# Patient Record
Sex: Female | Born: 1965
Health system: Southern US, Community
[De-identification: ages and names within clinical notes are randomized; demographics above are authoritative.]

## PROBLEM LIST (undated history)

## (undated) DIAGNOSIS — M25569 Pain in unspecified knee: Secondary | ICD-10-CM

## (undated) DIAGNOSIS — R0602 Shortness of breath: Secondary | ICD-10-CM

## (undated) DIAGNOSIS — K219 Gastro-esophageal reflux disease without esophagitis: Secondary | ICD-10-CM

## (undated) DIAGNOSIS — C4491 Basal cell carcinoma of skin, unspecified: Secondary | ICD-10-CM

## (undated) DIAGNOSIS — G4730 Sleep apnea, unspecified: Secondary | ICD-10-CM

## (undated) DIAGNOSIS — R600 Localized edema: Secondary | ICD-10-CM

## (undated) DIAGNOSIS — F419 Anxiety disorder, unspecified: Secondary | ICD-10-CM

## (undated) DIAGNOSIS — R011 Cardiac murmur, unspecified: Secondary | ICD-10-CM

## (undated) DIAGNOSIS — Z5189 Encounter for other specified aftercare: Secondary | ICD-10-CM

## (undated) DIAGNOSIS — D509 Iron deficiency anemia, unspecified: Secondary | ICD-10-CM

## (undated) DIAGNOSIS — I10 Essential (primary) hypertension: Secondary | ICD-10-CM

## (undated) DIAGNOSIS — E785 Hyperlipidemia, unspecified: Secondary | ICD-10-CM

## (undated) DIAGNOSIS — E559 Vitamin D deficiency, unspecified: Secondary | ICD-10-CM

## (undated) DIAGNOSIS — R569 Unspecified convulsions: Secondary | ICD-10-CM

## (undated) DIAGNOSIS — M549 Dorsalgia, unspecified: Secondary | ICD-10-CM

## (undated) DIAGNOSIS — C439 Malignant melanoma of skin, unspecified: Secondary | ICD-10-CM

## (undated) DIAGNOSIS — M2550 Pain in unspecified joint: Secondary | ICD-10-CM

## (undated) DIAGNOSIS — G2581 Restless legs syndrome: Secondary | ICD-10-CM

## (undated) DIAGNOSIS — E669 Obesity, unspecified: Secondary | ICD-10-CM

## (undated) DIAGNOSIS — M1990 Unspecified osteoarthritis, unspecified site: Secondary | ICD-10-CM

## (undated) DIAGNOSIS — T7840XA Allergy, unspecified, initial encounter: Secondary | ICD-10-CM

## (undated) DIAGNOSIS — K760 Fatty (change of) liver, not elsewhere classified: Secondary | ICD-10-CM

## (undated) DIAGNOSIS — F32A Depression, unspecified: Secondary | ICD-10-CM

## (undated) DIAGNOSIS — D649 Anemia, unspecified: Secondary | ICD-10-CM

## (undated) DIAGNOSIS — K829 Disease of gallbladder, unspecified: Secondary | ICD-10-CM

## (undated) DIAGNOSIS — R079 Chest pain, unspecified: Secondary | ICD-10-CM

## (undated) DIAGNOSIS — J45909 Unspecified asthma, uncomplicated: Secondary | ICD-10-CM

## (undated) DIAGNOSIS — F329 Major depressive disorder, single episode, unspecified: Secondary | ICD-10-CM

## (undated) HISTORY — PX: OTHER SURGICAL HISTORY: SHX169

## (undated) HISTORY — DX: Allergy, unspecified, initial encounter: T78.40XA

## (undated) HISTORY — DX: Pain in unspecified joint: M25.50

## (undated) HISTORY — PX: TONSILLECTOMY AND ADENOIDECTOMY: SUR1326

## (undated) HISTORY — DX: Depression, unspecified: F32.A

## (undated) HISTORY — DX: Cardiac murmur, unspecified: R01.1

## (undated) HISTORY — DX: Disease of gallbladder, unspecified: K82.9

## (undated) HISTORY — DX: Major depressive disorder, single episode, unspecified: F32.9

## (undated) HISTORY — DX: Chest pain, unspecified: R07.9

## (undated) HISTORY — DX: Shortness of breath: R06.02

## (undated) HISTORY — DX: Sleep apnea, unspecified: G47.30

## (undated) HISTORY — DX: Dorsalgia, unspecified: M54.9

## (undated) HISTORY — DX: Fatty (change of) liver, not elsewhere classified: K76.0

## (undated) HISTORY — DX: Iron deficiency anemia, unspecified: D50.9

## (undated) HISTORY — DX: Localized edema: R60.0

## (undated) HISTORY — DX: Encounter for other specified aftercare: Z51.89

## (undated) HISTORY — PX: UPPER GASTROINTESTINAL ENDOSCOPY: SHX188

## (undated) HISTORY — DX: Restless legs syndrome: G25.81

## (undated) HISTORY — DX: Basal cell carcinoma of skin, unspecified: C44.91

## (undated) HISTORY — DX: Malignant melanoma of skin, unspecified: C43.9

## (undated) HISTORY — DX: Essential (primary) hypertension: I10

## (undated) HISTORY — DX: Anxiety disorder, unspecified: F41.9

## (undated) HISTORY — DX: Anemia, unspecified: D64.9

## (undated) HISTORY — DX: Obesity, unspecified: E66.9

## (undated) HISTORY — DX: Unspecified asthma, uncomplicated: J45.909

## (undated) HISTORY — DX: Pain in unspecified knee: M25.569

## (undated) HISTORY — DX: Unspecified osteoarthritis, unspecified site: M19.90

## (undated) HISTORY — DX: Hyperlipidemia, unspecified: E78.5

## (undated) HISTORY — DX: Gastro-esophageal reflux disease without esophagitis: K21.9

## (undated) HISTORY — DX: Unspecified convulsions: R56.9

## (undated) HISTORY — PX: APPENDECTOMY: SHX54

## (undated) HISTORY — DX: Vitamin D deficiency, unspecified: E55.9

## (undated) HISTORY — PX: TUBAL LIGATION: SHX77

## (undated) HISTORY — PX: WISDOM TOOTH EXTRACTION: SHX21

---

## 2002-11-29 HISTORY — PX: BREAST ENHANCEMENT SURGERY: SHX7

## 2005-11-29 LAB — CONVERTED CEMR LAB

## 2007-02-03 ENCOUNTER — Emergency Department (HOSPITAL_COMMUNITY): Admission: EM | Admit: 2007-02-03 | Discharge: 2007-02-04 | Payer: Self-pay | Admitting: Emergency Medicine

## 2007-02-09 ENCOUNTER — Ambulatory Visit: Payer: Self-pay | Admitting: Family Medicine

## 2007-02-09 DIAGNOSIS — F5104 Psychophysiologic insomnia: Secondary | ICD-10-CM | POA: Insufficient documentation

## 2007-02-09 DIAGNOSIS — K5190 Ulcerative colitis, unspecified, without complications: Secondary | ICD-10-CM | POA: Insufficient documentation

## 2007-02-09 DIAGNOSIS — G4700 Insomnia, unspecified: Secondary | ICD-10-CM | POA: Insufficient documentation

## 2007-03-28 ENCOUNTER — Ambulatory Visit: Payer: Self-pay | Admitting: Family Medicine

## 2007-03-29 ENCOUNTER — Encounter: Payer: Self-pay | Admitting: Family Medicine

## 2007-03-29 LAB — CONVERTED CEMR LAB
ALT: 9 units/L (ref 0–35)
AST: 13 units/L (ref 0–37)
Albumin: 4.5 g/dL (ref 3.5–5.2)
Alkaline Phosphatase: 55 units/L (ref 39–117)
BUN: 13 mg/dL (ref 6–23)
Bilirubin Urine: NEGATIVE
CO2: 24 meq/L (ref 19–32)
Calcium: 9.6 mg/dL (ref 8.4–10.5)
Chloride: 104 meq/L (ref 96–112)
Creatinine, Ser: 0.79 mg/dL (ref 0.40–1.20)
Glucose, Bld: 80 mg/dL (ref 70–99)
Hemoglobin, Urine: NEGATIVE
Ketones, ur: NEGATIVE mg/dL
Leukocytes, UA: NEGATIVE
Nitrite: NEGATIVE
Potassium: 4.1 meq/L (ref 3.5–5.3)
Protein, ur: NEGATIVE mg/dL
Sodium: 139 meq/L (ref 135–145)
Specific Gravity, Urine: 1.007 (ref 1.005–1.03)
Total Bilirubin: 0.4 mg/dL (ref 0.3–1.2)
Total Protein: 7.4 g/dL (ref 6.0–8.3)
Urine Glucose: NEGATIVE mg/dL
Urobilinogen, UA: 0.2 (ref 0.0–1.0)
pH: 6 (ref 5.0–8.0)

## 2007-03-31 ENCOUNTER — Telehealth (INDEPENDENT_AMBULATORY_CARE_PROVIDER_SITE_OTHER): Payer: Self-pay | Admitting: *Deleted

## 2007-04-04 ENCOUNTER — Telehealth (INDEPENDENT_AMBULATORY_CARE_PROVIDER_SITE_OTHER): Payer: Self-pay | Admitting: *Deleted

## 2007-04-10 ENCOUNTER — Ambulatory Visit: Payer: Self-pay | Admitting: Family Medicine

## 2007-04-13 ENCOUNTER — Telehealth: Payer: Self-pay | Admitting: Family Medicine

## 2007-04-14 ENCOUNTER — Telehealth: Payer: Self-pay | Admitting: Family Medicine

## 2007-10-31 ENCOUNTER — Ambulatory Visit: Payer: Self-pay | Admitting: Family Medicine

## 2008-02-23 ENCOUNTER — Encounter: Admission: RE | Admit: 2008-02-23 | Discharge: 2008-02-23 | Payer: Self-pay | Admitting: Family Medicine

## 2008-03-04 ENCOUNTER — Encounter: Admission: RE | Admit: 2008-03-04 | Discharge: 2008-03-04 | Payer: Self-pay | Admitting: Family Medicine

## 2008-03-04 ENCOUNTER — Ambulatory Visit: Payer: Self-pay | Admitting: Family Medicine

## 2008-03-04 DIAGNOSIS — R5381 Other malaise: Secondary | ICD-10-CM | POA: Insufficient documentation

## 2008-03-04 DIAGNOSIS — R5383 Other fatigue: Secondary | ICD-10-CM

## 2008-03-05 ENCOUNTER — Encounter: Payer: Self-pay | Admitting: Family Medicine

## 2008-03-06 ENCOUNTER — Encounter: Payer: Self-pay | Admitting: Family Medicine

## 2008-03-06 DIAGNOSIS — E559 Vitamin D deficiency, unspecified: Secondary | ICD-10-CM | POA: Insufficient documentation

## 2008-03-06 DIAGNOSIS — E781 Pure hyperglyceridemia: Secondary | ICD-10-CM | POA: Insufficient documentation

## 2008-03-06 LAB — CONVERTED CEMR LAB
ALT: 13 units/L (ref 0–35)
AST: 13 units/L (ref 0–37)
Albumin: 4.2 g/dL (ref 3.5–5.2)
Alkaline Phosphatase: 52 units/L (ref 39–117)
BUN: 9 mg/dL (ref 6–23)
CO2: 23 meq/L (ref 19–32)
Calcium: 8.9 mg/dL (ref 8.4–10.5)
Chloride: 104 meq/L (ref 96–112)
Cholesterol: 185 mg/dL (ref 0–200)
Creatinine, Ser: 0.61 mg/dL (ref 0.40–1.20)
Glucose, Bld: 79 mg/dL (ref 70–99)
HCT: 44.7 % (ref 36.0–46.0)
HDL: 54 mg/dL (ref >39)
Hemoglobin: 14.8 g/dL (ref 12.0–15.0)
LDL Cholesterol: 87 mg/dL (ref 0–99)
MCHC: 33.1 g/dL (ref 30.0–36.0)
MCV: 93.7 fL (ref 78.0–100.0)
Platelets: 232 10*3/uL (ref 150–400)
Potassium: 4.2 meq/L (ref 3.5–5.3)
RBC: 4.77 M/uL (ref 3.87–5.11)
RDW: 13.5 % (ref 11.5–15.5)
Sed Rate: 4 mm/hr (ref 0–22)
Sodium: 140 meq/L (ref 135–145)
TSH: 2.269 microintl units/mL (ref 0.350–5.50)
Total Bilirubin: 0.4 mg/dL (ref 0.3–1.2)
Total CHOL/HDL Ratio: 3.4
Total Protein: 6.6 g/dL (ref 6.0–8.3)
Triglycerides: 218 mg/dL — ABNORMAL HIGH (ref <150)
VLDL: 44 mg/dL — ABNORMAL HIGH (ref 0–40)
Vit D, 1,25-Dihydroxy: 17 — ABNORMAL LOW (ref 30–89)
WBC: 5.4 10*3/uL (ref 4.0–10.5)

## 2008-03-14 ENCOUNTER — Ambulatory Visit: Payer: Self-pay | Admitting: Family Medicine

## 2008-03-14 DIAGNOSIS — C4490 Unspecified malignant neoplasm of skin, unspecified: Secondary | ICD-10-CM | POA: Insufficient documentation

## 2008-03-19 ENCOUNTER — Telehealth: Payer: Self-pay | Admitting: Family Medicine

## 2008-06-06 DIAGNOSIS — C4491 Basal cell carcinoma of skin, unspecified: Secondary | ICD-10-CM

## 2008-06-06 HISTORY — DX: Basal cell carcinoma of skin, unspecified: C44.91

## 2008-07-10 ENCOUNTER — Encounter: Payer: Self-pay | Admitting: Family Medicine

## 2008-09-04 ENCOUNTER — Ambulatory Visit: Payer: Self-pay | Admitting: Family Medicine

## 2008-09-04 ENCOUNTER — Encounter: Admission: RE | Admit: 2008-09-04 | Discharge: 2008-09-04 | Payer: Self-pay | Admitting: Family Medicine

## 2008-09-05 ENCOUNTER — Encounter: Payer: Self-pay | Admitting: Family Medicine

## 2008-09-05 LAB — CONVERTED CEMR LAB
ALT: 11 units/L (ref 0–35)
AST: 12 units/L (ref 0–37)
Albumin: 4.2 g/dL (ref 3.5–5.2)
Alkaline Phosphatase: 58 units/L (ref 39–117)
BUN: 11 mg/dL (ref 6–23)
Basophils Absolute: 0 10*3/uL (ref 0.0–0.1)
Basophils Relative: 1 % (ref 0–1)
CO2: 24 meq/L (ref 19–32)
Calcium: 9.4 mg/dL (ref 8.4–10.5)
Chloride: 103 meq/L (ref 96–112)
Cortisol, Plasma: 4.6 ug/dL
Creatinine, Ser: 0.65 mg/dL (ref 0.40–1.20)
Eosinophils Absolute: 0.1 10*3/uL (ref 0.0–0.7)
Eosinophils Relative: 2 % (ref 0–5)
Folate: 10.9 ng/mL
Glucose, Bld: 96 mg/dL (ref 70–99)
HCT: 42.7 % (ref 36.0–46.0)
Hemoglobin: 14.2 g/dL (ref 12.0–15.0)
Lymphocytes Relative: 30 % (ref 12–46)
Lymphs Abs: 2.2 10*3/uL (ref 0.7–4.0)
MCHC: 33.3 g/dL (ref 30.0–36.0)
MCV: 93.2 fL (ref 78.0–100.0)
Monocytes Absolute: 0.5 10*3/uL (ref 0.1–1.0)
Monocytes Relative: 7 % (ref 3–12)
Neutro Abs: 4.3 10*3/uL (ref 1.7–7.7)
Neutrophils Relative %: 61 % (ref 43–77)
Platelets: 251 10*3/uL (ref 150–400)
Potassium: 4.3 meq/L (ref 3.5–5.3)
RBC: 4.58 M/uL (ref 3.87–5.11)
RDW: 13.5 % (ref 11.5–15.5)
Sed Rate: 8 mm/hr (ref 0–22)
Sodium: 140 meq/L (ref 135–145)
TSH: 1.3 microintl units/mL (ref 0.350–4.50)
Total Bilirubin: 0.4 mg/dL (ref 0.3–1.2)
Total Protein: 6.8 g/dL (ref 6.0–8.3)
Vit D, 1,25-Dihydroxy: 37 (ref 30–89)
Vitamin B-12: 1108 pg/mL — ABNORMAL HIGH (ref 211–911)
WBC: 7.1 10*3/uL (ref 4.0–10.5)

## 2009-09-04 ENCOUNTER — Encounter: Payer: Self-pay | Admitting: Family Medicine

## 2009-09-04 ENCOUNTER — Other Ambulatory Visit: Admission: RE | Admit: 2009-09-04 | Discharge: 2009-09-04 | Payer: Self-pay | Admitting: Family Medicine

## 2009-09-04 ENCOUNTER — Ambulatory Visit: Payer: Self-pay | Admitting: Family Medicine

## 2009-09-05 LAB — CONVERTED CEMR LAB
ALT: 9 units/L (ref 0–35)
AST: 11 units/L (ref 0–37)
Albumin: 4.4 g/dL (ref 3.5–5.2)
Alkaline Phosphatase: 52 units/L (ref 39–117)
BUN: 10 mg/dL (ref 6–23)
CO2: 21 meq/L (ref 19–32)
Calcium: 9 mg/dL (ref 8.4–10.5)
Chloride: 105 meq/L (ref 96–112)
Cholesterol: 167 mg/dL (ref 0–200)
Creatinine, Ser: 0.7 mg/dL (ref 0.40–1.20)
Glucose, Bld: 95 mg/dL (ref 70–99)
HDL: 53 mg/dL (ref >39)
LDL Cholesterol: 100 mg/dL — ABNORMAL HIGH (ref 0–99)
Potassium: 3.8 meq/L (ref 3.5–5.3)
Sodium: 138 meq/L (ref 135–145)
Total Bilirubin: 0.3 mg/dL (ref 0.3–1.2)
Total CHOL/HDL Ratio: 3.2
Total Protein: 6.9 g/dL (ref 6.0–8.3)
Triglycerides: 71 mg/dL (ref <150)
VLDL: 14 mg/dL (ref 0–40)

## 2009-09-08 LAB — CONVERTED CEMR LAB: Pap Smear: NORMAL

## 2009-11-10 ENCOUNTER — Ambulatory Visit: Payer: Self-pay | Admitting: Family Medicine

## 2009-11-10 ENCOUNTER — Inpatient Hospital Stay (HOSPITAL_COMMUNITY): Admission: EM | Admit: 2009-11-10 | Discharge: 2009-11-11 | Payer: Self-pay | Admitting: Emergency Medicine

## 2009-11-10 ENCOUNTER — Encounter: Payer: Self-pay | Admitting: Family Medicine

## 2009-11-10 DIAGNOSIS — R42 Dizziness and giddiness: Secondary | ICD-10-CM | POA: Insufficient documentation

## 2009-11-10 DIAGNOSIS — R079 Chest pain, unspecified: Secondary | ICD-10-CM | POA: Insufficient documentation

## 2009-11-12 ENCOUNTER — Telehealth: Payer: Self-pay | Admitting: Family Medicine

## 2009-11-18 ENCOUNTER — Ambulatory Visit: Payer: Self-pay | Admitting: Family Medicine

## 2009-11-18 DIAGNOSIS — R071 Chest pain on breathing: Secondary | ICD-10-CM | POA: Insufficient documentation

## 2009-11-19 ENCOUNTER — Ambulatory Visit: Payer: Self-pay | Admitting: Family Medicine

## 2009-11-19 ENCOUNTER — Encounter (INDEPENDENT_AMBULATORY_CARE_PROVIDER_SITE_OTHER): Payer: Self-pay | Admitting: *Deleted

## 2009-11-19 ENCOUNTER — Telehealth: Payer: Self-pay | Admitting: Gastroenterology

## 2009-11-19 ENCOUNTER — Telehealth: Payer: Self-pay | Admitting: Family Medicine

## 2009-11-19 ENCOUNTER — Ambulatory Visit: Payer: Self-pay | Admitting: Gastroenterology

## 2009-11-29 HISTORY — PX: CHOLECYSTECTOMY: SHX55

## 2009-12-05 ENCOUNTER — Ambulatory Visit: Payer: Self-pay | Admitting: Gastroenterology

## 2009-12-08 ENCOUNTER — Encounter (INDEPENDENT_AMBULATORY_CARE_PROVIDER_SITE_OTHER): Payer: Self-pay

## 2009-12-09 ENCOUNTER — Encounter: Payer: Self-pay | Admitting: Gastroenterology

## 2009-12-31 ENCOUNTER — Ambulatory Visit: Payer: Self-pay | Admitting: Gastroenterology

## 2009-12-31 DIAGNOSIS — K219 Gastro-esophageal reflux disease without esophagitis: Secondary | ICD-10-CM | POA: Insufficient documentation

## 2010-01-06 ENCOUNTER — Telehealth: Payer: Self-pay | Admitting: Gastroenterology

## 2010-02-11 ENCOUNTER — Ambulatory Visit: Payer: Self-pay | Admitting: Family Medicine

## 2010-02-13 ENCOUNTER — Ambulatory Visit: Payer: Self-pay | Admitting: Family Medicine

## 2010-07-14 ENCOUNTER — Ambulatory Visit: Payer: Self-pay | Admitting: Family Medicine

## 2010-07-14 DIAGNOSIS — N760 Acute vaginitis: Secondary | ICD-10-CM | POA: Insufficient documentation

## 2010-07-14 DIAGNOSIS — M5412 Radiculopathy, cervical region: Secondary | ICD-10-CM | POA: Insufficient documentation

## 2010-07-14 DIAGNOSIS — D239 Other benign neoplasm of skin, unspecified: Secondary | ICD-10-CM

## 2010-07-14 HISTORY — DX: Other benign neoplasm of skin, unspecified: D23.9

## 2010-07-14 LAB — CONVERTED CEMR LAB
Bilirubin Urine: NEGATIVE
Glucose, Urine, Semiquant: NEGATIVE
Ketones, urine, test strip: NEGATIVE
Nitrite: NEGATIVE
Protein, U semiquant: NEGATIVE
Specific Gravity, Urine: <1.005
Urobilinogen, UA: 0.2
WBC Urine, dipstick: NEGATIVE
pH: 6

## 2010-07-15 ENCOUNTER — Encounter: Payer: Self-pay | Admitting: Family Medicine

## 2010-07-15 LAB — CONVERTED CEMR LAB
Trich, Wet Prep: NONE SEEN
Yeast Wet Prep HPF POC: NONE SEEN

## 2010-09-08 ENCOUNTER — Ambulatory Visit (HOSPITAL_COMMUNITY): Admission: RE | Admit: 2010-09-08 | Discharge: 2010-09-08 | Payer: Self-pay | Admitting: Emergency Medicine

## 2010-09-28 ENCOUNTER — Encounter: Payer: Self-pay | Admitting: Family Medicine

## 2010-10-16 ENCOUNTER — Ambulatory Visit (HOSPITAL_COMMUNITY): Admission: RE | Admit: 2010-10-16 | Discharge: 2010-10-16 | Payer: Self-pay | Admitting: General Surgery

## 2010-12-20 ENCOUNTER — Encounter: Payer: Self-pay | Admitting: Family Medicine

## 2010-12-29 NOTE — Assessment & Plan Note (Signed)
Summary: Vaginitis, etc   Vital Signs:  Patient profile:   45 year old female Height:      65 inches Weight:      183 pounds Pulse rate:   93 / minute BP sitting:   123 / 82  (left arm) Cuff size:   regular  Vitals Entered By: Isaias Cowman CMA, Deborra Medina) (July 14, 2010 10:01 AM) CC: vaginal odor x 1 month,small amount of discharge, [place]ching [place] and concerned about a yeast infection,lesion on the inside of nose that comes and goes   Primary Care Jakia Kennebrew:  Beatrice Lecher, MD  CC:  vaginal odor x 1 month, small amount of discharge, no itching [place] days and concerned about a yeast infection, and lesion on the inside of nose that comes and goes.  History of Present Illness: vaginal odor x 1 month,small amount of discharge, no itching [place] days and concerned about a yeast infection,lesion on the inside of nose that comes and goes. No irritation or itching, no rash.  No fever.    Has a lesion on her right upper hairline. Hasnt really changed but wanted me to look at it. Does have a hx of basal cell carcinomoa on her back.   Noticed a lump on the left side of her neck neck her ear a few days ago. Says it is tender.  No trauma or recent infection. Wants me to look at it.   Current Medications (verified): 1)  Sudafed 30 Mg Tabs (Pseudoephedrine Hcl) .... Take One By Mouth As Needed 2)  Ibuprofen 200 Mg Tabs (Ibuprofen) .... Take Four Tabs By Mouth As Needed 3)  Multivitamins  Tabs (Multiple Vitamin) .Marland Kitchen.. 1 Tablet By Mouth Once Daily 4)  Dexilant 60 Mg Cpdr (Dexlansoprazole) .... One Tablet By Mouth Once Daily 5)  Proair Hfa 108 (90 Base) Mcg/act  Aers (Albuterol Sulfate) .... 2 Inh Q4h As Needed Shortness of Breath  Allergies (verified): 1)  ! Tagamet  Comments:  Nurse/Medical  Assistant: The patient's medications and allergies were reviewed with the patient and were updated in the Medication and Allergy Lists. Raymond, Deborra Medina) (July 14, 2010 10:10 AM)  Past History:  Past Medical History: Ulcerative Colitis diagnosed at age 2 Basal cell skin Cancer Spirometry 10/2009: FVC 99%, FEV1 98%, ratio 83. No response to albuterol.  HX gastric ulcer GERD  Physical Exam  General:  Well-developed,well-nourished,in no acute distress; alert,appropriate and cooperative throughout examination Head:  Normocephalic and atraumatic without obvious abnormalities. No apparent alopecia or balding. Msk:  eck with NROM and she is mildy tender over the upper portion of teh sternocletomastod.  Pulses:  Radial 2+  Skin:  Leion on the hairline is a blu papular lesion. No enlarge poor. It is dry and some crusting.    Impression & Recommendations:  Problem # 1:  VAGINITIS (ICD-616.10)  Wet prep preformed. Likely BV based on exam. UA only shows trace blood. No indication to treat or send for culture.    Orders: UA Dipstick w/o Micro (automated)  (81003) T-Wet Prep for Melissa Cohen, [facility] [phone])  Problem # 2:  NEVUS, ATYPICAL (ICD-216.9) discuss recommend schedule for a bx.   Problem # 3:  NECK PAIN, LEFT (ICD-723.1) Likely starin of the sternocletomastoid.  recommend stretches and NSAID prn .  Her updated medication list for this problem includes:    Ibuprofen 200 Mg Tabs (Ibuprofen) .Marland Kitchen... Take four tabs by mouth as needed  Complete Medication List: 1)  Sudafed 30 Mg Tabs (Pseudoephedrine hcl) .... Take one by mouth as needed 2)  Ibuprofen 200 Mg Tabs (Ibuprofen) .... Take four tabs by mouth as needed 3)  Multivitamins Tabs (Multiple vitamin) .Marland Kitchen.. 1 tablet by mouth once daily 4)  Dexilant 60 Mg Cpdr (Dexlansoprazole) .... One tablet by mouth once daily 5)  Proair Hfa 108 (90 Base) Mcg/act Aers (Albuterol sulfate) .... 2 inh q4h as needed shortness  of breath  Laboratory Results   Urine Tests  Date/Time Received: 07/14/2010 Date/Time Reported: 07/14/2010  Routine Urinalysis   Color: yellow Appearance: Clear Glucose: negative   (Normal Range: Negative) Bilirubin: negative   (Normal Range: Negative) Ketone: negative   (Normal Range: Negative) Spec. Gravity: <1.005   (Normal Range: 1.003-1.035) Blood: trace-intact   (Normal Range: Negative) pH: 6.0   (Normal Range: 5.0-8.0) Protein: negative   (Normal Range: Negative) Urobilinogen: 0.2   (Normal Range: 0-1) Nitrite: negative   (Normal Range: Negative) Leukocyte Esterace: negative   (Normal Range: Negative)

## 2010-12-29 NOTE — Procedures (Signed)
Summary: Colonoscopy  Patient: Melissa Cohen Note: All result statuses are Final unless otherwise noted.  Tests: (1) Colonoscopy (COL)   COL Colonoscopy           [facility].     [place], [place]  [id number]           COLONOSCOPY PROCEDURE REPORT           PATIENT:  Melissa Cohen, Melissa Cohen  MR#:  [id number]     BIRTHDATE:  [birth year], 43 yrs. old  GENDER:  female           ENDOSCOPIST:  Norberto Sorenson T. Fuller Plan, MD, [facility]           PROCEDURE DATE:  12/05/2009     PROCEDURE:  Colonoscopy with biopsy and snare polypectomy     ASA CLASS:  Class II     INDICATIONS:  1) evaluation of longstanding ulcerative colitis           MEDICATIONS:   Fentanyl 75 mcg IV, Versed 10 mg IV           DESCRIPTION OF PROCEDURE:   After the risks benefits and     alternatives of the procedure were thoroughly explained, informed     consent was obtained.  Digital rectal exam was performed and     revealed no abnormalities.   The LB [id number]-[id number] endoscope     was introduced through the anus and advanced to the terminal ileum     which was intubated for a short distance, without limitations.     The quality of the prep was good, using MoviPrep.  The instrument     was then slowly withdrawn as the colon was fully examined.     <<PROCEDUREIMAGES>>           FINDINGS:  There were mucosal changes consistent with mild     universal ulcerative colitis throughout the colon. They were     patchy, granular and erythematous. Several areas with scarring and     loss of vacular pattern. Random biopsies were obtained every 10 cm     throughout the colon and sent to pathology.  There were     inflammatory changes in the ileum. They were erosive. R/O backwash     ileitis. Multiple biopsies were obtained and sent to pathology.     There were multiple polyps (>20) identified and the largest 6 were     removed in the rectum and sigmoid colon. They were 3 - 7 mm in     size. All of the  polyps had typical feartures of pseudopolyps     except for one which had adenomatous feaures and measured 68m and     this polyp was removed. Polyps were snared without cautery.     Retrieval was successful. Retroflexed views in the rectum revealed     no abnormalities. The time to cecum =  2.75  minutes. The scope     was then withdrawn (time =  13.5  min) from the patient and the     procedure completed.           COMPLICATIONS:  None           ENDOSCOPIC IMPRESSION:     1) Mild universal UC     2) Ileitis     3) 3 - 7 mm polyps, multiple in the rectum and sigmoid colon,  all except one appeared to be pseudopolyps           RECOMMENDATIONS:     1) Await pathology results     2) follow-up: GI Clinic 2 - 4 weeks: review biospies and discuss     5-ASA therapy     3) Repeat Colonoscopy in 2 years for UC surveillance unless     biopies indicate otherwise           Mikailah Morel T. Fuller Plan, MD, [facility]           CC: Beatrice Lecher, MD           n.     Lorrin MaisPricilla Riffle. [place] Graefe at 12/05/2009 03:56 PM           Delane Ginger, [phone]  Note: An exclamation mark (!) indicates a result that was not dispersed into the flowsheet. Document Creation Date: 12/05/2009 3:57 PM _______________________________________________________________________  (1) Order result status: Final Collection or observation date-time: 12/05/2009 15:44 Requested date-time:  Receipt date-time:  Reported date-time:  Referring Physician:   Ordering Physician: Lucio Edward [phone]) Specimen Source:  Source: Tawanna Cooler Order Number: [id number] Lab site:   Appended Document: Colonoscopy     Procedures Next Due Date:    Colonoscopy: 11/2011

## 2010-12-29 NOTE — Assessment & Plan Note (Signed)
Summary: Bronchitis, acute    Vital Signs:  Patient profile:   45 year old female Height:      65 inches Weight:      181 pounds O2 Sat:      98 % on Room air Temp:     99.0 degrees F oral Pulse rate:   82 / minute BP sitting:   104 / 68  (left arm) Cuff size:   regular  Vitals Entered By: Geanie Kenning (February 13, 2010 10:42 AM)  O2 Flow:  Room air  Serial Vital Signs/Assessments:                                PEF    PreRx  PostRx Time      O2 Sat  O2 Type     L/min  L/min  L/min   By 10:56 AM                      340    300    350     Kim Johnson  Comments: 10:56 AM pt in green zone By: Geanie Kenning   CC: cough, drainage, H/A- seen in UC Wed. night dx with bronchitis- given Zpak, cough med and inhaler- on 3rd day of antibiotics- states does feel some better   Primary Care Provider:  Beatrice Lecher, MD  CC:  cough, drainage, H/A- seen in UC Wed. night dx with bronchitis- given Zpak, and cough med and inhaler- on 3rd day of antibiotics- states does feel some better.  History of Present Illness: cough, drainage, H/A- seen in UC Wed. night dx with bronchitis- given Zpak, cough med and inhaler- on 3rd day of antibiotics- states does feel some better. Sx startedd 6 days ago. Nasal drainage and productive cough.  Low grade fever earlier on.  Throat feels better.  Yesterday did go to work.  Woke up coughing this AM and felt like she ws choking. Was also given a steroid and NEB treatment at [facility].   Current Medications (verified): 1)  Sudafed 30 Mg Tabs (Pseudoephedrine Hcl) .... Take One By Mouth As Needed 2)  Ibuprofen 200 Mg Tabs (Ibuprofen) .... Take Four Tabs By Mouth As Needed 3)  Multivitamins  Tabs (Multiple Vitamin) .Marland Kitchen.. 1 Tablet By Mouth Once Daily 4)  Dexilant 60 Mg Cpdr (Dexlansoprazole) .... One Tablet By Mouth Once Daily 5)  Lialda 1.2 Gm Tbec (Mesalamine) .... 2 Tablets By Mouth Every Morning 6)  Zithromax Z-Pak 250 Mg  Tabs (Azithromycin) .... Use As Directed 7)   Tussionex Pennkinetic Er 8-10 Mg/59m Lqcr (Chlorpheniramine-Hydrocodone) ..Marland Kitchen. 1 Tsp By Mouth Twice A Day As Needed For Cough Only Filled If Covered or Affordable 8)  Proair Hfa 108 (90 Base) Mcg/act  Aers (Albuterol Sulfate) .... 2 Inh Q4h As Needed Shortness of Breath  Allergies (verified): 1)  ! Tagamet  Comments:  Nurse/Medical Assistant: The patient's medications and allergies were reviewed with the patient and were updated in the Medication and Allergy Lists. KGeanie Kenning(February 13, 2010 10:44 AM)  Physical Exam  General:  Well-developed,well-nourished,in no acute distress; alert,appropriate and cooperative throughout examination Head:  Normocephalic and atraumatic without obvious abnormalities. No apparent alopecia or balding. Eyes:  No corneal or conjunctival inflammation noted. EOMI. Perrla. Ears:  External ear exam shows no significant lesions or deformities.  Otoscopic examination reveals clear canals, tympanic membranes are intact bilaterally without bulging, retraction, inflammation or discharge.  Hearing is grossly normal bilaterally. Nose:  External nasal examination shows no deformity or inflammation. Nasal mucosa are pink and moist without lesions or exudates. Mouth:  Oral mucosa and oropharynx without lesions or exudates.  Teeth in good repair. Neck:  No deformities, masses, or tenderness noted. Lungs:  Normal respiratory effort, chest expands symmetrically. Lungs are clear to auscultation, no crackles or wheezes. Heart:  Normal rate and regular rhythm. S1 and S2 normal without gallop, murmur, click, rub or other extra sounds. Pulses:  Radial 2+  Skin:  no rashes.  no rashes.   Cervical Nodes:  No lymphadenopathy noted Psych:  Cognition and judgment appear intact. Alert and cooperative with normal attention span and concentration. No apparent delusions, illusions, hallucinations   Impression & Recommendations:  Problem # 1:  BRONCHITIS, ACUTE WITH BRONCHOSPASM  (ICD-466.0) Gave her reassurance that I really think she is getting better and will need to give this a few more days.  CAll if not better into next week.  Complete ABX.  Her updated medication list for this problem includes:    Zithromax Z-pak 250 Mg Tabs (Azithromycin) ..... Use as directed    Tussionex Pennkinetic Er 8-10 Mg/53m Lqcr (Chlorpheniramine-hydrocodone) ..Marland Kitchen.. 1 tsp by mouth twice a day as needed for cough only filled if covered or affordable    Proair Hfa 108 (90 Base) Mcg/act Aers (Albuterol sulfate) ..Marland Kitchen.. 2 inh q4h as needed shortness of breath  Complete Medication List: 1)  Sudafed 30 Mg Tabs (Pseudoephedrine hcl) .... Take one by mouth as needed 2)  Ibuprofen 200 Mg Tabs (Ibuprofen) .... Take four tabs by mouth as needed 3)  Multivitamins Tabs (Multiple vitamin) ..Marland Kitchen. 1 tablet by mouth once daily 4)  Dexilant 60 Mg Cpdr (Dexlansoprazole) .... One tablet by mouth once daily 5)  Lialda 1.2 Gm Tbec (Mesalamine) .... 2 tablets by mouth every morning 6)  Zithromax Z-pak 250 Mg Tabs (Azithromycin) .... Use as directed 7)  Tussionex Pennkinetic Er 8-10 Mg/556mLqcr (Chlorpheniramine-hydrocodone) ...Marland Kitchen 1 tsp by mouth twice a day as needed for cough only filled if covered or affordable 8)  Proair Hfa 108 (90 Base) Mcg/act Aers (Albuterol sulfate) .... 2 inh q4h as needed shortness of breath

## 2010-12-29 NOTE — Letter (Signed)
Summary: [facility]   Imported By: Laural Benes 11/05/2010 15:05:27  _____________________________________________________________________  External Attachment:    Type:   Image     Comment:   External Document

## 2010-12-29 NOTE — Letter (Signed)
Summary: Out of Work  [facility]  [address]   [place], [place] [address]   Phone: [phone]  Fax: [phone]    February 11, 2010   Employee:  Deziya Song    To Whom It May Concern:   For Medical reasons, please excuse the above named employee from work for the following dates:  Start:   02/11/2010  Return:   02/13/2010  If you need additional information, please feel free to contact our office.         Sincerely,    Frederich Cha MD

## 2010-12-29 NOTE — Assessment & Plan Note (Signed)
Summary: cough-dry, hoarseness x 2 dys rm 2   Vital Signs:  Patient Profile:   45 Years Old Female CC:      Cold & URI symptoms Height:     65 inches Weight:      179 pounds O2 Sat:      99 % O2 treatment:    Room Air Temp:     98.9 degrees F oral Pulse rate:   89 / minute Pulse rhythm:   regular Resp:     18 per minute BP sitting:   113 / 78  (right arm) Cuff size:   regular  Vitals Entered By: Tommas Olp CMA (February 11, 2010 7:00 PM)                  Prior Medication List:  SUDAFED 30 MG TABS (PSEUDOEPHEDRINE HCL) take one by mouth as needed IBUPROFEN 200 MG TABS (IBUPROFEN) take four tabs by mouth as needed MULTIVITAMINS  TABS (MULTIPLE VITAMIN) 1 tablet by mouth once daily DEXILANT 60 MG CPDR (DEXLANSOPRAZOLE) one tablet by mouth once daily LIALDA 1.2 GM TBEC (MESALAMINE) 2 tablets by mouth every morning   Current Allergies: ! TAGAMET    History of Present Illness Chief Complaint: Cold & URI symptoms History of Present Illness: Hoarsness now for 3 days. Progressively worse. when she coughs she does have chest discomfort but denies any wheezing. he denies any fever at home.  Current Problems: BRONCHITIS, ACUTE WITH BRONCHOSPASM (ICD-466.0) GERD (ICD-530.81) CHEST PAIN, PLEURITIC (ICD-786.52) CHEST PAIN (ICD-786.50) DIZZINESS (ICD-780.4) CARCINOMA, BASAL CELL (ICD-173.9) HYPERTRIGLYCERIDEMIA (ICD-272.1) VITAMIN D DEFICIENCY (ICD-268.9) FATIGUE (ICD-780.79) INSOMNIA (ICD-780.52) ULCERATIVE COLITIS (ICD-556.9)   Current Meds SUDAFED 30 MG TABS (PSEUDOEPHEDRINE HCL) take one by mouth as needed IBUPROFEN 200 MG TABS (IBUPROFEN) take four tabs by mouth as needed MULTIVITAMINS  TABS (MULTIPLE VITAMIN) 1 tablet by mouth once daily DEXILANT 60 MG CPDR (DEXLANSOPRAZOLE) one tablet by mouth once daily LIALDA 1.2 GM TBEC (MESALAMINE) 2 tablets by mouth every morning ZITHROMAX Z-PAK 250 MG  TABS (AZITHROMYCIN) Use as directed TUSSIONEX PENNKINETIC ER 8-10  MG/5ML LQCR (CHLORPHENIRAMINE-HYDROCODONE) 1 tsp by mouth twice a day as needed for cough only filled if covered or affordable PROAIR HFA 108 (90 BASE) MCG/ACT  AERS (ALBUTEROL SULFATE) 2 inh q4h as needed shortness of breath  REVIEW OF SYSTEMS Constitutional Symptoms      Denies fever, chills, night sweats, weight loss, weight gain, and fatigue.  Eyes       Denies change in vision, eye pain, eye discharge, glasses, contact lenses, and eye surgery. Ear/Nose/Throat/Mouth       Complains of hoarseness.      Denies hearing loss/aids, change in hearing, ear pain, ear discharge, dizziness, frequent runny nose, frequent nose bleeds, sinus problems, sore throat, and tooth pain or bleeding.  Respiratory       Complains of dry cough and wheezing.      Denies productive cough, shortness of breath, asthma, bronchitis, and emphysema/COPD.  Cardiovascular       Denies murmurs, chest pain, and tires easily with exhertion.    Gastrointestinal       Denies stomach pain, nausea/vomiting, diarrhea, constipation, blood in bowel movements, and indigestion. Genitourniary       Denies painful urination, kidney stones, and loss of urinary control. Neurological       Denies paralysis, seizures, and fainting/blackouts. Musculoskeletal       Denies muscle pain, joint pain, joint stiffness, decreased range of motion, redness, swelling, muscle weakness, and gout.  Skin       Denies bruising, unusual mles/lumps or sores, and hair/skin or nail changes.  Psych       Denies mood changes, temper/anger issues, anxiety/stress, speech problems, depression, and sleep problems. Other Comments: x 2 dys. Pt has not seen PCP for this.   Past History:  Past Medical History: Last updated: 12/31/2009 CHEST PAIN, PLEURITIC (ICD-786.52) HYPERTRIGLYCERIDEMIA (ICD-272.1) VITAMIN D DEFICIENCY (ICD-268.9) INSOMNIA (ICD-780.52) Ulcerative Colitis diagnosed at age 30 Basal cell skin Cancer Spirometry 10/2009: FVC 99%, FEV1 98%,  ratio 83. No response to albuterol.  HX gastric ulcer GERD  Past Surgical History: Last updated: 02/09/2007 Appendectomy age 63 Uterine Ablation age 57 Tubal ligaton age 58 Breast lift and aug 2004 T & A age 61  Family History: Last updated: 11/19/2009 Father w/ prostate Ca Sister w/ DM Dad w/ cholesterol Granparents with DM MGF with MI at 53s, smokeer.  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  Some college. Married to [place] w/ 3 kids.  1 kid stills live at home.   Former Smoker (25 years about 1/2 ppd on average).  quit in 2007 Alcohol use-yes every other weekend socially  Drug use-no Regular exercise-no  Risk Factors: Alcohol Use: 1 (02/09/2007) Caffeine Use: 2 (02/09/2007) Exercise: yes (02/09/2007)  Risk Factors: Smoking Status: quit (02/09/2007) Physical Exam General appearance: well developed, well nourished,mild distress Head: normocephalic, atraumatic Oral/Pharynx: tongue normal, posterior pharynx without erythema or exudate Neck: neck supple,  trachea midline, no masses Chest/Lungs: scattered rhonchi Heart: regular rate and  rhythm, no murmur Skin: no obvious rashes or lesions MSE: oriented to time, place, and person Assessment New Problems: BRONCHITIS, ACUTE WITH BRONCHOSPASM (ICD-466.0)  bronchitis  Patient Education: Patient and/or caregiver instructed in the following: rest fluids and Tylenol.  Plan New Medications/Changes: Cathie Hoops ER 8-10 MG/5ML LQCR (CHLORPHENIRAMINE-HYDROCODONE) 1 tsp by mouth twice a day as needed for cough only filled if covered or affordable  #63f oz x 0, 02/11/2010, EFrederich ChaMD PROAIR HFA 108 (90 BASE) MCG/ACT  AERS (ALBUTEROL SULFATE) 2 inh q4h as needed shortness of breath  #1  x 0, 02/11/2010, EFrederich ChaMD ZITHROMAX Z-PAK 250 MG  TABS (AZITHROMYCIN) Use as directed  #1 x 0, 02/11/2010, EFrederich ChaMD  New Orders: New Patient Level III [phone] Tx [phone] up to 1256m[J2930] Albuterol Sulfate Sol 78m70mnit dose [J7613] Ipratropium inhalation sol. unit dose [J7[Y1856]anning Comments:   as below  Follow Up: Follow up in 2-3 days if no improvement, Follow up on an as needed basis, Follow up with Primary Physician Work/School Excuse: Return to work/school in 2 days  The patient and/or caregiver has been counseled thoroughly with regard to medications prescribed including dosage, schedule, interactions, rationale for use, and possible side effects and they verbalize understanding.  Diagnoses and expected course of recovery discussed and will return if not improved as expected or if the condition worsens. Patient and/or caregiver verbalized understanding.  Prescriptions: TUSSIONEX PENNKINETIC ER 8-10 MG/5ML LQCR (CHLORPHENIRAMINE-HYDROCODONE) 1 tsp by mouth twice a day as needed for cough only filled if covered or affordable  #4fl87f x 0   Entered and Authorized by:   EugeFrederich Cha  Signed by:   EugeFrederich Chaon 02/11/2010   Method used:   Printed then faxed  to ...       [facility] (retail)       [place], [place]  [address]       Ph: [phone]       Fax: [phone]   RxID:   [id number] PROAIR HFA 108 (90 BASE) MCG/ACT  AERS (ALBUTEROL SULFATE) 2 inh q4h as needed shortness of breath  #1 x 0   Entered and Authorized by:   Frederich Cha MD   Signed by:   Frederich Cha MD on 02/11/2010   Method used:   Printed then faxed to ...       [facility] (retail)       [place], [place]  [address]       Ph: [phone]       Fax: [phone]   RxID:   [id number] ZITHROMAX Z-PAK 250 MG  TABS (AZITHROMYCIN) Use as directed  #1 x 0   Entered and Authorized by:   Frederich Cha MD   Signed by:    Frederich Cha MD on 02/11/2010   Method used:   Printed then faxed to ...       [facility] (retail)       [place], [place]  [address]       Ph: [phone]       Fax: [phone]   RxID:   [id number]   Patient Instructions: 1)  Please schedule a follow-up appointment as needed. 2)  Please schedule an appointment with your primary doctor in :2-7 days if needed 3)  Take your antibiotic as prescribed until ALL of it is gone, but stop if you develop a rash or swelling and contact our office as soon as possible. 4)  Acute bronchitis symptoms for less than 10 days are not helped by antibiotics. take over the counter cough medications. call if no improvment in  5-7 days, sooner if increasing cough, fever, or new symptoms( shortness of breath, chest pain). 5)  Recommended remaining out of work for tomorrow and return to work on 03//18/2011   Medication Administration  Injection # 1:    Medication: Solumedrol up to 160m    Diagnosis: BRONCHITIS, ACUTE WITH BRONCHOSPASM (ICD-466.0)    Route: IM    Site: RUOQ gluteus    Exp Date: 09/31/2013    Lot #: [id number]   Mfr: Pharmacia    Patient tolerated injection without complications    Given by: MTommas OlpCMA (February 11, 2010 7:52 PM)  Medication # 1:    Medication: Albuterol Sulfate Sol 126munit dose    Diagnosis: BRONCHITIS, ACUTE WITH BRONCHOSPASM (ICD-466.0)    Route: inhaled    Exp Date: 07/30/2011    Lot #: [id number]  Mfr: Nephron    Patient tolerated medication without complications    Given by: MiTommas OlpMA (February 11, 2010 7:54 PM)  Medication # 2:    Medication: Ipratropium inhalation sol. unit dose    Diagnosis: BRONCHITIS, ACUTE WITH BRONCHOSPASM (ICD-466.0)    Route: inhaled    Exp Date: 03/28/2010    Lot #: [id number]  Mfr: Nephron    Patient tolerated medication without complications    Given by: MiTommas OlpMA (February 11, 2010 7:55 PM)  Orders Added: 1)  New Patient  Level III [9[81157])  Nebulizer Tx [[id number]  3)  Solumedrol up to 123m [J2930] 4)  Albuterol Sulfate Sol 181munit dose [J7613] 5)  Ipratropium inhalation sol. unit dose [J[Y2026]

## 2010-12-29 NOTE — Letter (Signed)
Summary: Patient Notice- Colon Biospy Results  [facility]  [place], [place] [address]   Phone: [phone]  Fax: [phone]        December 09, 2009 MRN: [id number]    [facility] [address] [place], [place]  [address]    Dear Ms. Salley,  I am pleased to inform you that the biopsies taken during your recent colonoscopy did not show any evidence of cancer upon pathologic examination. The biopsies showed chronic colitis.   Please call [phone] to schedule a return visit to review      your condition.  Continue with the treatment plan as outlined on the day of your      exam.  You should have a repeat colonoscopy examination for this problem           in 2 years.  Please call us if you are having persistent problems or have questions about your condition that have not been fully answered at this time.  Sincerely,  Ladene Artist MD [facility]  This letter has been electronically signed by your physician.  Appended Document: Patient Notice- Colon Biospy Results Letter mailed 1.14.11

## 2010-12-29 NOTE — Progress Notes (Signed)
Summary: update  Phone Note Call from Patient Call back at [phone]   Caller: Patient Call For: Dr. Fuller Plan Reason for Call: Talk to Nurse Summary of Call: pt wanted to report an update after taking medication for GERD, pt doesnt recall med name... pt says she is feeling better and would like refills... [facility] in [place] Initial call taken by: Lucien Mons,  January 06, 2010 1:11 PM  Follow-up for Phone Call        Pt states Dexilant has worked really well in keeping her reflux symptoms under control and she would like a rx sent to her pharmacy. I also told her I would mail her a discount savings card to help on her prescriptions. Card mailed to home address.  Follow-up by: Marlon Pel CMA Deborra Medina),  January 06, 2010 2:14 PM    New/Updated Medications: DEXILANT 60 MG CPDR (DEXLANSOPRAZOLE) one tablet by mouth once daily  Prescriptions: DEXILANT 60 MG CPDR (DEXLANSOPRAZOLE) one tablet by mouth once daily  #30 x 11   Entered by:   Marlon Pel CMA ([place])   Authorized by:   Ladene Artist MD [facility]   Signed by:   Marlon Pel CMA Deborra Medina) on 01/06/2010   Method used:   Electronically to        [facility] (retail)       [address] [place], [place]  [address]       Ph: [phone]       Fax: [phone]   RxID:   [id number]

## 2010-12-29 NOTE — Letter (Signed)
Summary: Appt Reminder [facility]  [address] [place], [place] [address]   Phone: [phone]  Fax: [phone]        December 08, 2009 MRN: [id number]    [facility] [address] [place], [place]  [address]    Dear Ms. Bok,   You have a return appointment with Dr. Fuller Plan on 12-31-09 at 8:30 am.  Please remember to bring a complete list of the medicines you are taking, your insurance card and your co-pay.  If you have to cancel or reschedule this appointment, please call before 5:00 pm the evening before to avoid a cancellation fee.  If you have any questions or concerns, please call [phone].    Sincerely,    Barb Merino RN, CGRN  Appended Document: Appt Reminder 2 letter mailed to patient's home

## 2010-12-29 NOTE — Assessment & Plan Note (Signed)
Summary: follow up colon/sheri   History of Present Illness Visit Type: Follow-up Visit Primary GI MD: Joylene Igo MD [facility] Primary Provider: Beatrice Lecher, MD Requesting Provider: Beatrice Lecher MD Chief Complaint: follow-up colonoscopy  pt. states the Protonix is not completely helping her heartburn History of Present Illness:   [name] notes breakthrough reflux symptoms on a daily basis despite using Protonix twice daily. We reviewed the findings on her colonoscopy discussed chronic ulcerative colitis chronic inflammation and pseudopolyps. She states she was recommended to remain on a 5-ASA agent previously, but has not done so because she has had no symptoms.   GI Review of Systems    Reports heartburn.      Denies abdominal pain, acid reflux, belching, bloating, chest pain, dysphagia with liquids, dysphagia with solids, loss of appetite, nausea, vomiting, vomiting blood, weight loss, and  weight gain.        Denies anal fissure, black tarry stools, change in bowel habit, constipation, diarrhea, diverticulosis, fecal incontinence, heme positive stool, hemorrhoids, irritable bowel syndrome, jaundice, light color stool, liver problems, rectal bleeding, and  rectal pain.   Current Medications (verified): 1)  Protonix 40 Mg  Tbec (Pantoprazole Sodium) .... Take One By Mouth Two Times A Day 2)  Sudafed 30 Mg Tabs (Pseudoephedrine Hcl) .... Take One By Mouth As Needed 3)  Ibuprofen 200 Mg Tabs (Ibuprofen) .... Take Four Tabs By Mouth As Needed 4)  Multivitamins  Tabs (Multiple Vitamin) .Marland Kitchen.. 1 Tablet By Mouth Once Daily  Allergies (verified): 1)  ! Tagamet  Past History:  Past Medical History: CHEST PAIN, PLEURITIC (ICD-786.52) HYPERTRIGLYCERIDEMIA (ICD-272.1) VITAMIN D DEFICIENCY (ICD-268.9) INSOMNIA (ICD-780.52) Ulcerative Colitis diagnosed at age 83 Basal cell skin Cancer Spirometry 10/2009: FVC 99%, FEV1 98%, ratio 83. No response to albuterol.  HX  gastric ulcer GERD  Past Surgical History: Reviewed history from 02/09/2007 and no changes required. Appendectomy age 72 Uterine Ablation age 64 Tubal ligaton age 51 Breast lift and aug 2004 T & A age 23  Family History: Reviewed history from 11/19/2009 and no changes required. Father w/ prostate Ca Sister w/ DM Dad w/ cholesterol Granparents with DM MGF with MI at 74s, smokeer.  Family History of UC: mother, maternal grandmother Family History of Heart Disease: maternal grandfather Family History of Barretts: Father Family History of Crohn's: Brother No FH of Colon Cancer:  Social History: Reviewed history from 11/19/2009 and no changes required. 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  Some college. Married to [place] w/ 3 kids.  1 kid stills live at home.   Former Smoker (25 years about 1/2 ppd on average).  quit in 2007 Alcohol use-yes every other weekend socially  Drug use-no Regular exercise-no  Review of Systems       The pertinent positives and negatives are noted as above and in the HPI. All other ROS were reviewed and were negative.   Vital Signs:  Patient profile:   45 year old female Height:      65 inches Weight:      179.13 pounds BMI:     29.92 Pulse rate:   72 / minute Pulse rhythm:   regular BP sitting:   108 / 70  (right arm)  Vitals Entered By: Randye Lobo NCMA (December 31, 2009 8:21 AM)  Physical Exam  General:  Well developed, well nourished, no acute distress. Head:  Normocephalic and atraumatic. Eyes:  PERRLA, no icterus. Mouth:  No deformity or lesions, dentition normal. Lungs:  Clear throughout  to auscultation. Heart:  Regular rate and rhythm; no murmurs, rubs,  or bruits. Abdomen:  Soft, nontender and nondistended. No masses, hepatosplenomegaly or hernias noted. Normal bowel sounds. Psych:  Alert and cooperative. Normal mood and affect.  Impression & Recommendations:  Problem # 1:  ULCERATIVE COLITIS (ICD-556.9) Long-standing ulcerative  colitis. Mild inflammation and pseudopolyps noted on recent colonoscopy. Surveillance colonoscopy in 2 years. Discussed the benefits of using a 5 ASA agent, long-term to suppress inflammation. In she would like to begin this therapy. Begin Lialda 2 po q.a.m.  Problem # 2:  GERD (ICD-530.81) Reflux symptoms, not well controlled on current regimen. Intensify antireflux measures. Discontinue protonix and begin Dexliant 60 mg q.a.m. If this is not effective, consider Nexium 40 mg twice daily and upper endoscopy.  Patient Instructions: 1)  Discontinue Protonix and start Dexilant one tablet by mouth once daily. 2)  Start Lialda 2 tablets by mouth every morning and pick up your prescription at your pharmacy.  3)  Please schedule a follow-up appointment in 6 to 8 weeks.  4)  Copy sent to : Beatrice Lecher, MD 5)  The medication list was reviewed and reconciled.  All changed / newly prescribed medications were explained.  A complete medication list was provided to the patient / caregiver.  Prescriptions: LIALDA 1.2 GM TBEC (MESALAMINE) 2 tablets by mouth every morning  #60 x 11   Entered by:   Marlon Pel CMA ([place])   Authorized by:   Ladene Artist MD [facility]   Signed by:   Ladene Artist MD [facility] on 12/31/2009   Method used:   Electronically to        [facility] (retail)       [address] [place], [place]  [address]       Ph: [phone]       Fax: [phone]   [place]:   [phone]

## 2011-02-09 LAB — CBC
HCT: 42.7 % (ref 36.0–46.0)
Hemoglobin: 14.8 g/dL (ref 12.0–15.0)
MCH: 32 pg (ref 26.0–34.0)
MCHC: 34.7 g/dL (ref 30.0–36.0)
MCV: 92.2 fL (ref 78.0–100.0)
Platelets: 254 10*3/uL (ref 150–400)
RBC: 4.63 MIL/uL (ref 3.87–5.11)
RDW: 14 % (ref 11.5–15.5)
WBC: 7.6 10*3/uL (ref 4.0–10.5)

## 2011-02-09 LAB — COMPREHENSIVE METABOLIC PANEL
ALT: 30 U/L (ref 0–35)
AST: 31 U/L (ref 0–37)
Albumin: 4.2 g/dL (ref 3.5–5.2)
Alkaline Phosphatase: 60 U/L (ref 39–117)
BUN: 6 mg/dL (ref 6–23)
CO2: 27 mEq/L (ref 19–32)
Calcium: 9.6 mg/dL (ref 8.4–10.5)
Chloride: 103 mEq/L (ref 96–112)
Creatinine, Ser: 0.75 mg/dL (ref 0.4–1.2)
GFR calc Af Amer: >60 mL/min (ref >60)
GFR calc non Af Amer: >60 mL/min (ref >60)
Glucose, Bld: 90 mg/dL (ref 70–99)
Potassium: 3.7 mEq/L (ref 3.5–5.1)
Sodium: 137 mEq/L (ref 135–145)
Total Bilirubin: 0.5 mg/dL (ref 0.3–1.2)
Total Protein: 7.3 g/dL (ref 6.0–8.3)

## 2011-02-09 LAB — DIFFERENTIAL
Basophils Absolute: 0 10*3/uL (ref 0.0–0.1)
Basophils Relative: 0 % (ref 0–1)
Eosinophils Absolute: 0.1 10*3/uL (ref 0.0–0.7)
Eosinophils Relative: 1 % (ref 0–5)
Lymphocytes Relative: 31 % (ref 12–46)
Lymphs Abs: 2.4 10*3/uL (ref 0.7–4.0)
Monocytes Absolute: 0.6 10*3/uL (ref 0.1–1.0)
Monocytes Relative: 8 % (ref 3–12)
Neutro Abs: 4.5 10*3/uL (ref 1.7–7.7)
Neutrophils Relative %: 59 % (ref 43–77)

## 2011-02-09 LAB — SURGICAL PCR SCREEN
MRSA, PCR: NEGATIVE
Staphylococcus aureus: POSITIVE — AB

## 2011-02-27 ENCOUNTER — Inpatient Hospital Stay (INDEPENDENT_AMBULATORY_CARE_PROVIDER_SITE_OTHER)
Admission: RE | Admit: 2011-02-27 | Discharge: 2011-02-27 | Disposition: A | Discharge status: Home / Self Care | Payer: Self-pay | Source: Ambulatory Visit | Attending: Emergency Medicine | Admitting: Emergency Medicine

## 2011-02-27 ENCOUNTER — Encounter: Payer: Self-pay | Admitting: Emergency Medicine

## 2011-02-27 DIAGNOSIS — J309 Allergic rhinitis, unspecified: Secondary | ICD-10-CM

## 2011-03-02 LAB — CBC
HCT: 41.3 % (ref 36.0–46.0)
Hemoglobin: 14.5 g/dL (ref 12.0–15.0)
MCHC: 35 g/dL (ref 30.0–36.0)
MCV: 91.5 fL (ref 78.0–100.0)
MCV: 92.2 fL (ref 78.0–100.0)
Platelets: 257 10*3/uL (ref 150–400)
RBC: 3.87 MIL/uL (ref 3.87–5.11)
RBC: 4.51 MIL/uL (ref 3.87–5.11)
RDW: 13.3 % (ref 11.5–15.5)
WBC: 4 10*3/uL (ref 4.0–10.5)
WBC: 8 10*3/uL (ref 4.0–10.5)

## 2011-03-02 LAB — BASIC METABOLIC PANEL
BUN: 8 mg/dL (ref 6–23)
CO2: 30 mEq/L (ref 19–32)
Calcium: 8.1 mg/dL — ABNORMAL LOW (ref 8.4–10.5)
Calcium: 8.9 mg/dL (ref 8.4–10.5)
Chloride: 104 mEq/L (ref 96–112)
Chloride: 109 mEq/L (ref 96–112)
Creatinine, Ser: 0.51 mg/dL (ref 0.4–1.2)
Creatinine, Ser: 0.6 mg/dL (ref 0.4–1.2)
GFR calc Af Amer: >60 mL/min (ref >60)
GFR calc Af Amer: >60 mL/min (ref >60)
GFR calc non Af Amer: >60 mL/min (ref >60)
GFR calc non Af Amer: >60 mL/min (ref >60)
Glucose, Bld: 78 mg/dL (ref 70–99)
Potassium: 3.8 mEq/L (ref 3.5–5.1)
Sodium: 138 mEq/L (ref 135–145)

## 2011-03-02 LAB — CARDIAC PANEL(CRET KIN+CKTOT+MB+TROPI)
CK, MB: 0.9 ng/mL (ref 0.3–4.0)
Relative Index: INVALID (ref 0.0–2.5)
Total CK: 60 U/L (ref 7–177)
Troponin I: <0.01 ng/mL (ref 0.00–0.06)

## 2011-03-02 LAB — DIFFERENTIAL
Basophils Relative: 0 % (ref 0–1)
Lymphs Abs: 1.5 10*3/uL (ref 0.7–4.0)
Monocytes Relative: 5 % (ref 3–12)
Neutro Abs: 6 10*3/uL (ref 1.7–7.7)
Neutrophils Relative %: 74 % (ref 43–77)

## 2011-03-02 LAB — POCT CARDIAC MARKERS
CKMB, poc: <1 ng/mL — ABNORMAL LOW (ref 1.0–8.0)
Myoglobin, poc: 50.1 ng/mL (ref 12–200)

## 2011-03-02 LAB — D-DIMER, QUANTITATIVE: D-Dimer, Quant: 0.3 ug/mL-FEU (ref 0.00–0.48)

## 2011-03-02 LAB — CK TOTAL AND CKMB (NOT AT ARMC)
Total CK: 83 U/L (ref 7–177)
Total CK: 97 U/L (ref 7–177)

## 2011-03-02 LAB — TSH: TSH: 3.396 u[IU]/mL (ref 0.350–4.500)

## 2011-03-02 LAB — TROPONIN I: Troponin I: 0.01 ng/mL (ref 0.00–0.06)

## 2011-03-02 NOTE — Assessment & Plan Note (Signed)
Summary: cough/sore throat/TM(rm3)   Vital Signs:  Patient Profile:   45 Years Old Female CC:      Cold & URI symptoms Height:     65 inches Weight:      167 pounds O2 Sat:      98 % O2 treatment:    98 Temp:     98.8 degrees F oral Pulse rate:   84 / minute Resp:     20 per minute BP sitting:   119 / 82  (left arm) Cuff size:   regular  Vitals Entered By: Earley Favor RN (February 27, 2011 3:41 PM)                  Updated Prior Medication List: SUDAFED 30 MG TABS (PSEUDOEPHEDRINE HCL) take one by mouth as needed IBUPROFEN 200 MG TABS (IBUPROFEN) take four tabs by mouth as needed MULTIVITAMINS  TABS (MULTIPLE VITAMIN) 1 tablet by mouth once daily DEXILANT 60 MG CPDR (DEXLANSOPRAZOLE) one tablet by mouth once daily PROAIR HFA 108 (90 BASE) MCG/ACT  AERS (ALBUTEROL SULFATE) 2 inh q4h as needed shortness of breath METRONIDAZOLE 500 MG TABS (METRONIDAZOLE) Take 1 tablet by mouth two times a day for 7 days  Current Allergies (reviewed today): ! TAGAMETHistory of Present Illness Chief Complaint: Cold & URI symptoms History of Present Illness: 45 Years Old Female complains of onset of cold symptoms for 4-5 days.  Deari has been using no OTC meds. She is a Network engineer at [employer].  She gets similar symptoms every spring and sometimes in the fall. No sore throat + dry cough +hoarse No pleuritic pain + wheezing +nasal congestion + post-nasal drainage No sinus pain/pressure No chest congestion No itchy/red eyes No earache No hemoptysis +SOB No chills/sweats No fever No nausea No vomiting No abdominal pain No diarrhea No skin rashes No fatigue No myalgias No headache   REVIEW OF SYSTEMS Constitutional Symptoms      Denies fever, chills, night sweats, weight loss, weight gain, and fatigue.  Eyes       Complains of glasses.      Denies change in vision, eye pain, eye discharge, contact lenses, and eye surgery. Ear/Nose/Throat/Mouth       Complains of frequent runny  nose, sinus problems, and hoarseness.      Denies hearing loss/aids, change in hearing, ear pain, ear discharge, dizziness, frequent nose bleeds, sore throat, and tooth pain or bleeding.  Respiratory       Complains of wheezing and shortness of breath.      Denies dry cough, productive cough, asthma, bronchitis, and emphysema/COPD.  Cardiovascular       Denies murmurs, chest pain, and tires easily with exhertion.    Gastrointestinal       Denies stomach pain, nausea/vomiting, diarrhea, constipation, blood in bowel movements, and indigestion. Genitourniary       Denies painful urination, kidney stones, and loss of urinary control. Neurological       Denies paralysis, seizures, and fainting/blackouts. Musculoskeletal       Denies muscle pain, joint pain, joint stiffness, decreased range of motion, redness, swelling, muscle weakness, and gout.  Skin       Denies bruising, unusual mles/lumps or sores, and hair/skin or nail changes.  Psych       Denies mood changes, temper/anger issues, anxiety/stress, speech problems, depression, and sleep problems. Other Comments: symptoms started 4-5 days ago   Past History:  Past Medical History: Reviewed history from 07/14/2010 and no changes required.  Ulcerative Colitis diagnosed at age 89 Basal cell skin Cancer Spirometry 10/2009: FVC 99%, FEV1 98%, ratio 83. No response to albuterol.  HX gastric ulcer GERD  Past Surgical History: Reviewed history from 02/09/2007 and no changes required. Appendectomy age 14 Uterine Ablation age 61 Tubal ligaton age 57 Breast lift and aug 2004 T & A age 45  Family History: Reviewed history from 11/19/2009 and no changes required. Father w/ prostate Ca Sister w/ DM Dad w/ cholesterol Granparents with DM MGF with MI at 68s, smokeer.  Family History of UC: mother, maternal grandmother Family History of Heart Disease: maternal grandfather Family History of Barretts: Father Family History of Crohn's:  Brother No FH of Colon Cancer:  Social History: Reviewed history from 11/19/2009 and no changes required. 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  1 kid stills live at home.   Former Smoker (25 years about 1/2 ppd on average).  quit in 2007 Alcohol use-yes every other weekend socially  Drug use-no Regular exercise-no Physical Exam General appearance: well developed, well nourished, no acute distress Ears: normal, no lesions or deformities Nasal: clear discharge Oral/Pharynx: clear PND, no erythema, no exudate, OP patent Neck: neck supple,  trachea midline, no masses Chest/Lungs: no rales, wheezes, or rhonchi bilateral, breath sounds equal without effort Heart: regular rate and  rhythm, no murmur MSE: oriented to time, place, and person Assessment New Problems: ALLERGIC RHINITIS (ICD-477.9)   Plan New Medications/Changes: VENTOLIN HFA 108 (90 BASE) MCG/ACT AERS (ALBUTEROL SULFATE) 1-2 puffs q6-8 hrs as needed for wheezing  #1 x 2, 02/27/2011, Fidela Salisbury MD FLONASE 50 MCG/ACT SUSP (FLUTICASONE PROPIONATE) 2 sprays each nostril daily  #1 x 3, 02/27/2011, Fidela Salisbury MD CHERATUSSIN AC 100-10 MG/5ML SYRP (GUAIFENESIN-CODEINE) 5cc q6 hrs as needed for cough  #6oz x 0, 02/27/2011, Fidela Salisbury MD  New Orders: Est. Patient Level IV [75170] Solumedrol up to 152m [J2930] Pulse Oximetry (single measurment) [94760] Services provided After hours-Weekends-Holidays [99051] Admin of Therapeutic Inj  intramuscular or subcutaneous [96372] Planning Comments:   Will treat with Flonase + OTC antihistamine of her choice.  Will treat acute flare with solumedrol 125 + cheratussin + inhaler.  Should speak with her PCP about being on Flonase + Zyrtec every spring and perhaps every fall to try to prevent these occurrances.  Hydration, rest, nasal saline.  Follow-up with your primary care physician if not improving or if getting worse   The patient  and/or caregiver has been counseled thoroughly with regard to medications prescribed including dosage, schedule, interactions, rationale for use, and possible side effects and they verbalize understanding.  Diagnoses and expected course of recovery discussed and will return if not improved as expected or if the condition worsens. Patient and/or caregiver verbalized understanding.  Prescriptions: VENTOLIN HFA 108 (90 BASE) MCG/ACT AERS (ALBUTEROL SULFATE) 1-2 puffs q6-8 hrs as needed for wheezing  #1 x 2   Entered and Authorized by:   JFidela SalisburyMD   Signed by:   JFidela SalisburyMD on 02/27/2011   Method used:   Print then Give to Patient   RxID:   [id number] 50 MCG/ACT SUSP (FLUTICASONE PROPIONATE) 2 sprays each nostril daily  #1 x 3   Entered and Authorized by:   JFidela SalisburyMD   Signed by:   JFidela SalisburyMD on 02/27/2011   Method used:   Print then Give to Patient   RxID:   [id number] AC 100-10 MG/5ML SYRP (GUAIFENESIN-CODEINE) 5cc  q6 hrs as needed for cough  #6oz x 0   Entered and Authorized by:   Fidela Salisbury MD   Signed by:   Fidela Salisbury MD on 02/27/2011   Method used:   Print then Give to Patient   RxID:   [id number]   Medication Administration  Injection # 1:    Medication: Solumedrol up to 178m    Diagnosis: ALLERGIC RHINITIS (ICD-477.9)    Route: IM    Site: RUOQ gluteus    Exp Date: 08/29/2013    Lot #: [id number]    Mfr: Pharmacia    Patient tolerated injection without complications    Given by: SEarley FavorRN (February 27, 2011 4:18 PM)  Orders Added: 1)  Est. Patient Level IV [[10272]2)  Solumedrol up to 1280m[J2930] 3)  Pulse Oximetry (single measurment) [94760] 4)  Services provided After hours-Weekends-Holidays [99051] 5)  Admin of Therapeutic Inj  intramuscular or subcutaneous [9[53664]

## 2011-03-04 ENCOUNTER — Telehealth (INDEPENDENT_AMBULATORY_CARE_PROVIDER_SITE_OTHER): Payer: Self-pay | Admitting: *Deleted

## 2011-03-26 ENCOUNTER — Other Ambulatory Visit: Payer: Self-pay | Admitting: Gastroenterology

## 2011-03-26 NOTE — Telephone Encounter (Signed)
NEEDS OFFICE VISIT.

## 2011-04-12 ENCOUNTER — Inpatient Hospital Stay (INDEPENDENT_AMBULATORY_CARE_PROVIDER_SITE_OTHER)
Admission: RE | Admit: 2011-04-12 | Discharge: 2011-04-12 | Disposition: A | Discharge status: Home / Self Care | Payer: 59 | Source: Ambulatory Visit | Attending: Family Medicine | Admitting: Family Medicine

## 2011-04-12 ENCOUNTER — Encounter: Payer: Self-pay | Admitting: Family Medicine

## 2011-04-12 DIAGNOSIS — M76899 Other specified enthesopathies of unspecified lower limb, excluding foot: Secondary | ICD-10-CM | POA: Insufficient documentation

## 2011-04-12 DIAGNOSIS — M533 Sacrococcygeal disorders, not elsewhere classified: Secondary | ICD-10-CM

## 2011-04-14 ENCOUNTER — Telehealth (INDEPENDENT_AMBULATORY_CARE_PROVIDER_SITE_OTHER): Payer: Self-pay | Admitting: Emergency Medicine

## 2011-05-27 ENCOUNTER — Inpatient Hospital Stay (INDEPENDENT_AMBULATORY_CARE_PROVIDER_SITE_OTHER)
Admission: RE | Admit: 2011-05-27 | Discharge: 2011-05-27 | Disposition: A | Discharge status: Home / Self Care | Payer: 59 | Source: Ambulatory Visit | Attending: Family Medicine | Admitting: Family Medicine

## 2011-05-27 DIAGNOSIS — M549 Dorsalgia, unspecified: Secondary | ICD-10-CM

## 2011-06-06 ENCOUNTER — Other Ambulatory Visit: Payer: Self-pay | Admitting: Gastroenterology

## 2011-06-07 NOTE — Telephone Encounter (Signed)
NEEDS OFFICE VISIT FOR ANY FURTHER REFILLS! 

## 2011-06-15 ENCOUNTER — Other Ambulatory Visit (HOSPITAL_COMMUNITY): Payer: Self-pay | Admitting: Orthopedic Surgery

## 2011-06-15 DIAGNOSIS — M5412 Radiculopathy, cervical region: Secondary | ICD-10-CM

## 2011-06-15 DIAGNOSIS — M542 Cervicalgia: Secondary | ICD-10-CM

## 2011-06-18 ENCOUNTER — Ambulatory Visit (HOSPITAL_COMMUNITY)
Admission: RE | Admit: 2011-06-18 | Discharge: 2011-06-18 | Disposition: A | Discharge status: Home / Self Care | Payer: 59 | Source: Ambulatory Visit | Attending: Orthopedic Surgery | Admitting: Orthopedic Surgery

## 2011-06-18 DIAGNOSIS — M542 Cervicalgia: Secondary | ICD-10-CM

## 2011-06-18 DIAGNOSIS — M5030 Other cervical disc degeneration, unspecified cervical region: Secondary | ICD-10-CM | POA: Insufficient documentation

## 2011-06-18 DIAGNOSIS — M79609 Pain in unspecified limb: Secondary | ICD-10-CM | POA: Insufficient documentation

## 2011-06-18 DIAGNOSIS — M25519 Pain in unspecified shoulder: Secondary | ICD-10-CM | POA: Insufficient documentation

## 2011-06-18 DIAGNOSIS — M5412 Radiculopathy, cervical region: Secondary | ICD-10-CM

## 2011-06-18 DIAGNOSIS — M47812 Spondylosis without myelopathy or radiculopathy, cervical region: Secondary | ICD-10-CM | POA: Insufficient documentation

## 2011-08-22 ENCOUNTER — Other Ambulatory Visit: Payer: Self-pay | Admitting: Gastroenterology

## 2011-08-23 ENCOUNTER — Telehealth: Payer: Self-pay | Admitting: Gastroenterology

## 2011-08-23 MED ORDER — DEXLANSOPRAZOLE 60 MG PO CPDR: DELAYED_RELEASE_CAPSULE | ORAL | Status: DC

## 2011-08-23 NOTE — Telephone Encounter (Signed)
Patient was advised at office visit on 12/31/2009 to come back in 6-8 weeks, she states that she is not having any problems and does not want to come back until her colonoscopy is due. I advised her that she must be seen once a year to have meds refilled by Dr Fuller Plan. She says she will have her primary care MD to fill Dexilant since she does not need to be seen by Dr June Leap. She also advised me that she never took the [place] since it was just given in case she needs it.

## 2011-08-23 NOTE — Telephone Encounter (Signed)
Advised pt rx sent for one month but she must keep office visit or she will not get anymore refills.

## 2011-08-25 ENCOUNTER — Encounter: Payer: Self-pay | Admitting: Emergency Medicine

## 2011-08-25 ENCOUNTER — Inpatient Hospital Stay (INDEPENDENT_AMBULATORY_CARE_PROVIDER_SITE_OTHER)
Admission: RE | Admit: 2011-08-25 | Discharge: 2011-08-25 | Disposition: A | Discharge status: Home / Self Care | Payer: 59 | Source: Ambulatory Visit | Attending: Emergency Medicine | Admitting: Emergency Medicine

## 2011-08-25 DIAGNOSIS — L03019 Cellulitis of unspecified finger: Secondary | ICD-10-CM

## 2011-08-25 DIAGNOSIS — L02519 Cutaneous abscess of unspecified hand: Secondary | ICD-10-CM

## 2011-09-14 ENCOUNTER — Ambulatory Visit (INDEPENDENT_AMBULATORY_CARE_PROVIDER_SITE_OTHER): Payer: 59 | Admitting: Gastroenterology

## 2011-09-14 ENCOUNTER — Encounter: Payer: Self-pay | Admitting: Gastroenterology

## 2011-09-14 DIAGNOSIS — K5190 Ulcerative colitis, unspecified, without complications: Secondary | ICD-10-CM

## 2011-09-14 DIAGNOSIS — K219 Gastro-esophageal reflux disease without esophagitis: Secondary | ICD-10-CM

## 2011-09-14 MED ORDER — DEXLANSOPRAZOLE 60 MG PO CPDR: DELAYED_RELEASE_CAPSULE | ORAL | Status: DC

## 2011-09-14 MED ORDER — MESALAMINE 1.2 G PO TBEC: 2400.0000 mg | DELAYED_RELEASE_TABLET | Freq: Every day | ORAL | Status: DC

## 2011-09-14 NOTE — Assessment & Plan Note (Signed)
Well-controlled. Continue standard antireflux measures and Dexilant 60 mg daily

## 2011-09-14 NOTE — Progress Notes (Signed)
History of Present Illness: This is a 45 year old female returning for followup of GERD and ulcerative colitis. Her reflux symptoms are under very good control on her current medication. She states she took Lialda for a few months and then discontinued it. She did not note any improvement in symptoms and states her stools may have been slightly looser. I reviewed with her in detail her findings her last colonoscopy including the biopsy findings show areas of active colitis. Denies weight loss, abdominal pain, constipation, diarrhea, change in stool caliber, melena, hematochezia, nausea, vomiting, dysphagia, chest pain.  Current Medications, Allergies, Past Medical History, Past Surgical History, Family History and Social History were reviewed in [employer] record.  Physical Exam: General: Well developed , well nourished, no acute distress Head: Normocephalic and atraumatic Eyes:  sclerae anicteric, EOMI Ears: Normal auditory acuity Mouth: No deformity or lesions Lungs: Clear throughout to auscultation Heart: Regular rate and rhythm; no murmurs, rubs or bruits Abdomen: Soft, non tender and non distended. No masses, hepatosplenomegaly or hernias noted. Normal Bowel sounds Musculoskeletal: Symmetrical with no gross deformities  Pulses:  Normal pulses noted Extremities: No clubbing, cyanosis, edema or deformities noted Neurological: Alert oriented x 4, grossly nonfocal Psychological:  Alert and cooperative. Normal mood and affect  Assessment and Recommendations:

## 2011-09-14 NOTE — Patient Instructions (Addendum)
Your prescriptions for Lialda and Dexilant have been sent to your pharmacy.  cc: Lynelle Smoke, MD

## 2011-09-14 NOTE — Assessment & Plan Note (Addendum)
She discontinued Lialda few months after her last office visit. We had a long discussion about the benefits of 5 ASA medications for suppression of inflammation and the potential long-term improvement with disease control and cancer risk in UC. Surveillance colonoscopy due in January. She agrees to resume Lialda 2.4 g daily

## 2011-10-07 ENCOUNTER — Encounter: Payer: Self-pay | Admitting: Family Medicine

## 2011-10-08 ENCOUNTER — Encounter: Payer: Self-pay | Admitting: Family Medicine

## 2011-10-08 ENCOUNTER — Ambulatory Visit (INDEPENDENT_AMBULATORY_CARE_PROVIDER_SITE_OTHER): Payer: 59 | Admitting: Family Medicine

## 2011-10-08 DIAGNOSIS — K219 Gastro-esophageal reflux disease without esophagitis: Secondary | ICD-10-CM

## 2011-10-08 DIAGNOSIS — F419 Anxiety disorder, unspecified: Secondary | ICD-10-CM

## 2011-10-08 DIAGNOSIS — M5412 Radiculopathy, cervical region: Secondary | ICD-10-CM

## 2011-10-08 DIAGNOSIS — F329 Major depressive disorder, single episode, unspecified: Secondary | ICD-10-CM

## 2011-10-08 DIAGNOSIS — F341 Dysthymic disorder: Secondary | ICD-10-CM

## 2011-10-08 DIAGNOSIS — F32A Depression, unspecified: Secondary | ICD-10-CM

## 2011-10-08 DIAGNOSIS — K5190 Ulcerative colitis, unspecified, without complications: Secondary | ICD-10-CM

## 2011-10-08 MED ORDER — CLONAZEPAM 0.5 MG PO TABS: 0.5000 mg | ORAL_TABLET | Freq: Every evening | ORAL | Status: DC | PRN

## 2011-10-08 MED ORDER — CELECOXIB 200 MG PO CAPS: 200.0000 mg | ORAL_CAPSULE | Freq: Every day | ORAL | Status: DC

## 2011-10-08 MED ORDER — ESCITALOPRAM OXALATE 10 MG PO TABS: 10.0000 mg | ORAL_TABLET | Freq: Every day | ORAL | Status: DC

## 2011-10-08 NOTE — Assessment & Plan Note (Signed)
New.  Pt dx'd on MRI.  Had steroid injxn w/ resolution of R arm numbness.  Following w/ [facility].  Refill provided on celebrex.  Will follow and assist as able.

## 2011-10-08 NOTE — Patient Instructions (Signed)
Schedule your complete physical in 4-6 weeks- do not eat before this Start the Lexapro daily Use the Klonopin as needed for sleep Call with any questions or concerns Hang in there!  You're stronger than you know! Welcome!  We're glad to have you!!!!

## 2011-10-08 NOTE — Assessment & Plan Note (Signed)
New.  This is pt's biggest issue currently.  Having a hard time dealing w/ stress of relationship, work, family.  Sleeping poorly.  Start klonopin prn for sleep.  Start lexapro daily as controller med.  Names and #s given for counseling.  Will follow closely.

## 2011-10-08 NOTE — Progress Notes (Signed)
  Subjective:    Patient ID: Melissa Cohen, female    DOB: 1966/06/17, 45 y.o.   MRN: [id number]  HPI New to establish.  Previous MD- [place]y.  GI- Fuller Plan.  GYN- [facility].  Ortho- [facility]   Cervical radiculopathy- due to degenerative disc dz.  is seeing ortho for this, has had epidural injxn.  No longer having R arm numbness.  On Celebrex- needs refill.  UC- dx'd at age 53, sees Dr Fuller Plan.  On Mesalamine.  Reports she is having infrequent flares.  GERD- chronic problem, on Dexilant w/ good sxs relief.  If misses meds will have sxs recur.  Obesity- started phentermine 2 months ago w/ Dr Ronita Hipps.  Stress- pt reports emotional eating, 'i'm really stressed out', divorced last yr after 61 yrs- daughter left to be w/ dad.  Now has a girlfriend.  Reports relationship has a lot of baggage.  Dad in [place] is dying of cancer.  Has new grandbaby.  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  Has seen a counselor 4-5 times earlier this year.  Started some left over Wellbutrin (45 yrs old, used to this to quit smoking).   Review of Systems For ROS see HPI     Objective:   Physical Exam  Constitutional: She is oriented to person, place, and time. She appears well-developed and well-nourished. No distress.  HENT:  Head: Normocephalic and atraumatic.  Eyes: Conjunctivae and EOM are normal. Pupils are equal, round, and reactive to light.  Neck: Normal range of motion. Neck supple. No thyromegaly present.  Cardiovascular: Normal rate, regular rhythm, normal heart sounds and intact distal pulses.   No murmur heard. Pulmonary/Chest: Effort normal and breath sounds normal. No respiratory distress.  Abdominal: Soft. She exhibits no distension. There is no tenderness.  Musculoskeletal: She exhibits no edema.  Lymphadenopathy:    She has no cervical adenopathy.  Neurological: She is alert and oriented to person, place, and time.  Skin: Skin is warm and dry.  Psychiatric:       Tearful, anxious            Assessment & Plan:

## 2011-10-08 NOTE — Assessment & Plan Note (Signed)
Chronic problem.  Well controlled on [place].  No changes.

## 2011-10-08 NOTE — Assessment & Plan Note (Signed)
Chronic problem.  Following regularly w/ GI.  On mesalamine regularly to prevent flares.  Will follow along and assist as able.

## 2011-10-28 ENCOUNTER — Other Ambulatory Visit (HOSPITAL_BASED_OUTPATIENT_CLINIC_OR_DEPARTMENT_OTHER): Payer: Self-pay | Admitting: Obstetrics and Gynecology

## 2011-10-28 DIAGNOSIS — Z1231 Encounter for screening mammogram for malignant neoplasm of breast: Secondary | ICD-10-CM

## 2011-11-01 NOTE — Progress Notes (Signed)
Summary: LOWER BACK PAIN rm 4   Vital Signs:  Patient Profile:   45 Years Old Female CC:      LBP radiating to both hips x  4 days Height:     65 inches Weight:      170 pounds O2 Sat:      98 % O2 treatment:    Room Air Temp:     98.6 degrees F oral Pulse rate:   84 / minute Resp:     16 per minute BP standing:   124 / 85  (right arm) Cuff size:   regular  Pt. in pain?   yes    Location:   neck    Intensity:   7    Type:       sharp  Vitals Entered By: Charna Archer LPN (Apr 11, 5572 22:02 PM)                   Updated Prior Medication List: MULTIVITAMINS  TABS (MULTIPLE VITAMIN) 1 tablet by mouth once daily DEXILANT 60 MG CPDR (DEXLANSOPRAZOLE) one tablet by mouth once daily PROAIR HFA 108 (90 BASE) MCG/ACT  AERS (ALBUTEROL SULFATE) 2 inh q4h as needed shortness of breath FLONASE 50 MCG/ACT SUSP (FLUTICASONE PROPIONATE) 2 sprays each nostril daily VENTOLIN HFA 108 (90 BASE) MCG/ACT AERS (ALBUTEROL SULFATE) 1-2 puffs q6-8 hrs as needed for wheezing PHENTERMINE HCL 37.5 MG CAPS (PHENTERMINE HCL)   Current Allergies (reviewed today): ! TAGAMETHistory of Present Illness Chief Complaint: LBP radiating to both hips x  4 days History of Present Illness:  Subjective:  Patient complains of 3 day history of constant mid low back ache that radiates to her bilateral hips.  The discomfort is worse when sitting and arising from a chair, climbing stairs.  She has less pain when walking and standing.  No bowel or bladder incontinence, although she notes that she has had mild urinary stress incontinence for about a month.  She recalls no recent trauma to back or significant change in physical activities.  No fevers, chills, and sweats.  She notes that she had mild scoliosis as a child.  REVIEW OF SYSTEMS Constitutional Symptoms      Denies fever, chills, night sweats, weight loss, weight gain, and fatigue.  Eyes       Denies change in vision, eye pain, eye discharge, glasses,  contact lenses, and eye surgery. Ear/Nose/Throat/Mouth       Denies hearing loss/aids, change in hearing, ear pain, ear discharge, dizziness, frequent runny nose, frequent nose bleeds, sinus problems, sore throat, hoarseness, and tooth pain or bleeding.  Respiratory       Denies dry cough, productive cough, wheezing, shortness of breath, asthma, bronchitis, and emphysema/COPD.  Cardiovascular       Denies murmurs, chest pain, and tires easily with exhertion.    Gastrointestinal       Denies stomach pain, nausea/vomiting, diarrhea, constipation, blood in bowel movements, and indigestion. Genitourniary       Denies painful urination, kidney stones, and loss of urinary control. Neurological       Complains of numbness and tingling.      Denies paralysis, seizures, and fainting/blackouts. Musculoskeletal       Complains of muscle pain, joint pain, decreased range of motion, and muscle weakness.      Denies joint stiffness, redness, swelling, and gout.  Skin       Denies bruising, unusual mles/lumps or sores, and hair/skin or nail changes.  Psych  Complains of anxiety/stress.      Denies mood changes, temper/anger issues, speech problems, depression, and sleep problems. Other Comments: pt c/o low back pain radiating to both hips x 4 days. she has taken IBF and Naproxen with no relief.   Past History:  Past Medical History: Reviewed history from 07/14/2010 and no changes required. Ulcerative Colitis diagnosed at age 56 Basal cell skin Cancer Spirometry 10/2009: FVC 99%, FEV1 98%, ratio 83. No response to albuterol.  HX gastric ulcer GERD  Past Surgical History: Reviewed history from 02/09/2007 and no changes required. Appendectomy age 54 Uterine Ablation age 82 Tubal ligaton age 30 Breast lift and aug 2004 T & A age 14  Family History: Reviewed history from 11/19/2009 and no changes required. Father w/ prostate Ca Sister w/ DM Dad w/ cholesterol Granparents with DM MGF  with MI at 73s, smokeer.  Family History of UC: mother, maternal grandmother Family History of Heart Disease: maternal grandfather Family History of Barretts: Father Family History of Crohn's: Brother No FH of Colon Cancer:  Social History: Reviewed history from 11/19/2009 and no changes required. 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  Some college. Married to [place] w/ 3 kids.  1 kid stills live at home.   Former Smoker (25 years about 1/2 ppd on average).  quit in 2007 Alcohol use-yes every other weekend socially  Drug use-no Regular exercise-no   Objective:  No acute distress  Eyes:  Pupils are equal, round, and reactive to light and accomodation.  Extraocular movement is intact.  Conjunctivae are not inflamed.  Neck:  Supple.  No adenopathy is present.   Lungs:  Clear to auscultation.  Breath sounds are equal.  Heart:  Regular rate and rhythm without murmurs, rubs, or gallops.  Abdomen:  Nontender without masses or hepatosplenomegaly.  Bowel sounds are present.  No CVA or flank tenderness.   Back:   Tenderness over bilateral SI joints, more pronounced on the left.  Straight leg raising test is negative.  Sitting knee extension test is negative.  Strength and sensation in the lower extremities is normal.  Patellar and achilles reflexes are normal.  Extremities:  No edema lower legs.  Hips:  Distinct tenderness over greater trochanters bilaterally, worse with resisted lateral abduction of the hips. Skin:  No rash Assessment New Problems: TROCHANTERIC BURSITIS, BILATERAL (ICD-726.5) SACROILIAC JOINT DYSFUNCTION (ICD-724.6)   Plan New Medications/Changes: CYCLOBENZAPRINE HCL 10 MG TABS (CYCLOBENZAPRINE HCL) One tab by mouth two to three times daily as needed  #20 x 1, 04/12/2011, Theone Murdoch MD PREDNISONE 10 MG TABS (PREDNISONE) 2 PO BID for 3 days, then 1 BID for 2 days, then 1 daily for 2 days.  Take PC  #18 x 0, 04/12/2011, Theone Murdoch MD  New Orders: Est. Patient Level IV  [phone] Planning Comments:   She reports that she had no response from NSAID.  Begin tapering course of prednisone.  Begin Flexeril Begin applying ice pack several times daily.  Begin stretching and range of motion exercises ([facility] information and instruction patient handout given)  Followup with [facility] if not improved in two weeks.    The patient and/or caregiver has been counseled thoroughly with regard to medications prescribed including dosage, schedule, interactions, rationale for use, and possible side effects and they verbalize understanding.  Diagnoses and expected course of recovery discussed and will return if not improved as expected or if the condition worsens. Patient and/or caregiver verbalized understanding.  Prescriptions: CYCLOBENZAPRINE HCL 10 MG TABS (CYCLOBENZAPRINE HCL)  One tab by mouth two to three times daily as needed  #20 x 1   Entered and Authorized by:   Theone Murdoch MD   Signed by:   Theone Murdoch MD on 04/12/2011   Method used:   Print then Give to Patient   RxID:   [id number] PREDNISONE 10 MG TABS (PREDNISONE) 2 PO BID for 3 days, then 1 BID for 2 days, then 1 daily for 2 days.  Take PC  #18 x 0   Entered and Authorized by:   Theone Murdoch MD   Signed by:   Theone Murdoch MD on 04/12/2011   Method used:   Print then Give to Patient   RxID:   [id number]   Orders Added: 1)  Est. Patient Level IV [32346]

## 2011-11-01 NOTE — Telephone Encounter (Signed)
  Phone Note Call from Patient   Caller: Patient Summary of Call: Patient reports she is not feeling any imporvement and her cough is worse. Denies fever. Rx for Z-pak and Tussinex (3oz), per Dr. Koleen Nimrod, called in to [facility] [address] in [place] Initial call taken by: Betti Cruz RN,  March 04, 2011 11:25 AM  Follow-up for Phone Call        agreed with nurse note, Rx called in Follow-up by: Fidela Salisbury MD,  March 04, 2011 11:37 AM

## 2011-11-01 NOTE — Progress Notes (Signed)
Summary: POSS FB IN RT INDEX FINGER...WSE   Vital Signs:  Patient Profile:   45 Years Old Female CC:      possible foreign body in right #1 finger x 5 days Height:     65 inches Weight:      177 pounds O2 Sat:      99 % O2 treatment:    Room Air Temp:     98.6 degrees F oral Pulse rate:   79 / minute Resp:     16 per minute BP sitting:   129 / 85  (left arm) Cuff size:   regular  Vitals Entered By: Mallie Snooks RN (August 25, 2011 3:32 PM)                  Updated Prior Medication List: MULTIVITAMINS  TABS (MULTIPLE VITAMIN) 1 tablet by mouth once daily DEXILANT 60 MG CPDR (DEXLANSOPRAZOLE) one tablet by mouth once daily  Current Allergies (reviewed today): ! TAGAMETHistory of Present Illness History from: patient Chief Complaint: possible foreign body in right #1 finger x 5 days History of Present Illness: R index finger swelling, foreign body? for 5 days.  Was working outside in her yard, maybe got scratched?  Feels swollen and painful.  No F/C/N/V.  UTD on Td.  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  REVIEW OF SYSTEMS Constitutional Symptoms      Denies fever, chills, night sweats, weight loss, weight gain, and fatigue.  Eyes       Denies change in vision, eye pain, eye discharge, glasses, contact lenses, and eye surgery. Ear/Nose/Throat/Mouth       Denies hearing loss/aids, change in hearing, ear pain, ear discharge, dizziness, frequent runny nose, frequent nose bleeds, sinus problems, sore throat, hoarseness, and tooth pain or bleeding.  Respiratory       Denies dry cough, productive cough, wheezing, shortness of breath, asthma, bronchitis, and emphysema/COPD.  Cardiovascular       Denies murmurs, chest pain, and tires easily with exhertion.    Gastrointestinal       Denies stomach pain, nausea/vomiting, diarrhea, constipation, blood in bowel movements, and indigestion. Genitourniary       Denies painful urination, kidney stones, and loss of urinary  control. Neurological       Complains of headaches.      Denies paralysis, seizures, and fainting/blackouts. Musculoskeletal       Denies muscle pain, joint pain, joint stiffness, decreased range of motion, redness, swelling, muscle weakness, and gout.  Skin       Complains of unusual moles/lumps or sores.      Denies bruising and hair/skin or nail changes.  Psych       Denies mood changes, temper/anger issues, anxiety/stress, speech problems, depression, and sleep problems. Other Comments: possible foreign body in right index finger x 5 days   Past History:  Past Medical History: Reviewed history from 07/14/2010 and no changes required. Ulcerative Colitis diagnosed at age 17 Basal cell skin Cancer Spirometry 10/2009: FVC 99%, FEV1 98%, ratio 83. No response to albuterol.  HX gastric ulcer GERD  Past Surgical History: Reviewed history from 02/09/2007 and no changes required. Appendectomy age 65 Uterine Ablation age 51 Tubal ligaton age 79 Breast lift and aug 2004 T & A age 42  Family History: Reviewed history from 11/19/2009 and no changes required. Father w/ prostate Ca Sister w/ DM Dad w/ cholesterol Granparents with DM MGF with MI at 47s, smokeer.  Family History of UC: mother,  maternal grandmother Family History of Heart Disease: maternal grandfather Family History of Barretts: Father Family History of Crohn's: Brother No FH of Colon Cancer:  Social History: Reviewed history from 11/19/2009 and no changes required. 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 Married to [place] w/ 3 kids.  1 kid stills live at home.   Former Smoker (25 years about 1/2 ppd on average).  quit in 2007 Alcohol use-yes every other weekend socially  Drug use-no Regular exercise-no Physical Exam General appearance: well developed, well nourished, no acute distress MSE: oriented to time, place, and person R index finger with radial aspect of DIP with swelling, mild redness.  FROM, but  painful.  NO drainage.  Distal sensation and cap refill normal.   Assessment New Problems: ABSCESS, FINGER (ICD-681.00)   Plan New Medications/Changes: BACTRIM DS 800-160 MG TABS (SULFAMETHOXAZOLE-TRIMETHOPRIM) 1 by mouth two times a day for 10 days  #20 x 0, 08/25/2011, Fidela Salisbury MD  New Orders: Est. Patient Level IV [07622] Planning Comments:   Rx for Bactrim DS.  See following procedure note.  No pus retrieved and no FB felt.  Warm compresses, rest.  If not improving or worsening, will need to see hand.  Ibu as needed for pain.   The patient and/or caregiver has been counseled thoroughly with regard to medications prescribed including dosage, schedule, interactions, rationale for use, and possible side effects and they verbalize understanding.  Diagnoses and expected course of recovery discussed and will return if not improved as expected or if the condition worsens. Patient and/or caregiver verbalized understanding.   PROCEDURE:  Lesion Removal Site: R index finger radial aspect DIP Size: 1cm Number of Lesions: 1 Procedure: Risks, benefits and alternatives discussed with patient.  They voice understanding. Cleaned with hibaclens soak, then iodine / alcohol.  Using 1% lidocaine without epi, numbed a wheal of skin.  Using a blade, made tiny nick in skin.  No pus retrieved, no bleeding.  Covered with bandaid. Prescriptions: BACTRIM DS 800-160 MG TABS (SULFAMETHOXAZOLE-TRIMETHOPRIM) 1 by mouth two times a day for 10 days  #20 x 0   Entered and Authorized by:   Fidela Salisbury MD   Signed by:   Fidela Salisbury MD on 08/25/2011   Method used:   Print then Give to Patient   RxID:   [id number]   Orders Added: 1)  Est. Patient Level IV [34287]

## 2011-11-01 NOTE — Telephone Encounter (Signed)
  Phone Note Outgoing Call Call back at Home Phone [phone] [facility]     Call placed by: Georgiann Mccoy,  Apr 14, 2011 7:08 PM Call placed to: Patient Summary of Call: Left msg Call back with any questions or concerns

## 2011-12-07 ENCOUNTER — Encounter: Payer: Self-pay | Admitting: Family Medicine

## 2011-12-07 ENCOUNTER — Ambulatory Visit (INDEPENDENT_AMBULATORY_CARE_PROVIDER_SITE_OTHER): Payer: 59 | Admitting: Family Medicine

## 2011-12-07 ENCOUNTER — Other Ambulatory Visit: Payer: Self-pay

## 2011-12-07 DIAGNOSIS — F32A Depression, unspecified: Secondary | ICD-10-CM

## 2011-12-07 DIAGNOSIS — F419 Anxiety disorder, unspecified: Secondary | ICD-10-CM

## 2011-12-07 DIAGNOSIS — F329 Major depressive disorder, single episode, unspecified: Secondary | ICD-10-CM

## 2011-12-07 DIAGNOSIS — F341 Dysthymic disorder: Secondary | ICD-10-CM

## 2011-12-07 DIAGNOSIS — Z0000 Encounter for general adult medical examination without abnormal findings: Secondary | ICD-10-CM

## 2011-12-07 DIAGNOSIS — E559 Vitamin D deficiency, unspecified: Secondary | ICD-10-CM

## 2011-12-07 LAB — HEPATIC FUNCTION PANEL
ALT: 19 U/L (ref 0–35)
Alkaline Phosphatase: 55 U/L (ref 39–117)
Bilirubin, Direct: 0 mg/dL (ref 0.0–0.3)
Total Bilirubin: 0.6 mg/dL (ref 0.3–1.2)
Total Protein: 7 g/dL (ref 6.0–8.3)

## 2011-12-07 LAB — BASIC METABOLIC PANEL
CO2: 27 mEq/L (ref 19–32)
Chloride: 105 mEq/L (ref 96–112)
Sodium: 139 mEq/L (ref 135–145)

## 2011-12-07 LAB — CBC WITH DIFFERENTIAL/PLATELET
Basophils Absolute: 0 10*3/uL (ref 0.0–0.1)
Eosinophils Absolute: 0.1 10*3/uL (ref 0.0–0.7)
Lymphocytes Relative: 27.9 % (ref 12.0–46.0)
MCHC: 34.4 g/dL (ref 30.0–36.0)
Monocytes Relative: 5.1 % (ref 3.0–12.0)
Neutrophils Relative %: 64.8 % (ref 43.0–77.0)
Platelets: 288 10*3/uL (ref 150.0–400.0)
RDW: 12.9 % (ref 11.5–14.6)

## 2011-12-07 LAB — LIPID PANEL
Cholesterol: 205 mg/dL — ABNORMAL HIGH (ref 0–200)
Total CHOL/HDL Ratio: 3
Triglycerides: 216 mg/dL — ABNORMAL HIGH (ref 0.0–149.0)
VLDL: 43.2 mg/dL — ABNORMAL HIGH (ref 0.0–40.0)

## 2011-12-07 LAB — LDL CHOLESTEROL, DIRECT: Direct LDL: 104.1 mg/dL

## 2011-12-07 MED ORDER — ESCITALOPRAM OXALATE 10 MG PO TABS: 10.0000 mg | ORAL_TABLET | Freq: Every day | ORAL | Status: DC

## 2011-12-07 MED ORDER — CLONAZEPAM 0.5 MG PO TABS: 0.5000 mg | ORAL_TABLET | Freq: Every evening | ORAL | Status: DC | PRN

## 2011-12-07 MED ORDER — ESOMEPRAZOLE MAGNESIUM 40 MG PO CPDR: 40.0000 mg | DELAYED_RELEASE_CAPSULE | Freq: Every day | ORAL | Status: DC

## 2011-12-07 NOTE — Progress Notes (Signed)
  Subjective:    Patient ID: Melissa Cohen, female    DOB: 11/05/1966, 46 y.o.   MRN: [id number]  HPI CPE- UTD on GYN.  No concerns today.  Depression- feels sxs have improved since starting Lexapro.  Taking Klonopin at night for sleep.  Less tearful, feels that life is more manageable.   Review of Systems Patient reports no vision/ hearing changes, adenopathy,fever, weight change,  persistant/recurrent hoarseness , swallowing issues, chest pain, palpitations, edema, persistant/recurrent cough, hemoptysis, dyspnea (rest/exertional/paroxysmal nocturnal), gastrointestinal bleeding (melena, rectal bleeding), abdominal pain, significant heartburn, bowel changes, GU symptoms (dysuria, hematuria, incontinence), Gyn symptoms (abnormal  bleeding, pain),  syncope, focal weakness, memory loss, numbness & tingling, skin/hair/nail changes, abnormal bruising or bleeding, anxiety, or depression.     Objective:   Physical Exam General Appearance:    Alert, cooperative, no distress, appears stated age  Head:    Normocephalic, without obvious abnormality, atraumatic  Eyes:    PERRL, conjunctiva/corneas clear, EOM's intact, fundi    benign, both eyes  Ears:    Normal TM's and external ear canals, both ears  Nose:   Nares normal, septum midline, mucosa normal, no drainage    or sinus tenderness  Throat:   Lips, mucosa, and tongue normal; teeth and gums normal  Neck:   Supple, symmetrical, trachea midline, no adenopathy;    Thyroid: no enlargement/tenderness/nodules  Back:     Symmetric, no curvature, ROM normal, no CVA tenderness  Lungs:     Clear to auscultation bilaterally, respirations unlabored  Chest Wall:    No tenderness or deformity   Heart:    Regular rate and rhythm, S1 and S2 normal, no murmur, rub   or gallop  Breast Exam:    Deferred to GYN  Abdomen:     Soft, non-tender, bowel sounds active all four quadrants,    no masses, no organomegaly  Genitalia:    Deferred to GYN  Rectal:      Extremities:   Extremities normal, atraumatic, no cyanosis or edema  Pulses:   2+ and symmetric all extremities  Skin:   Skin color, texture, turgor normal, no rashes or lesions  Lymph nodes:   Cervical, supraclavicular, and axillary nodes normal  Neurologic:   CNII-XII intact, normal strength, sensation and reflexes    throughout          Assessment & Plan:

## 2011-12-07 NOTE — Patient Instructions (Signed)
Follow up in 6 months We'll notify you of your lab results and make any changes if needed You look great!  Keep up the good work! Call with any questions or concerns Happy New Year!  It's going to be a good year!!!

## 2011-12-09 LAB — VITAMIN D 1,25 DIHYDROXY: Vitamin D 1, 25 (OH)2 Total: 66 pg/mL (ref 18–72)

## 2011-12-10 ENCOUNTER — Other Ambulatory Visit: Payer: Self-pay | Admitting: *Deleted

## 2011-12-10 ENCOUNTER — Encounter: Payer: Self-pay | Admitting: *Deleted

## 2011-12-10 MED ORDER — FENOFIBRATE 160 MG PO TABS: 160.0000 mg | ORAL_TABLET | Freq: Every day | ORAL | Status: DC

## 2011-12-10 NOTE — Telephone Encounter (Signed)
Fax sent to [facility] to verify order for #90 with 3 refills for lexapro, mobic 20m daily #90 with 3 refills, order signed by MD TBirdie Riddleand faxed

## 2011-12-14 NOTE — Assessment & Plan Note (Signed)
Pt's PE WNL.  UTD on GYN.  Check labs.  Anticipatory guidance provided.  

## 2011-12-14 NOTE — Assessment & Plan Note (Signed)
Improved since starting SSRI.  Has not started counseling.  Still considering this.  Will continue to follow.

## 2011-12-15 ENCOUNTER — Telehealth: Payer: Self-pay | Admitting: *Deleted

## 2011-12-15 MED ORDER — FENOFIBRATE 160 MG PO TABS: 160.0000 mg | ORAL_TABLET | Freq: Every day | ORAL | Status: DC

## 2011-12-15 NOTE — Telephone Encounter (Signed)
Pt left vm stating she updated her new number [phone] with our receptionist today, she wants a rx for the new medication we sent in the mail for her, for 90 day supply to med center per it will be cheaper for her, sent fenofibrite 145m to med center for #90 with 1 refill, left vm for pt to advise that the rx has been sent per verbal permission to leave message on phone given in vm

## 2012-01-05 ENCOUNTER — Encounter: Payer: Self-pay | Admitting: Gastroenterology

## 2012-02-09 ENCOUNTER — Telehealth: Payer: Self-pay

## 2012-02-09 ENCOUNTER — Ambulatory Visit (AMBULATORY_SURGERY_CENTER): Payer: 59

## 2012-02-09 VITALS — Ht 65.0 in | Wt 178.0 lb

## 2012-02-09 DIAGNOSIS — Z1211 Encounter for screening for malignant neoplasm of colon: Secondary | ICD-10-CM

## 2012-02-09 MED ORDER — PEG-KCL-NACL-NASULF-NA ASC-C 100 G PO SOLR: 1.0000 | Freq: Once | ORAL | Status: DC

## 2012-02-09 NOTE — Telephone Encounter (Signed)
Pt wants egd added to 02/15/12

## 2012-02-10 NOTE — Telephone Encounter (Signed)
Ok to schedule EGD as well

## 2012-02-10 NOTE — Telephone Encounter (Signed)
Spoke with pt and informed of Dr. Kelby Fam ok to do an EGD.  Pt is made aware that the 22nd is not a Propofol day for Dr. Fuller Plan.  Writer offered to change appointment to a different day in April and pt states that she cannot change for insurance reasons.  Pt is aware that Propofol will not be used but Fentanyl and Versed will be used

## 2012-02-10 NOTE — Telephone Encounter (Signed)
Dr. Deatra Ina, I am sending this to you as doc of the day since Dr. Fuller Plan is on vacation.  This pt had a PV yesterday.  She is having a colonoscopy for hx of polyps and UC.  She also told the [facility] nurse that she is having GERD problems.  Looking at her chart, she had an office visit with Dr. Fuller Plan last year in October for these symptoms.  At that time, her symptoms were improved.  Now she requests an EGD because they have worsened.  Please advise if this is ok or if she should have an OV with him first.  Thanks,  Cyril Mourning

## 2012-02-15 ENCOUNTER — Encounter: Payer: 59 | Admitting: Gastroenterology

## 2012-02-16 ENCOUNTER — Telehealth: Payer: Self-pay | Admitting: Family Medicine

## 2012-02-16 NOTE — Telephone Encounter (Signed)
Spoke with Pt who states that she is schedule for colonoscopy on Friday and will start the prep tomorrow so she will not be able to come in until next week. Pt also states that she would prefer to see Dr Birdie Riddle since she is aware of the situation. Pt is scheduled for 02-23-12 earliest 30 min appt available on Dr Birdie Riddle schedule. .Please advise if Pt can be seen earlier.

## 2012-02-16 NOTE — Telephone Encounter (Signed)
Caller: [place]/Patient is calling with a question about Lexapro.The medication was written by Midge Minium Reports weaned off Lexapro over 2 wks d/t 15 lb wt gain and pedal edema. Since stopped Lexapro, has lost #11 lbs.  Asking for different antidepressant d/t continues to have stressors. States had forced resignation/ fired 10 days ago; insurance runs out at end of March.  Continues to have issues at home with partner.  Cries easily.  Used Welbutrin on past. Has called counselor.  Hx BTL. Advised to see MD within 24 for > feelings of despondency and hopelessness per Depression Guideline.  Does not want to see Dr Larose Kells 02/16/12; wants to see Dr Birdie Riddle who knows her situation.  Is unable to be seen  02/17/12 d/t eye laser surgery and has colonoscopy 02/18/12.  Request MD be asked about RX without appt.   [place] [phone]. OFFICE PLEASE CALL BACK.

## 2012-02-16 NOTE — Telephone Encounter (Signed)
We will not start meds without seeing her.  She can see whoever doc of day is this week or wait but I advise she be seen

## 2012-02-17 NOTE — Telephone Encounter (Signed)
If that is what pt wanted , its ok.l

## 2012-02-18 ENCOUNTER — Ambulatory Visit (AMBULATORY_SURGERY_CENTER): Payer: 59 | Admitting: Gastroenterology

## 2012-02-18 ENCOUNTER — Encounter: Payer: Self-pay | Admitting: Gastroenterology

## 2012-02-18 VITALS — BP 101/51 | HR 71 | Temp 97.5°F | Resp 19 | Ht 65.0 in | Wt 178.0 lb

## 2012-02-18 DIAGNOSIS — K5190 Ulcerative colitis, unspecified, without complications: Secondary | ICD-10-CM

## 2012-02-18 DIAGNOSIS — K219 Gastro-esophageal reflux disease without esophagitis: Secondary | ICD-10-CM

## 2012-02-18 DIAGNOSIS — Z1211 Encounter for screening for malignant neoplasm of colon: Secondary | ICD-10-CM

## 2012-02-18 MED ORDER — SODIUM CHLORIDE 0.9 % IV SOLN: 500.0000 mL | INTRAVENOUS | Status: DC

## 2012-02-18 NOTE — Progress Notes (Signed)
Pt status post right eye lasix surgery from yesterday.  She had follow this AM with Dr. Gordan Payment and the pt is not having any problems.  maw

## 2012-02-18 NOTE — Op Note (Signed)
[place] [address] [place], [place]  [id number]  COLONOSCOPY PROCEDURE REPORT  PATIENT:  Melissa Cohen, Melissa Cohen  MR#:  [id number] BIRTHDATE:  02-Jan-1966, 45 yrs. old  GENDER:  female ENDOSCOPIST:  Norberto Sorenson T. Fuller Plan, MD, [facility]  PROCEDURE DATE:  02/18/2012 PROCEDURE:  Colonoscopy with biopsy ASA CLASS:  Class II INDICATIONS:  1) surveillance and high-risk screening  2) evaluation of chronic ulcerative colitis MEDICATIONS:   These medications were titrated to patient response per physician's verbal order, Fentanyl 125 mcg IV, Versed 12 mg IV DESCRIPTION OF PROCEDURE:   After the risks benefits and alternatives of the procedure were thoroughly explained, informed consent was obtained.  Digital rectal exam was performed and revealed no abnormalities.   The LB [id number] endoscope was introduced through the anus and advanced to the cecum, which was identified by both the appendix and ileocecal valve, without limitations.  The quality of the prep was good, using MoviPrep. The instrument was then slowly withdrawn as the colon was fully examined. <<PROCEDUREIMAGES>> FINDINGS:  There were mucosal changes consistent with universal ulcerative colitis throughout the colon. They were patchy, granular and erythematous. Many areas with scarring and loss of vascular pattern. Random biopsies were obtained and sent to pathology.  There were multiple pseudoolyps in the rectum and sigmoid colon. Several biopsied. Otherwise normal colonoscopy without other polyps, masses, vascular ectasias, or inflammatory changes. Retroflexed views in the rectum revealed no abnormalities. The time to cecum =  2.5  minutes. The scope was then withdrawn (time =  7.67  min) from the patient and the procedure completed.  COMPLICATIONS:  None  ENDOSCOPIC IMPRESSION: 1) Colitis- universal UC throughout the colon 2) Pseudopolyps, multiple in the rectum and sigmoid colon  RECOMMENDATIONS: 1) Await  pathology results 2) Consdier sedation with MAC for future procedures 3) Repeat Colonoscopy in 2 years 4) 5-ASA long term  Orian Amberg T. Fuller Plan, MD, [facility]  n. eSIGNED:   Pricilla Riffle. Tayte Childers at 02/18/2012 02:29 PM  Delane Ginger, [phone]

## 2012-02-18 NOTE — Op Note (Signed)
[facility] [address] [place], [place]  [id number]  ENDOSCOPY PROCEDURE REPORT  PATIENT:  Melissa Cohen, Melissa Cohen  MR#:  [id number] BIRTHDATE:  1965/12/16, 45 yrs. old  GENDER:  female ENDOSCOPIST:  Norberto Sorenson T. Fuller Plan, MD, [facility]  PROCEDURE DATE:  02/18/2012 PROCEDURE:  EGD, diagnostic 43235 ASA CLASS:  Class II INDICATIONS:  GERD MEDICATIONS:  There was residual sedation effect present from prior procedure., These medications were titrated to patient response per physician's verbal order, Fentanyl 25 mcg, Versed 3 mg IV TOPICAL ANESTHETIC:  Cetacaine Spray DESCRIPTION OF PROCEDURE:   After the risks benefits and alternatives of the procedure were thoroughly explained, informed consent was obtained.  The LB GIF-H180 [id number] endoscope was introduced through the mouth and advanced to the second portion of the duodenum, without limitations.  The instrument was slowly withdrawn as the mucosa was fully examined. <<PROCEDUREIMAGES>> The esophagus and gastroesophageal junction were completely normal in appearance.  The stomach was entered and closely examined. The pylorus, antrum, angularis, and lesser curvature were well visualized, including a retroflexed view of the cardia and fundus. The stomach wall was normally distensable. The scope passed easily through the pylorus into the duodenum.  The duodenal bulb was normal in appearance, as was the postbulbar duodenum. Retroflexed views revealed a hiatal hernia., small.  The scope was then withdrawn from the patient and the procedure completed.  COMPLICATIONS:  None  ENDOSCOPIC IMPRESSION: 1) Hiatal hernia  RECOMMENDATIONS: 1) Anti-reflux regimen 2) PPI qam  Amylynn Fano T. Fuller Plan, MD, [facility]  n. eSIGNED:   Pricilla Riffle. Reymond Maynez at 02/18/2012 02:34 PM  Delane Ginger, [phone]

## 2012-02-18 NOTE — Patient Instructions (Addendum)
Patient did not experience any of the following events: a burn prior to discharge; a fall within the facility; wrong site/side/patient/procedure/implant event; or a hospital transfer or hospital admission upon discharge from the facility. [phone])    Please take your 5-ASA as directed for long term.  We need to see you again in 2 years for another colonoscopy..   You might want to consider propofol for further procedures.   Resume your routine medications.YOU HAD AN ENDOSCOPIC PROCEDURE TODAY AT [facility]: Refer to the procedure report that was given to you for any specific questions about what was found during the examination.  If the procedure report does not answer your questions, please call your gastroenterologist to clarify.  If you requested that your care partner not be given the details of your procedure findings, then the procedure report has been included in a sealed envelope for you to review at your convenience later.  YOU SHOULD EXPECT: Some feelings of bloating in the abdomen. Passage of more gas than usual.  Walking can help get rid of the air that was put into your GI tract during the procedure and reduce the bloating. If you had a lower endoscopy (such as a colonoscopy or flexible sigmoidoscopy) you may notice spotting of blood in your stool or on the toilet paper. If you underwent a bowel prep for your procedure, then you may not have a normal bowel movement for a few days.  DIET: Your first meal following the procedure should be a light meal and then it is ok to progress to your normal diet.  A half-sandwich or bowl of soup is an example of a good first meal.  Heavy or fried foods are harder to digest and may make you feel nauseous or bloated.  Likewise meals heavy in dairy and vegetables can cause extra gas to form and this can also increase the bloating.  Drink plenty of fluids but you should avoid alcoholic beverages for 24 hours.  ACTIVITY: Your care partner should  take you home directly after the procedure.  You should plan to take it easy, moving slowly for the rest of the day.  You can resume normal activity the day after the procedure however you should NOT DRIVE or use heavy machinery for 24 hours (because of the sedation medicines used during the test).    SYMPTOMS TO REPORT IMMEDIATELY: A gastroenterologist can be reached at any hour.  During normal business hours, 8:30 AM to 5:00 PM Monday through Friday, call [phone].  After hours and on weekends, please call the GI answering service at [phone] who will take a message and have the physician on call contact you.   Following lower endoscopy (colonoscopy or flexible sigmoidoscopy):  Excessive amounts of blood in the stool  Significant tenderness or worsening of abdominal pains  Swelling of the abdomen that is new, acute  Fever of 100F or higher  Following upper endoscopy (EGD)  Vomiting of blood or coffee ground material  New chest pain or pain under the shoulder blades  Painful or persistently difficult swallowing  New shortness of breath  Fever of 100F or higher  Black, tarry-looking stools  FOLLOW UP: If any biopsies were taken you will be contacted by phone or by letter within the next 1-3 weeks.  Call your gastroenterologist if you have not heard about the biopsies in 3 weeks.  Our staff will call the home number listed on your records the next business day following your  procedure to check on you and address any questions or concerns that you may have at that time regarding the information given to you following your procedure. This is a courtesy call and so if there is no answer at the home number and we have not heard from you through the emergency physician on call, we will assume that you have returned to your regular daily activities without incident.  SIGNATURES/CONFIDENTIALITY: You and/or your care partner have signed paperwork which will be entered into your  electronic medical record.  These signatures attest to the fact that that the information above on your After Visit Summary has been reviewed and is understood.  Full responsibility of the confidentiality of this discharge information lies with you and/or your care-partner.

## 2012-02-18 NOTE — Progress Notes (Signed)
Patient did not have preoperative order for IV antibiotic SSI prophylaxis. (G8918)  Patient did not experience any of the following events: a burn prior to discharge; a fall within the facility; wrong site/side/patient/procedure/implant event; or a hospital transfer or hospital admission upon discharge from the facility. (G8907)  

## 2012-02-20 NOTE — Telephone Encounter (Signed)
Ok to pull 2 slots together if any available.  Otherwise, will need to wait until 3/27

## 2012-02-21 ENCOUNTER — Telehealth: Payer: Self-pay

## 2012-02-21 NOTE — Telephone Encounter (Signed)
Called pt to see if she wanted to wait til 02-23-12 per noted MD Tabori half day tomorrow pt stated that she can wait til 02-23-12. Pt understood and will come in office for scheduled apt

## 2012-02-21 NOTE — Telephone Encounter (Signed)
  Follow up Call-  Call back number 02/18/2012  Post procedure Call Back phone  # [phone] cell  Permission to leave phone message Yes     Patient questions:  Do you have a fever, pain , or abdominal swelling? no Pain Score  0 *  Have you tolerated food without any problems? yes  Have you been able to return to your normal activities? yes  Do you have any questions about your discharge instructions: Diet   no Medications  no Follow up visit  no  Do you have questions or concerns about your Care? no  Actions: * If pain score is 4 or above: No action needed, pain <4.  Per the pt " I had no problems". Maw

## 2012-02-22 ENCOUNTER — Encounter: Payer: Self-pay | Admitting: Gastroenterology

## 2012-02-23 ENCOUNTER — Ambulatory Visit (INDEPENDENT_AMBULATORY_CARE_PROVIDER_SITE_OTHER): Payer: 59 | Admitting: Family Medicine

## 2012-02-23 ENCOUNTER — Encounter: Payer: Self-pay | Admitting: Family Medicine

## 2012-02-23 VITALS — BP 124/80 | HR 87 | Temp 98.1°F | Ht 64.0 in | Wt 174.6 lb

## 2012-02-23 DIAGNOSIS — F419 Anxiety disorder, unspecified: Secondary | ICD-10-CM

## 2012-02-23 DIAGNOSIS — F341 Dysthymic disorder: Secondary | ICD-10-CM

## 2012-02-23 DIAGNOSIS — F32A Depression, unspecified: Secondary | ICD-10-CM

## 2012-02-23 MED ORDER — VENLAFAXINE HCL ER 37.5 MG PO CP24: 37.5000 mg | ORAL_CAPSULE | Freq: Every day | ORAL | Status: DC

## 2012-02-23 NOTE — Progress Notes (Signed)
  Subjective:    Patient ID: Melissa Cohen, female    DOB: 09-Mar-1966, 46 y.o.   MRN: [id number]  HPI Depression- weaned off Lexapro due to edema, weight was up to 188.  Mood worsened after stopping med.  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  Still having difficulty w/ partner at home- today gave her the ultimatum, counseling or break up.  Currently in therapy.  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  Denies SI/HI.   Review of Systems For ROS see HPI     Objective:   Physical Exam  Vitals reviewed. Constitutional: She appears well-developed and well-nourished.       Appropriately tearful when talking about loss of job and difficulties w/ relationship  Psychiatric: She has a normal mood and affect. Her behavior is normal. Judgment and thought content normal.          Assessment & Plan:

## 2012-02-23 NOTE — Patient Instructions (Signed)
Follow up in 1 month if still here, otherwise call! I'm so proud of you!!! Try and be strong- change is hard but you can do this! Call with any questions or concerns Hang in there!!!
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
## 2012-03-09 ENCOUNTER — Telehealth: Payer: Self-pay

## 2012-03-09 MED ORDER — OMEPRAZOLE 40 MG PO CPDR: 40.0000 mg | DELAYED_RELEASE_CAPSULE | Freq: Every day | ORAL | Status: DC

## 2012-03-09 NOTE — Telephone Encounter (Signed)
Prescription sent per pharmacy's request for something temporarily cheaper for patient.

## 2012-04-19 ENCOUNTER — Telehealth: Payer: Self-pay

## 2012-04-19 MED ORDER — CLONAZEPAM 0.5 MG PO TABS: 0.5000 mg | ORAL_TABLET | Freq: Every evening | ORAL | Status: DC | PRN

## 2012-04-19 MED ORDER — VENLAFAXINE HCL ER 75 MG PO CP24: 75.0000 mg | ORAL_CAPSULE | Freq: Every day | ORAL | Status: DC

## 2012-04-19 NOTE — Telephone Encounter (Signed)
.  rx faxed to pharmacy, manually for Klonopin and escribe for the Effexor XR 3m #30 with 6 refills [facility]

## 2012-04-19 NOTE — Telephone Encounter (Signed)
Message left on Triage voicemail: Patient with question about Effexor, please call to discuss

## 2012-04-19 NOTE — Telephone Encounter (Signed)
Please note addition to note, refill request for Klonopin last OV noted 02-23-12, last refill 12-07-11 #30 with 3 refills

## 2012-04-19 NOTE — Telephone Encounter (Signed)
Ok to change script to 62m daily, #30, 6 refills

## 2012-04-19 NOTE — Telephone Encounter (Signed)
Ok for [employer] #30, 3 refills

## 2012-04-19 NOTE — Telephone Encounter (Signed)
Called pt to discuss Effexor, noted that MD Tabori advised that she may have to increase it, pt noted that she increased to 77m for about a month and it seems to be working for her and wants to have a new rx sent per running out per doubled the original dosage, pt uses [facility]

## 2012-05-16 ENCOUNTER — Telehealth: Payer: Self-pay | Admitting: Family Medicine

## 2012-05-16 MED ORDER — VENLAFAXINE HCL ER 75 MG PO CP24: 75.0000 mg | ORAL_CAPSULE | Freq: Every day | ORAL | Status: DC

## 2012-05-16 NOTE — Telephone Encounter (Signed)
Noted last refill sent to [facility] for #30 with 5 refills with correct RX, however pt notes wants a #90 day supply, sent #90 to [facility] outpatient via escribe

## 2012-05-16 NOTE — Telephone Encounter (Signed)
refill Effexor XR 75MG 24hr, needs 90 day supply Please update script and re-send

## 2012-06-12 ENCOUNTER — Other Ambulatory Visit: Payer: Self-pay | Admitting: Gastroenterology

## 2012-12-07 ENCOUNTER — Other Ambulatory Visit: Payer: Self-pay | Admitting: *Deleted

## 2012-12-07 DIAGNOSIS — M1990 Unspecified osteoarthritis, unspecified site: Secondary | ICD-10-CM

## 2012-12-07 MED ORDER — CELECOXIB 200 MG PO CAPS: 200.0000 mg | ORAL_CAPSULE | Freq: Every day | ORAL | Status: DC

## 2012-12-07 NOTE — Telephone Encounter (Signed)
Refill for Celebrex sent to [facility], pt has appt on 01/25/13 and is on wait list should something earlier come available.

## 2013-01-09 ENCOUNTER — Encounter: Payer: Self-pay | Admitting: Lab

## 2013-01-10 ENCOUNTER — Ambulatory Visit (INDEPENDENT_AMBULATORY_CARE_PROVIDER_SITE_OTHER): Payer: BC Managed Care – PPO | Admitting: Family Medicine

## 2013-01-10 ENCOUNTER — Encounter: Payer: Self-pay | Admitting: Family Medicine

## 2013-01-10 VITALS — BP 130/80 | HR 77 | Temp 98.1°F | Ht 64.75 in | Wt 181.4 lb

## 2013-01-10 DIAGNOSIS — Z0000 Encounter for general adult medical examination without abnormal findings: Secondary | ICD-10-CM

## 2013-01-10 LAB — BASIC METABOLIC PANEL
BUN: 9 mg/dL (ref 6–23)
Calcium: 9.4 mg/dL (ref 8.4–10.5)
Creatinine, Ser: 0.7 mg/dL (ref 0.4–1.2)
GFR: 99.62 mL/min (ref >60.00)
Glucose, Bld: 77 mg/dL (ref 70–99)
Potassium: 3.8 mEq/L (ref 3.5–5.1)

## 2013-01-10 LAB — HEPATIC FUNCTION PANEL
AST: 17 U/L (ref 0–37)
Albumin: 4.2 g/dL (ref 3.5–5.2)
Total Bilirubin: 0.4 mg/dL (ref 0.3–1.2)

## 2013-01-10 LAB — CBC WITH DIFFERENTIAL/PLATELET
Basophils Relative: 0.7 % (ref 0.0–3.0)
Eosinophils Absolute: 0.1 10*3/uL (ref 0.0–0.7)
Eosinophils Relative: 1.4 % (ref 0.0–5.0)
HCT: 41.5 % (ref 36.0–46.0)
Lymphs Abs: 1.9 10*3/uL (ref 0.7–4.0)
MCHC: 34.1 g/dL (ref 30.0–36.0)
MCV: 89.7 fl (ref 78.0–100.0)
Monocytes Absolute: 0.4 10*3/uL (ref 0.1–1.0)
Neutrophils Relative %: 55.6 % (ref 43.0–77.0)
RBC: 4.63 Mil/uL (ref 3.87–5.11)

## 2013-01-10 LAB — LIPID PANEL
Cholesterol: 160 mg/dL (ref 0–200)
HDL: 45 mg/dL (ref >39.00)
Triglycerides: 148 mg/dL (ref 0.0–149.0)
VLDL: 29.6 mg/dL (ref 0.0–40.0)

## 2013-01-10 LAB — TSH: TSH: 1.3 u[IU]/mL (ref 0.35–5.50)

## 2013-01-10 NOTE — Progress Notes (Signed)
  Subjective:    Patient ID: Melissa Cohen, female    DOB: May 14, 1966, 47 y.o.   MRN: [id number]  HPI CPE- UTD on GYN ([facility]), Colonoscopy, overdue for mammo.   Review of Systems Patient reports no vision/ hearing changes, adenopathy,fever, weight change,  persistant/recurrent hoarseness , swallowing issues, chest pain, palpitations, edema, persistant/recurrent cough, hemoptysis, dyspnea (rest/exertional/paroxysmal nocturnal), gastrointestinal bleeding (melena, rectal bleeding), abdominal pain, significant heartburn, bowel changes, GU symptoms (dysuria, hematuria, incontinence), Gyn symptoms (abnormal  bleeding, pain),  syncope, focal weakness, memory loss, numbness & tingling, skin/hair/nail changes, abnormal bruising or bleeding, anxiety, or depression.     Objective:   Physical Exam General Appearance:    Alert, cooperative, no distress, appears stated age  Head:    Normocephalic, without obvious abnormality, atraumatic  Eyes:    PERRL, conjunctiva/corneas clear, EOM's intact, fundi    benign, both eyes  Ears:    Normal TM's and external ear canals, both ears  Nose:   Nares normal, septum midline, mucosa normal, no drainage    or sinus tenderness  Throat:   Lips, mucosa, and tongue normal; teeth and gums normal  Neck:   Supple, symmetrical, trachea midline, no adenopathy;    Thyroid: no enlargement/tenderness/nodules  Back:     Symmetric, no curvature, ROM normal, no CVA tenderness  Lungs:     Clear to auscultation bilaterally, respirations unlabored  Chest Wall:    No tenderness or deformity   Heart:    Regular rate and rhythm, S1 and S2 normal, no murmur, rub   or gallop  Breast Exam:    Deferred to GYN  Abdomen:     Soft, non-tender, bowel sounds active all four quadrants,    no masses, no organomegaly  Genitalia:    Deferred to GYN  Rectal:    Extremities:   Extremities normal, atraumatic, no cyanosis or edema  Pulses:   2+ and symmetric all extremities  Skin:   Skin  color, texture, turgor normal, no rashes or lesions  Lymph nodes:   Cervical, supraclavicular, and axillary nodes normal  Neurologic:   CNII-XII intact, normal strength, sensation and reflexes    throughout          Assessment & Plan:

## 2013-01-10 NOTE — Patient Instructions (Addendum)
We'll notify you of your lab results and make any changes if needed Keep up the good work! Call with any questions or concerns Enjoy the snow!!!

## 2013-01-15 LAB — VITAMIN D 1,25 DIHYDROXY: Vitamin D 1, 25 (OH)2 Total: 48 pg/mL (ref 18–72)

## 2013-01-15 NOTE — Assessment & Plan Note (Signed)
Pt's PE WNL.  UTD on GYN, overdue on mammo- strongly encouraged her to schedule.  EKG done- see document for interpretation.  Check labs.  Anticipatory guidance provided.

## 2013-01-17 ENCOUNTER — Encounter: Payer: Self-pay | Admitting: *Deleted

## 2013-02-22 ENCOUNTER — Encounter: Payer: 59 | Admitting: Family Medicine

## 2013-05-22 ENCOUNTER — Encounter: Payer: Self-pay | Admitting: Family Medicine

## 2013-05-22 ENCOUNTER — Ambulatory Visit (INDEPENDENT_AMBULATORY_CARE_PROVIDER_SITE_OTHER): Payer: BC Managed Care – PPO | Admitting: Family Medicine

## 2013-05-22 VITALS — BP 102/80 | HR 104 | Temp 99.0°F | Ht 64.75 in | Wt 182.6 lb

## 2013-05-22 DIAGNOSIS — J069 Acute upper respiratory infection, unspecified: Secondary | ICD-10-CM | POA: Insufficient documentation

## 2013-05-22 MED ORDER — PROMETHAZINE-DM 6.25-15 MG/5ML PO SYRP: 5.0000 mL | ORAL_SOLUTION | Freq: Four times a day (QID) | ORAL | Status: DC | PRN

## 2013-05-22 NOTE — Progress Notes (Signed)
  Subjective:    Patient ID: Melissa Cohen, female    DOB: 1966-05-26, 47 y.o.   MRN: [id number]  HPI URI- sxs started Sunday w/ PND.  Developed fever by Sunday night.  + cough, body aches.  Now w/ sore throat, wet but not productive cough.  No facial pain/pressure.  + nasal congestion.  Mild ear fullness.  No N/V/D.  + sick contacts.   Review of Systems For ROS see HPI     Objective:   Physical Exam  Vitals reviewed. Constitutional: She appears well-developed and well-nourished. No distress.  HENT:  Head: Normocephalic and atraumatic.  TMs normal bilaterally Mild nasal congestion Throat w/out erythema, edema, or exudate  Eyes: Conjunctivae and EOM are normal. Pupils are equal, round, and reactive to light.  Neck: Normal range of motion. Neck supple.  Cardiovascular: Normal rate, regular rhythm, normal heart sounds and intact distal pulses.   No murmur heard. Pulmonary/Chest: Effort normal and breath sounds normal. No respiratory distress. She has no wheezes.  + hacking cough  Lymphadenopathy:    She has no cervical adenopathy.          Assessment & Plan:

## 2013-05-22 NOTE — Addendum Note (Signed)
Addended by: Harl Bowie on: 05/22/2013 01:33 PM   Modules accepted: Orders

## 2013-05-22 NOTE — Patient Instructions (Signed)
This is a viral illness Drink plenty of fluids Alternate tylenol/ibuprofen as needed for pain/fever REST! Mucinex DM for daytime cough Cough syrup as needed- will cause drowsiness Call with any questions or concerns Hang in there!

## 2013-05-22 NOTE — Assessment & Plan Note (Signed)
New.  No evidence of bacterial infxn.  Cough syrup prn.  Reviewed supportive care and red flags that should prompt return.  Pt expressed understanding and is in agreement w/ plan.

## 2013-05-24 ENCOUNTER — Telehealth: Payer: Self-pay | Admitting: Family Medicine

## 2013-05-24 ENCOUNTER — Encounter: Payer: Self-pay | Admitting: Family Medicine

## 2013-05-24 ENCOUNTER — Ambulatory Visit (INDEPENDENT_AMBULATORY_CARE_PROVIDER_SITE_OTHER): Payer: BC Managed Care – PPO | Admitting: Family Medicine

## 2013-05-24 VITALS — BP 108/80 | HR 114 | Temp 99.9°F | Ht 64.75 in | Wt 182.6 lb

## 2013-05-24 DIAGNOSIS — J209 Acute bronchitis, unspecified: Secondary | ICD-10-CM | POA: Insufficient documentation

## 2013-05-24 MED ORDER — PREDNISONE 10 MG PO TABS: ORAL_TABLET | ORAL | Status: DC

## 2013-05-24 MED ORDER — METHYLPREDNISOLONE ACETATE 80 MG/ML IJ SUSP
80.0000 mg | Freq: Once | INTRAMUSCULAR | Status: AC
  Administered 2013-05-24: 80 mg via INTRAMUSCULAR

## 2013-05-24 MED ORDER — IPRATROPIUM BROMIDE 0.02 % IN SOLN
0.5000 mg | Freq: Once | RESPIRATORY_TRACT | Status: AC
  Administered 2013-05-24: 0.5 mg via RESPIRATORY_TRACT

## 2013-05-24 MED ORDER — GUAIFENESIN-CODEINE 100-10 MG/5ML PO SYRP: 10.0000 mL | ORAL_SOLUTION | Freq: Three times a day (TID) | ORAL | Status: DC | PRN

## 2013-05-24 MED ORDER — ALBUTEROL SULFATE (5 MG/ML) 0.5% IN NEBU
2.5000 mg | INHALATION_SOLUTION | Freq: Once | RESPIRATORY_TRACT | Status: AC
  Administered 2013-05-24: 2.5 mg via RESPIRATORY_TRACT

## 2013-05-24 MED ORDER — ALBUTEROL SULFATE HFA 108 (90 BASE) MCG/ACT IN AERS: 2.0000 | INHALATION_SPRAY | RESPIRATORY_TRACT | Status: DC | PRN

## 2013-05-24 MED ORDER — AZITHROMYCIN 250 MG PO TABS: ORAL_TABLET | ORAL | Status: DC

## 2013-05-24 NOTE — Telephone Encounter (Signed)
Patient Information:  Caller Name: Inaaya  Phone: [phone]  Patient: Melissa Cohen, Melissa Cohen  Gender: Female  DOB: [birth year]  Age: 47 Years  PCP: Midge Minium  Pregnant: No  Office Follow Up:  Does the office need to follow up with this patient?: No  Instructions For The Office: N/A  RN Note:  Ongoing "raw" sore throat with moist cough. Pain rated 7/10. "Can hardly drink anything" due to throat swelling and burning sensation when swallows. Visible white spots on pharynx.  Drank approximately 36 oz fluid in last 24 hours. Voided small amount between 0200 - 0500. Temp unknown; constantly using Tylenol or Motrin.  Exposed to nephew in past 10 days diagnosed with strep throat.  Symptoms  Reason For Call & Symptoms: Ongoing and worsening sore throut with ongoing cough.  Reviewed Health History In EMR: Yes  Reviewed Medications In EMR: Yes  Reviewed Allergies In EMR: Yes  Reviewed Surgeries / Procedures: Yes  Date of Onset of Symptoms: 05/20/2013  Treatments Tried: Tylenol, Ibuprofen, Mucinex DM, Delsym, Sudafed  Treatments Tried Worked: No OB / GYN:  LMP: 04/29/2013  Guideline(s) Used:  Sore Throat  Disposition Per Guideline:   See Today in Office  Reason For Disposition Reached:   Severe sore throat pain  Advice Given:  For Relief of Sore Throat Pain:  Sip warm chicken broth or apple juice.  Suck on hard candy or a throat lozenge (over-the-counter).  Gargle warm salt water 3 times daily (1 teaspoon of salt in 8 oz or 240 ml of warm water).  Soft Diet:   Cold drinks and milk shakes are especially good (Reason: swollen tonsils can make some foods hard to swallow).  Liquids:  Adequate liquid intake is important to prevent dehydration. Drink 6-8 glasses of water per day.  Expected Course:  Sore throats with viral illnesses usually last 3 or 4 days.  Contagiousness:   You can return to work or school after the fever is gone and you feel well enough to participate in  normal activities. If your doctor determines that you have Strep throat, then you will need to take an antibiotic for 24 hours before you can return.  Patient Will Follow Care Advice:  YES  Appointment Scheduled:  05/24/2013 11:30:00 Appointment Scheduled Provider:  Midge Minium.

## 2013-05-24 NOTE — Telephone Encounter (Signed)
Appointment Scheduled:  05/24/2013 11:30:00  Appointment Scheduled Provider:  Midge Minium.

## 2013-05-24 NOTE — Progress Notes (Signed)
  Subjective:    Patient ID: Melissa Cohen, female    DOB: 12/15/1965, 47 y.o.   MRN: [id number]  HPI Cough- seen on 6/24 and dx'd w/ viral illness.  Cough is worsening since then.  Using cough syrup w/out relief (promethazine, delsym, mucinex).  Having SOB w/ coughing spells and bronchospasm.  'it's like a can't catch my breath for 20-30 seconds'.  Pt very anxious, tearful in office.  No fevers.  + sore throat.  'it's so painful to swallow'.  No N/V/D.   Review of Systems For ROS see HPI     Objective:   Physical Exam  Vitals reviewed. Constitutional: She appears well-developed and well-nourished. No distress.  HENT:  Head: Normocephalic and atraumatic.  TMs normal bilaterally Mild nasal congestion Throat w/out erythema, edema, or exudate  Eyes: Conjunctivae and EOM are normal. Pupils are equal, round, and reactive to light.  Neck: Normal range of motion. Neck supple.  Cardiovascular: Normal rate, regular rhythm, normal heart sounds and intact distal pulses.   No murmur heard. Pulmonary/Chest: Effort normal and breath sounds normal. No respiratory distress. She has no wheezes. She has no rales.  Nearly continuous, dry, hacking cough  Lymphadenopathy:    She has no cervical adenopathy.  Psychiatric:  Anxious, tearful          Assessment & Plan:

## 2013-05-24 NOTE — Patient Instructions (Addendum)
Start the Prednisone in the AM Use the inhaler- 2 puffs as needed for cough/shortness of breath Start the Zpack today Use the codeine cough syrup as needed REST! Hang in there!

## 2013-05-25 NOTE — Assessment & Plan Note (Signed)
New.  Pt is having nearly continuous cough w/ normal lung exam.  Suspect upper airway inflammation and bronchospasm.  Pt got lightheaded w/ neb tx and had subsequent panic attack in office.  Start steroids, albuterol inhaler PRN, cough syrup.  Encouraged her to take slow, deep breaths and try to calm down as her anxiety is worsening her breathing.  Reviewed supportive care and red flags that should prompt return.  Pt expressed understanding and is in agreement w/ plan.

## 2013-05-30 ENCOUNTER — Telehealth: Payer: Self-pay | Admitting: Family Medicine

## 2013-05-30 MED ORDER — NYSTATIN 100000 UNIT/ML MT SUSP: 500000.0000 [IU] | Freq: Three times a day (TID) | OROMUCOSAL | Status: DC

## 2013-05-30 NOTE — Telephone Encounter (Signed)
Patient states that she has started taking the antibiotic that she was given at her last appointment and feels that she now has flesh in her mouth. Please advise.

## 2013-05-30 NOTE — Telephone Encounter (Signed)
Pt should start Nystatin suspension 100,000 units/ml 105m swish and spit- TID until sxs improve.  OMytonfor 4843m

## 2013-05-30 NOTE — Telephone Encounter (Signed)
Pt c/o difficulty swallowing, and pain on tongue and mouth. Pt has painful white/red patches on her tongue.Pt states that she just finished z-pak on yesterday.Please advise on possible thrush in her mouth Pt uses [facility].

## 2013-05-30 NOTE — Telephone Encounter (Signed)
Rx sent Discuss with patient

## 2013-06-06 ENCOUNTER — Other Ambulatory Visit: Payer: Self-pay | Admitting: Family Medicine

## 2013-06-06 NOTE — Telephone Encounter (Signed)
Ok to refill? Last OV 6.26.14 Last filled 6.26.14

## 2013-07-10 ENCOUNTER — Telehealth: Payer: Self-pay | Admitting: Family Medicine

## 2013-07-10 NOTE — Telephone Encounter (Signed)
Last OV 05/24/13, please advise

## 2013-07-10 NOTE — Telephone Encounter (Signed)
Patient called requesting rx for motion sickness patches because she is going on a 5 night cruise. Pt uses [place]

## 2013-07-11 MED ORDER — SCOPOLAMINE 1 MG/3DAYS TD PT72: 1.0000 | MEDICATED_PATCH | TRANSDERMAL | Status: DC

## 2013-07-11 NOTE — Telephone Encounter (Signed)
Advised patient and sent Rx to pharmacy.

## 2013-07-11 NOTE — Telephone Encounter (Signed)
Ok for Scopolamine patch, apply 1 patch every 3 days.  #1 box

## 2013-08-13 ENCOUNTER — Ambulatory Visit (INDEPENDENT_AMBULATORY_CARE_PROVIDER_SITE_OTHER): Payer: BC Managed Care – PPO | Admitting: *Deleted

## 2013-08-13 ENCOUNTER — Telehealth: Payer: Self-pay | Admitting: Family Medicine

## 2013-08-13 DIAGNOSIS — Z23 Encounter for immunization: Secondary | ICD-10-CM

## 2013-08-13 NOTE — Telephone Encounter (Signed)
Patient is requesting to come in for  TB skin test today. Please advise if she can come in for this.

## 2013-08-13 NOTE — Telephone Encounter (Signed)
Ok- can schedule for nurse visit

## 2013-08-13 NOTE — Telephone Encounter (Signed)
Pt notified and appt made for today.

## 2013-08-13 NOTE — Telephone Encounter (Signed)
Ok for pt to be scheduled for TB skin test?

## 2013-08-15 ENCOUNTER — Encounter: Payer: Self-pay | Admitting: *Deleted

## 2013-08-15 LAB — TB SKIN TEST

## 2013-08-20 ENCOUNTER — Ambulatory Visit: Payer: BC Managed Care – PPO

## 2013-08-22 ENCOUNTER — Ambulatory Visit (INDEPENDENT_AMBULATORY_CARE_PROVIDER_SITE_OTHER): Payer: BC Managed Care – PPO

## 2013-08-22 DIAGNOSIS — Z23 Encounter for immunization: Secondary | ICD-10-CM

## 2013-12-11 ENCOUNTER — Encounter: Payer: Self-pay | Admitting: Gastroenterology

## 2014-01-31 ENCOUNTER — Encounter: Payer: Self-pay | Admitting: Gastroenterology

## 2014-01-31 ENCOUNTER — Ambulatory Visit (AMBULATORY_SURGERY_CENTER): Payer: Self-pay

## 2014-01-31 VITALS — Ht 65.0 in | Wt 189.0 lb

## 2014-01-31 DIAGNOSIS — Z860100 Personal history of colon polyps, unspecified: Secondary | ICD-10-CM

## 2014-01-31 MED ORDER — MOVIPREP 100 G PO SOLR: 1.0000 | Freq: Once | ORAL | Status: DC

## 2014-02-15 ENCOUNTER — Encounter: Payer: BC Managed Care – PPO | Admitting: Gastroenterology

## 2014-02-22 ENCOUNTER — Telehealth: Payer: Self-pay

## 2014-02-22 NOTE — Telephone Encounter (Signed)
Left message for call back Non-identifiable   Pap-09/08/09- negative CCS- 02/18/12- pseudopolyps and colitis; repeat in 2 years (01/2014)-DUE MMG- 02/27/08-negative- DUE Flu-08/22/13 Td- 11/29/06

## 2014-02-25 ENCOUNTER — Encounter: Payer: Self-pay | Admitting: Family Medicine

## 2014-02-25 ENCOUNTER — Encounter: Payer: BC Managed Care – PPO | Admitting: Family Medicine

## 2014-02-25 ENCOUNTER — Other Ambulatory Visit (HOSPITAL_COMMUNITY)
Admission: RE | Admit: 2014-02-25 | Discharge: 2014-02-25 | Disposition: A | Discharge status: Home / Self Care | Payer: BC Managed Care – PPO | Source: Ambulatory Visit | Attending: Family Medicine | Admitting: Family Medicine

## 2014-02-25 ENCOUNTER — Ambulatory Visit (INDEPENDENT_AMBULATORY_CARE_PROVIDER_SITE_OTHER): Payer: BC Managed Care – PPO | Admitting: Family Medicine

## 2014-02-25 VITALS — BP 112/80 | HR 92 | Temp 98.1°F | Resp 16 | Ht 65.5 in | Wt 183.5 lb

## 2014-02-25 DIAGNOSIS — Z1151 Encounter for screening for human papillomavirus (HPV): Secondary | ICD-10-CM | POA: Insufficient documentation

## 2014-02-25 DIAGNOSIS — Z0000 Encounter for general adult medical examination without abnormal findings: Secondary | ICD-10-CM

## 2014-02-25 DIAGNOSIS — G4700 Insomnia, unspecified: Secondary | ICD-10-CM

## 2014-02-25 DIAGNOSIS — Z1231 Encounter for screening mammogram for malignant neoplasm of breast: Secondary | ICD-10-CM

## 2014-02-25 DIAGNOSIS — Z124 Encounter for screening for malignant neoplasm of cervix: Secondary | ICD-10-CM | POA: Insufficient documentation

## 2014-02-25 DIAGNOSIS — Z01419 Encounter for gynecological examination (general) (routine) without abnormal findings: Secondary | ICD-10-CM | POA: Insufficient documentation

## 2014-02-25 LAB — BASIC METABOLIC PANEL
BUN: 10 mg/dL (ref 6–23)
CHLORIDE: 102 meq/L (ref 96–112)
CO2: 26 meq/L (ref 19–32)
Calcium: 9.4 mg/dL (ref 8.4–10.5)
Creatinine, Ser: 0.8 mg/dL (ref 0.4–1.2)
GFR: 87.05 mL/min (ref >60.00)
GLUCOSE: 86 mg/dL (ref 70–99)
POTASSIUM: 3.7 meq/L (ref 3.5–5.1)
SODIUM: 137 meq/L (ref 135–145)

## 2014-02-25 LAB — HEPATIC FUNCTION PANEL
ALT: 14 U/L (ref 0–35)
AST: 15 U/L (ref 0–37)
Albumin: 4.4 g/dL (ref 3.5–5.2)
Alkaline Phosphatase: 60 U/L (ref 39–117)
BILIRUBIN DIRECT: 0 mg/dL (ref 0.0–0.3)
TOTAL PROTEIN: 7.5 g/dL (ref 6.0–8.3)
Total Bilirubin: 0.5 mg/dL (ref 0.3–1.2)

## 2014-02-25 LAB — LIPID PANEL
Cholesterol: 218 mg/dL — ABNORMAL HIGH (ref 0–200)
HDL: 63.7 mg/dL (ref >39.00)
LDL Cholesterol: 125 mg/dL — ABNORMAL HIGH (ref 0–99)
Total CHOL/HDL Ratio: 3
Triglycerides: 147 mg/dL (ref 0.0–149.0)
VLDL: 29.4 mg/dL (ref 0.0–40.0)

## 2014-02-25 LAB — TSH: TSH: 0.46 u[IU]/mL (ref 0.35–5.50)

## 2014-02-25 MED ORDER — TRAZODONE HCL 50 MG PO TABS: 25.0000 mg | ORAL_TABLET | Freq: Every evening | ORAL | Status: DC | PRN

## 2014-02-25 NOTE — Assessment & Plan Note (Signed)
Pt's PE WNL.  Due for mammo- ordered today.  Pap collected.  Check labs.  Anticipatory guidance provided.

## 2014-02-25 NOTE — Patient Instructions (Signed)
Follow up in 1 year or as needed We'll notify you of your lab results and make any changes if needed Keep up the good work!  You look great! We'll call you with your mammo appt Start the Trazodone as needed for sleep Call with any questions or concerns Happy Spring!

## 2014-02-25 NOTE — Assessment & Plan Note (Signed)
Pap collected. 

## 2014-02-25 NOTE — Progress Notes (Signed)
   Subjective:    Patient ID: Melissa Cohen, female    DOB: October 17, 1966, 48 y.o.   MRN: [id number]  HPI CPE- UTD on colonoscopy, overdue for mammo and pap.   Review of Systems Patient reports no vision/ hearing changes, adenopathy,fever, weight change,  persistant/recurrent hoarseness , swallowing issues, chest pain, palpitations, edema, persistant/recurrent cough, hemoptysis, dyspnea (rest/exertional/paroxysmal nocturnal), gastrointestinal bleeding (melena, rectal bleeding), abdominal pain, significant heartburn, bowel changes, GU symptoms (dysuria, hematuria, incontinence), Gyn symptoms (abnormal  bleeding, pain),  syncope, focal weakness, memory loss, numbness & tingling, skin/hair/nail changes, abnormal bruising or bleeding, anxiety, or depression.  + insomnia- father is still sick w/ cancer, pt not sleeping well.      Objective:   Physical Exam  General Appearance:    Alert, cooperative, no distress, appears stated age  Head:    Normocephalic, without obvious abnormality, atraumatic  Eyes:    PERRL, conjunctiva/corneas clear, EOM's intact, fundi    benign, both eyes  Ears:    Normal TM's and external ear canals, both ears  Nose:   Nares normal, septum midline, mucosa normal, no drainage    or sinus tenderness  Throat:   Lips, mucosa, and tongue normal; teeth and gums normal  Neck:   Supple, symmetrical, trachea midline, no adenopathy;    Thyroid: no enlargement/tenderness/nodules  Back:     Symmetric, no curvature, ROM normal, no CVA tenderness  Lungs:     Clear to auscultation bilaterally, respirations unlabored  Chest Wall:    No tenderness or deformity   Heart:    Regular rate and rhythm, S1 and S2 normal, no murmur, rub   or gallop  Breast Exam:    No tenderness, masses, or nipple abnormality  Abdomen:     Soft, non-tender, bowel sounds active all four quadrants,    no masses, no organomegaly  Genitalia:    External genitalia normal, cervix normal in appearance, no CMT,  uterus in normal size and position, adnexa w/out mass or tenderness, mucosa pink and moist, no lesions or discharge present  Rectal:    Normal external appearance  Extremities:   Extremities normal, atraumatic, no cyanosis or edema  Pulses:   2+ and symmetric all extremities  Skin:   Skin color, texture, turgor normal, no rashes or lesions  Lymph nodes:   Cervical, supraclavicular, and axillary nodes normal  Neurologic:   CNII-XII intact, normal strength, sensation and reflexes    throughout          Assessment & Plan:

## 2014-02-25 NOTE — Progress Notes (Signed)
Pre visit review using our clinic review tool, if applicable. No additional management support is needed unless otherwise documented below in the visit note. 

## 2014-02-26 LAB — CBC WITH DIFFERENTIAL/PLATELET
Basophils Absolute: 0 10*3/uL (ref 0.0–0.1)
Basophils Relative: 0.3 % (ref 0.0–3.0)
EOS ABS: 0.1 10*3/uL (ref 0.0–0.7)
Eosinophils Relative: 1.1 % (ref 0.0–5.0)
HCT: 44.7 % (ref 36.0–46.0)
Hemoglobin: 15 g/dL (ref 12.0–15.0)
LYMPHS PCT: 27.2 % (ref 12.0–46.0)
Lymphs Abs: 1.7 10*3/uL (ref 0.7–4.0)
MCHC: 33.7 g/dL (ref 30.0–36.0)
MCV: 90.5 fl (ref 78.0–100.0)
MONO ABS: 0.4 10*3/uL (ref 0.1–1.0)
Monocytes Relative: 5.9 % (ref 3.0–12.0)
NEUTROS ABS: 4 10*3/uL (ref 1.4–7.7)
Neutrophils Relative %: 65.5 % (ref 43.0–77.0)
Platelets: 266 10*3/uL (ref 150.0–400.0)
RBC: 4.94 Mil/uL (ref 3.87–5.11)
RDW: 13.3 % (ref 11.5–14.6)
WBC: 6.2 10*3/uL (ref 4.5–10.5)

## 2014-02-27 NOTE — Telephone Encounter (Signed)
Unable to reach pre visit.  

## 2014-03-01 LAB — VITAMIN D 1,25 DIHYDROXY
VITAMIN D3 1, 25 (OH): 29 pg/mL
Vitamin D 1, 25 (OH)2 Total: 29 pg/mL (ref 18–72)

## 2014-08-07 ENCOUNTER — Telehealth: Payer: Self-pay | Admitting: Family Medicine

## 2014-08-07 NOTE — Telephone Encounter (Signed)
Caller name: Veneda  Relation to pt: self  Call back number: [phone]   Reason for call:   Pt scheduled appointment for 9/18 for a hormone check pt states she's been exp. mood swings. Pt wanted to have her flu shot and TB done is it possible if she can have this done the same day or schedule her with the nurse? Please advise

## 2014-08-07 NOTE — Telephone Encounter (Signed)
Can be done in office.

## 2014-08-16 ENCOUNTER — Ambulatory Visit: Payer: BC Managed Care – PPO | Admitting: Family Medicine

## 2014-08-23 ENCOUNTER — Encounter: Payer: Self-pay | Admitting: Family Medicine

## 2014-08-23 ENCOUNTER — Ambulatory Visit (INDEPENDENT_AMBULATORY_CARE_PROVIDER_SITE_OTHER): Payer: BC Managed Care – PPO | Admitting: Family Medicine

## 2014-08-23 VITALS — BP 110/76 | HR 79 | Temp 98.1°F | Resp 16 | Wt 190.4 lb

## 2014-08-23 DIAGNOSIS — R5381 Other malaise: Secondary | ICD-10-CM

## 2014-08-23 DIAGNOSIS — Z111 Encounter for screening for respiratory tuberculosis: Secondary | ICD-10-CM

## 2014-08-23 DIAGNOSIS — N951 Menopausal and female climacteric states: Secondary | ICD-10-CM | POA: Insufficient documentation

## 2014-08-23 DIAGNOSIS — Z23 Encounter for immunization: Secondary | ICD-10-CM

## 2014-08-23 DIAGNOSIS — R5383 Other fatigue: Principal | ICD-10-CM

## 2014-08-23 LAB — CBC WITH DIFFERENTIAL/PLATELET
BASOS ABS: 0 10*3/uL (ref 0.0–0.1)
Basophils Relative: 0.4 % (ref 0.0–3.0)
EOS ABS: 0.1 10*3/uL (ref 0.0–0.7)
Eosinophils Relative: 1.2 % (ref 0.0–5.0)
HEMATOCRIT: 40.2 % (ref 36.0–46.0)
Hemoglobin: 13.7 g/dL (ref 12.0–15.0)
LYMPHS ABS: 2.7 10*3/uL (ref 0.7–4.0)
Lymphocytes Relative: 39.3 % (ref 12.0–46.0)
MCHC: 33.9 g/dL (ref 30.0–36.0)
MCV: 90.3 fl (ref 78.0–100.0)
Monocytes Absolute: 0.4 10*3/uL (ref 0.1–1.0)
Monocytes Relative: 6.4 % (ref 3.0–12.0)
Neutro Abs: 3.6 10*3/uL (ref 1.4–7.7)
Neutrophils Relative %: 52.7 % (ref 43.0–77.0)
Platelets: 302 10*3/uL (ref 150.0–400.0)
RBC: 4.45 Mil/uL (ref 3.87–5.11)
RDW: 13.4 % (ref 11.5–15.5)
WBC: 6.9 10*3/uL (ref 4.0–10.5)

## 2014-08-23 LAB — BASIC METABOLIC PANEL
BUN: 10 mg/dL (ref 6–23)
CALCIUM: 9.4 mg/dL (ref 8.4–10.5)
CO2: 26 meq/L (ref 19–32)
Chloride: 102 mEq/L (ref 96–112)
Creatinine, Ser: 0.7 mg/dL (ref 0.4–1.2)
GFR: 89.63 mL/min (ref >60.00)
Glucose, Bld: 92 mg/dL (ref 70–99)
Potassium: 3.9 mEq/L (ref 3.5–5.1)
Sodium: 136 mEq/L (ref 135–145)

## 2014-08-23 LAB — TSH: TSH: 1.28 u[IU]/mL (ref 0.35–4.50)

## 2014-08-23 LAB — LUTEINIZING HORMONE: LH: 35.83 m[IU]/mL

## 2014-08-23 LAB — FOLLICLE STIMULATING HORMONE: FSH: 62.8 m[IU]/mL

## 2014-08-23 NOTE — Progress Notes (Signed)
   Subjective:    Patient ID: Melissa Cohen, female    DOB: 02/06/1966, 48 y.o.   MRN: [id number]  HPI 'over the last few months I have not felt as good as I was feeling'.  Gaining weight, decreased energy, decreased sex drive, irritable, hot flashes at night.  LMP- 'close to a year ago'.  Not taking any OTC menopausal treatments.  Pt not interested in SNRI due to previous bad experience w/ Lexapro.   Review of Systems For ROS see HPI     Objective:   Physical Exam  Vitals reviewed. Constitutional: She is oriented to person, place, and time. She appears well-developed and well-nourished. No distress.  HENT:  Head: Normocephalic and atraumatic.  Eyes: Conjunctivae and EOM are normal. Pupils are equal, round, and reactive to light.  Neck: Normal range of motion. Neck supple. No thyromegaly present.  Cardiovascular: Normal rate, regular rhythm, normal heart sounds and intact distal pulses.   No murmur heard. Pulmonary/Chest: Effort normal and breath sounds normal. No respiratory distress.  Musculoskeletal: She exhibits no edema.  Lymphadenopathy:    She has no cervical adenopathy.  Neurological: She is alert and oriented to person, place, and time.  Skin: Skin is warm and dry.  Psychiatric: She has a normal mood and affect. Her behavior is normal.          Assessment & Plan:

## 2014-08-23 NOTE — Patient Instructions (Signed)
Call in the next 2-4 weeks if no improvement in sxs Start the [place] and/or [employer] for the menopausal symptoms (available OTC) We'll notify you of your lab results and make any changes if needed Look up [facility] and see what you think about it Call with any questions or concerns Hang in there!!

## 2014-08-23 NOTE — Progress Notes (Signed)
Pre visit review using our clinic review tool, if applicable. No additional management support is needed unless otherwise documented below in the visit note. 

## 2014-08-24 NOTE — Assessment & Plan Note (Signed)
Recurrent problem for pt.  Check labs to r/o thyroid or electrolyte abnormality, r/o anemia.  Suspect this is due to menopause, possibly depression.  Will treat lab abnormalities if present.  Will follow.

## 2014-08-24 NOTE — Assessment & Plan Note (Signed)
New.  Pt is having hot flashes, mood swings, weight gain and has not had a recent period.  Check labs to assess menopausal status.  Discussed tx w/ SNRI- pt reluctant.  Talked about OTC treatments- pt wants to attempt this first.  Also discussed [place] and encouraged her to look up information and decide if this is something she is willing to take.  Will follow.

## 2014-09-06 ENCOUNTER — Telehealth: Payer: Self-pay | Admitting: Family Medicine

## 2014-09-06 NOTE — Telephone Encounter (Signed)
Patient called in stating that she was bitten by a dog and wanted to know when last tetanus shot was. I gave patient the date of last shot. I offered patient appointment this afternoon at the [facility] office but patient declined stating that she was going to an urgent care.

## 2014-11-27 ENCOUNTER — Telehealth: Payer: Self-pay | Admitting: Family Medicine

## 2014-11-27 MED ORDER — TRAZODONE HCL 50 MG PO TABS: 25.0000 mg | ORAL_TABLET | Freq: Every evening | ORAL | Status: DC | PRN

## 2014-11-27 NOTE — Telephone Encounter (Signed)
Caller name: Torah Relation to pt: self Call back number: [phone] Pharmacy: [facility]  Reason for call:   Patient requesting a trazodone refill.

## 2014-11-27 NOTE — Telephone Encounter (Signed)
Med filled.  

## 2015-03-25 ENCOUNTER — Telehealth: Payer: Self-pay | Admitting: Family Medicine

## 2015-03-25 NOTE — Telephone Encounter (Signed)
Caller name:Vanleet, Anola Relation to SO:XUJN Call back [place] [facility]  Reason for call: pt has appt for cpe on 07/31/15, pt states she is needing a new rx for nexium, states she was taken it and got better and did not need it but her heart burn has come back and the nexium is the only thing that helps her.

## 2015-03-26 MED ORDER — ESOMEPRAZOLE MAGNESIUM 40 MG PO CPDR: 40.0000 mg | DELAYED_RELEASE_CAPSULE | Freq: Every day | ORAL | Status: DC

## 2015-03-26 NOTE — Telephone Encounter (Signed)
Medication filled.  

## 2015-07-16 ENCOUNTER — Telehealth: Payer: Self-pay | Admitting: Family Medicine

## 2015-07-16 NOTE — Telephone Encounter (Signed)
pre visit letter mailed 07/10/15

## 2015-07-30 ENCOUNTER — Encounter: Payer: Self-pay | Admitting: Family Medicine

## 2015-07-31 ENCOUNTER — Ambulatory Visit (INDEPENDENT_AMBULATORY_CARE_PROVIDER_SITE_OTHER): Payer: BLUE CROSS/BLUE SHIELD | Admitting: Family Medicine

## 2015-07-31 ENCOUNTER — Encounter: Payer: Self-pay | Admitting: Family Medicine

## 2015-07-31 VITALS — BP 110/72 | HR 78 | Temp 98.2°F | Resp 18 | Ht 64.0 in | Wt 186.4 lb

## 2015-07-31 DIAGNOSIS — Z23 Encounter for immunization: Secondary | ICD-10-CM

## 2015-07-31 DIAGNOSIS — Z111 Encounter for screening for respiratory tuberculosis: Secondary | ICD-10-CM | POA: Diagnosis not present

## 2015-07-31 DIAGNOSIS — Z0000 Encounter for general adult medical examination without abnormal findings: Secondary | ICD-10-CM

## 2015-07-31 MED ORDER — TRAZODONE HCL 50 MG PO TABS: 25.0000 mg | ORAL_TABLET | Freq: Every evening | ORAL | Status: DC | PRN

## 2015-07-31 NOTE — Progress Notes (Signed)
Pre visit review using our clinic review tool, if applicable. No additional management support is needed unless otherwise documented below in the visit note. 

## 2015-07-31 NOTE — Assessment & Plan Note (Signed)
Pt's PE WNL w/ exception of obesity.  She has lost 4-5 lbs since last visit and I applauded her efforts.  She is wearing a FitBit and attempting to get more regular exercise.  Stressed need for healthy food choices.  UTD on mammo w/ Dr Ronita Hipps.  Due for colonoscopy w/ Dr Fuller Plan- encouraged her to schedule.  Check labs.  Flu shot given.  Anticipatory guidance provided.

## 2015-07-31 NOTE — Progress Notes (Signed)
   Subjective:    Patient ID: Melissa Cohen, female    DOB: 1966-11-04, 49 y.o.   MRN: [id number]  HPI CPE- UTD on pap (2015), UTD on mammo at Dr Kennith Maes office. Due for colonoscopy (Dr Fuller Plan).  Pt has lost 5lbs since last year.  Pt needs TB test for work.   Review of Systems Patient reports no vision/ hearing changes, adenopathy,fever, weight change,  persistant/recurrent hoarseness , swallowing issues, chest pain, palpitations, edema, persistant/recurrent cough, hemoptysis, dyspnea (rest/exertional/paroxysmal nocturnal), gastrointestinal bleeding (melena, rectal bleeding), abdominal pain, significant heartburn, bowel changes, GU symptoms (dysuria, hematuria, incontinence), Gyn symptoms (abnormal  bleeding, pain),  syncope, focal weakness, memory loss, numbness & tingling, skin/hair/nail changes, abnormal bruising or bleeding, anxiety, or depression.     Objective:   Physical Exam General Appearance:    Alert, cooperative, no distress, appears stated age  Head:    Normocephalic, without obvious abnormality, atraumatic  Eyes:    PERRL, conjunctiva/corneas clear, EOM's intact, fundi    benign, both eyes  Ears:    Normal TM's and external ear canals, both ears  Nose:   Nares normal, septum midline, mucosa normal, no drainage    or sinus tenderness  Throat:   Lips, mucosa, and tongue normal; teeth and gums normal  Neck:   Supple, symmetrical, trachea midline, no adenopathy;    Thyroid: no enlargement/tenderness/nodules  Back:     Symmetric, no curvature, ROM normal, no CVA tenderness  Lungs:     Clear to auscultation bilaterally, respirations unlabored  Chest Wall:    No tenderness or deformity   Heart:    Regular rate and rhythm, S1 and S2 normal, no murmur, rub   or gallop  Breast Exam:    Deferred to GYN  Abdomen:     Soft, non-tender, bowel sounds active all four quadrants,    no masses, no organomegaly  Genitalia:    Deferred to GYN  Rectal:    Extremities:   Extremities  normal, atraumatic, no cyanosis or edema  Pulses:   2+ and symmetric all extremities  Skin:   Skin color, texture, turgor normal, no rashes or lesions  Lymph nodes:   Cervical, supraclavicular, and axillary nodes normal  Neurologic:   CNII-XII intact, normal strength, sensation and reflexes    throughout          Assessment & Plan:

## 2015-07-31 NOTE — Patient Instructions (Signed)
Follow up in 6 months to recheck weight loss progress We'll notify you of your lab results and make any changes if needed Keep up the good work on healthy diet and regular exercise- you can do this! Call and schedule your colonoscopy Call with any questions or concerns Happy Labor Day!!!

## 2015-08-01 LAB — HEPATIC FUNCTION PANEL
ALK PHOS: 56 U/L (ref 39–117)
ALT: 15 U/L (ref 0–35)
AST: 16 U/L (ref 0–37)
Albumin: 4.3 g/dL (ref 3.5–5.2)
BILIRUBIN DIRECT: 0.1 mg/dL (ref 0.0–0.3)
TOTAL PROTEIN: 7.3 g/dL (ref 6.0–8.3)
Total Bilirubin: 0.5 mg/dL (ref 0.2–1.2)

## 2015-08-01 LAB — BASIC METABOLIC PANEL
BUN: 19 mg/dL (ref 6–23)
CALCIUM: 9.6 mg/dL (ref 8.4–10.5)
CO2: 27 meq/L (ref 19–32)
Chloride: 105 mEq/L (ref 96–112)
Creatinine, Ser: 0.7 mg/dL (ref 0.40–1.20)
GFR: 93.73 mL/min (ref >60.00)
Glucose, Bld: 79 mg/dL (ref 70–99)
POTASSIUM: 3.9 meq/L (ref 3.5–5.1)
SODIUM: 141 meq/L (ref 135–145)

## 2015-08-01 LAB — LIPID PANEL
CHOLESTEROL: 185 mg/dL (ref 0–200)
HDL: 57.6 mg/dL (ref >39.00)
LDL Cholesterol: 109 mg/dL — ABNORMAL HIGH (ref 0–99)
NONHDL: 127.35
Total CHOL/HDL Ratio: 3
Triglycerides: 93 mg/dL (ref 0.0–149.0)
VLDL: 18.6 mg/dL (ref 0.0–40.0)

## 2015-08-01 LAB — CBC WITH DIFFERENTIAL/PLATELET
BASOS ABS: 0 10*3/uL (ref 0.0–0.1)
Basophils Relative: 0.4 % (ref 0.0–3.0)
EOS ABS: 0.1 10*3/uL (ref 0.0–0.7)
Eosinophils Relative: 1.6 % (ref 0.0–5.0)
HEMATOCRIT: 41.9 % (ref 36.0–46.0)
HEMOGLOBIN: 14.2 g/dL (ref 12.0–15.0)
LYMPHS PCT: 34.2 % (ref 12.0–46.0)
Lymphs Abs: 2.2 10*3/uL (ref 0.7–4.0)
MCHC: 33.8 g/dL (ref 30.0–36.0)
MCV: 89.4 fl (ref 78.0–100.0)
MONOS PCT: 7.3 % (ref 3.0–12.0)
Monocytes Absolute: 0.5 10*3/uL (ref 0.1–1.0)
Neutro Abs: 3.6 10*3/uL (ref 1.4–7.7)
Neutrophils Relative %: 56.5 % (ref 43.0–77.0)
Platelets: 254 10*3/uL (ref 150.0–400.0)
RBC: 4.68 Mil/uL (ref 3.87–5.11)
RDW: 13.8 % (ref 11.5–15.5)
WBC: 6.4 10*3/uL (ref 4.0–10.5)

## 2015-08-01 LAB — TSH: TSH: 1.56 u[IU]/mL (ref 0.35–4.50)

## 2015-08-01 LAB — VITAMIN D 25 HYDROXY (VIT D DEFICIENCY, FRACTURES): VITD: 41.54 ng/mL (ref 30.00–100.00)

## 2015-08-02 LAB — QUANTIFERON TB GOLD ASSAY (BLOOD)
Interferon Gamma Release Assay: NEGATIVE
QUANTIFERON NIL VALUE: 0.18 [IU]/mL
QUANTIFERON TB AG MINUS NIL: 0.14 [IU]/mL
TB AG VALUE: 0.32 [IU]/mL

## 2015-12-08 MED FILL — traZODone HCL 50 MG TABS: 50 | 30 days supply | Qty: 30 | Fill #1

## 2015-12-18 ENCOUNTER — Encounter: Payer: Self-pay | Admitting: Family Medicine

## 2015-12-18 ENCOUNTER — Ambulatory Visit (INDEPENDENT_AMBULATORY_CARE_PROVIDER_SITE_OTHER): Payer: BLUE CROSS/BLUE SHIELD | Admitting: Family Medicine

## 2015-12-18 VITALS — BP 122/83 | HR 96 | Temp 98.3°F | Ht 64.0 in | Wt 189.2 lb

## 2015-12-18 DIAGNOSIS — R319 Hematuria, unspecified: Secondary | ICD-10-CM | POA: Diagnosis not present

## 2015-12-18 DIAGNOSIS — R350 Frequency of micturition: Secondary | ICD-10-CM | POA: Insufficient documentation

## 2015-12-18 LAB — POCT URINALYSIS DIPSTICK
Bilirubin, UA: NEGATIVE
GLUCOSE UA: NEGATIVE
KETONES UA: NEGATIVE
Nitrite, UA: NEGATIVE
PROTEIN UA: NEGATIVE
Spec Grav, UA: 1.01
Urobilinogen, UA: 4
pH, UA: 6

## 2015-12-18 MED ORDER — PHENAZOPYRIDINE HCL 100 MG PO TABS: 100.0000 mg | ORAL_TABLET | Freq: Three times a day (TID) | ORAL | Status: DC | PRN

## 2015-12-18 MED ORDER — SCOPOLAMINE 1 MG/3DAYS TD PT72: 1.0000 | MEDICATED_PATCH | TRANSDERMAL | Status: DC

## 2015-12-18 MED ORDER — CEPHALEXIN 500 MG PO CAPS: 500.0000 mg | ORAL_CAPSULE | Freq: Two times a day (BID) | ORAL | Status: AC

## 2015-12-18 MED FILL — CEPHALEXIN 500 MG CAPSULE: 500 | 5 days supply | Qty: 10 | Fill #0

## 2015-12-18 NOTE — Progress Notes (Signed)
Pre visit review using our clinic review tool, if applicable. No additional management support is needed unless otherwise documented below in the visit note. 

## 2015-12-18 NOTE — Progress Notes (Signed)
   Subjective:    Patient ID: Melissa Cohen, female    DOB: 08-17-1966, 50 y.o.   MRN: [id number]  HPI UTI- sxs started today w/ dysuria.  Yesterday had a urinary odor.  + frequency, urgency, hesitancy.  + suprapubic pressure.  No back pain.  No fever.  + pink tinge on toilet paper.   Review of Systems For ROS see HPI     Objective:   Physical Exam  Constitutional: She is oriented to person, place, and time. She appears well-developed and well-nourished. No distress.  Abdominal: Soft. She exhibits no distension. There is no tenderness (no suprapubic or CVA tenderness).  Neurological: She is alert and oriented to person, place, and time.  Skin: Skin is warm and dry.  Psychiatric: She has a normal mood and affect. Her behavior is normal. Thought content normal.  Vitals reviewed.         Assessment & Plan:

## 2015-12-18 NOTE — Patient Instructions (Addendum)
Follow up as needed Drink plenty of fluids Start the Keflex twice daily Pyridium as needed for pain relief Call with any questions or concerns Hang in there!!

## 2015-12-18 NOTE — Assessment & Plan Note (Signed)
New.  Pt's sxs and UA consistent w/ infxn.  Start abx.  Reviewed supportive care and red flags that should prompt return.  Pt expressed understanding and is in agreement w/ plan.  

## 2015-12-19 ENCOUNTER — Telehealth: Payer: Self-pay | Admitting: *Deleted

## 2015-12-19 NOTE — Telephone Encounter (Signed)
PA initiated on [email or web address], awaiting determination. JG//CMA

## 2015-12-20 LAB — URINE CULTURE: Colony Count: >=100000

## 2015-12-21 ENCOUNTER — Encounter: Payer: Self-pay | Admitting: Family Medicine

## 2015-12-29 MED FILL — ESOMEPRAZOLE MAG DR 40 MG C: 40 | 30 days supply | Qty: 30 | Fill #3

## 2016-01-01 MED FILL — PHENTERMINE 37.5 MG TABLET: 37.5 | 30 days supply | Qty: 30 | Fill #0

## 2016-01-14 ENCOUNTER — Ambulatory Visit (INDEPENDENT_AMBULATORY_CARE_PROVIDER_SITE_OTHER): Payer: BLUE CROSS/BLUE SHIELD | Admitting: Medical

## 2016-01-14 ENCOUNTER — Encounter: Payer: Self-pay | Admitting: Medical

## 2016-01-14 VITALS — BP 120/80 | HR 78 | Temp 98.1°F | Ht 64.0 in | Wt 191.6 lb

## 2016-01-14 DIAGNOSIS — J0100 Acute maxillary sinusitis, unspecified: Secondary | ICD-10-CM

## 2016-01-14 DIAGNOSIS — J209 Acute bronchitis, unspecified: Secondary | ICD-10-CM

## 2016-01-14 MED ORDER — AZITHROMYCIN 250 MG PO TABS: ORAL_TABLET | ORAL | Status: DC

## 2016-01-14 MED ORDER — FLUTICASONE PROPIONATE 50 MCG/ACT NA SUSP: 2.0000 | Freq: Every day | NASAL | Status: DC

## 2016-01-14 MED ORDER — HYDROCODONE-HOMATROPINE 5-1.5 MG/5ML PO SYRP: 5.0000 mL | ORAL_SOLUTION | Freq: Three times a day (TID) | ORAL | Status: DC | PRN

## 2016-01-14 MED FILL — AZITHROMYCIN 250 MG TABLET: 250 | 5 days supply | Qty: 6 | Fill #0

## 2016-01-14 MED FILL — FLUTICASONE PROP 50 MCG SPR: 50 | 16 days supply | Qty: 16 | Fill #0

## 2016-01-14 MED FILL — HYDROCODONE-HOMATROPINE SYR: 5-1.5 | 8 days supply | Qty: 120 | Fill #0

## 2016-01-14 NOTE — Progress Notes (Signed)
Subjective:    Patient ID: Melissa Cohen, female    DOB: 01-Oct-1966, 50 y.o.   MRN: [id number]  HPI  Pt in with some nasal congestion/mild sinus pressure, runny nose and st since Saturday. [place] allow [place] to smoke on board. Pt took some cephelexin on the cruise. It was old rx. And she started it on Saturday.   No fever, no chills or sweats. No body aches. Pt just got home yesterday.  Some sneeze rarely.   Pt feels like not improving with cephalexin.  Pt used to be a smoker. 10 years ago.  No wheezing. Pt has proair if she needs.    Review of Systems  Constitutional: Negative for fever, chills, diaphoresis and fatigue.  HENT: Positive for congestion, sinus pressure and sore throat.   Respiratory: Positive for cough. Negative for wheezing.        Coughing keeping her up at night.  Musculoskeletal: Negative for myalgias and back pain.  Neurological: Negative for dizziness, numbness and headaches.  Hematological: Negative for adenopathy.  Psychiatric/Behavioral: Negative for behavioral problems and confusion.    Past Medical History  Diagnosis Date  . Ulcerative colitis     Dx at age 23  . Skin cancer, basal cell   . Gastric ulcer   . GERD (gastroesophageal reflux disease)     Social History   Social History  . Marital Status: Divorced    Spouse Name: N/A  . Number of Children: 3  . Years of Education: N/A   Occupational History  . Nursing secretary  [place] History Main Topics  . Smoking status: Former Smoker -- 0.50 packs/day for 25 years    Types: Cigarettes    Quit date: 11/29/2005  . Smokeless tobacco: Never Used  . Alcohol Use: 1.8 oz/week    3 Glasses of wine per week     Comment: every other weekend socially  . Drug Use: No  . Sexual Activity: Not on file   Other Topics Concern  . Not on file   Social History Narrative    Past Surgical History  Procedure Laterality Date  . Appendectomy    .  Uterine ablation    . Tubal ligation    . Breast enhancement surgery    . Tonsillectomy and adenoidectomy    . Cholecystectomy  2011  . Right eye lasix      02/17/12    Family History  Problem Relation Age of Onset  . Prostate cancer Father   . Hyperlipidemia Father   . Barrett's esophagus Father   . Cancer Father     prostate  . Colon cancer Father 20    appendical adenocarcinoma 2011  . Diabetes Sister   . Diabetes      Grandparents  . Heart attack Maternal Grandfather   . Heart disease Maternal Grandfather   . Ulcerative colitis Mother   . Ulcerative colitis Maternal Grandmother   . Crohn's disease Brother   . Esophageal cancer Neg Hx   . Rectal cancer Neg Hx   . Stomach cancer Neg Hx     Allergies  Allergen Reactions  . Cimetidine     REACTION: anaphalactic shock  . Escitalopram Oxalate     Caused ankle swelling   . Mobic [Meloxicam] Swelling    Current Outpatient Prescriptions on File Prior to Visit  Medication Sig Dispense Refill  . esomeprazole (NEXIUM) 40 MG capsule Take 1 capsule (40 mg  total) by mouth daily. 30 capsule 6  . Nutritional Supplements (JUICE PLUS FIBRE PO) Take 2 tablets by mouth daily.    Marland Kitchen scopolamine (TRANSDERM-SCOP) 1 MG/3DAYS Place 1 patch (1.5 mg total) onto the skin every 3 (three) days. 4 patch 1  . traZODone (DESYREL) 50 MG tablet Take 0.5-1 tablets (25-50 mg total) by mouth at bedtime as needed for sleep. 30 tablet 3  . [DISCONTINUED] escitalopram (LEXAPRO) 10 MG tablet Take 1 tablet (10 mg total) by mouth daily. 30 tablet 6   No current facility-administered medications on file prior to visit.    BP 120/80 mmHg  Pulse 78  Temp(Src) 98.1 F (36.7 C) (Oral)  Ht 5' 4"  (1.626 m)  Wt 191 lb 9.6 oz (86.909 kg)  BMI 32.87 kg/m2  SpO2 98%  LMP 09/02/2013       Objective:   Physical Exam  General  Mental Status - Alert. General Appearance - Well groomed. Not in acute distress. But sounds moderate nasal  congested.  Skin Rashes- No Rashes.  HEENT Head- Normal. Ear Auditory Canal - Left- Normal. Right - Normal.Tympanic Membrane- Left- Normal. Right- Normal. Eye Sclera/Conjunctiva- Left- Normal. Right- Normal. Nose & Sinuses Nasal Mucosa- Left-  Boggy and Congested. Right-  Boggy and  Congested.Bilateral  No maxillary and  No frontal sinus pressure. Mouth & Throat Lips: Upper Lip- Normal: no dryness, cracking, pallor, cyanosis, or vesicular eruption. Lower Lip-Normal: no dryness, cracking, pallor, cyanosis or vesicular eruption. Buccal Mucosa- Bilateral- No Aphthous ulcers. Oropharynx- No Discharge or Erythema. Tonsils: Characteristics- Bilateral- No Erythema or Congestion. Size/Enlargement- Bilateral- No enlargement. Discharge- bilateral-None.  Neck Neck- Supple. No Masses.   Chest and Lung Exam Auscultation: Breath Sounds:-Clear even and unlabored. Faint upper lobe rhonchi  Cardiovascular Auscultation:Rythm- Regular, rate and rhythm. Murmurs & Other Heart Sounds:Ausculatation of the heart reveal- No Murmurs.  Lymphatic Head & Neck General Head & Neck Lymphatics: Bilateral: Description- No Localized lymphadenopathy.       Assessment & Plan:   You appear to have early  bronchitis and sinusitis. Rest hydrate and tylenol for fever. I am prescribing cough medicine hycodan, and azithromycin  antibiotic. For your nasal congestion flonase.  If you have any wheezing use your proair.  Would advise stopping your cephalexin.  You should gradually get better. If not then notify us and would recommend a chest xray.  Follow up in 7-10 days or as needed  Reviewed hycodan syrup allergy epic caution with our pharmacist. She ran epic caution in her program and no reaction. Pt states has used hydrocodone before and no reaction. So rx'd.

## 2016-01-14 NOTE — Patient Instructions (Signed)
You appear to have early  bronchitis and sinusitis. Rest hydrate and tylenol for fever. I am prescribing cough medicine hycodan, and azithromycin  antibiotic. For your nasal congestion flonase.  If you have any wheezing use your proair.  Would advise stopping your cephalexin.  You should gradually get better. If not then notify us and would recommend a chest xray.  Follow up in 7-10 days or as needed

## 2016-01-14 NOTE — Progress Notes (Signed)
Pre visit review using our clinic review tool, if applicable. No additional management support is needed unless otherwise documented below in the visit note. 

## 2016-01-27 ENCOUNTER — Telehealth: Payer: Self-pay | Admitting: *Deleted

## 2016-01-27 NOTE — Telephone Encounter (Signed)
Will forward to office manager for review as there is nothing that Shanon Brow did wrong

## 2016-01-27 NOTE — Telephone Encounter (Signed)
Pt called very upset that she was charged a specimen handling fee of 16$ that she never had charged before for urine culture. I tried explaining to patient over and over that it is standard protocol for our office to charge all our patients in office a specimen handling fee anytime we have to send out to other agencies. Pt was still upset and rude..kept blaming me for charging her ..I gave her a option to contact my supervisor for further detail on why we charged .

## 2016-02-05 MED FILL — PHENTERMINE 37.5 MG TABLET: 37.5 | 30 days supply | Qty: 30 | Fill #0

## 2016-02-06 ENCOUNTER — Encounter: Payer: Self-pay | Admitting: Family Medicine

## 2016-02-06 ENCOUNTER — Ambulatory Visit (INDEPENDENT_AMBULATORY_CARE_PROVIDER_SITE_OTHER): Payer: BLUE CROSS/BLUE SHIELD | Admitting: Family Medicine

## 2016-02-06 VITALS — BP 136/80 | HR 82 | Resp 18 | Ht 64.0 in | Wt 184.4 lb

## 2016-02-06 DIAGNOSIS — J45998 Other asthma: Secondary | ICD-10-CM | POA: Insufficient documentation

## 2016-02-06 MED ORDER — ALBUTEROL SULFATE (2.5 MG/3ML) 0.083% IN NEBU
2.5000 mg | INHALATION_SOLUTION | Freq: Once | RESPIRATORY_TRACT | Status: AC
  Administered 2016-02-06: 2.5 mg via RESPIRATORY_TRACT

## 2016-02-06 MED ORDER — ALBUTEROL SULFATE HFA 108 (90 BASE) MCG/ACT IN AERS: 2.0000 | INHALATION_SPRAY | Freq: Four times a day (QID) | RESPIRATORY_TRACT | Status: DC | PRN

## 2016-02-06 MED ORDER — PREDNISONE 10 MG PO TABS: ORAL_TABLET | ORAL | Status: DC

## 2016-02-06 MED FILL — predniSONE 10 MG TABS: 10 | 9 days supply | Qty: 18 | Fill #0

## 2016-02-06 MED FILL — VENTOLIN HFA 90 MCG INHALER: 108 (90 BAS | 30 days supply | Qty: 18 | Fill #0

## 2016-02-06 NOTE — Assessment & Plan Note (Signed)
Pt w/ hx of similar in the past.  Pt is otherwise feeling well but unable to stop coughing.  Pt's cough improved s/p neb tx.  Will start pred taper and home albuterol HFA prn.  Reviewed supportive care and red flags that should prompt return.  Pt expressed understanding and is in agreement w/ plan.

## 2016-02-06 NOTE — Progress Notes (Signed)
Pre visit review using our clinic review tool, if applicable. No additional management support is needed unless otherwise documented below in the visit note. 

## 2016-02-06 NOTE — Patient Instructions (Signed)
Follow up as needed Use the albuterol inhaler- 2 puffs every 4 hrs as needed for cough, shortness of breath, wheezing Start the Prednisone as directed- take w/ food Drink plenty of fluids Call with any questions or concerns Hang in there!!!

## 2016-02-06 NOTE — Addendum Note (Signed)
Addended by: Desmond Dike L on: 02/06/2016 11:15 AM   Modules accepted: Orders

## 2016-02-06 NOTE — Progress Notes (Signed)
   Subjective:    Patient ID: Melissa Cohen, female    DOB: 12-29-1965, 50 y.o.   MRN: [id number]  HPI Cough- pt was seen 2/15 and tx'd w/ Zpack and cough syrup.  Pt reports she is not able to stop coughing.  Pt reports cough will trigger w/ speaking, deep breaths.  Has 'tickle' deep in chest.  Cough is dry and nearly continuous.  Denies SOB.  No fevers.  Pt reports feeling well w/ exception of cough.   Review of Systems For ROS see HPI     Objective:   Physical Exam  Constitutional: She is oriented to person, place, and time. She appears well-developed and well-nourished. No distress.  HENT:  Head: Normocephalic and atraumatic.  TMs normal bilaterally Mild nasal congestion Throat w/out erythema, edema, or exudate  Eyes: Conjunctivae and EOM are normal. Pupils are equal, round, and reactive to light.  Neck: Normal range of motion. Neck supple.  Cardiovascular: Normal rate, regular rhythm, normal heart sounds and intact distal pulses.   No murmur heard. Pulmonary/Chest: Effort normal and breath sounds normal. No respiratory distress. She has no wheezes.  + near continuous hacking cough- improved s/p neb tx  Lymphadenopathy:    She has no cervical adenopathy.  Neurological: She is alert and oriented to person, place, and time.  Skin: Skin is warm and dry.  Psychiatric: She has a normal mood and affect. Her behavior is normal. Thought content normal.  Vitals reviewed.         Assessment & Plan:

## 2016-02-10 ENCOUNTER — Ambulatory Visit: Payer: BLUE CROSS/BLUE SHIELD | Admitting: Family Medicine

## 2016-02-18 ENCOUNTER — Ambulatory Visit: Payer: BLUE CROSS/BLUE SHIELD | Admitting: Family Medicine

## 2016-02-20 ENCOUNTER — Encounter: Payer: Self-pay | Admitting: Family Medicine

## 2016-02-20 ENCOUNTER — Ambulatory Visit (INDEPENDENT_AMBULATORY_CARE_PROVIDER_SITE_OTHER): Payer: BLUE CROSS/BLUE SHIELD | Admitting: Family Medicine

## 2016-02-20 VITALS — BP 128/82 | HR 84 | Temp 99.5°F | Resp 17 | Ht 64.0 in | Wt 190.2 lb

## 2016-02-20 DIAGNOSIS — J45998 Other asthma: Secondary | ICD-10-CM

## 2016-02-20 MED ORDER — ALBUTEROL SULFATE HFA 108 (90 BASE) MCG/ACT IN AERS: 2.0000 | INHALATION_SPRAY | Freq: Four times a day (QID) | RESPIRATORY_TRACT | Status: DC | PRN

## 2016-02-20 MED ORDER — GUAIFENESIN-CODEINE 100-10 MG/5ML PO SYRP: 10.0000 mL | ORAL_SOLUTION | Freq: Three times a day (TID) | ORAL | Status: DC | PRN

## 2016-02-20 MED ORDER — BUDESONIDE-FORMOTEROL FUMARATE 80-4.5 MCG/ACT IN AERO: 2.0000 | INHALATION_SPRAY | Freq: Two times a day (BID) | RESPIRATORY_TRACT | Status: DC

## 2016-02-20 MED ORDER — PREDNISONE 10 MG PO TABS: ORAL_TABLET | ORAL | Status: DC

## 2016-02-20 MED FILL — SYMBICORT 80-4.5 MCG INH: 80-4.5 | 30 days supply | Qty: 10 | Fill #0

## 2016-02-20 MED FILL — predniSONE 10 MG TABS: 10 | 9 days supply | Qty: 18 | Fill #0

## 2016-02-20 NOTE — Progress Notes (Signed)
   Subjective:    Patient ID: Melissa Cohen, female    DOB: 1966/03/15, 50 y.o.   MRN: [id number]  HPI Cough- seen 2 weeks ago w/ similar.  Has been coughing consistently since last visit.  Only mild improvement w/ prednisone and inhaler.  Using Flonase, Sudafed, and Delsym w/ only minimal relief.  Pt reports feeling well if cough would stop.  No fevers.  No sinus pain or pressure.  + fatigue due to lack of rest.   Review of Systems For ROS see HPI     Objective:   Physical Exam  Constitutional: She appears well-developed and well-nourished. No distress.  HENT:  Head: Normocephalic and atraumatic.  TMs normal bilaterally Mild nasal congestion Throat w/out erythema, edema, or exudate  Eyes: Conjunctivae and EOM are normal. Pupils are equal, round, and reactive to light.  Neck: Normal range of motion. Neck supple.  Cardiovascular: Normal rate, regular rhythm, normal heart sounds and intact distal pulses.   No murmur heard. Pulmonary/Chest: Effort normal and breath sounds normal. No respiratory distress. She has no wheezes.  + hacking cough  Lymphadenopathy:    She has no cervical adenopathy.  Vitals reviewed.         Assessment & Plan:

## 2016-02-20 NOTE — Progress Notes (Signed)
Pre visit review using our clinic review tool, if applicable. No additional management support is needed unless otherwise documented below in the visit note. 

## 2016-02-20 NOTE — Patient Instructions (Signed)
Follow up as needed Start the Prednisone as directed- take w/ food Start the Symbicort inhaler- 2 puffs twice daily- until feeling better Continue the Albuterol as needed for cough, shortness of breath Call with any questions or concerns Hang in there! Enjoy your trip!!

## 2016-02-22 NOTE — Assessment & Plan Note (Signed)
Pt continues to struggle w/ cough and chest tightness.  Restart Pred taper.  Add Symbicort BID.  Albuterol prn.  Reviewed supportive care and red flags that should prompt return.  Pt expressed understanding and is in agreement w/ plan.

## 2016-03-04 MED FILL — PHENTERMINE 37.5 MG TABLET: 37.5 | 30 days supply | Qty: 30 | Fill #1

## 2016-03-04 MED FILL — traZODone HCL 50 MG TABS: 50 | 30 days supply | Qty: 30 | Fill #2

## 2016-03-04 MED FILL — ESOMEPRAZOLE MAG DR 40 MG C: 40 | 30 days supply | Qty: 30 | Fill #4

## 2016-03-06 ENCOUNTER — Encounter: Payer: Self-pay | Admitting: Internal Medicine

## 2016-03-06 ENCOUNTER — Ambulatory Visit (INDEPENDENT_AMBULATORY_CARE_PROVIDER_SITE_OTHER): Payer: BLUE CROSS/BLUE SHIELD | Admitting: Internal Medicine

## 2016-03-06 VITALS — HR 76 | Temp 98.5°F | Resp 18 | Ht 64.0 in | Wt 193.0 lb

## 2016-03-06 DIAGNOSIS — J029 Acute pharyngitis, unspecified: Secondary | ICD-10-CM | POA: Insufficient documentation

## 2016-03-06 MED ORDER — HYDROCODONE-HOMATROPINE 5-1.5 MG/5ML PO SYRP: 5.0000 mL | ORAL_SOLUTION | Freq: Every evening | ORAL | Status: DC | PRN

## 2016-03-06 NOTE — Assessment & Plan Note (Addendum)
Seems to be separate infection from lingering cough from what is likely pertussis or atypical infection Rapid strep negative Discussed supportive care Refill hydrocodone cough syrup which works best for her

## 2016-03-06 NOTE — Progress Notes (Signed)
Subjective:    Patient ID: Melissa Cohen, female    DOB: 09/10/1966, 50 y.o.   MRN: [id number]  HPI Here due to ongoing respiratory symptoms  Has had really bad sore throat in the past couple of days--"like on fire" Ibuprofen has helped Still coughing--- goes back about 6 weeks. May be some better--but still coughs all night and fits during the day May have helped with prednisone---2 courses of this and z-pak at the beginning On the symbicort, albuterol, etc etc No SOB Some sweats but doesn't feel feverish  No headache now--had brief one in AM a few days ago Some head congestion and drainage continues No ear pain  Current Outpatient Prescriptions on File Prior to Visit  Medication Sig Dispense Refill  . albuterol (PROVENTIL HFA;VENTOLIN HFA) 108 (90 Base) MCG/ACT inhaler Inhale 2 puffs into the lungs every 6 (six) hours as needed for wheezing or shortness of breath. 1 Inhaler 2  . budesonide-formoterol (SYMBICORT) 80-4.5 MCG/ACT inhaler Inhale 2 puffs into the lungs 2 (two) times daily. 1 Inhaler 3  . esomeprazole (NEXIUM) 40 MG capsule Take 1 capsule (40 mg total) by mouth daily. 30 capsule 6  . fluticasone (FLONASE) 50 MCG/ACT nasal spray Place 2 sprays into both nostrils daily. 16 g 1  . Nutritional Supplements (JUICE PLUS FIBRE PO) Take 2 tablets by mouth daily.    . phentermine (ADIPEX-P) 37.5 MG tablet   0  . scopolamine (TRANSDERM-SCOP) 1 MG/3DAYS Place 1 patch (1.5 mg total) onto the skin every 3 (three) days. 4 patch 1  . traZODone (DESYREL) 50 MG tablet Take 0.5-1 tablets (25-50 mg total) by mouth at bedtime as needed for sleep. 30 tablet 3  . [DISCONTINUED] escitalopram (LEXAPRO) 10 MG tablet Take 1 tablet (10 mg total) by mouth daily. 30 tablet 6   No current facility-administered medications on file prior to visit.    Allergies  Allergen Reactions  . Cimetidine     REACTION: anaphalactic shock  . Escitalopram Oxalate     Caused ankle swelling   . Mobic  [Meloxicam] Swelling    Past Medical History  Diagnosis Date  . Ulcerative colitis     Dx at age 23  . Skin cancer, basal cell   . Gastric ulcer   . GERD (gastroesophageal reflux disease)     Past Surgical History  Procedure Laterality Date  . Appendectomy    . Uterine ablation    . Tubal ligation    . Breast enhancement surgery    . Tonsillectomy and adenoidectomy    . Cholecystectomy  2011  . Right eye lasix      02/17/12    Family History  Problem Relation Age of Onset  . Prostate cancer Father   . Hyperlipidemia Father   . Barrett's esophagus Father   . Cancer Father     prostate  . Colon cancer Father 66    appendical adenocarcinoma 2011  . Diabetes Sister   . Diabetes      Grandparents  . Heart attack Maternal Grandfather   . Heart disease Maternal Grandfather   . Ulcerative colitis Mother   . Ulcerative colitis Maternal Grandmother   . Crohn's disease Brother   . Esophageal cancer Neg Hx   . Rectal cancer Neg Hx   . Stomach cancer Neg Hx     Social History   Social History  . Marital Status: Divorced    Spouse Name: N/A  . Number of Children: 3  . Years  of Education: N/A   Occupational History  . Nursing secretary  [place] History Main Topics  . Smoking status: Former Smoker -- 0.50 packs/day for 25 years    Types: Cigarettes    Quit date: 11/29/2005  . Smokeless tobacco: Never Used  . Alcohol Use: 1.8 oz/week    3 Glasses of wine per week     Comment: every other weekend socially  . Drug Use: No  . Sexual Activity: Not on file   Other Topics Concern  . Not on file   Social History Narrative   Review of Systems  No rash No vomiting or diarrhea Appetite is okay Feels okay --just still coughing and slightly tired during the day     Objective:   Physical Exam  Constitutional: She appears well-developed. No distress.  HENT:  No sinus tenderness TMs normal Mild pharyngeal injection without tonsillar enlargement or  exudate Mild nasal congestion  Neck: Normal range of motion. Neck supple.  Pulmonary/Chest: Effort normal and breath sounds normal. No respiratory distress. She has no wheezes. She has no rales.  Lymphadenopathy:    She has no cervical adenopathy.          Assessment & Plan:

## 2016-03-06 NOTE — Progress Notes (Signed)
Pre visit review using our clinic review tool, if applicable. No additional management support is needed unless otherwise documented below in the visit note. 

## 2016-03-07 ENCOUNTER — Encounter: Payer: Self-pay | Admitting: Family Medicine

## 2016-03-08 MED ORDER — BENZONATATE 200 MG PO CAPS: 200.0000 mg | ORAL_CAPSULE | Freq: Two times a day (BID) | ORAL | Status: DC | PRN

## 2016-03-08 MED FILL — BENZONATATE 200 MG CAPSULE: 200 | 10 days supply | Qty: 20 | Fill #0

## 2016-03-08 NOTE — Telephone Encounter (Signed)
Medication filled to pharmacy as requested.   

## 2016-03-10 ENCOUNTER — Encounter: Payer: Self-pay | Admitting: Family Medicine

## 2016-03-10 ENCOUNTER — Ambulatory Visit (INDEPENDENT_AMBULATORY_CARE_PROVIDER_SITE_OTHER): Payer: BLUE CROSS/BLUE SHIELD | Admitting: Family Medicine

## 2016-03-10 ENCOUNTER — Ambulatory Visit (HOSPITAL_BASED_OUTPATIENT_CLINIC_OR_DEPARTMENT_OTHER)
Admission: RE | Admit: 2016-03-10 | Discharge: 2016-03-10 | Disposition: A | Discharge status: Home / Self Care | Payer: BLUE CROSS/BLUE SHIELD | Source: Ambulatory Visit | Attending: Family Medicine | Admitting: Family Medicine

## 2016-03-10 VITALS — BP 108/70 | HR 91 | Temp 98.7°F | Resp 18 | Ht 64.0 in | Wt 193.8 lb

## 2016-03-10 DIAGNOSIS — J029 Acute pharyngitis, unspecified: Secondary | ICD-10-CM | POA: Diagnosis not present

## 2016-03-10 DIAGNOSIS — R05 Cough: Secondary | ICD-10-CM

## 2016-03-10 DIAGNOSIS — R0602 Shortness of breath: Secondary | ICD-10-CM | POA: Diagnosis not present

## 2016-03-10 DIAGNOSIS — R053 Chronic cough: Secondary | ICD-10-CM

## 2016-03-10 LAB — POCT RAPID STREP A (OFFICE): Rapid Strep A Screen: NEGATIVE

## 2016-03-10 MED ORDER — CEFDINIR 300 MG PO CAPS: 300.0000 mg | ORAL_CAPSULE | Freq: Two times a day (BID) | ORAL | Status: DC

## 2016-03-10 MED ORDER — MAGIC MOUTHWASH W/LIDOCAINE: 5.0000 mL | Freq: Four times a day (QID) | ORAL | Status: DC | PRN

## 2016-03-10 MED FILL — MAGICMW WITH LIDO 3:1 (25%): 7 days supply | Qty: 120 | Fill #0

## 2016-03-10 MED FILL — CEFDINIR 300 MG CAPSULE: 300 | 10 days supply | Qty: 20 | Fill #0

## 2016-03-10 NOTE — Progress Notes (Signed)
Pre visit review using our clinic review tool, if applicable. No additional management support is needed unless otherwise documented below in the visit note. 

## 2016-03-10 NOTE — Patient Instructions (Signed)
I sent your antibiotic and also the numbing mouthwash to the pharmacy downstairs for you.   Please double check with the pharmacist to make sure there is no allergy concern (the computer pulled up a cross reactivity with your lexapro allergy)  Please also have your chest x-ray before you go home Let me know how you are doing over the next few days!

## 2016-03-10 NOTE — Progress Notes (Signed)
[place] [facility] [address] [place], [place] [address] [phone]  Date:  03/10/2016   Name:  Melissa Cohen   DOB:  08-02-1966   MRN:  [id number]  PCP:  Annye Asa, MD    Chief Complaint: Cough   History of Present Illness:  Melissa Cohen is a 50 y.o. very pleasant female patient who presents with the following:  Here today to recheck on illness.   She was here with bronchitis in mid February- treated with hycodan, azithromcyin Returned on 3/10 with complaint of persistent cough.  Treated with prednisone and albuterol at home prn Returned on 3/24 with cough again, she had only noted a mild improvement.  Restarted prednisone, symbicort BID Then seen this past weekend with ST and cough by the Saturday clinic- at that time she had complaint of severe ST. Strep negative. Refilled her hydrocodone She emailed in a couple of days later for tessalon perles Email came to me today that she was still sick so I asked her to come in.   She notes that "my throat hurts so bad for 6 days straight."  She thinks that she may have noted some white spots on her posterior oropharynx.  No fever noted.  She is taking motrin and tylenol around the clock She has traveled a good bit over the last couple of months- she went on 2 cruises most recently in 3/25.    She notes that the prednisone helps while she is taking it. No sick contacts at home She is not sleeping well due to cough  Her abs hurt from coughing The cough is occasionally productive but usually just clear mucus.   No vomiting She does feel like her neck glands are sore.    The hycodan does work for her at night and controls her cough History of T and A as a teen  Menopausal for about 3 years   Patient Active Problem List   Diagnosis Date Noted  . Acute pharyngitis 03/06/2016  . Post viral RAD (reactive airway disease) 02/06/2016  . Urinary frequency  12/18/2015  . Menopausal syndrome (hot flushes) 08/23/2014  . Screening for malignant neoplasm of the cervix 02/25/2014  . Bronchospasm with bronchitis, acute 05/24/2013  . URI (upper respiratory infection) 05/22/2013  . General medical examination 12/07/2011  . GERD (gastroesophageal reflux disease) 10/08/2011  . Anxiety and depression 10/08/2011  . ABSCESS, FINGER 08/25/2011  . SACROILIAC JOINT DYSFUNCTION 04/12/2011  . TROCHANTERIC BURSITIS, BILATERAL 04/12/2011  . NEVUS, ATYPICAL 07/14/2010  . VAGINITIS 07/14/2010  . Cervical radiculopathy 07/14/2010  . GERD 12/31/2009  . CHEST PAIN, PLEURITIC 11/18/2009  . DIZZINESS 11/10/2009  . CHEST PAIN 11/10/2009  . CARCINOMA, BASAL CELL 03/14/2008  . VITAMIN D DEFICIENCY 03/06/2008  . HYPERTRIGLYCERIDEMIA 03/06/2008  . FATIGUE 03/04/2008  . ULCERATIVE COLITIS 02/09/2007  . INSOMNIA 02/09/2007    Past Medical History  Diagnosis Date  . Ulcerative colitis     Dx at age 88  . Skin cancer, basal cell   . Gastric ulcer   . GERD (gastroesophageal reflux disease)     Past Surgical History  Procedure Laterality Date  . Appendectomy    . Uterine ablation    . Tubal ligation    . Breast enhancement surgery    . Tonsillectomy and adenoidectomy    . Cholecystectomy  2011  . Right eye lasix      02/17/12    Social History  Substance Use Topics  . Smoking status: Former Smoker -- 0.50 packs/day for 25 years    Types: Cigarettes    Quit date: 11/29/2005  . Smokeless tobacco: Never Used  . Alcohol Use: 1.8 oz/week    3 Glasses of wine per week     Comment: every other weekend socially    Family History  Problem Relation Age of Onset  . Prostate cancer Father   . Hyperlipidemia Father   . Barrett's esophagus Father   . Cancer Father     prostate  . Colon cancer Father 41    appendical adenocarcinoma 2011  . Diabetes Sister   . Diabetes      Grandparents  . Heart attack Maternal Grandfather   . Heart disease Maternal  Grandfather   . Ulcerative colitis Mother   . Ulcerative colitis Maternal Grandmother   . Crohn's disease Brother   . Esophageal cancer Neg Hx   . Rectal cancer Neg Hx   . Stomach cancer Neg Hx     Allergies  Allergen Reactions  . Cimetidine     REACTION: anaphalactic shock  . Escitalopram Oxalate     Caused ankle swelling   . Mobic [Meloxicam] Swelling    Medication list has been reviewed and updated.  Current Outpatient Prescriptions on File Prior to Visit  Medication Sig Dispense Refill  . albuterol (PROVENTIL HFA;VENTOLIN HFA) 108 (90 Base) MCG/ACT inhaler Inhale 2 puffs into the lungs every 6 (six) hours as needed for wheezing or shortness of breath. 1 Inhaler 2  . benzonatate (TESSALON) 200 MG capsule Take 1 capsule (200 mg total) by mouth 2 (two) times daily as needed for cough. 20 capsule 0  . budesonide-formoterol (SYMBICORT) 80-4.5 MCG/ACT inhaler Inhale 2 puffs into the lungs 2 (two) times daily. 1 Inhaler 3  . esomeprazole (NEXIUM) 40 MG capsule Take 1 capsule (40 mg total) by mouth daily. 30 capsule 6  . fluticasone (FLONASE) 50 MCG/ACT nasal spray Place 2 sprays into both nostrils daily. 16 g 1  . HYDROcodone-homatropine (HYCODAN) 5-1.5 MG/5ML syrup Take 5 mLs by mouth at bedtime as needed for cough. 120 mL 0  . phentermine (ADIPEX-P) 37.5 MG tablet   0  . traZODone (DESYREL) 50 MG tablet Take 0.5-1 tablets (25-50 mg total) by mouth at bedtime as needed for sleep. 30 tablet 3  . Nutritional Supplements (JUICE PLUS FIBRE PO) Take 2 tablets by mouth daily. Reported on 03/10/2016    . [DISCONTINUED] escitalopram (LEXAPRO) 10 MG tablet Take 1 tablet (10 mg total) by mouth daily. 30 tablet 6   No current facility-administered medications on file prior to visit.    Review of Systems:  As per HPI- otherwise negative.   Physical Examination: There were no vitals filed for this visit. Filed Vitals:   03/10/16 1548  Height: 5' 4"  (1.626 m)  Weight: 193 lb 12.8 oz  (87.907 kg)   Body mass index is 33.25 kg/(m^2). Ideal Body Weight: Weight in (lb) to have BMI = 25: 145.3  GEN: WDWN, NAD, Non-toxic, A & O x 3, obese, looks well, coughs frequently  HEENT: Atraumatic, Normocephalic. Neck supple. No masses, No LAD.  Bilateral TM wnl, oropharynx normal s/p tonsillectomy.  PEERL,EOMI.   Ears and Nose: No external deformity. CV: RRR, No M/G/R. No JVD. No thrill. No extra heart sounds. PULM: CTA B, no wheezes, crackles, rhonchi. No retractions. No resp. distress. No accessory muscle use. EXTR: No c/c/e NEURO Normal gait.  PSYCH: Normally interactive. Conversant. Not  depressed or anxious appearing.  Calm demeanor.   CXR today Dg Chest 2 View  03/10/2016  CLINICAL DATA:  Cough for 2 months and shortness of Breath EXAM: CHEST  2 VIEW COMPARISON:  11/10/2009 FINDINGS: The heart size and mediastinal contours are within normal limits. Both lungs are clear. The visualized skeletal structures are unremarkable. IMPRESSION: No active cardiopulmonary disease. Electronically Signed   By: Inez Catalina M.D.   On: 03/10/2016 16:54    Results for orders placed or performed in visit on 03/10/16  POCT rapid strep A  Result Value Ref Range   Rapid Strep A Screen Negative Negative     Assessment and Plan: Sore throat - Plan: POCT rapid strep A, cefdinir (OMNICEF) 300 MG capsule, magic mouthwash w/lidocaine SOLN  Persistent cough - Plan: cefdinir (OMNICEF) 300 MG capsule, DG Chest 2 View  Here today with complaint of 2 months of illness.  She has noted a persistent cough and now ST. Will treat her with omnicef as she has not been on abx since she took azithromycin in February Continue cough syrup and inhaler Magic mouthwash rx- pulled up a warning about possible cross- reaction with her lexapro allergy.  She reports that her ankles swelled with lexapro, no serious allergic reaction.  I do not think that this should be a concern but she agrees to check with pharmD when she  picks up the medication  She will keep me posted as to her progress   Signed Lamar Blinks, MD

## 2016-03-11 ENCOUNTER — Encounter: Payer: Self-pay | Admitting: Family Medicine

## 2016-03-11 LAB — CBC WITH DIFFERENTIAL/PLATELET
BASOS ABS: 0 10*3/uL (ref 0.0–0.1)
BASOS PCT: 0.4 % (ref 0.0–3.0)
EOS ABS: 0.2 10*3/uL (ref 0.0–0.7)
Eosinophils Relative: 2.5 % (ref 0.0–5.0)
HCT: 38.6 % (ref 36.0–46.0)
Hemoglobin: 13 g/dL (ref 12.0–15.0)
LYMPHS PCT: 27.2 % (ref 12.0–46.0)
Lymphs Abs: 2.2 10*3/uL (ref 0.7–4.0)
MCHC: 33.7 g/dL (ref 30.0–36.0)
MCV: 88.1 fl (ref 78.0–100.0)
Monocytes Absolute: 0.5 10*3/uL (ref 0.1–1.0)
Monocytes Relative: 6.7 % (ref 3.0–12.0)
NEUTROS PCT: 63.2 % (ref 43.0–77.0)
Neutro Abs: 5.2 10*3/uL (ref 1.4–7.7)
PLATELETS: 311 10*3/uL (ref 150.0–400.0)
RBC: 4.39 Mil/uL (ref 3.87–5.11)
RDW: 15.7 % — ABNORMAL HIGH (ref 11.5–15.5)
WBC: 8.2 10*3/uL (ref 4.0–10.5)

## 2016-03-11 LAB — EPSTEIN-BARR VIRUS VCA ANTIBODY PANEL
EBV EA IGG: 22.5 U/mL — AB (ref <9.0)
EBV NA IgG: 274 U/mL — ABNORMAL HIGH (ref <18.0)
EBV VCA IgG: 480 U/mL — ABNORMAL HIGH (ref <18.0)

## 2016-03-12 ENCOUNTER — Encounter: Payer: Self-pay | Admitting: Family Medicine

## 2016-03-13 ENCOUNTER — Ambulatory Visit (INDEPENDENT_AMBULATORY_CARE_PROVIDER_SITE_OTHER): Payer: BLUE CROSS/BLUE SHIELD | Admitting: Family Medicine

## 2016-03-13 ENCOUNTER — Encounter: Payer: Self-pay | Admitting: Family Medicine

## 2016-03-13 VITALS — BP 122/80 | HR 89 | Temp 98.2°F | Resp 20 | Ht 64.0 in | Wt 195.0 lb

## 2016-03-13 DIAGNOSIS — R822 Biliuria: Secondary | ICD-10-CM | POA: Diagnosis not present

## 2016-03-13 DIAGNOSIS — J029 Acute pharyngitis, unspecified: Secondary | ICD-10-CM

## 2016-03-13 DIAGNOSIS — R053 Chronic cough: Secondary | ICD-10-CM

## 2016-03-13 DIAGNOSIS — R05 Cough: Secondary | ICD-10-CM | POA: Diagnosis not present

## 2016-03-13 MED ORDER — HYDROCODONE-HOMATROPINE 5-1.5 MG/5ML PO SYRP: 5.0000 mL | ORAL_SOLUTION | ORAL | Status: DC | PRN

## 2016-03-13 MED ORDER — PREDNISONE 10 MG PO TABS: ORAL_TABLET | ORAL | Status: DC

## 2016-03-13 NOTE — Progress Notes (Signed)
Subjective:    Patient ID: Melissa Cohen, female    DOB: [birth year], 50 y.o.   MRN: [id number]  HPI Here for ongoing coughing and ST for the past 6 weeks. She first felt sick in February with chest tightness and a dry cough. She was seen and treated with a Zpack and several  rounds of prednisone. Later Symbicort and albuterol inhalers were added with no real effects. Later she also developed a severe ST. She was seen again on 03-06-16 and and had a negative rapid strep test. This was felt to be a viral infection and she was given some cough syrup. Then she was seen again on 03-10-16 and she was started on Omnicef which she has been taking since then. She had a normal CXR. She had another negative rapid strep test and normal CBC. She had titers for EBV which were positive for IgG to VCA, to EA, and to NA, but the IgM was negative. She noted that she was on 2 cruises to the [place] in March. She comes in today very frustrated that she still has the dry cough and a ST, and she now has noted a brown color to the urine. No urinary urgency or burning. She did a home UA test which showed positive ketones and bilirubin, with negative leukocyte esterace and nitrites. She denies any abdominal pain or nausea or fever. No NVD.   Review of Systems  Constitutional: Positive for fatigue. Negative for fever, chills and diaphoresis.  HENT: Negative.   Eyes: Negative.   Respiratory: Positive for cough. Negative for chest tightness, shortness of breath and wheezing.   Cardiovascular: Negative.   Gastrointestinal: Negative.   Genitourinary: Negative for dysuria, urgency, frequency, hematuria, flank pain, difficulty urinating and pelvic pain.  Skin: Negative for rash.       Objective:   Physical Exam  Constitutional: She is oriented to person, place, and time. She appears well-developed and well-nourished. No distress.  HENT:  Right Ear: External ear normal.  Left Ear: External ear normal.  Nose: Nose  normal.  Mouth/Throat: Oropharynx is clear and moist. No oropharyngeal exudate.  Eyes: Conjunctivae and EOM are normal. Pupils are equal, round, and reactive to light. No scleral icterus.  Neck: Neck supple. No thyromegaly present.  Cardiovascular: Normal rate, regular rhythm, normal heart sounds and intact distal pulses.   Pulmonary/Chest: Effort normal and breath sounds normal. No respiratory distress. She has no wheezes. She has no rales.  Abdominal: Soft. Bowel sounds are normal. She exhibits no distension and no mass. There is no tenderness. There is no rebound and no guarding.  No HSM  Musculoskeletal: She exhibits no edema.  Lymphadenopathy:    She has no cervical adenopathy.  Neurological: She is alert and oriented to person, place, and time.  Skin: No rash noted.          Assessment & Plan:  She has had a persistent dry cough for over 6 weeks, and now also has a mild ST and some bilirubinuria. She says she had mononucleosis as a teenager, but a viral infection like EBV could explain a lot of her symptoms. Since we have no ability to order any further testing in the Saturday clinic, I recommended she take another round of prednisone for comfort measures. It has reduced her cough and her ST in the past. She will follow up with her PCP next Monday to assess further. She may need a referral to Pulmonary for the chronic cough. Even though her  symptoms appear to be consistent with a viral infection, she will continue on the [place]. She will need lab testing on Monday as well including a liver panel.

## 2016-03-15 ENCOUNTER — Encounter: Payer: Self-pay | Admitting: Gastroenterology

## 2016-03-16 ENCOUNTER — Encounter: Payer: Self-pay | Admitting: Family Medicine

## 2016-03-16 ENCOUNTER — Ambulatory Visit (INDEPENDENT_AMBULATORY_CARE_PROVIDER_SITE_OTHER): Payer: BLUE CROSS/BLUE SHIELD | Admitting: Family Medicine

## 2016-03-16 ENCOUNTER — Telehealth: Payer: Self-pay | Admitting: Behavioral Health

## 2016-03-16 VITALS — BP 128/83 | HR 91 | Temp 98.2°F | Resp 18 | Ht 64.0 in | Wt 200.2 lb

## 2016-03-16 DIAGNOSIS — J45998 Other asthma: Secondary | ICD-10-CM | POA: Diagnosis not present

## 2016-03-16 MED ORDER — BUDESONIDE-FORMOTEROL FUMARATE 160-4.5 MCG/ACT IN AERO: 2.0000 | INHALATION_SPRAY | Freq: Two times a day (BID) | RESPIRATORY_TRACT | Status: DC

## 2016-03-16 MED ORDER — IPRATROPIUM-ALBUTEROL 0.5-2.5 (3) MG/3ML IN SOLN
3.0000 mL | Freq: Once | RESPIRATORY_TRACT | Status: AC
Start: 2016-03-16 — End: 2016-03-16
  Administered 2016-03-16: 3 mL via RESPIRATORY_TRACT

## 2016-03-16 MED ORDER — TRAMADOL HCL 50 MG PO TABS: 50.0000 mg | ORAL_TABLET | Freq: Three times a day (TID) | ORAL | Status: DC | PRN

## 2016-03-16 MED ORDER — AZELASTINE HCL 0.1 % NA SOLN: 2.0000 | Freq: Two times a day (BID) | NASAL | Status: DC

## 2016-03-16 MED FILL — AZELASTINE 0.1% (137 MCG) S: 0.1 | 30 days supply | Qty: 30 | Fill #0

## 2016-03-16 MED FILL — SYMBICORT 160-4.5 MCG INH: 160-4.5 | 30 days supply | Qty: 10 | Fill #0

## 2016-03-16 MED FILL — traMADol HCL 50 MG TABS: 50 | 15 days supply | Qty: 45 | Fill #0

## 2016-03-16 NOTE — Addendum Note (Signed)
Addended by: Davis Gourd on: 03/16/2016 11:49 AM   Modules accepted: Orders

## 2016-03-16 NOTE — Progress Notes (Signed)
   Subjective:    Patient ID: Melissa Cohen, female    DOB: 1966-07-20, 50 y.o.   MRN: [id number]  HPI Cough- pt has had cough x10 weeks.  Was seen on 4/12 and 4/15- had normal CXR on 4/12 and was started on Omnicef for possible bacterial infxn.  Prednisone was added again on 4/15 along w/ Hydromet cough syrup.  Minimal relief.  Pt rested all weekend and fatigue has improved.  Pt reports 'razor blade' sore throat x2 weeks that improved w/ tylenol and ibuprofen.  Pt reports sxs are consistent w/ re-activation of mono (labs show past infxn)  Pt reports she had red blood blood on toilet tissue and in stool (hx of both internal and external hemorrhoids, UC) and very dark urine over the weekend.  Urine was clear this AM.   Review of Systems For ROS see HPI     Objective:   Physical Exam  Constitutional: She is oriented to person, place, and time. She appears well-developed and well-nourished. No distress.  HENT:  Head: Normocephalic and atraumatic.  TMs normal bilaterally Mild nasal congestion Throat w/out erythema, edema, or exudate  Eyes: Conjunctivae and EOM are normal. Pupils are equal, round, and reactive to light.  Neck: Normal range of motion. Neck supple.  Cardiovascular: Normal rate, regular rhythm, normal heart sounds and intact distal pulses.   No murmur heard. Pulmonary/Chest: Effort normal and breath sounds normal. No respiratory distress. She has no wheezes.  + continuous hacking cough- improved mildly after neb tx  Lymphadenopathy:    She has no cervical adenopathy.  Neurological: She is alert and oriented to person, place, and time.  Skin: Skin is warm and dry.  Psychiatric: She has a normal mood and affect. Her behavior is normal. Thought content normal.  Vitals reviewed.         Assessment & Plan:

## 2016-03-16 NOTE — Progress Notes (Signed)
Pre visit review using our clinic review tool, if applicable. No additional management support is needed unless otherwise documented below in the visit note. 

## 2016-03-16 NOTE — Patient Instructions (Signed)
Follow up as needed We'll call you with your pulmonary appt Start the Tramadol 3x/day to improve cough Continue the prednisone as directed Continue the Symbicort and the albuterol Drink plenty of fluids REST! Call with any questions or concerns Hang in there!!!

## 2016-03-16 NOTE — Telephone Encounter (Addendum)
[facility] note received via fax  Call Date: 03/12/16 Time: 6:47 PM   Caller: Melissa Cohen (Self) Return number: [phone]  Nurse: Mike Gip, RN  Chief Complaint: Paging or Request for consult  Reason for call: Caller would like to know what the outcome of her lab work on Wednesday. The caller states she was tested for Epstein-Barr. The caller has been on several rounds of steroids and antibiotics. The caller is still very ill. Could the on call doctor be paged about the results of the labs.  Disposition: Called on-call provider  Patient was seen in office on 03/13/16.

## 2016-03-16 NOTE — Assessment & Plan Note (Signed)
Deteriorated.  Pt's cough has persisted x10 weeks despite prednisone, inhalers, abx.  She had a clear CXR on 4/12.  She is on Symbicort but we will increase the dose.  Add Astelin nasal spray.  Attempt to control cough w/ Tramadol- explained this to pt and discussed that it is a pain med but has efficacy in cough relief.  Pt has appt w/ allergy and asthma tomorrow but needs to see pulmonary- referral placed.  Reviewed supportive care and red flags that should prompt return.  Pt expressed understanding and is in agreement w/ plan.

## 2016-03-17 ENCOUNTER — Ambulatory Visit (INDEPENDENT_AMBULATORY_CARE_PROVIDER_SITE_OTHER): Payer: BLUE CROSS/BLUE SHIELD | Admitting: Allergy and Immunology

## 2016-03-17 ENCOUNTER — Encounter: Payer: Self-pay | Admitting: Allergy and Immunology

## 2016-03-17 VITALS — BP 130/86 | HR 88 | Temp 98.2°F | Resp 20 | Ht 64.0 in | Wt 200.2 lb

## 2016-03-17 DIAGNOSIS — R05 Cough: Secondary | ICD-10-CM | POA: Diagnosis not present

## 2016-03-17 DIAGNOSIS — J310 Chronic rhinitis: Secondary | ICD-10-CM | POA: Diagnosis not present

## 2016-03-17 DIAGNOSIS — R053 Chronic cough: Secondary | ICD-10-CM | POA: Insufficient documentation

## 2016-03-17 DIAGNOSIS — J209 Acute bronchitis, unspecified: Secondary | ICD-10-CM | POA: Diagnosis not present

## 2016-03-17 DIAGNOSIS — K219 Gastro-esophageal reflux disease without esophagitis: Secondary | ICD-10-CM | POA: Diagnosis not present

## 2016-03-17 DIAGNOSIS — J40 Bronchitis, not specified as acute or chronic: Secondary | ICD-10-CM | POA: Diagnosis not present

## 2016-03-17 MED ORDER — RANITIDINE HCL 300 MG PO TABS: 300.0000 mg | ORAL_TABLET | Freq: Every day | ORAL | Status: DC

## 2016-03-17 MED ORDER — CARBINOXAMINE MALEATE ER 4 MG/5ML PO SUER: 16.0000 mg | Freq: Two times a day (BID) | ORAL | Status: DC

## 2016-03-17 MED FILL — raNITIdine HCL 300 MG TABS: 300 | 30 days supply | Qty: 30 | Fill #0

## 2016-03-17 NOTE — Assessment & Plan Note (Addendum)
The most common causes of chronic cough include the following: upper airway cough syndrome (UACS) which is caused by variety of rhinosinus conditions; asthma; gastroesophageal reflux disease (GERD); chronic bronchitis from cigarette smoking or other inhaled environmental irritants; non-asthmatic eosinophilic bronchitis; and bronchiectasis. In prospective studies, these conditions have accounted for up to 94% of the causes of chronic cough in immunocompetent adults. The history and physical examination suggest that her cough is multifactorial with contribution from postnasal drainage, reflux, and possible bronchial hyperresponsiveness. We will address these issues at this time (see below). We will regroup in 6 weeks to assess treatment response and adjust therapy accordingly.

## 2016-03-17 NOTE — Patient Instructions (Addendum)
Persistent cough The most common causes of chronic cough include the following: upper airway cough syndrome (UACS) which is caused by variety of rhinosinus conditions; asthma; gastroesophageal reflux disease (GERD); chronic bronchitis from cigarette smoking or other inhaled environmental irritants; non-asthmatic eosinophilic bronchitis; and bronchiectasis. In prospective studies, these conditions have accounted for up to 94% of the causes of chronic cough in immunocompetent adults. The history and physical examination suggest that her cough is multifactorial with contribution from postnasal drainage, reflux, and possible bronchial hyperresponsiveness. We will address these issues at this time (see below). We will regroup in 6 weeks to assess treatment response and adjust therapy accordingly.  Chronic rhinitis  Start azelastine, 2 sprays per nostril twice a day.  Continue fluticasone nasal spray, spray per nostril twice daily.  Nasal saline lavage (NeilMed) as needed has been recommended along with instructions for proper administration.  A prescription has been provided for [facility] (cabinoxamine) 8-16 mg twice daily as needed.  GERD (gastroesophageal reflux disease)  Appropriate reflux lifestyle modifications have been discussed and provided in written form.  Continue Nexium 40 mg, 30 minutes prior to breakfast.  Add ranitidine 300 mg daily at bedtime.  Bronchospasm with bronchitis  For now, continue Symbicort 160/4.5 g, 2 inhalations twice a day.  To maximize pulmonary deposition, a spacer has been provided along with instructions for its proper administration with an HFA inhaler.  Continue albuterol every 4-6 hours as needed.    Return in about 6 weeks (around 04/28/2016), or if symptoms worsen or fail to improve.

## 2016-03-17 NOTE — Assessment & Plan Note (Addendum)
   Appropriate reflux lifestyle modifications have been discussed and provided in written form.  Continue Nexium 40 mg, 30 minutes prior to breakfast.  Add ranitidine 300 mg daily at bedtime.

## 2016-03-17 NOTE — Progress Notes (Signed)
New Patient Note  RE: Melissa Cohen MRN: [id number] DOB: 15-Jul-1966 Date of Office Visit: 03/17/2016  Referring provider: Midge Minium, MD Primary care provider: Annye Asa, MD  Chief Complaint: Cough   History of present illness: HPI Comments: Melissa Cohen is a 50 y.o. female presenting today for consultation of persistent cough.  She reports that approximately 10 weeks ago she was on a cruise and developed symptoms consistent with an upper rest or tract infection including nasal congestion, postnasal drainage, sore throat, and coughing.  The postnasal drainage and coughing have persisted.  The cough is described as nonproductive without diurnal variation.  Hycodan at bedtime helps to reduce the cough through the night.  She denies wheezing or chest tightness.  She takes a proton pump inhibitor for gastroesophageal reflux disease and admits to heartburn and/or water brash if she misses 1 or 2 days of the Nexium.  She was started on Symbicort 160/4.5, 2 inhalations twice a day with mild benefit.  She currently does not use a spacer with the HFA device.  She is currently using fluticasone nasal spray and was prescribed azelastine nasal spray yesterday but has not started this medication yet.  She is a former cigarette smoker having quit in 2007 with the 12th-pack-year history.  She had a two-view chest x-ray on 03/10/2016 which was interpreted as: No active cardiopulmonary disease.   Assessment and plan: Persistent cough The most common causes of chronic cough include the following: upper airway cough syndrome (UACS) which is caused by variety of rhinosinus conditions; asthma; gastroesophageal reflux disease (GERD); chronic bronchitis from cigarette smoking or other inhaled environmental irritants; non-asthmatic eosinophilic bronchitis; and bronchiectasis. In prospective studies, these conditions have accounted for up to 94% of the causes of chronic cough in immunocompetent  adults. The history and physical examination suggest that her cough is multifactorial with contribution from postnasal drainage, reflux, and possible bronchial hyperresponsiveness. We will address these issues at this time (see below). We will regroup in 6 weeks to assess treatment response and adjust therapy accordingly.  Chronic rhinitis  Start azelastine, 2 sprays per nostril twice a day.  Continue fluticasone nasal spray, spray per nostril twice daily.  Nasal saline lavage (NeilMed) as needed has been recommended along with instructions for proper administration.  A prescription has been provided for [facility] (cabinoxamine) 8-16 mg twice daily as needed.  GERD (gastroesophageal reflux disease)  Appropriate reflux lifestyle modifications have been discussed and provided in written form.  Continue Nexium 40 mg, 30 minutes prior to breakfast.  Add ranitidine 300 mg daily at bedtime.  Bronchospasm with bronchitis  For now, continue Symbicort 160/4.5 g, 2 inhalations twice a day.  To maximize pulmonary deposition, a spacer has been provided along with instructions for its proper administration with an HFA inhaler.  Continue albuterol every 4-6 hours as needed.    Meds ordered this encounter  Medications  . ranitidine (ZANTAC) 300 MG tablet    Sig: Take 1 tablet (300 mg total) by mouth at bedtime.    Dispense:  30 tablet    Refill:  5  . Carbinoxamine Maleate ER [facility]) 4 MG/5ML SUER    Sig: Take 16 mg by mouth 2 (two) times daily.    Dispense:  480 mL    Refill:  5    Diagnositics: Spirometry: FVC was 2.82 L and FEV1 was 2.48 L (95% predicted) without significant postbronchodilator improvement. Epicutaneous testing: Negative despite a positive histamine control. Intradermal testing: Negative despite  a positive histamine control.    Physical examination: Blood pressure 130/86, pulse 88, temperature 98.2 F (36.8 C), temperature source Oral, resp. rate 20,  height 5' 4"  (1.626 m), weight 200 lb 4 oz (90.833 kg), last menstrual period 09/02/2013.  General: Alert, interactive, in no acute distress. HEENT: TMs pearly gray, turbinates edematous without discharge, post-pharynx erythematous. Neck: Supple without lymphadenopathy. Lungs: Clear to auscultation without wheezing, rhonchi or rales. CV: Normal S1, S2 without murmurs. Abdomen: Nondistended, nontender. Skin: Warm and dry, without lesions or rashes. Extremities:  No clubbing, cyanosis or edema. Neuro:   Grossly intact.  Review of systems:  Review of Systems  Constitutional: Negative for fever, chills and weight loss.  HENT: Positive for congestion. Negative for nosebleeds.   Eyes: Negative for blurred vision.  Respiratory: Positive for cough. Negative for hemoptysis, shortness of breath and wheezing.   Cardiovascular: Negative for chest pain.  Gastrointestinal: Negative for diarrhea and constipation.  Genitourinary: Negative for dysuria.  Musculoskeletal: Negative for myalgias and joint pain.  Skin: Negative for itching and rash.  Neurological: Negative for dizziness.  Endo/Heme/Allergies: Does not bruise/bleed easily.    Past medical history:  Past Medical History  Diagnosis Date  . Ulcerative colitis     Dx at age 49  . Skin cancer, basal cell   . Gastric ulcer   . GERD (gastroesophageal reflux disease)     Past surgical history:  Past Surgical History  Procedure Laterality Date  . Appendectomy    . Uterine ablation    . Tubal ligation    . Breast enhancement surgery    . Tonsillectomy and adenoidectomy    . Cholecystectomy  2011  . Right eye lasix      02/17/12    Family history: Family History  Problem Relation Age of Onset  . Prostate cancer Father   . Hyperlipidemia Father   . Barrett's esophagus Father   . Cancer Father     prostate  . Colon cancer Father 43    appendical adenocarcinoma 2011  . Diabetes Sister   . Diabetes      Grandparents  . Heart  attack Maternal Grandfather   . Heart disease Maternal Grandfather   . Ulcerative colitis Mother   . Ulcerative colitis Maternal Grandmother   . Crohn's disease Brother   . Esophageal cancer Neg Hx   . Rectal cancer Neg Hx   . Stomach cancer Neg Hx   . Allergic rhinitis Neg Hx   . Angioedema Neg Hx   . Asthma Neg Hx   . Atopy Neg Hx   . Eczema Neg Hx   . Immunodeficiency Neg Hx     Social history: Social History   Social History  . Marital Status: Divorced    Spouse Name: N/A  . Number of Children: 3  . Years of Education: N/A   Occupational History  . Nursing secretary  [place] History Main Topics  . Smoking status: Former Smoker -- 0.50 packs/day for 25 years    Types: Cigarettes    Quit date: 11/29/2005  . Smokeless tobacco: Never Used  . Alcohol Use: 1.8 oz/week    3 Glasses of wine per week     Comment: every other weekend socially  . Drug Use: No  . Sexual Activity: Not on file   Other Topics Concern  . Not on file   Social History Narrative   Environmental History: The patient lives in a 52 year old house with carpeting in the  bedroom, gassy heat and central air.  There are dogs and cats in the house, cat to have access to her bedroom.  She is a former cigarette smoker having quit in 2007 with a 12-pack-year history.    Medication List       This list is accurate as of: 03/17/16  6:24 PM.  Always use your most recent med list.               albuterol 108 (90 Base) MCG/ACT inhaler  Commonly known as:  PROVENTIL HFA;VENTOLIN HFA  Inhale 2 puffs into the lungs every 6 (six) hours as needed for wheezing or shortness of breath.     azelastine 0.1 % nasal spray  Commonly known as:  ASTELIN  Place 2 sprays into both nostrils 2 (two) times daily. Use in each nostril as directed     benzonatate 200 MG capsule  Commonly known as:  TESSALON  Take 1 capsule (200 mg total) by mouth 2 (two) times daily as needed for cough.      budesonide-formoterol 160-4.5 MCG/ACT inhaler  Commonly known as:  SYMBICORT  Inhale 2 puffs into the lungs 2 (two) times daily.     Carbinoxamine Maleate ER 4 MG/5ML Suer  Commonly known as:  KARBINAL ER  Take 16 mg by mouth 2 (two) times daily.     cefdinir 300 MG capsule  Commonly known as:  OMNICEF  Take 1 capsule (300 mg total) by mouth 2 (two) times daily.     cetirizine 10 MG tablet  Commonly known as:  ZYRTEC  Take 10 mg by mouth daily. Reported on 03/17/2016     DELSYM PO  Take 10 mLs by mouth daily.     esomeprazole 40 MG capsule  Commonly known as:  NEXIUM  Take 1 capsule (40 mg total) by mouth daily.     fluticasone 50 MCG/ACT nasal spray  Commonly known as:  FLONASE  Place 2 sprays into both nostrils daily.     HYDROcodone-homatropine 5-1.5 MG/5ML syrup  Commonly known as:  HYDROMET  Take 5 mLs by mouth every 4 (four) hours as needed.     JUICE PLUS FIBRE PO  Take 2 tablets by mouth daily. Reported on 03/10/2016     magic mouthwash w/lidocaine Soln  Take 5 mLs by mouth 4 (four) times daily as needed for mouth pain.     phentermine 37.5 MG tablet  Commonly known as:  ADIPEX-P  Reported on 03/16/2016     predniSONE 10 MG tablet  Commonly known as:  DELTASONE  Take 4 tabs a day for 4 days, then 3 a day for 4 days, then 2 a day for 4 days, then 1 a day for 4 days, then stop     pseudoephedrine 60 MG tablet  Commonly known as:  SUDAFED  Take 60 mg by mouth every 4 (four) hours as needed for congestion. Reported on 03/17/2016     ranitidine 300 MG tablet  Commonly known as:  ZANTAC  Take 1 tablet (300 mg total) by mouth at bedtime.     traMADol 50 MG tablet  Commonly known as:  ULTRAM  Take 1 tablet (50 mg total) by mouth every 8 (eight) hours as needed.     traZODone 50 MG tablet  Commonly known as:  DESYREL  Take 0.5-1 tablets (25-50 mg total) by mouth at bedtime as needed for sleep.        Known medication allergies: Allergies  Allergen  Reactions  .  Cimetidine     REACTION: anaphalactic shock  . Escitalopram Oxalate     Caused ankle swelling   . Mobic [Meloxicam] Swelling    I appreciate the opportunity to take part in this Taetum's care. Please do not hesitate to contact me with questions.  Sincerely,   R. Edgar Frisk, MD

## 2016-03-17 NOTE — Assessment & Plan Note (Signed)
   For now, continue Symbicort 160/4.5 g, 2 inhalations twice a day.  To maximize pulmonary deposition, a spacer has been provided along with instructions for its proper administration with an HFA inhaler.  Continue albuterol every 4-6 hours as needed.

## 2016-03-17 NOTE — Assessment & Plan Note (Signed)
   Start azelastine, 2 sprays per nostril twice a day.  Continue fluticasone nasal spray, spray per nostril twice daily.  Nasal saline lavage (NeilMed) as needed has been recommended along with instructions for proper administration.  A prescription has been provided for [facility] ER (cabinoxamine) 8-16 mg twice daily as needed.

## 2016-03-18 ENCOUNTER — Other Ambulatory Visit: Payer: Self-pay

## 2016-03-18 ENCOUNTER — Telehealth: Payer: Self-pay

## 2016-03-18 DIAGNOSIS — J310 Chronic rhinitis: Secondary | ICD-10-CM

## 2016-03-18 DIAGNOSIS — R053 Chronic cough: Secondary | ICD-10-CM

## 2016-03-18 DIAGNOSIS — R05 Cough: Secondary | ICD-10-CM

## 2016-03-18 MED ORDER — CARBINOXAMINE MALEATE ER 4 MG/5ML PO SUER: 16.0000 mg | Freq: Two times a day (BID) | ORAL | Status: DC

## 2016-03-18 NOTE — Telephone Encounter (Signed)
We have not received a request for her pa. Not on cover my meds.

## 2016-03-18 NOTE — Telephone Encounter (Signed)
Pt was calling to check the status of her prior auth for [facility]

## 2016-03-19 NOTE — Telephone Encounter (Signed)
Spoke with pt. I do not see PA request.  [place] and spoke to [facility] and requested they fax over PA request.  Pt was given Karbinal samples.

## 2016-03-20 ENCOUNTER — Other Ambulatory Visit: Payer: Self-pay | Admitting: Family Medicine

## 2016-03-22 MED ORDER — BENZONATATE 200 MG PO CAPS: 200.0000 mg | ORAL_CAPSULE | Freq: Two times a day (BID) | ORAL | Status: DC | PRN

## 2016-03-22 MED FILL — BENZONATATE 200 MG CAPSULE: 200 | 30 days supply | Qty: 60 | Fill #0

## 2016-03-22 NOTE — Telephone Encounter (Signed)
Last OV 03/16/16 Tramadol last filled 03/16/16 #45 with 0 Tessalon last filled 03/08/16 #20 with 0

## 2016-03-26 MED FILL — CARBINOXAMINE 4 MG/5 ML LIQ: 4 | 19 days supply | Qty: 473 | Fill #0

## 2016-03-31 ENCOUNTER — Other Ambulatory Visit: Payer: Self-pay | Admitting: Family Medicine

## 2016-03-31 MED FILL — ESOMEPRAZOLE MAG DR 40 MG C: 40 | 30 days supply | Qty: 30 | Fill #0

## 2016-03-31 NOTE — Telephone Encounter (Signed)
Medication filled to pharmacy as requested.   

## 2016-04-02 ENCOUNTER — Ambulatory Visit (INDEPENDENT_AMBULATORY_CARE_PROVIDER_SITE_OTHER): Payer: BLUE CROSS/BLUE SHIELD | Admitting: Internal Medicine

## 2016-04-02 ENCOUNTER — Encounter: Payer: Self-pay | Admitting: Internal Medicine

## 2016-04-02 VITALS — BP 126/84 | HR 94 | Ht 64.0 in | Wt 197.0 lb

## 2016-04-02 DIAGNOSIS — R053 Chronic cough: Secondary | ICD-10-CM

## 2016-04-02 DIAGNOSIS — R05 Cough: Secondary | ICD-10-CM | POA: Diagnosis not present

## 2016-04-02 MED ORDER — PREDNISONE 10 MG PO TABS: ORAL_TABLET | ORAL | Status: DC

## 2016-04-02 MED ORDER — FLUTTER DEVI: Status: DC

## 2016-04-02 MED ORDER — ACETAMINOPHEN-CODEINE #3 300-30 MG PO TABS: ORAL_TABLET | ORAL | Status: DC

## 2016-04-02 MED ORDER — TRAMADOL HCL 50 MG PO TABS: 50.0000 mg | ORAL_TABLET | Freq: Three times a day (TID) | ORAL | Status: DC | PRN

## 2016-04-02 MED FILL — predniSONE 10 MG TABS: 10 | 6 days supply | Qty: 14 | Fill #0

## 2016-04-02 MED FILL — ACETAMINOPHEN/COD #3 TABLET: 300-30 | 6 days supply | Qty: 40 | Fill #0

## 2016-04-02 MED FILL — traMADol HCL 50 MG TABS: 50 | 15 days supply | Qty: 45 | Fill #0

## 2016-04-02 NOTE — Patient Instructions (Addendum)
The key to effective treatment for your cough is eliminating the non-stop cycle of cough you're stuck in long enough to let your airway heal completely and then see if there is anything still making you cough once you stop the cough suppression, but this should take no more than 5 days to figure out  Whenever coughing/ cough into the flutter valve to prevent airway trauma  First take delsym two tsp every 12 hours and supplement if needed with  Tylenol #3   up to 2 every 4 hours to suppress the urge to cough at all or even clear your throat. Swallowing water or using ice chips/non mint and menthol containing candies (such as lifesavers or sugarless jolly ranchers) are also effective.  You should rest your voice and avoid activities that you know make you cough.  Once you have eliminated the cough for 3 straight days try reducing the tylenol #3 first,  then the delsym as tolerated.    Prednisone 10 mg take  4 each am x 2 days,   2 each am x 2 days,  1 each am x 2 days and stop (this is to eliminate allergies and inflammation from coughing)  Protonix (pantoprazole) Take 30-60 min before first meal of the day and Pepcid 20 mg one bedtime plus chlorpheniramine 4 mg x 2 at bedtime (both available over the counter)  until cough is completely gone for at least a week without the need for cough suppression  GERD (REFLUX)  is an extremely common cause of respiratory symptoms, many times with no significant heartburn at all.    It can be treated with medication, but also with lifestyle changes including avoidance of late meals, excessive alcohol, smoking cessation, and avoid fatty foods, chocolate, peppermint, colas, red wine, and acidic juices such as orange juice.  NO MINT OR MENTHOL PRODUCTS SO NO COUGH DROPS  USE HARD CANDY INSTEAD (jolley ranchers or Stover's or Lifesavers (all available in sugarless versions) NO OIL BASED VITAMINS - use powdered substitutes.   If better tell your friends, if not  return here in 2 weeks

## 2016-04-02 NOTE — Progress Notes (Signed)
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   04/02/2016 1st [facility] office visit/ Melissa Cohen  maint rx symb 160/saba/astelin Chief Complaint  Patient presents with  . Pulmonary Consult    Referred by Dr. Annye Asa. Pt c/o cough since Feb 2017- non prod and triggered by talking and taking a deep breath. She also c/o DOE with walking up hill. She had been having some pain under right breast- lasted approx 2 wks.   abrupt onset in February 2017 on last day of cruise typical in every way to prev episodes though more severe and no response to rx for rhinitis/asthma rx/3 rounds of prednisone. Dr Starling Manns eval 4/191/7: Spirometry: FVC was 2.82 L and FEV1 was 2.48 L (95% predicted) without significant postbronchodilator improvement. Epicutaneous testing: Negative despite a positive histamine control. Intradermal testing: Negative despite a positive histamine control.neg allergy eval    rec add antihistmine 1 and 1/ continue symbicort 160 >>no response  Does not wake her in am/ starts after stirs x first hour, worse with talking or taking a deep breath,   Assoc with doe x hills  No change with meals or smells   No obvious other patterns in day to day or daytime variabilty or assoc excess/ purulent sputum or mucus plugs  or cp or chest tightness, subjective wheeze overt sinus or hb symptoms. No unusual exp hx or h/o childhood pna/ asthma or knowledge of  premature birth.  Sleeping ok without nocturnal  or early am exacerbation  of respiratory  c/o's or need for noct saba. Also denies any obvious fluctuation of symptoms with weather or environmental changes or other aggravating or alleviating factors except as outlined above   Current Medications, Allergies, Complete Past Medical History, Past Surgical History, Family History, and Social History were reviewed in [employer] record.               Review of Systems  Constitutional: Negative for fever, chills and unexpected weight change.  HENT: Positive for postnasal drip and voice change. Negative for congestion, dental problem, ear pain, nosebleeds, rhinorrhea, sinus pressure, sneezing, sore throat and trouble swallowing.   Eyes: Negative for visual disturbance.  Respiratory: Positive for cough and shortness of breath. Negative for choking.   Cardiovascular: Negative for chest pain and leg swelling.  Gastrointestinal: Negative for vomiting, abdominal pain and diarrhea.  Genitourinary: Negative for difficulty urinating.  Musculoskeletal: Negative for arthralgias.  Skin: Negative for rash.  Neurological: Negative for tremors, syncope and headaches.  Hematological: Does not bruise/bleed easily.       Objective:   Physical Exam  amb wf with  harsh barking quality cough/ pseudowheeze eliminated with plm   Wt Readings from Last 3 Encounters:  04/02/16 197 lb (89.359 kg)  03/17/16 200 lb 4 oz (90.833 kg)  03/16/16 200 lb 4 oz (90.833 kg)    Vital signs reviewed  HEENT: nl dentition, turbinates, and oropharynx. Nl external ear canals without cough  reflex   NECK :  without JVD/Nodes/TM/ nl carotid upstrokes bilaterally   LUNGS: no acc muscle use,  Nl contour chest which is clear to A and P bilaterally without cough on insp or exp maneuvers   CV:  RRR  no s3 or murmur or increase in P2, no edema   ABD:  soft and nontender with nl inspiratory excursion in  the supine position. No bruits or organomegaly, bowel sounds nl  MS:  Nl gait/ ext warm without deformities, calf tenderness, cyanosis or clubbing No obvious joint restrictions   SKIN: warm and dry without lesions    NEURO:  alert, approp, nl sensorium with  no motor deficits     I personally reviewed images and agree with radiology impression as follows:  CXR:  03/10/16  No active cardiopulmonary disease.       Assessment & Plan:

## 2016-04-03 NOTE — Assessment & Plan Note (Addendum)
The most common causes of chronic cough in immunocompetent adults include the following: upper airway cough syndrome (UACS), previously referred to as postnasal drip syndrome (PNDS), which is caused by variety of rhinosinus conditions; (2) asthma; (3) GERD; (4) chronic bronchitis from cigarette smoking or other inhaled environmental irritants; (5) nonasthmatic eosinophilic bronchitis; and (6) bronchiectasis.   These conditions, singly or in combination, have accounted for up to 94% of the causes of chronic cough in prospective studies.   Other conditions have constituted no >6% of the causes in prospective studies These have included bronchogenic carcinoma, chronic interstitial pneumonia, sarcoidosis, left ventricular failure, ACEI-induced cough, and aspiration from a condition associated with pharyngeal dysfunction.    Chronic cough is often simultaneously caused by more than one condition. A single cause has been found from 38 to 82% of the time, multiple causes from 18 to 62%. Multiply caused cough has been the result of three diseases up to 42% of the time.       Of the three most common causes of chronic cough, only one (GERD)  can actually cause the other two (asthma and post nasal drip syndrome)  and perpetuate the cylce of cough inducing airway trauma, inflammation, heightened sensitivity to reflux which is prompted by the cough itself via a cyclical mechanism.    This may partially respond to steroids and look like asthma and post nasal drainage but never erradicated completely unless the cough and the secondary reflux are eliminated, preferably both at the same time.  While not intuitively obvious, many patients with chronic low grade reflux do not cough until there is a secondary insult that disturbs the protective epithelial barrier and exposes sensitive nerve endings.  This can be viral or direct physical injury such as with an endotracheal tube.   The point is that once this occurs, it is  difficult to eliminate using anything but a maximally effective acid suppression regimen at least in the short run, accompanied by an appropriate diet to address non acid GERD.   rec max rx directed at GERD/ cyclical cough/pnds (1st gen h1 per guidelines), stop all inhaled agents (which aren't working and may aggravate UACS which is part of the cyclical cough syndrome, and regroup in 2 weeks if not better  Total time devoted to counseling  = 35/56mreview case with pt/ discussion of options/alternatives/ personally creating in presence of pt  then going over specific  Instructions directly with the pt including how to use all of the meds but in particular covering each new medication in detail (see avs)

## 2016-04-08 DIAGNOSIS — Z6833 Body mass index (BMI) 33.0-33.9, adult: Secondary | ICD-10-CM | POA: Diagnosis not present

## 2016-04-08 DIAGNOSIS — E669 Obesity, unspecified: Secondary | ICD-10-CM | POA: Diagnosis not present

## 2016-04-19 ENCOUNTER — Encounter: Payer: Self-pay | Admitting: Family Medicine

## 2016-04-19 ENCOUNTER — Ambulatory Visit: Payer: BLUE CROSS/BLUE SHIELD | Admitting: Internal Medicine

## 2016-04-19 ENCOUNTER — Telehealth: Payer: Self-pay | Admitting: Gastroenterology

## 2016-04-19 MED ORDER — TRAMADOL HCL 50 MG PO TABS: 50.0000 mg | ORAL_TABLET | Freq: Three times a day (TID) | ORAL | Status: DC | PRN

## 2016-04-19 MED FILL — traMADol HCL 50 MG TABS: 50 | 15 days supply | Qty: 45 | Fill #0

## 2016-04-19 NOTE — Telephone Encounter (Signed)
Patient with rectal bleeding.  She has a history of UC and has stopped her meds for several years.  She will come in and see Nicoletta Ba PA on 04/22/16 at 10:30

## 2016-04-22 ENCOUNTER — Ambulatory Visit (INDEPENDENT_AMBULATORY_CARE_PROVIDER_SITE_OTHER): Payer: BLUE CROSS/BLUE SHIELD | Admitting: Physician Assistant

## 2016-04-22 ENCOUNTER — Other Ambulatory Visit (INDEPENDENT_AMBULATORY_CARE_PROVIDER_SITE_OTHER): Payer: BLUE CROSS/BLUE SHIELD

## 2016-04-22 ENCOUNTER — Encounter: Payer: Self-pay | Admitting: Physician Assistant

## 2016-04-22 VITALS — BP 124/70 | Ht 64.0 in | Wt 192.2 lb

## 2016-04-22 DIAGNOSIS — K625 Hemorrhage of anus and rectum: Secondary | ICD-10-CM

## 2016-04-22 DIAGNOSIS — K51911 Ulcerative colitis, unspecified with rectal bleeding: Secondary | ICD-10-CM

## 2016-04-22 LAB — CBC WITH DIFFERENTIAL/PLATELET
BASOS ABS: 0 10*3/uL (ref 0.0–0.1)
BASOS PCT: 0.6 % (ref 0.0–3.0)
EOS ABS: 0.2 10*3/uL (ref 0.0–0.7)
Eosinophils Relative: 2.9 % (ref 0.0–5.0)
HEMATOCRIT: 37.5 % (ref 36.0–46.0)
HEMOGLOBIN: 12.7 g/dL (ref 12.0–15.0)
LYMPHS PCT: 37.2 % (ref 12.0–46.0)
Lymphs Abs: 2.6 10*3/uL (ref 0.7–4.0)
MCHC: 33.9 g/dL (ref 30.0–36.0)
MCV: 86.4 fl (ref 78.0–100.0)
MONOS PCT: 7.5 % (ref 3.0–12.0)
Monocytes Absolute: 0.5 10*3/uL (ref 0.1–1.0)
NEUTROS ABS: 3.7 10*3/uL (ref 1.4–7.7)
Neutrophils Relative %: 51.8 % (ref 43.0–77.0)
Platelets: 355 10*3/uL (ref 150.0–400.0)
RBC: 4.35 Mil/uL (ref 3.87–5.11)
RDW: 14.1 % (ref 11.5–15.5)
WBC: 7.1 10*3/uL (ref 4.0–10.5)

## 2016-04-22 LAB — COMPREHENSIVE METABOLIC PANEL
ALBUMIN: 4.3 g/dL (ref 3.5–5.2)
ALK PHOS: 111 U/L (ref 39–117)
ALT: 17 U/L (ref 0–35)
AST: 20 U/L (ref 0–37)
BILIRUBIN TOTAL: 0.4 mg/dL (ref 0.2–1.2)
BUN: 11 mg/dL (ref 6–23)
CALCIUM: 9.4 mg/dL (ref 8.4–10.5)
CO2: 30 mEq/L (ref 19–32)
CREATININE: 0.8 mg/dL (ref 0.40–1.20)
Chloride: 104 mEq/L (ref 96–112)
GFR: 80.11 mL/min (ref >60.00)
Glucose, Bld: 92 mg/dL (ref 70–99)
Potassium: 3.8 mEq/L (ref 3.5–5.1)
Sodium: 138 mEq/L (ref 135–145)
Total Protein: 7.2 g/dL (ref 6.0–8.3)

## 2016-04-22 LAB — HIGH SENSITIVITY CRP: CRP HIGH SENSITIVITY: 11.12 mg/L — AB (ref 0.000–5.000)

## 2016-04-22 MED ORDER — MESALAMINE 1.2 G PO TBEC: DELAYED_RELEASE_TABLET | ORAL | Status: DC

## 2016-04-22 MED ORDER — NA SULFATE-K SULFATE-MG SULF 17.5-3.13-1.6 GM/177ML PO SOLN: 1.0000 | Freq: Once | ORAL | Status: DC

## 2016-04-22 NOTE — Patient Instructions (Signed)
Please go to the basement level to have your labs drawn.  We sent a prescription for the Lialda and the colonoscopy prep.   Hold the Phentermine 10 days prior to 6-30. Stop it on 05-18-2016.   You have been scheduled for a colonoscopy. Please follow written instructions given to you at your visit today.  Please pick up your prep supplies at the pharmacy within the next 1-3 days. If you use inhalers (even only as needed), please bring them with you on the day of your procedure. Your physician has requested that you go to www.[email or web address] and enter the access code given to you at your visit today. This web site gives a general overview about your procedure. However, you should still follow specific instructions given to you by our office regarding your preparation for the procedure.

## 2016-04-22 NOTE — Progress Notes (Signed)
Patient ID: Melissa Cohen, female   DOB: Feb 14, 1966, 50 y.o.   MRN: [id number]   Subjective:    Patient ID: Melissa Cohen, female    DOB: November 24, 1966, 50 y.o.   MRN: [id number]  HPI  Melissa is a pleasant 50 year old white female known to Melissa Cohen. She has long history of ulcerative colitis which was diagnosed when she was a teenager. She has had very minimal symptoms over the past 10 years. She's not been seen in our office since 2013 when she had colonoscopy which showed universal ulcerative colitis and multiple pseudopolyps. He showed minimally active chronic colitis from both the left and right colon. She was on mesalamine therapy at that time. She says she has stopped and started medicines over the past several years because she really hasn't had any symptoms and didn't feel any different when she was on the medications. She has not been taking any medicine for her colitis since 2013. She came in today because she started noticing blood in her stool about 6 weeks ago. She is worried it's her. He says she's having 2-3 bowel movements per day somewhat more frequent than normal for her and some mild lower abdominal cramping which seems to ease off with a bowel movement. She describes the blood as being maroonish in color and mixed in with the stool. Family history pertinent for father with appendiceal cancer from which he died. Patient has been taking phentermine for weight loss.  Review of Systems Pertinent positive and negative review of systems were noted in the above HPI section.  All other review of systems was otherwise negative.  Outpatient Encounter Prescriptions as of 04/22/2016  Medication Sig  . acetaminophen-codeine (TYLENOL #3) 300-30 MG tablet One every 4 hours as needed for cough  . albuterol (PROVENTIL HFA;VENTOLIN HFA) 108 (90 Base) MCG/ACT inhaler Inhale 2 puffs into the lungs every 6 (six) hours as needed for wheezing or shortness of breath.  . esomeprazole (NEXIUM) 40 MG  capsule TAKE 1 CAPSULE BY MOUTH ONCE DAILY  . phentermine (ADIPEX-P) 37.5 MG tablet Reported on 03/16/2016  . ranitidine (ZANTAC) 300 MG tablet Take 1 tablet (300 mg total) by mouth at bedtime.  Marland Kitchen Respiratory Therapy Supplies (FLUTTER) DEVI Use as directed  . traMADol (ULTRAM) 50 MG tablet Take 1 tablet (50 mg total) by mouth every 8 (eight) hours as needed.  . traZODone (DESYREL) 50 MG tablet Take 0.5-1 tablets (25-50 mg total) by mouth at bedtime as needed for sleep.  . mesalamine (LIALDA) 1.2 g EC tablet Take 2 tablets twice daily.  . Na Sulfate-K Sulfate-Mg Sulf 17.5-3.13-1.6 GM/180ML SOLN Take 1 kit by mouth once.  . [DISCONTINUED] predniSONE (DELTASONE) 10 MG tablet Take  4 each am x 2 days,   2 each am x 2 days,  1 each am x 2 days and stop (Patient not taking: Reported on 04/22/2016)  . [DISCONTINUED] pseudoephedrine (SUDAFED) 60 MG tablet Take 60 mg by mouth every 4 (four) hours as needed for congestion. Reported on 04/22/2016   No facility-administered encounter medications on file as of 04/22/2016.   Allergies  Allergen Reactions  . Cimetidine     REACTION: anaphalactic shock  . Escitalopram Oxalate     Caused ankle swelling   . Mobic [Meloxicam] Swelling   Patient Active Problem List   Diagnosis Date Noted  . Persistent cough 03/17/2016  . Chronic rhinitis 03/17/2016  . Acute pharyngitis 03/06/2016  . Post viral RAD (reactive airway disease) 02/06/2016  . Urinary  frequency 12/18/2015  . Menopausal syndrome (hot flushes) 08/23/2014  . Screening for malignant neoplasm of the cervix 02/25/2014  . Bronchospasm with bronchitis 05/24/2013  . URI (upper respiratory infection) 05/22/2013  . General medical examination 12/07/2011  . GERD (gastroesophageal reflux disease) 10/08/2011  . Anxiety and depression 10/08/2011  . ABSCESS, FINGER 08/25/2011  . SACROILIAC JOINT DYSFUNCTION 04/12/2011  . TROCHANTERIC BURSITIS, BILATERAL 04/12/2011  . NEVUS, ATYPICAL 07/14/2010  . VAGINITIS  07/14/2010  . Cervical radiculopathy 07/14/2010  . GERD 12/31/2009  . CHEST PAIN, PLEURITIC 11/18/2009  . DIZZINESS 11/10/2009  . CHEST PAIN 11/10/2009  . CARCINOMA, BASAL CELL 03/14/2008  . VITAMIN D DEFICIENCY 03/06/2008  . HYPERTRIGLYCERIDEMIA 03/06/2008  . FATIGUE 03/04/2008  . ULCERATIVE COLITIS 02/09/2007  . INSOMNIA 02/09/2007   Social History   Social History  . Marital Status: Divorced    Spouse Name: N/A  . Number of Children: 3  . Years of Education: N/A   Occupational History  . Nursing secretary  [place] History Main Topics  . Smoking status: Former Smoker -- 0.50 packs/day for 25 years    Types: Cigarettes    Quit date: 02/27/2006  . Smokeless tobacco: Never Used  . Alcohol Use: 1.8 oz/week    3 Glasses of wine per week     Comment: every other weekend socially  . Drug Use: No  . Sexual Activity: Not on file   Other Topics Concern  . Not on file   Social History Narrative    Melissa Cohen's family history includes Barrett's esophagus in her father; Cancer in her father; Colon cancer (age of onset: 4) in her father; Crohn's disease in her brother; Diabetes in her sister; Heart attack in her maternal grandfather; Heart disease in her maternal grandfather; Hyperlipidemia in her father; Prostate cancer in her father; Ulcerative colitis in her maternal grandmother and mother. There is no history of Esophageal cancer, Rectal cancer, Stomach cancer, Allergic rhinitis, Angioedema, Asthma, Atopy, Eczema, or Immunodeficiency.      Objective:    Filed Vitals:   04/22/16 1042  BP: 124/70    Physical Exam well-developed white female in no acute distress, pleasant blood pressure 124/70 height 5 foot 4 weight 192, BMI 32. HEENT; nontraumatic normocephalic EOMI PERRLA sclera anicteric, Cardiovascular; regular rate and rhythm with S1-S2 no murmur or gallop, Pulmonary; clear bilaterally, Abdomen; soft basically nontender there is no palpable mass or  hepatosplenomegaly, bowel sounds are present, Rectal ;exam not done, Extremities; no clubbing cyanosis or edema skin warm and dry, Neuropsych; mood and affect appropriate     Assessment & Cohen:   #67 50 year old female with long history of universal ulcerative colitis diagnosed in her teens and symptomatically quiet sent for past 10 years or so no active disease on colonoscopy. Patient has not been on any medicine over the past 4 years and has not been seen in our office since 2013.Marland Kitchen Patient presents now with 6 week history of bright red blood mixed with her bowel movements mild lower abdominal cramping and mild increase in frequency of bowel movements. I suspect she is having a colitis exacerbation, we'll also need to rule out occult lesion #2 GERD  Cohen; check CBC with differential, and CRP Start Lialda 2.4 g by mouth twice a day. We discussed the benefit to treatment and then maintenance therapy for prevention of exacerbations of ulcerative colitis. Patient has been drinking Sheila Oats and asks if this is beneficial, she will continue Schedule for colonoscopy with Melissa Cohen.  Procedure discussed in detail with the patient and she is agreeable to proceed. She will need to hold phentermine for 10 days prior to colonoscopy.   Derryl Uher S Balraj Brayfield PA-C 04/22/2016   Cc: Midge Minium, MD

## 2016-04-26 NOTE — Progress Notes (Signed)
Reviewed and agree with management plan.  Fortino Haag T.  Shellhammer, MD FACG 

## 2016-04-28 ENCOUNTER — Ambulatory Visit: Payer: BLUE CROSS/BLUE SHIELD | Admitting: Allergy and Immunology

## 2016-04-29 MED FILL — ESOMEPRAZOLE MAG DR 40 MG C: 40 | 30 days supply | Qty: 30 | Fill #1

## 2016-04-29 MED FILL — raNITIdine HCL 300 MG TABS: 300 | 30 days supply | Qty: 30 | Fill #1

## 2016-05-04 ENCOUNTER — Encounter: Payer: Self-pay | Admitting: Gastroenterology

## 2016-05-04 ENCOUNTER — Telehealth: Payer: Self-pay | Admitting: Gastroenterology

## 2016-05-04 MED ORDER — PREDNISONE 10 MG PO TABS: ORAL_TABLET | ORAL | Status: DC

## 2016-05-04 NOTE — Telephone Encounter (Signed)
No earlier appts with Fuller Plan for a colonoscopy.  Do you want to prescribe any other meds until procedure on 6/30?

## 2016-05-04 NOTE — Telephone Encounter (Signed)
Patient notified to contact her insurance company and find out what mesalamine is covered at her best tier.  She will call back once she hears from them

## 2016-05-04 NOTE — Telephone Encounter (Signed)
Patient notified of recommendations She also wanted Amy to know that the lialda worsened her symptoms.  Caused more bleeding and diarrhea.  It is also not covered by her insurance She is advised that I will continue to look for an earlier appt for her.  She is in [place] presently.  She is also advised that we will call her with a prep closer to her procedure date once we get some in from the drug rep.

## 2016-05-04 NOTE — Telephone Encounter (Signed)
Can we try to find out if her insurance will cover any other mesalamine ,like Asacol?

## 2016-05-04 NOTE — Telephone Encounter (Signed)
Lets start prednisone 30 mg po qam x 2 weeks then decrease to 20 mg po daily x 2 weeks, then 15 mg po daily x 2 weeks ,then 10 mg po daily x 2 weeks then stop- that will get her through until post colonoscopy and Dr will decide on need to continue steroids at time of colonoscopy

## 2016-05-04 NOTE — Telephone Encounter (Signed)
Delzicol 400 mg

## 2016-05-04 NOTE — Telephone Encounter (Signed)
Message     The pharmacy here in [place] is:    [facility]     [phone]    They said that they have that in stock.     Thank you.     Delane Ginger

## 2016-05-05 ENCOUNTER — Telehealth: Payer: Self-pay | Admitting: *Deleted

## 2016-05-05 ENCOUNTER — Telehealth: Payer: Self-pay | Admitting: Gastroenterology

## 2016-05-05 MED ORDER — MESALAMINE 400 MG PO CPDR: 800.0000 mg | DELAYED_RELEASE_CAPSULE | Freq: Three times a day (TID) | ORAL | Status: DC

## 2016-05-05 NOTE — Telephone Encounter (Signed)
Patient notified and rx sent to requested pharmacy in [place]

## 2016-05-05 NOTE — Telephone Encounter (Signed)
Patient reports up to 10 bloody bowel movements each day. She contacted [facility]. Her understanding is if we fax a stat order for a colonoscopy, they will do it within 5 days. She does not want to transfer her care, just get the colonoscopy. How would you like to proceed?

## 2016-05-05 NOTE — Telephone Encounter (Signed)
Hold phentermine now Stool for GI pathogen panel now Continue Lialda  Begin Prednisone 60 mg qd for 5d, decrease by 10 mg every 5d, and hold at 30 mg qd Move up colonoscopy and if I don't have availability we will ask another MD in our group to perform her colonoscopy

## 2016-05-05 NOTE — Telephone Encounter (Signed)
Ok- lets start her on Delzicol 400 mg, 2 tabs tid

## 2016-05-05 NOTE — Telephone Encounter (Signed)
Patient last saw Amy

## 2016-05-05 NOTE — Telephone Encounter (Signed)
Did prior authorizaion for Lialda 1.2 grams.  [employer] did fax to me the denial.  They wanted her to try Delzicol.  Patient advised we would try to send this.  See note from Barb Merino RN/ 05-04-2016.

## 2016-05-06 ENCOUNTER — Encounter: Payer: Self-pay | Admitting: Gastroenterology

## 2016-05-06 ENCOUNTER — Other Ambulatory Visit: Payer: Self-pay

## 2016-05-06 MED ORDER — PREDNISONE 10 MG PO TABS: ORAL_TABLET | ORAL | Status: DC

## 2016-05-06 NOTE — Telephone Encounter (Signed)
Since she is off Phentermine we can do the colonoscopy very soon. As she is in [place] now thru 6/12 we can schedule for a date when she returns. Will ask Dr. Loletha Carrow if he will perform colonoscopy for her next week since I away all next week. Please reassure her that Prednisone is very effective for UC flares. Stay on Delzicol.

## 2016-05-06 NOTE — Telephone Encounter (Signed)
No answer. Left a voicemail with brief information on the plan. Advised also she will receive a detailed email.

## 2016-05-06 NOTE — Telephone Encounter (Signed)
Spoke with this somewhat irascible patient. She states she has not taken Phentermine in 3 weeks. Expresses understanding not to restart Phentermine for now. She states she is on Delzicol, not Lialda, but she plans to continue it. Expresses her doubts about the benefits of taking the Prednisone, but agrees to follow the new instructions. She cannot submit a stool sample until 05/10/16 at the earliest because she is presently in [place]. She states she wants her colonoscopy done "stat". You do not have any procedure time or office time next week. Danis has some [facility] time next week.

## 2016-05-07 ENCOUNTER — Other Ambulatory Visit: Payer: Self-pay

## 2016-05-10 ENCOUNTER — Other Ambulatory Visit: Payer: BLUE CROSS/BLUE SHIELD

## 2016-05-10 ENCOUNTER — Other Ambulatory Visit: Payer: Self-pay

## 2016-05-10 ENCOUNTER — Telehealth: Payer: Self-pay | Admitting: Gastroenterology

## 2016-05-10 ENCOUNTER — Ambulatory Visit (AMBULATORY_SURGERY_CENTER): Payer: Self-pay | Admitting: *Deleted

## 2016-05-10 VITALS — Ht 64.0 in | Wt 193.0 lb

## 2016-05-10 DIAGNOSIS — K625 Hemorrhage of anus and rectum: Secondary | ICD-10-CM

## 2016-05-10 LAB — GASTROINTESTINAL PATHOGEN PANEL PCR
C. difficile Tox A/B, PCR: NOT DETECTED
CAMPYLOBACTER, PCR: NOT DETECTED
CRYPTOSPORIDIUM, PCR: NOT DETECTED
E COLI (ETEC) LT/ST, PCR: NOT DETECTED
E COLI 0157, PCR: NOT DETECTED
E coli (STEC) stx1/stx2, PCR: NOT DETECTED
GIARDIA LAMBLIA, PCR: NOT DETECTED
NOROVIRUS, PCR: NOT DETECTED
Rotavirus A, PCR: NOT DETECTED
SHIGELLA, PCR: NOT DETECTED
Salmonella, PCR: NOT DETECTED

## 2016-05-10 MED ORDER — NA SULFATE-K SULFATE-MG SULF 17.5-3.13-1.6 GM/177ML PO SOLN: 1.0000 | Freq: Once | ORAL | Status: DC

## 2016-05-10 NOTE — Progress Notes (Signed)
No egg or soy allergy known to patient  No issues with past sedation with any surgeries  or procedures, no intubation problems - has had propofol with no issues  No diet pills per patient- off phentermine at this time  No home 02 use per patient  No blood thinners per patient  Pt denies issues with constipation

## 2016-05-10 NOTE — Telephone Encounter (Signed)
Orders were placed previously and specimens are pending

## 2016-05-11 ENCOUNTER — Telehealth: Payer: Self-pay | Admitting: Gastroenterology

## 2016-05-11 DIAGNOSIS — K625 Hemorrhage of anus and rectum: Secondary | ICD-10-CM

## 2016-05-11 NOTE — Telephone Encounter (Signed)
Sorry to hear she is not feeling well. Stay on 60 mg daily of prednisone rather than decreasing to 50 mg Have her stop in the lab tomorrow AM for a hemoglobin.  Drink plenty of fluids. She should take 2 imodium now and another 2 at bedtime tonight.  Do not take any imodium or other antidiarrheal meds tomorrow because it may affect how well the bowel prep works.

## 2016-05-11 NOTE — Telephone Encounter (Signed)
Patient reports that she is having continued rectal bleeding and diarrhea.  Multiple episodes a day.  She reports that she was on 60 mg of prednisone with no change.  Per orders she did decrease to 50 mg of prednisone today.  She wants to know what else can be done until colon on Thursday with Dr. Loletha Carrow.  She does not want to go to the ED.  Stool studies were negative.  She reports 8 episodes of bleeding this am already.  She denies passing blood independent of stool.  Nicoletta Ba PA please advise

## 2016-05-11 NOTE — Telephone Encounter (Signed)
Patient notified of recommendaitons She reports that she is having more bleeding.  She reports very little stool.  "I get lower cramping then blood and mucus with very little stool".  She is advised that if she has an increase in bleeding she should go to the ED for eval.  She does not feel she needs the ED.  She will follow recommendations. She will come for labs in the am.

## 2016-05-11 NOTE — Telephone Encounter (Signed)
Dr. Loletha Carrow, she is scheduled for a colonoscopy with you on Thursday.  Please advise if you have any additional recommendations until procedure on Thursday

## 2016-05-12 ENCOUNTER — Encounter: Payer: Self-pay | Admitting: Gastroenterology

## 2016-05-12 ENCOUNTER — Other Ambulatory Visit (INDEPENDENT_AMBULATORY_CARE_PROVIDER_SITE_OTHER): Payer: BLUE CROSS/BLUE SHIELD

## 2016-05-12 DIAGNOSIS — K625 Hemorrhage of anus and rectum: Secondary | ICD-10-CM

## 2016-05-12 LAB — HEMOGLOBIN: HEMOGLOBIN: 12 g/dL (ref 12.0–15.0)

## 2016-05-12 NOTE — Telephone Encounter (Signed)
Plan noted

## 2016-05-13 ENCOUNTER — Ambulatory Visit (AMBULATORY_SURGERY_CENTER): Payer: BLUE CROSS/BLUE SHIELD | Admitting: Gastroenterology

## 2016-05-13 ENCOUNTER — Encounter: Payer: Self-pay | Admitting: Gastroenterology

## 2016-05-13 ENCOUNTER — Telehealth: Payer: Self-pay | Admitting: Gastroenterology

## 2016-05-13 VITALS — BP 104/64 | HR 90 | Temp 97.1°F | Resp 14 | Ht 64.0 in | Wt 193.0 lb

## 2016-05-13 DIAGNOSIS — R197 Diarrhea, unspecified: Secondary | ICD-10-CM | POA: Diagnosis not present

## 2016-05-13 DIAGNOSIS — K529 Noninfective gastroenteritis and colitis, unspecified: Secondary | ICD-10-CM | POA: Diagnosis not present

## 2016-05-13 DIAGNOSIS — K51519 Left sided colitis with unspecified complications: Secondary | ICD-10-CM | POA: Diagnosis not present

## 2016-05-13 DIAGNOSIS — K625 Hemorrhage of anus and rectum: Secondary | ICD-10-CM

## 2016-05-13 MED ORDER — DIPHENOXYLATE-ATROPINE 2.5-0.025 MG PO TABS: 2.0000 | ORAL_TABLET | Freq: Three times a day (TID) | ORAL | Status: DC | PRN

## 2016-05-13 MED ORDER — HYDROCORTISONE ACETATE 25 MG RE SUPP: 25.0000 mg | Freq: Two times a day (BID) | RECTAL | Status: DC

## 2016-05-13 MED ORDER — SODIUM CHLORIDE 0.9 % IV SOLN: 500.0000 mL | INTRAVENOUS | Status: DC

## 2016-05-13 MED FILL — DIPHENOXYLATE-ATROPINE TAB: 2.5-0.025 | 10 days supply | Qty: 60 | Fill #0

## 2016-05-13 NOTE — Op Note (Signed)
[place] Patient Name: Melissa Cohen Procedure Date: 05/13/2016 9:25 AM MRN: [id number] Endoscopist: Mallie Mussel L. Loletha Carrow , MD Age: 50 Referring MD:  Date of Birth: [birth year] Gender: Female Procedure:                Colonoscopy Indications:              Lower abdominal pain, Chronic diarrhea,                            Hematochezia, Ulcerative colitis Medicines:                Monitored Anesthesia Care Procedure:                Pre-Anesthesia Assessment:                           - Prior to the procedure, a History and Physical                            was performed, and patient medications and                            allergies were reviewed. The patient's tolerance of                            previous anesthesia was also reviewed. The risks                            and benefits of the procedure and the sedation                            options and risks were discussed with the patient.                            All questions were answered, and informed consent                            was obtained. Prior Anticoagulants: The patient has                            taken no previous anticoagulant or antiplatelet                            agents. ASA Grade Assessment: II - A patient with                            mild systemic disease. After reviewing the risks                            and benefits, the patient was deemed in                            satisfactory condition to undergo the procedure.  After obtaining informed consent, the colonoscope                            was passed under direct vision. Throughout the                            procedure, the patient's blood pressure, pulse, and                            oxygen saturations were monitored continuously. The                            Model CF-HQ190L [id number]) scope was introduced                            through the anus and advanced to the the terminal                       ileum. The colonoscopy was performed without                            difficulty. The patient tolerated the procedure                            well. The quality of the bowel preparation was                            good. The terminal ileum, ileocecal valve,                            appendiceal orifice, and rectum were photographed.                            The bowel preparation used was Miralax. Scope In: 9:30:29 AM Scope Out: 9:39:56 AM Scope Withdrawal Time: 0 hours 6 minutes 52 seconds  Total Procedure Duration: 0 hours 9 minutes 27 seconds  Findings:                 The perianal and digital rectal examinations were                            normal.                           The terminal ileum contained a few scattered                            non-bleeding erosions with normal intervening                            mucosa.                           Inflammation characterized by friability,  pseudopolyps and deep ulcerations was found in a                            continuous and circumferential pattern from the                            anus to the splenic flexure. No sites were spared.                            This was severe. Biopsies were taken with a cold                            forceps for histology.                           From the cecum to the proximal transverse colon                            there was mucosal scarring consitent with                            previously-active colitis in this area. Complications:            No immediate complications. Estimated Blood Loss:     Estimated blood loss was minimal. Impression:               - A few erosions in the terminal ileum.                           - Left-sided colitis. Inflammation was found from                            the anus to the splenic flexure. This was severe.                            Biopsied. Recommendation:           - Patient has a  contact number available for                            emergencies. The signs and symptoms of potential                            delayed complications were discussed with the                            patient. Return to normal activities tomorrow.                            Written discharge instructions were provided to the                            patient.                           - Resume previous  diet.                           - Continue present medications, including                            prednisone 60 mg once daily.                           - Await pathology results.                           - Repeat colonoscopy is recommended. The                            colonoscopy date will be determined after pathology                            results from today's exam become available for                            review.                           - Lomotil 1-2 tablets three time daily as needed                            for diarrhea.                           Continue prednisone 60 mg once daily                           Begin hydrocortisone 25 mg suppository twice daily                            to decrease tenesmus.                           Follow-up with Dr. Fuller Plan will be arranged after                            biopsy results next week. Atari Novick L. Loletha Carrow, MD 05/13/2016 9:50:59 AM This report has been signed electronically.

## 2016-05-13 NOTE — Progress Notes (Signed)
Called to room to assist during endoscopic procedure.  Patient ID and intended procedure confirmed with present staff. Received instructions for my participation in the procedure from the performing physician.  

## 2016-05-13 NOTE — Telephone Encounter (Signed)
Hydrocortisone suppositories not covered with her insurance. RX would be $200 Please replace with hydrocortisone cream or hydrocortisone enema.

## 2016-05-13 NOTE — Telephone Encounter (Signed)
RX resent to [facility] as requested by patient. She also requested the other pharmacies to be removed from her file. This was completed as requested.

## 2016-05-13 NOTE — Progress Notes (Signed)
Patient awakening,vss,report to rn 

## 2016-05-13 NOTE — Patient Instructions (Signed)
Discharge instructions given. Biopsies taken. Prescriptions given and called in to pharmacy. Resume previous medications. YOU HAD AN ENDOSCOPIC PROCEDURE TODAY AT [facility]:   Refer to the procedure report that was given to you for any specific questions about what was found during the examination.  If the procedure report does not answer your questions, please call your gastroenterologist to clarify.  If you requested that your care partner not be given the details of your procedure findings, then the procedure report has been included in a sealed envelope for you to review at your convenience later.  YOU SHOULD EXPECT: Some feelings of bloating in the abdomen. Passage of more gas than usual.  Walking can help get rid of the air that was put into your GI tract during the procedure and reduce the bloating. If you had a lower endoscopy (such as a colonoscopy or flexible sigmoidoscopy) you may notice spotting of blood in your stool or on the toilet paper. If you underwent a bowel prep for your procedure, you may not have a normal bowel movement for a few days.  Please Note:  You might notice some irritation and congestion in your nose or some drainage.  This is from the oxygen used during your procedure.  There is no need for concern and it should clear up in a day or so.  SYMPTOMS TO REPORT IMMEDIATELY:   Following lower endoscopy (colonoscopy or flexible sigmoidoscopy):  Excessive amounts of blood in the stool  Significant tenderness or worsening of abdominal pains  Swelling of the abdomen that is new, acute  Fever of 100F or higher   For urgent or emergent issues, a gastroenterologist can be reached at any hour by calling [phone].   DIET: Your first meal following the procedure should be a small meal and then it is ok to progress to your normal diet. Heavy or fried foods are harder to digest and may make you feel nauseous or bloated.  Likewise, meals heavy in dairy  and vegetables can increase bloating.  Drink plenty of fluids but you should avoid alcoholic beverages for 24 hours.  ACTIVITY:  You should plan to take it easy for the rest of today and you should NOT DRIVE or use heavy machinery until tomorrow (because of the sedation medicines used during the test).    FOLLOW UP: Our staff will call the number listed on your records the next business day following your procedure to check on you and address any questions or concerns that you may have regarding the information given to you following your procedure. If we do not reach you, we will leave a message.  However, if you are feeling well and you are not experiencing any problems, there is no need to return our call.  We will assume that you have returned to your regular daily activities without incident.  If any biopsies were taken you will be contacted by phone or by letter within the next 1-3 weeks.  Please call us at [phone] if you have not heard about the biopsies in 3 weeks.    SIGNATURES/CONFIDENTIALITY: You and/or your care partner have signed paperwork which will be entered into your electronic medical record.  These signatures attest to the fact that that the information above on your After Visit Summary has been reviewed and is understood.  Full responsibility of the confidentiality of this discharge information lies with you and/or your care-partner.

## 2016-05-13 NOTE — Progress Notes (Signed)
Patient was taken out to the car, and stated that she forgot her "clothes."  Then her "bag."  I asked her if her driver had her clothes and items.  She pointed to her clothes while in the car.  I asked the other transporter who was with me if her bag had been left.  Then the patient motoined to her clothes on her.   Then her driver proceeded to cal me a b@#$%.  I did nothing wrong.  Johnsie Kindred CMA was with me. Ernestine Conrad, RN

## 2016-05-14 ENCOUNTER — Telehealth: Payer: Self-pay

## 2016-05-14 MED ORDER — HYDROCORTISONE 100 MG/60ML RE ENEM: 1.0000 | ENEMA | Freq: Every day | RECTAL | Status: DC

## 2016-05-14 MED FILL — HYDROCORTISONE 100 MG/60 ML: 100 | 14 days supply | Qty: 840 | Fill #0

## 2016-05-14 NOTE — Telephone Encounter (Signed)
Attempt post procedure follow up call, no answer, left voice mail message.

## 2016-05-14 NOTE — Telephone Encounter (Signed)
Per Dr Loletha Carrow Rx Cortenema one at bedtime for 14 nights. Rx sent to [facility].

## 2016-05-14 NOTE — Telephone Encounter (Signed)
Cream would not work for her condition.  Please send Rx for cortenema, 100 mg/60 ml. Use one every night at bedtime, try to retain it as long as possible.  If her insurance will not cover that, then she needs to find out from them the cost for cortifoam 10% or the new uceris foam.

## 2016-05-17 ENCOUNTER — Ambulatory Visit: Payer: BLUE CROSS/BLUE SHIELD | Admitting: Internal Medicine

## 2016-05-18 ENCOUNTER — Telehealth: Payer: Self-pay | Admitting: Gastroenterology

## 2016-05-18 ENCOUNTER — Encounter: Payer: Self-pay | Admitting: Physician Assistant

## 2016-05-18 ENCOUNTER — Other Ambulatory Visit: Payer: Self-pay | Admitting: Gastroenterology

## 2016-05-18 NOTE — Telephone Encounter (Signed)
Dr. Fuller Plan pt. patient is requesting pathology results that Dr. Loletha Carrow performed. She also has questions regarding her meds. She states that she will be out of lomodil and prednisone this weekend. She also states that she is having bloody stools and abd cramps.

## 2016-05-18 NOTE — Telephone Encounter (Signed)
I gave this patient a paper script for lomotil in the [place] last week. She is also Dr Lynne Leader patient (I was doing the procedure while he was away).  So if there are any further Rx issues, I am sure he would be happy to attend to them.

## 2016-05-19 ENCOUNTER — Other Ambulatory Visit: Payer: Self-pay

## 2016-05-19 MED ORDER — PREDNISONE 10 MG PO TABS: ORAL_TABLET | ORAL | Status: DC

## 2016-05-19 MED ORDER — MESALAMINE 400 MG PO CPDR: 800.0000 mg | DELAYED_RELEASE_CAPSULE | Freq: Three times a day (TID) | ORAL | Status: DC

## 2016-05-19 MED ORDER — DIPHENOXYLATE-ATROPINE 2.5-0.025 MG PO TABS: 2.0000 | ORAL_TABLET | Freq: Three times a day (TID) | ORAL | Status: DC | PRN

## 2016-05-19 MED ORDER — GLYCOPYRROLATE 2 MG PO TABS: 2.0000 mg | ORAL_TABLET | Freq: Two times a day (BID) | ORAL | Status: DC

## 2016-05-19 MED FILL — GLYCOPYRROLATE 2 MG TABLET: 2 | 30 days supply | Qty: 60 | Fill #0

## 2016-05-19 MED FILL — predniSONE 10 MG TABS: 10 | 21 days supply | Qty: 126 | Fill #0

## 2016-05-21 NOTE — Telephone Encounter (Signed)
Left message on machine to call back  

## 2016-05-21 NOTE — Telephone Encounter (Signed)
Addressed through My Chart

## 2016-05-25 ENCOUNTER — Telehealth: Payer: Self-pay | Admitting: Gastroenterology

## 2016-05-25 ENCOUNTER — Encounter: Payer: Self-pay | Admitting: Gastroenterology

## 2016-05-25 ENCOUNTER — Ambulatory Visit (INDEPENDENT_AMBULATORY_CARE_PROVIDER_SITE_OTHER): Payer: BLUE CROSS/BLUE SHIELD | Admitting: Gastroenterology

## 2016-05-25 ENCOUNTER — Other Ambulatory Visit (INDEPENDENT_AMBULATORY_CARE_PROVIDER_SITE_OTHER): Payer: BLUE CROSS/BLUE SHIELD

## 2016-05-25 VITALS — BP 118/80 | HR 98 | Ht 64.0 in | Wt 198.0 lb

## 2016-05-25 DIAGNOSIS — K51011 Ulcerative (chronic) pancolitis with rectal bleeding: Secondary | ICD-10-CM | POA: Diagnosis not present

## 2016-05-25 LAB — COMPREHENSIVE METABOLIC PANEL
ALT: 18 U/L (ref 0–35)
AST: 12 U/L (ref 0–37)
Albumin: 3.7 g/dL (ref 3.5–5.2)
Alkaline Phosphatase: 88 U/L (ref 39–117)
BUN: 14 mg/dL (ref 6–23)
CHLORIDE: 102 meq/L (ref 96–112)
CO2: 33 meq/L — AB (ref 19–32)
Calcium: 8.9 mg/dL (ref 8.4–10.5)
Creatinine, Ser: 0.82 mg/dL (ref 0.40–1.20)
GFR: 77.84 mL/min (ref >60.00)
GLUCOSE: 111 mg/dL — AB (ref 70–99)
POTASSIUM: 4.1 meq/L (ref 3.5–5.1)
SODIUM: 139 meq/L (ref 135–145)
Total Bilirubin: 0.3 mg/dL (ref 0.2–1.2)
Total Protein: 6.8 g/dL (ref 6.0–8.3)

## 2016-05-25 LAB — C-REACTIVE PROTEIN: CRP: 1.5 mg/dL (ref 0.5–20.0)

## 2016-05-25 LAB — CBC WITH DIFFERENTIAL/PLATELET
BASOS ABS: 0 10*3/uL (ref 0.0–0.1)
Basophils Relative: 0.2 % (ref 0.0–3.0)
EOS PCT: 0.3 % (ref 0.0–5.0)
Eosinophils Absolute: 0 10*3/uL (ref 0.0–0.7)
HCT: 35 % — ABNORMAL LOW (ref 36.0–46.0)
Hemoglobin: 11.7 g/dL — ABNORMAL LOW (ref 12.0–15.0)
LYMPHS ABS: 1.1 10*3/uL (ref 0.7–4.0)
Lymphocytes Relative: 13.8 % (ref 12.0–46.0)
MCHC: 33.3 g/dL (ref 30.0–36.0)
MCV: 87.2 fl (ref 78.0–100.0)
MONO ABS: 0.3 10*3/uL (ref 0.1–1.0)
MONOS PCT: 4.2 % (ref 3.0–12.0)
NEUTROS ABS: 6.5 10*3/uL (ref 1.4–7.7)
NEUTROS PCT: 81.5 % — AB (ref 43.0–77.0)
PLATELETS: 351 10*3/uL (ref 150.0–400.0)
RBC: 4.02 Mil/uL (ref 3.87–5.11)
RDW: 14.9 % (ref 11.5–15.5)
WBC: 8 10*3/uL (ref 4.0–10.5)

## 2016-05-25 LAB — SEDIMENTATION RATE: Sed Rate: 16 mm/hr (ref 0–30)

## 2016-05-25 MED ORDER — MESALAMINE 400 MG PO CPDR: 1600.0000 mg | DELAYED_RELEASE_CAPSULE | Freq: Three times a day (TID) | ORAL | Status: DC

## 2016-05-25 MED ORDER — HYDROCORTISONE 100 MG/60ML RE ENEM: 1.0000 | ENEMA | Freq: Two times a day (BID) | RECTAL | Status: DC

## 2016-05-25 MED FILL — DELZICOL DR 400 MG CAPSULE: 400 | 30 days supply | Qty: 360 | Fill #0

## 2016-05-25 MED FILL — HYDROCORTISONE 100 MG/60 ML: 100 | 28 days supply | Qty: 3360 | Fill #0

## 2016-05-25 MED FILL — DIPHENOXYLATE/ATROPINE TAB: 2.5-0.025 | 10 days supply | Qty: 60 | Fill #0

## 2016-05-25 NOTE — Progress Notes (Signed)
    History of Present Illness: This is a 50 year old female with persistent bloody diarrhea. She has a long history of ulcerative colitis diagnosed as a teenager. Minimal symptoms over the past 10 years and had not been seen in our office since 2013 until visit in May 2017. Colonoscopy in March 2013 showed universal ulcerative colitis with multiple pseudopolyps. Biopsies showed active chronic colitis in both the right and left colon. She was maintained on mesalamine at that time however she's been off medication for several years and did not follow up with our practice until May 2017. For the past 2 to 2.5 months she has had multiple loose, bloody bowel movements per day associated with lower abdominal cramping. Colonoscopy by Dr. Loletha Carrow on June 15 showed severe left-sided colitis and right sided scarring. GI pathogen panel was negative. CRP elevated at 11.120. She was placed on prednisone 60 mg daily, mesalamine 800 mg 3 times a day, HC enema hs and Robinul 2 mg bid prn. She has had some improvement in symptoms. She is having between 4 and 12 small bloody bowel movements daily. She states on days when she only has light clear liquids she has 4-5 bowel movements however if she has more food she will have 10-12 small bloody bowel movements.   Current Medications, Allergies, Past Medical History, Past Surgical History, Family History and Social History were reviewed in [employer] record.  Physical Exam: General: Well developed, well nourished, fatigued appearing, no acute distress Head: Normocephalic and atraumatic Eyes:  sclerae anicteric, EOMI Ears: Normal auditory acuity Mouth: No deformity or lesions Lungs: Clear throughout to auscultation Heart: Regular rate and rhythm; no murmurs, rubs or bruits Abdomen: Soft, minimal generalized tenderness without R/G and non distended. No masses, hepatosplenomegaly or hernias noted. Normal Bowel sounds Musculoskeletal: Symmetrical with no  gross deformities  Pulses:  Normal pulses noted Extremities: No clubbing, cyanosis, edema or deformities noted Neurological: Alert oriented x 4, grossly nonfocal Psychological:  Alert and cooperative. Normal mood and affect  Assessment and Recommendations:  1. Severe ulcerative colitis flare, with inadequate response from her current regimen including prednisone 60 mg daily, mesalamine 2.4 g daily, hydrocortisone enemas hs and Robinul twice a day. I recommended hospitalization today for IV corticosteroids, bowel rest and likely starting IV Remicade at 5-10 mg/kg however she declines. She would like to try for a little while longer as an outpatient. I advised that if her symptoms worsen or fail to improve over the next several days hospitalization will be necessary. Increase mesalamine to 1.6 g 3 times a day and increase hydrocortisone enemas to twice daily. Continue prednisone 60 mg daily and Robinul 1-2 mg twice daily as needed. Clear liquid diet with clear protein supplements for the next 3-5 days and then advance gradually to a low residue diet if improving. Quantiferon gold was negative in September 2016. Check CBC, CMP, ESR, CRP, HBsAg today. If she does not require hospitalization I recommended starting IV Remicade as outpatient and she is considering this. Return office visit in 2 weeks.

## 2016-05-25 NOTE — Patient Instructions (Signed)
Your physician has requested that you go to the basement for lab work before leaving today.  We have sent the following medications to your pharmacy for you to pick up at your convenience: Delzicol 4 capsules by mouth three times a day and Hydrocortisone enemas twice daily.   Remain on a clear liquid diet x 4-5 days.   You can drink Ensure clear nutrition drinks and Boost breeze clear drinks.   Call back in a few days if your symptoms have not improved or get worse.  Thank you for choosing me and [facility].  Pricilla Riffle. Dagoberto Ligas., MD., Marval Regal

## 2016-05-25 NOTE — Telephone Encounter (Signed)
Patient called this evening, requesting to be admitted tomorrow for a UC flare. I reviewed her clinic note from earlier today,it was recommended she be admitted for IV steroids and likely Remicade however she declined today. She has been able to arrange her work schedule and would like to be admitted tomorrow if possible. Hopefully this can be coordinated through the hospitalist and directly admit her tomorrow to avoid going through the ER. She was notified if she feels poorly enough to be admitted tonight she would need to go to the ER.  Norberto Sorenson wanted to make you aware    Viviana Simpler, I will discuss with the PA for [facility] in the morning and help coordinate admission.

## 2016-05-25 NOTE — Telephone Encounter (Signed)
Offer the patient an appointment today at 2:45. She states she has had 8 bloody stools so far today. On Prednisone 60 mg daily and an enema at night. .  She states the staff "suck and don't know what they are doing." As with all encounters with the patient, she is quick to yell and she is highly irascible.

## 2016-05-25 NOTE — Addendum Note (Signed)
Addended by: Marzella Schlein on: 05/25/2016 04:29 PM   Modules accepted: Orders

## 2016-05-25 NOTE — Telephone Encounter (Signed)
Clarified with pharmacist that patient needs enough for her to take twice daily. So he filled for 3,631m of enemas so patient can have a 30 day supply.

## 2016-05-26 ENCOUNTER — Inpatient Hospital Stay (HOSPITAL_COMMUNITY)
Admission: AD | Admit: 2016-05-26 | Discharge: 2016-05-28 | DRG: 387 | Disposition: A | Discharge status: Home / Self Care | Payer: BLUE CROSS/BLUE SHIELD | Source: Ambulatory Visit | Attending: Internal Medicine | Admitting: Internal Medicine

## 2016-05-26 ENCOUNTER — Encounter (HOSPITAL_COMMUNITY): Payer: Self-pay

## 2016-05-26 DIAGNOSIS — Z8711 Personal history of peptic ulcer disease: Secondary | ICD-10-CM

## 2016-05-26 DIAGNOSIS — Z800 Family history of malignant neoplasm of digestive organs: Secondary | ICD-10-CM

## 2016-05-26 DIAGNOSIS — F411 Generalized anxiety disorder: Secondary | ICD-10-CM | POA: Diagnosis present

## 2016-05-26 DIAGNOSIS — Z8249 Family history of ischemic heart disease and other diseases of the circulatory system: Secondary | ICD-10-CM

## 2016-05-26 DIAGNOSIS — K51311 Ulcerative (chronic) rectosigmoiditis with rectal bleeding: Secondary | ICD-10-CM | POA: Diagnosis not present

## 2016-05-26 DIAGNOSIS — K51011 Ulcerative (chronic) pancolitis with rectal bleeding: Secondary | ICD-10-CM | POA: Diagnosis not present

## 2016-05-26 DIAGNOSIS — K219 Gastro-esophageal reflux disease without esophagitis: Secondary | ICD-10-CM | POA: Diagnosis present

## 2016-05-26 DIAGNOSIS — Z833 Family history of diabetes mellitus: Secondary | ICD-10-CM | POA: Diagnosis not present

## 2016-05-26 DIAGNOSIS — Z87891 Personal history of nicotine dependence: Secondary | ICD-10-CM

## 2016-05-26 DIAGNOSIS — K51018 Ulcerative (chronic) pancolitis with other complication: Secondary | ICD-10-CM | POA: Diagnosis not present

## 2016-05-26 DIAGNOSIS — K5190 Ulcerative colitis, unspecified, without complications: Principal | ICD-10-CM | POA: Diagnosis present

## 2016-05-26 DIAGNOSIS — K529 Noninfective gastroenteritis and colitis, unspecified: Secondary | ICD-10-CM | POA: Insufficient documentation

## 2016-05-26 DIAGNOSIS — Z9049 Acquired absence of other specified parts of digestive tract: Secondary | ICD-10-CM | POA: Diagnosis not present

## 2016-05-26 DIAGNOSIS — K921 Melena: Secondary | ICD-10-CM | POA: Diagnosis not present

## 2016-05-26 LAB — HEPATITIS B SURFACE ANTIGEN: Hepatitis B Surface Ag: NEGATIVE

## 2016-05-26 MED ORDER — METHYLPREDNISOLONE SODIUM SUCC 125 MG IJ SOLR
60.0000 mg | Freq: Two times a day (BID) | INTRAMUSCULAR | Status: DC
  Administered 2016-05-26 – 2016-05-27 (×2): 60 mg via INTRAVENOUS
  Filled 2016-05-26 (×2): qty 2

## 2016-05-26 MED ORDER — TRAMADOL HCL 50 MG PO TABS: 12.5000 mg | ORAL_TABLET | Freq: Two times a day (BID) | ORAL | Status: DC | PRN

## 2016-05-26 MED ORDER — SODIUM CHLORIDE 0.9 % IV SOLN
5.0000 mg/kg | Freq: Once | INTRAVENOUS | Status: AC
  Administered 2016-05-26: 400 mg via INTRAVENOUS
  Filled 2016-05-26: qty 40

## 2016-05-26 MED ORDER — ACETAMINOPHEN 325 MG PO TABS
650.0000 mg | ORAL_TABLET | ORAL | Status: AC
  Administered 2016-05-26: 650 mg via ORAL
  Filled 2016-05-26: qty 2

## 2016-05-26 MED ORDER — MESALAMINE 400 MG PO CPDR
1600.0000 mg | DELAYED_RELEASE_CAPSULE | Freq: Three times a day (TID) | ORAL | Status: DC
  Administered 2016-05-26 – 2016-05-28 (×6): 1600 mg via ORAL
  Filled 2016-05-26 (×7): qty 4

## 2016-05-26 MED ORDER — METHYLPREDNISOLONE SODIUM SUCC 125 MG IJ SOLR: 60.0000 mg | Freq: Four times a day (QID) | INTRAMUSCULAR | Status: DC

## 2016-05-26 MED ORDER — HYDROCORTISONE 100 MG/60ML RE ENEM
1.0000 | ENEMA | Freq: Two times a day (BID) | RECTAL | Status: DC
  Administered 2016-05-26 – 2016-05-28 (×4): 100 mg via RECTAL
  Filled 2016-05-26 (×4): qty 1

## 2016-05-26 MED ORDER — ONDANSETRON HCL 4 MG/2ML IJ SOLN: 4.0000 mg | Freq: Four times a day (QID) | INTRAMUSCULAR | Status: DC | PRN

## 2016-05-26 MED ORDER — METHYLPREDNISOLONE SODIUM SUCC 125 MG IJ SOLR
125.0000 mg | Freq: Once | INTRAMUSCULAR | Status: AC
  Administered 2016-05-26: 125 mg via INTRAVENOUS
  Filled 2016-05-26: qty 2

## 2016-05-26 MED ORDER — ACETAMINOPHEN 500 MG PO TABS: 1000.0000 mg | ORAL_TABLET | Freq: Four times a day (QID) | ORAL | Status: DC | PRN

## 2016-05-26 MED ORDER — ALBUTEROL SULFATE (2.5 MG/3ML) 0.083% IN NEBU: 3.0000 mL | INHALATION_SOLUTION | Freq: Four times a day (QID) | RESPIRATORY_TRACT | Status: DC | PRN

## 2016-05-26 MED ORDER — TRAMADOL HCL 50 MG PO TABS
25.0000 mg | ORAL_TABLET | Freq: Four times a day (QID) | ORAL | Status: DC | PRN
  Administered 2016-05-27: 25 mg via ORAL
  Filled 2016-05-26: qty 1

## 2016-05-26 MED ORDER — GLYCOPYRROLATE 1 MG PO TABS
2.0000 mg | ORAL_TABLET | Freq: Two times a day (BID) | ORAL | Status: DC
  Administered 2016-05-26 – 2016-05-28 (×4): 2 mg via ORAL
  Filled 2016-05-26 (×4): qty 2

## 2016-05-26 MED ORDER — PANTOPRAZOLE SODIUM 40 MG PO TBEC
40.0000 mg | DELAYED_RELEASE_TABLET | Freq: Every day | ORAL | Status: DC
Start: 2016-05-27 — End: 2016-05-28
  Administered 2016-05-27 – 2016-05-28 (×2): 40 mg via ORAL
  Filled 2016-05-26 (×2): qty 1

## 2016-05-26 MED ORDER — TRAZODONE HCL 50 MG PO TABS
25.0000 mg | ORAL_TABLET | Freq: Every evening | ORAL | Status: DC | PRN
  Administered 2016-05-26 – 2016-05-27 (×2): 50 mg via ORAL
  Filled 2016-05-26 (×2): qty 1

## 2016-05-26 MED ORDER — DIPHENOXYLATE-ATROPINE 2.5-0.025 MG PO TABS
2.0000 | ORAL_TABLET | Freq: Three times a day (TID) | ORAL | Status: DC | PRN
  Administered 2016-05-26 – 2016-05-28 (×4): 2 via ORAL
  Filled 2016-05-26 (×4): qty 2

## 2016-05-26 MED ORDER — METHYLPREDNISOLONE SODIUM SUCC 125 MG IJ SOLR: 60.0000 mg | Freq: Two times a day (BID) | INTRAMUSCULAR | Status: DC

## 2016-05-26 MED ORDER — ONDANSETRON HCL 4 MG PO TABS: 4.0000 mg | ORAL_TABLET | Freq: Four times a day (QID) | ORAL | Status: DC | PRN

## 2016-05-26 MED ORDER — DIPHENHYDRAMINE HCL 25 MG PO CAPS
25.0000 mg | ORAL_CAPSULE | ORAL | Status: AC
  Administered 2016-05-26: 25 mg via ORAL
  Filled 2016-05-26: qty 1

## 2016-05-26 NOTE — Plan of Care (Signed)
Problem: Nutrition: Goal: Adequate nutrition will be maintained Outcome: Progressing On clear liquids for now, may advance diet soon

## 2016-05-26 NOTE — Consult Note (Signed)
Consultation  Referring Provider:  Dr. Hollice Gong    Primary Care Physician:  Annye Asa, MD Primary Gastroenterologist:   Dr. Fuller Plan      Reason for Consultation:  Ulcerative Colitis Flare            HPI:   Melissa Cohen is a 50 y.o. Caucasian female with history of ulcerative colitis, who was directly admitted to the hospital today due to UC flare. Pt was seen in our outpatient clinic by Dr. Fuller Plan yesterday and described persistent bloody diarrhea over the past 2- 2.5 months associated with lower abdominal cramping. Pt did report some improvement after Prednisone 39m qd and other treatments as listed below, but continued to describe 4-12 small bloody bowel movements daily. This is impacted by how much food she eats during the day.  At time of this visit yesterday, she was diagnosed with severe ulcerative colitis flare with inadequate response to her current regimen including Prednisone 667m mesalamine 2.4 g daily, HC enemas hs and Robinul BID. It was recommended she be hospitalized for IV corticosterois, bowel rest and initiation of Remicade at 5-1047mg, but pt declined. At that time her mesalamine was increased to 1.6 G 3 times a day and HC enemas were increased to BID. Pt was told to continue Pred 28m29mily and Robinul 1-2mg 35m prn. It was noted that Quantiferon Gold was negative in Sept 2016-CBC, CMP, ESR, CRP and HBsAg were ordered.  Patient then declined overnight and requested admission this morning.  Today, the patient tells me that she was able to rearrange her work schedule and come to the hospital for her symptoms as above. There has been no acute change overnight. She does ask many questions regarding Remicade and plans for the future. She denies any new concerns.  Recent GI Workup: 05/26/16-Labs at [facility]: CRP 1.5, Hgb 11.7, Glucose 111, CO2 33, ESR 16, Hep B surface Ag neg 05/13/16-Colonoscopy, Dr. DanisLoletha Carrowression-severe left-sided colitis and right sided scarring, Gi  path panel negative, CRP elevated at 11.120-she was placed on Prednisone 28mg 52my, mesalamine 800mg T70mnd HC enema hs and Robinul 2mg bid36mn. 01/2012-Colonoscopy: impression-universal ulcerative colitis with multiple pseudopolyps: Path-active chronic colitis in both right and left colon-was initially maintained on mesalamine at that time, however had been off that medication for several years before returning May 2017  Past Medical History  Diagnosis Date  . Ulcerative colitis     Dx at age 1  . Sk85 cancer, basal cell   . Gastric ulcer   . GERD (gastroesophageal reflux disease)     Past Surgical History  Procedure Laterality Date  . Appendectomy    . Uterine ablation    . Tubal ligation    . Breast enhancement surgery    . Tonsillectomy and adenoidectomy    . Cholecystectomy  2011  . Right eye lasix      02/17/12  . Colonoscopy      Family History  Problem Relation Age of Onset  . Prostate cancer Father   . Hyperlipidemia Father   . Barrett's esophagus Father   . Cancer Father     prostate  . Colon cancer Father 73    ap49ndical adenocarcinoma 2011  . Diabetes Sister   . Diabetes      Grandparents  . Heart attack Maternal Grandfather   . Heart disease Maternal Grandfather   . Ulcerative colitis Mother   . Ulcerative colitis Maternal Grandmother   . Crohn's disease Brother   .  Esophageal cancer Neg Hx   . Rectal cancer Neg Hx   . Stomach cancer Neg Hx   . Allergic rhinitis Neg Hx   . Angioedema Neg Hx   . Asthma Neg Hx   . Atopy Neg Hx   . Eczema Neg Hx   . Immunodeficiency Neg Hx   . Colon polyps Neg Hx     Social History  Substance Use Topics  . Smoking status: Former Smoker -- 0.50 packs/day for 25 years    Types: Cigarettes    Quit date: 02/27/2006  . Smokeless tobacco: Never Used  . Alcohol Use: 1.8 oz/week    3 Glasses of wine per week     Comment: every other weekend socially    Prior to Admission medications   Medication Sig Start Date  End Date Taking? Authorizing Provider  acetaminophen (TYLENOL) 500 MG tablet Take 1,000 mg by mouth every 6 (six) hours as needed for headache.   Yes Historical Provider, MD  albuterol (PROVENTIL HFA;VENTOLIN HFA) 108 (90 Base) MCG/ACT inhaler Inhale 2 puffs into the lungs every 6 (six) hours as needed for wheezing or shortness of breath. 02/20/16  Yes Midge Minium, MD  diphenoxylate-atropine (LOMOTIL) 2.5-0.025 MG tablet Take 2 tablets by mouth every 8 (eight) hours as needed for diarrhea or loose stools. 05/19/16  Yes Ladene Artist, MD  esomeprazole (NEXIUM) 40 MG capsule TAKE 1 CAPSULE BY MOUTH ONCE DAILY 03/31/16  Yes Midge Minium, MD  glycopyrrolate (ROBINUL) 2 MG tablet Take 1 tablet (2 mg total) by mouth 2 (two) times daily. 05/19/16  Yes Ladene Artist, MD  hydrocortisone (CORTENEMA) 100 MG/60ML enema Place 1 enema (100 mg total) rectally 2 (two) times daily. 05/25/16  Yes Ladene Artist, MD  Mesalamine (DELZICOL) 400 MG CPDR DR capsule Take 4 capsules (1,600 mg total) by mouth 3 (three) times daily. 05/25/16  Yes Ladene Artist, MD  OVER THE COUNTER MEDICATION Take 1 tablet by mouth daily. Juice plus   Yes Historical Provider, MD  predniSONE (DELTASONE) 10 MG tablet Take 60 mg by mouth for 3 weeks and as directed Patient taking differently: Take 60 mg by mouth daily. continuous 05/19/16  Yes Ladene Artist, MD  ranitidine (ZANTAC) 300 MG tablet Take 1 tablet (300 mg total) by mouth at bedtime. 03/17/16  Yes Adelina Mings, MD  traMADol (ULTRAM) 50 MG tablet Take 1 tablet (50 mg total) by mouth every 8 (eight) hours as needed. Patient taking differently: Take 50 mg by mouth every 8 (eight) hours as needed. 12.5 mg ( 1/4 tablet ) 2 x a day 04/19/16  Yes Midge Minium, MD  traZODone (DESYREL) 50 MG tablet Take 0.5-1 tablets (25-50 mg total) by mouth at bedtime as needed for sleep. 07/31/15  Yes Midge Minium, MD    Current Facility-Administered Medications  Medication  Dose Route Frequency Provider Last Rate Last Dose  . methylPREDNISolone sodium succinate (SOLU-MEDROL) 125 mg/2 mL injection 125 mg  125 mg Intravenous Once Mir Marry Guan, MD      . methylPREDNISolone sodium succinate (SOLU-MEDROL) 125 mg/2 mL injection 60 mg  60 mg Intravenous Q6H Mir Marry Guan, MD        Allergies as of 05/26/2016 - Review Complete 05/26/2016  Allergen Reaction Noted  . Cimetidine  02/09/2007  . Escitalopram oxalate  02/23/2012  . Mobic [meloxicam] Swelling 12/18/2015     Review of Systems:    Constitutional: No weight loss, fever, chills, weakness or  fatigue HEENT: Eyes: No change in vision               Ears, Nose, Throat:  No change in hearing Skin: No rash or itching Cardiovascular: No chest pain or palpitations    Respiratory: No SOB or cough Gastrointestinal: See HPI and otherwise negative Genitourinary: No dysuria or change in urinary frequency Neurological: No headache or dizziness Musculoskeletal: No new muscle or joint pain Hematologic: hematochezia as in HPI   Psychiatric: No history of depression or anxiety   Physical Exam:  Vital signs in last 24 hours: Temp:  [97.8 F (36.6 C)] 97.8 F (36.6 C) (06/28 0930) Pulse Rate:  [78-98] 78 (06/28 0930) Resp:  [18] 18 (06/28 0930) BP: (118-125)/(80-92) 125/92 mmHg (06/28 0930) SpO2:  [98 %] 98 % (06/28 0930) Weight:  [194 lb 9.6 oz (88.27 kg)-198 lb (89.812 kg)] 194 lb 9.6 oz (88.27 kg) (06/28 0930)   General:   Pleasant Caucasian female appears to be in NAD, Well developed, Well nourished, alert and cooperative Head:  Normocephalic and atraumatic. Eyes:   PEERL, EOMI. No icterus. Conjunctiva pink. Ears:  Normal auditory acuity. Neck:  Supple Throat: Oral cavity and pharynx without inflammation, swelling or lesion. Teeth in good condition. Lungs: Respirations even and unlabored. Lungs clear to auscultation bilaterally.   No wheezes, crackles, or rhonchi.  Heart: Normal S1, S2. No  MRG. Regular rate and rhythm. No peripheral edema, cyanosis or pallor.  Abdomen:  Soft, nondistended, Mild generalized tenderness to deep palpation in epigastrum and lb/l lower abdomen. No rebound or guarding. Normal bowel sounds. No appreciable masses or hepatomegaly. Rectal:  Not performed.  Msk:  Symmetrical without gross deformities. Peripheral pulses intact.  Extremities:  Without edema, no deformity or joint abnormality.  Neurologic:  Alert and  oriented x4;  grossly normal neurologically.  Skin:   Dry and intact without significant lesions or rashes. Psychiatric: Oriented to person, place and time. Demonstrates good judgement and reason without abnormal affect or behaviors.   LAB RESULTS:  Recent Labs  05/25/16 1539  WBC 8.0  HGB 11.7*  HCT 35.0*  PLT 351.0   BMET  Recent Labs  05/25/16 1539  NA 139  K 4.1  CL 102  CO2 33*  GLUCOSE 111*  BUN 14  CREATININE 0.82  CALCIUM 8.9   LFT  Recent Labs  05/25/16 1539  PROT 6.8  ALBUMIN 3.7  AST 12  ALT 18  ALKPHOS 88  BILITOT 0.3   PT/INR No results for input(s): LABPROT, INR in the last 72 hours.  STUDIES: No results found.   PREVIOUS ENDOSCOPIES:            See HPI   Impression / Plan:  Impression: 1. Ulcerative Colitis Flare: pt has not responded to outpatient therapy including Prednisone 60 mg daily, mesalamine 2.4 g daily, hydrocortisone enemas hs and Robinul twice a day which was initiated 05/13/16-Quanitferon gold test neg sept 2016, hep b surf ag negative 05/25/16- will initiate IV corticosteroids and Remicaid at 5-31m/kg as per Dr. SLynne Leaderrecommendations  Plan: 1. IV corticosteroids: Solumedrol 693mBID 2. Remicaid infusion: Ordered today. Please call Dr. NaSilverio Decampith any questions. 3. Continue supportive measures 4. Patient to remain NPO at this time 5. Will discuss above with Dr. NaSilverio Decampplease await any further recs  Thank you for your kind consultation, we will continue to  follow.  JeLavone Nianemmon  05/26/2016, 10:28 AM Pager #: [phone]

## 2016-05-26 NOTE — Progress Notes (Signed)
Manual calculation of BSA and dosing for infliximab completed.  Verification by Jena Gauss and Aldean Baker

## 2016-05-26 NOTE — H&P (Signed)
History and Physical  Melissa Cohen KPT:[id number] DOB: [birth year] DOA: 05/26/2016   PCP: Annye Asa, MD   Patient coming from: Home via direct admit from GI clinic.   Chief Complaint: Bloody stools   HPI: Melissa Cohen is a 50 y.o. female with medical history significant for ulcerative colitis diagnosed in her teens and well controlled until earlier this year who is being admitted due to continued UC flare despite outpatient management. She tells me she has been well until earlier this year when she had a UTI as well as a respiratory issue for which she was on antibiotics. She noticed painless blood in her stool without diarrhea or cramping in late February. She scheduled an elective colonoscopy with Dr. Fuller Plan at that time. While she awaited this, she started to develop abdominal cramping, diarrhea and worsening blood in the stool over the course of the next couple of months. She saw a provider in the GI office in late May and was started on Prednisone 45m PO daily as well as mesalamine 8062mPO TID. She is still on this medication, and had mild improvement with this regimen. She had a colonoscopy with Dr. DaLoletha Carrown June 15 which showed severe left sided colitis and right sided scarring.  She had a follow up with Dr. StFuller Planesterday 6/27 and he recommended hospital admission for IV steroids and initiation of Remicade. The patient was unable to come to the hospital yesterday as she had to get her affairs in order; so she presents today per GI recommendation.  Currently she denies nausea, shortness of breath, fevers. Having 8-15 BMs per day, very loose and watery and bloody. No dizziness, falls, syncope, no emesis. She has associated abdominal cramping, but no chest pain.  Review of Systems: Please see HPI for pertinent positives and negatives. A complete 10 system review of systems are otherwise negative.  Past Medical History  Diagnosis Date  . Ulcerative colitis     Dx at age 50  . Skin cancer, basal cell   . Gastric ulcer   . GERD (gastroesophageal reflux disease)    Past Surgical History  Procedure Laterality Date  . Appendectomy    . Uterine ablation    . Tubal ligation    . Breast enhancement surgery    . Tonsillectomy and adenoidectomy    . Cholecystectomy  2011  . Right eye lasix      02/17/12  . Colonoscopy      Social History:  reports that she quit smoking about 10 years ago. Her smoking use included Cigarettes. She has a 12.5 pack-year smoking history. She has never used smokeless tobacco. She reports that she drinks about 1.8 oz of alcohol per week. She reports that she does not use illicit drugs.   Allergies  Allergen Reactions  . Cimetidine     REACTION: anaphalactic shock  . Escitalopram Oxalate     Caused ankle swelling   . Mobic [Meloxicam] Swelling    Family History  Problem Relation Age of Onset  . Prostate cancer Father   . Hyperlipidemia Father   . Barrett's esophagus Father   . Cancer Father     prostate  . Colon cancer Father [id number]  appendical adenocarcinoma 2011  . Diabetes Sister   . Diabetes      Grandparents  . Heart attack Maternal Grandfather   . Heart disease Maternal Grandfather   . Ulcerative colitis Mother   . Ulcerative colitis Maternal Grandmother   .  Crohn's disease Brother   . Esophageal cancer Neg Hx   . Rectal cancer Neg Hx   . Stomach cancer Neg Hx   . Allergic rhinitis Neg Hx   . Angioedema Neg Hx   . Asthma Neg Hx   . Atopy Neg Hx   . Eczema Neg Hx   . Immunodeficiency Neg Hx   . Colon polyps Neg Hx      Prior to Admission medications   Medication Sig Start Date End Date Taking? Authorizing Provider  acetaminophen (TYLENOL) 500 MG tablet Take 1,000 mg by mouth every 6 (six) hours as needed for headache.   Yes Historical Provider, MD  albuterol (PROVENTIL HFA;VENTOLIN HFA) 108 (90 Base) MCG/ACT inhaler Inhale 2 puffs into the lungs every 6 (six) hours as needed for wheezing or shortness of  breath. 02/20/16  Yes Midge Minium, MD  diphenoxylate-atropine (LOMOTIL) 2.5-0.025 MG tablet Take 2 tablets by mouth every 8 (eight) hours as needed for diarrhea or loose stools. 05/19/16  Yes Ladene Artist, MD  esomeprazole (NEXIUM) 40 MG capsule TAKE 1 CAPSULE BY MOUTH ONCE DAILY 03/31/16  Yes Midge Minium, MD  glycopyrrolate (ROBINUL) 2 MG tablet Take 1 tablet (2 mg total) by mouth 2 (two) times daily. 05/19/16  Yes Ladene Artist, MD  hydrocortisone (CORTENEMA) 100 MG/60ML enema Place 1 enema (100 mg total) rectally 2 (two) times daily. 05/25/16  Yes Ladene Artist, MD  Mesalamine (DELZICOL) 400 MG CPDR DR capsule Take 4 capsules (1,600 mg total) by mouth 3 (three) times daily. 05/25/16  Yes Ladene Artist, MD  OVER THE COUNTER MEDICATION Take 1 tablet by mouth daily. Juice plus   Yes Historical Provider, MD  predniSONE (DELTASONE) 10 MG tablet Take 60 mg by mouth for 3 weeks and as directed Patient taking differently: Take 60 mg by mouth daily. continuous 05/19/16  Yes Ladene Artist, MD  ranitidine (ZANTAC) 300 MG tablet Take 1 tablet (300 mg total) by mouth at bedtime. 03/17/16  Yes Adelina Mings, MD  traMADol (ULTRAM) 50 MG tablet Take 1 tablet (50 mg total) by mouth every 8 (eight) hours as needed. Patient taking differently: Take 50 mg by mouth every 8 (eight) hours as needed. 12.5 mg ( 1/4 tablet ) 2 x a day 04/19/16  Yes Midge Minium, MD  traZODone (DESYREL) 50 MG tablet Take 0.5-1 tablets (25-50 mg total) by mouth at bedtime as needed for sleep. 07/31/15  Yes Midge Minium, MD    Physical Exam: BP 125/92 mmHg  Pulse 78  Temp(Src) 97.8 F (36.6 C) (Oral)  Resp 18  Ht _0  (1.626 m)  Wt 88.27 kg (194 lb 9.6 oz)  BMI 33.39 kg/m2  SpO2 98%  LMP 09/02/2013  General:  Alert, oriented, calm, in no acute distress  Eyes: pupils round and reactive to light and accomodation, clear sclerea Neck: supple, no masses, trachea mildline  Cardiovascular: RRR, no  murmurs or rubs, no peripheral edema  Respiratory: clear to auscultation bilaterally, no wheezes, no crackles  Abdomen: soft, nontender, nondistended, normal bowel tones heard  Skin: dry, no rashes  Musculoskeletal: no joint effusions, normal range of motion  Psychiatric: appropriate affect, normal speech  Neurologic: extraocular muscles intact, clear speech, moving all extremities with intact sensorium            Labs on Admission:  Basic Metabolic Panel:  Recent Labs Lab 05/25/16 1539  NA 139  K 4.1  CL 102  CO2 33*  GLUCOSE 111*  BUN 14  CREATININE 0.82  CALCIUM 8.9   Liver Function Tests:  Recent Labs Lab 05/25/16 1539  AST 12  ALT 18  ALKPHOS 88  BILITOT 0.3  PROT 6.8  ALBUMIN 3.7   No results for input(s): LIPASE, AMYLASE in the last 168 hours. No results for input(s): AMMONIA in the last 168 hours. CBC:  Recent Labs Lab 05/25/16 1539  WBC 8.0  NEUTROABS 6.5  HGB 11.7*  HCT 35.0*  MCV 87.2  PLT 351.0   Cardiac Enzymes: No results for input(s): CKTOTAL, CKMB, CKMBINDEX, TROPONINI in the last 168 hours.  BNP (last 3 results) No results for input(s): BNP in the last 8760 hours.  ProBNP (last 3 results) No results for input(s): PROBNP in the last 8760 hours.  CBG: No results for input(s): GLUCAP in the last 168 hours.  Radiological Exams on Admission: No results found.  Assessment/Plan  Present on Admission:  . Ulcerative colitis ([place]) - being admitted with persistent flare. Discussed with GI and accepted for direct admission. She had CBC, CMP, ESR/CRP all checked in the office yesterday. - start IV solumedrol 148m now, then 612mIV Q6 hours. Defer to GI if they prefer dose adjustment. - GI to see today - clear liquid diet for now - anticipate Remicade per GI  . Colitis  . GERD - IV PPI while on high dose steroids  DVT prophylaxis: SCDs due to bloody stools   Code Status: FULL   Family Communication: None present   Disposition  Plan: Likely to home when better.   Consults called: None, GI to see.   Admission status: Inpatient   Time spent: 56 minutes  Captola Teschner MoMarry GuanD [facility] Pager [phone] 7PM-7AM, please contact night-coverage [email or web address] Password [email or web address]6/28/2017, 10:36 AM

## 2016-05-26 NOTE — Telephone Encounter (Signed)
Ok thank you 

## 2016-05-26 NOTE — Telephone Encounter (Signed)
Admission completed by Hospitalist.

## 2016-05-26 NOTE — Progress Notes (Signed)
Infusion of Remicade completed following titration protocol.  Vital signs remained stable throughout.  No signs of allergic reaction noted.

## 2016-05-26 NOTE — Telephone Encounter (Signed)
See Dr Shelly Flatten note and my office note. Please help coordinate admission.

## 2016-05-27 ENCOUNTER — Other Ambulatory Visit: Payer: Self-pay | Admitting: *Deleted

## 2016-05-27 DIAGNOSIS — K5190 Ulcerative colitis, unspecified, without complications: Principal | ICD-10-CM

## 2016-05-27 DIAGNOSIS — K51011 Ulcerative (chronic) pancolitis with rectal bleeding: Secondary | ICD-10-CM

## 2016-05-27 LAB — CBC
HCT: 33.5 % — ABNORMAL LOW (ref 36.0–46.0)
Hemoglobin: 10.8 g/dL — ABNORMAL LOW (ref 12.0–15.0)
MCH: 29.3 pg (ref 26.0–34.0)
MCHC: 32.2 g/dL (ref 30.0–36.0)
MCV: 90.8 fL (ref 78.0–100.0)
PLATELETS: 280 10*3/uL (ref 150–400)
RBC: 3.69 MIL/uL — ABNORMAL LOW (ref 3.87–5.11)
RDW: 14.5 % (ref 11.5–15.5)
WBC: 7.8 10*3/uL (ref 4.0–10.5)

## 2016-05-27 LAB — BASIC METABOLIC PANEL
ANION GAP: 5 (ref 5–15)
BUN: 8 mg/dL (ref 6–20)
CALCIUM: 8.7 mg/dL — AB (ref 8.9–10.3)
CO2: 27 mmol/L (ref 22–32)
CREATININE: 0.58 mg/dL (ref 0.44–1.00)
Chloride: 109 mmol/L (ref 101–111)
GLUCOSE: 135 mg/dL — AB (ref 65–99)
Potassium: 3.7 mmol/L (ref 3.5–5.1)
Sodium: 141 mmol/L (ref 135–145)

## 2016-05-27 MED ORDER — PREDNISONE 20 MG PO TABS
60.0000 mg | ORAL_TABLET | Freq: Every day | ORAL | Status: DC
  Administered 2016-05-28: 60 mg via ORAL
  Filled 2016-05-27: qty 3

## 2016-05-27 NOTE — Progress Notes (Signed)
Progress Note   Subjective  [name] is a 50 year old Caucasian female who was admitted to the hospital on 05/26/16 for IV steroids and her first Remicade infusion for ulcerative colitis flare.  This morning, the patient is found sitting up in bed, she has been eating a clear liquid diet and doing fairly well. She tells me she had 2 small bowel movements this morning that continued to have some bright red blood in them, but were "may be somewhat more formed", than before. She has also had an increase in lower abdominal cramping overnight. The patient also tells me that her face and body are flushed after receiving her steroids yesterday. The patient does tell me that she is quite an anxious person and has a lot going on in her life. She does ask if we can provide her with anti-anxiety medication at this time. Patient also has multiple questions regarding her Remicade and plans for insurance coverage in the future" who is going to work on this?". Patient also wants to know what she needs to do with her diet when she leaves the hospital. She denies any new GI complaints.   Objective   Vital signs in last 24 hours: Temp:  [97.6 F (36.4 C)-98.4 F (36.9 C)] 98.3 F (36.8 C) (06/29 0856) Pulse Rate:  [70-92] 77 (06/29 0630) Resp:  [16-20] 16 (06/29 0630) BP: (112-141)/(74-89) 112/74 mmHg (06/29 0630) SpO2:  [97 %-99 %] 97 % (06/29 0630) Last BM Date: 05/26/16 (several per pt) General: Caucasian female in NAD Heart:  Regular rate and rhythm; no murmurs Lungs: Respirations even and unlabored, lungs CTA bilaterally Abdomen:  Soft, Mild ttp b/l lower quadrants and nondistended. Normal bowel sounds. Extremities:  Without edema. Neurologic:  Alert and oriented,  grossly normal neurologically. Psych:  Cooperative. Normal mood and affect.  Intake/Output from previous day: 06/28 0701 - 06/29 0700 In: 800 [P.O.:800] Out: -  Intake/Output this shift: Total I/O In: 600 [P.O.:600] Out: -    Lab Results:  Recent Labs  05/25/16 1539 05/27/16 0343  WBC 8.0 7.8  HGB 11.7* 10.8*  HCT 35.0* 33.5*  PLT 351.0 280   BMET  Recent Labs  05/25/16 1539 05/27/16 0343  NA 139 141  K 4.1 3.7  CL 102 109  CO2 33* 27  GLUCOSE 111* 135*  BUN 14 8  CREATININE 0.82 0.58  CALCIUM 8.9 8.7*   LFT  Recent Labs  05/25/16 1539  PROT 6.8  ALBUMIN 3.7  AST 12  ALT 18  ALKPHOS 88  BILITOT 0.3   PT/INR No results for input(s): LABPROT, INR in the last 72 hours.  Studies/Results: No results found.     Assessment / Plan:   Impression: 1. Ulcerative Colitis Flare: pt has not responded to outpatient therapy including Prednisone 60 mg daily, mesalamine 2.4 g daily, hydrocortisone enemas hs and Robinul twice a day which was initiated 05/13/16-Quanitferon gold test neg sept 2016, hep b surf ag negative 05/25/16- Pt received IV steroids yesterday and 1st Remicade infusion, today report "not much change"  Plan: 1. Will change patient to Prednsione 26m PO qd 2. Patient will be due for second Remicade infusion 538mkg  in 2 wks and then again in 4 wks-pt will negotiate timing of 6 wk dose at time of 2nd week dose as she is going on a cruise around this time period 3. Spoke with ReRollene FareN regarding pre-cert, will submit to Amy-pt aware that this will be based on her insurance timing  and not on the timing of the clinic. We will notify her as soon as we know something. 4. Per Dr. Silverio Decamp, pt may continue regular diet, we will make sure she can tolerate this before discharge 5. Timing of next Remicade will be on pt d/c order 6. Patient to follow in clinic in July 12th with Dr. Fuller Plan as scheduled 7. Discussed above with Dr. Silverio Decamp, we will see how patient does on PO steroids and regular diet here before discharging  Thank you for your kind consultation, we will continue to follow.  Active Problems:   GERD   Ulcerative colitis ([place])   Colitis    LOS: 1 day   Levin Erp  05/27/2016, 9:52 AM  Pager # [phone]

## 2016-05-27 NOTE — Progress Notes (Signed)
PROGRESS NOTE                                                                                                                                                                                                             Patient Demographics:    Melissa Cohen, is a 50 y.o. female, DOB - October 26, 1966, VJK:[id number]  Admit date - 05/26/2016   Admitting Physician Mir Marry Guan, MD  Outpatient Primary MD for the patient is Annye Asa, MD  LOS - 1  Outpatient Specialists: Dr Fuller Plan  No chief complaint on file.      Brief Narrative  50 y.o. female with ulcerative colitis diagnosed in her teens, well controlled until earlier this year was  admitted due to continued UC flare despite outpatient management. .  She scheduled an elective colonoscopy with Dr. Fuller Plan at that time. While she awaited this, she started to develop abdominal cramping, diarrhea and worsening blood in the stool over the course of the next couple of months. She saw a provider in the GI office in late May and was started on Prednisone 68m PO daily as well as mesalamine 8044mPO TID.  She had a colonoscopy with Dr. DaLoletha Carrown 6/15 which showed severe left sided colitis and right sided scarring.during follow up with Dr. StFuller Plann  6/27, he recommended hospital admission for IV steroids and initiation of Remicade. d hospital admission for IV steroids and initiation of Remicade.    Subjective:   Abdominal pain better today. No diarrhea or vomiting. Tolerating some regular diet.   Assessment  & Plan :    acute on chronic ulcerative colitis Failed outpt therapy. Given IV remicade on 6/28. Had some flushing of face  but no pruritis. Switched to po prednisone.Hydrocortisone enema. Continue  lomotil. Will monitor overnight to see if pt tolerating diet and if so discharge home in am. Supportive care with pain meds and antiemetics. GI will schedule outpt  remicade dosing and follow up.. Marland Kitchen GERD  PPI  Anxiety symptoms  reports being more anxious regarding her illness and personal stress. have instrucuted pt to follow up with her PCP regarding this.  Code Status : full   Family Communication  : none  Disposition Plan  : home in am if tolerating diet   Barriers For Discharge : monitor overnight to see if tolerates  diet  Consults  :  [facility]  Procedures  : NONE  DVT Prophylaxis  :  SCDs   Lab Results  Component Value Date   PLT 280 05/27/2016    Antibiotics  :    Anti-infectives    None        Objective:   Filed Vitals:   05/26/16 2133 05/27/16 0630 05/27/16 0856 05/27/16 1331  BP: 129/89 112/74  110/77  Pulse: 70 77  80  Temp: 97.6 F (36.4 C) 98.1 F (36.7 C) 98.3 F (36.8 C) 98.9 F (37.2 C)  TempSrc: Oral Oral Oral Oral  Resp: 16 16  15   Height:      Weight:      SpO2: 99% 97%  98%    Wt Readings from Last 3 Encounters:  05/26/16 88.27 kg (194 lb 9.6 oz)  05/25/16 89.812 kg (198 lb)  05/13/16 87.544 kg (193 lb)     Intake/Output Summary (Last 24 hours) at 05/27/16 1543 Last data filed at 05/27/16 1332  Gross per 24 hour  Intake   1400 ml  Output      0 ml  Net   1400 ml     Physical Exam  Gen: not in distress HEENT: no pallor, moist mucosa, supple neck Chest: clear b/l, no added sounds CVS: N S1&S2, no murmurs,  GI: soft, NT, ND, BS+ Musculoskeletal: warm, no edema     Data Review:    CBC  Recent Labs Lab 05/25/16 1539 05/27/16 0343  WBC 8.0 7.8  HGB 11.7* 10.8*  HCT 35.0* 33.5*  PLT 351.0 280  MCV 87.2 90.8  MCH  --  29.3  MCHC 33.3 32.2  RDW 14.9 14.5  LYMPHSABS 1.1  --   MONOABS 0.3  --   EOSABS 0.0  --   BASOSABS 0.0  --     Chemistries   Recent Labs Lab 05/25/16 1539 05/27/16 0343  NA 139 141  K 4.1 3.7  CL 102 109  CO2 33* 27  GLUCOSE 111* 135*  BUN 14 8  CREATININE 0.82 0.58  CALCIUM 8.9 8.7*  AST 12  --   ALT 18  --   ALKPHOS 88  --     BILITOT 0.3  --    ------------------------------------------------------------------------------------------------------------------ No results for input(s): CHOL, HDL, LDLCALC, TRIG, CHOLHDL, LDLDIRECT in the last 72 hours.  No results found for: HGBA1C ------------------------------------------------------------------------------------------------------------------ No results for input(s): TSH, T4TOTAL, T3FREE, THYROIDAB in the last 72 hours.  Invalid input(s): FREET3 ------------------------------------------------------------------------------------------------------------------ No results for input(s): VITAMINB12, FOLATE, FERRITIN, TIBC, IRON, RETICCTPCT in the last 72 hours.  Coagulation profile No results for input(s): INR, PROTIME in the last 168 hours.  No results for input(s): DDIMER in the last 72 hours.  Cardiac Enzymes No results for input(s): CKMB, TROPONINI, MYOGLOBIN in the last 168 hours.  Invalid input(s): CK ------------------------------------------------------------------------------------------------------------------ No results found for: BNP  Inpatient Medications  Scheduled Meds: . glycopyrrolate  2 mg Oral BID  . hydrocortisone  1 enema Rectal BID  . Mesalamine  1,600 mg Oral TID  . pantoprazole  40 mg Oral Daily  . [START ON 05/28/2016] predniSONE  60 mg Oral Q breakfast   Continuous Infusions:  PRN Meds:.acetaminophen, albuterol, diphenoxylate-atropine, ondansetron **OR** ondansetron (ZOFRAN) IV, traMADol, traZODone  Micro Results No results found for this or any previous visit (from the past 240 hour(s)).  Radiology Reports No results found.  Time Spent in minutes  20   Louellen Molder M.D on 05/27/2016 at 3:43  PM  Between 7am to 7pm - Pager - [phone]  After 7pm go to [email or web address] - password [facility]  [facility] -  Office  [phone]

## 2016-05-27 NOTE — Progress Notes (Signed)
Initial Nutrition Assessment  DOCUMENTATION CODES:   Obesity unspecified  INTERVENTION:  Provided pt with recommendations following discharge: At least 2g Fish Oil daily - A recent study has found amounts of up to 4.5g with 2.7g EPA and 1.5g DHA to have positive effects on keeping Ulcerative Colitis in Remission Vitamin D 1000IU daily Calcium 1521m Daily Probiotic - VSL #3  NUTRITION DIAGNOSIS:   Increased nutrient needs related to chronic illness as evidenced by estimated needs.  GOAL:   Patient will meet greater than or equal to 90% of their needs  MONITOR:   PO intake, Labs, I & O's, Skin  REASON FOR ASSESSMENT:   Consult Assessment of nutrition requirement/status  ASSESSMENT:   Melissa ITENis a 50y.o. female with medical history significant for ulcerative colitis diagnosed in her teens and well controlled until earlier this year who is being admitted due to continued UC flare despite outpatient management. She tells me she has been well until earlier this year when she had a UTI as well as a respiratory issue for which she was on antibiotics  Spoke with Melissa Cohen at bedside. She endorses good appetite with good eating habits. At home she was following a high fiber diet until flare-up, when she switched to a low fiber, low residue diet. This did not resolve her issues, and at the recommendation of her GI doc she began to follow a gluten-free, dairy free diet as well. She did not lose weight during this time and states she actually gained weight which is likely related to continued steroid use in treatment of her condition. She states this is the first ulcerative colitis flare up she has had in 30 years. She also states she takes a product called "Juice Plus" which fruit and vegetable concentrates that helps her as well. RD spent some time looking through literature to find recommendations for patient outside of usual low-residue diet. See above.   Labs and  Medications reviewed: Solumedrol, Mesalamine  Diet Order:  Diet regular Room service appropriate?: Yes; Fluid consistency:: Thin  Skin:  Wound (see comment) (Skin tear to L Leg)  Last BM:  6/28  Height:   Ht Readings from Last 1 Encounters:  05/26/16 5' 4"  (1.626 m)    Weight:   Wt Readings from Last 1 Encounters:  05/26/16 194 lb 9.6 oz (88.27 kg)    Ideal Body Weight:  54.54 kg  BMI:  Body mass index is 33.39 kg/(m^2).  Estimated Nutritional Needs:   Kcal:  10312-8118calories  Protein:  90-105 grams  Fluid:  >/= 1.6L  EDUCATION NEEDS:   Education needs addressed  Melissa Anis Terran Hollenkamp, MS, RD LDN Inpatient Clinical Dietitian Pager [phone]

## 2016-05-27 NOTE — Care Management Note (Signed)
Case Management Note  Patient Details  Name: Melissa Cohen MRN: [id number] Date of Birth: November 01, 1966  Subjective/Objective:       49 yo admitted with Ulcerative colitis.             Action/Plan: From home with roommate. Chart reviewed and CM following for DC needs.  Expected Discharge Date:   (unknown)               Expected Discharge Plan:  Home/Self Care  In-House Referral:     Discharge planning Services  CM Consult  Post Acute Care Choice:    Choice offered to:     DME Arranged:    DME Agency:     HH Arranged:    HH Agency:     Status of Service:  In process, will continue to follow  If discussed at Long Length of Stay Meetings, dates discussed:    Additional CommentsLynnell Catalan, RN 05/27/2016, 11:15 AM  [phone]

## 2016-05-28 ENCOUNTER — Telehealth: Payer: Self-pay | Admitting: *Deleted

## 2016-05-28 ENCOUNTER — Other Ambulatory Visit: Payer: Self-pay | Admitting: *Deleted

## 2016-05-28 ENCOUNTER — Encounter: Payer: BLUE CROSS/BLUE SHIELD | Admitting: Gastroenterology

## 2016-05-28 ENCOUNTER — Encounter: Payer: Self-pay | Admitting: Family Medicine

## 2016-05-28 DIAGNOSIS — K51018 Ulcerative (chronic) pancolitis with other complication: Secondary | ICD-10-CM

## 2016-05-28 DIAGNOSIS — F411 Generalized anxiety disorder: Secondary | ICD-10-CM | POA: Diagnosis present

## 2016-05-28 DIAGNOSIS — K5190 Ulcerative colitis, unspecified, without complications: Secondary | ICD-10-CM

## 2016-05-28 LAB — BASIC METABOLIC PANEL
ANION GAP: 4 — AB (ref 5–15)
BUN: 17 mg/dL (ref 6–20)
CALCIUM: 8.7 mg/dL — AB (ref 8.9–10.3)
CO2: 29 mmol/L (ref 22–32)
Chloride: 106 mmol/L (ref 101–111)
Creatinine, Ser: 0.65 mg/dL (ref 0.44–1.00)
GFR calc Af Amer: >60 mL/min (ref >60)
GLUCOSE: 125 mg/dL — AB (ref 65–99)
Potassium: 3.9 mmol/L (ref 3.5–5.1)
Sodium: 139 mmol/L (ref 135–145)

## 2016-05-28 LAB — CBC
HEMATOCRIT: 32.6 % — AB (ref 36.0–46.0)
Hemoglobin: 10.4 g/dL — ABNORMAL LOW (ref 12.0–15.0)
MCH: 29.1 pg (ref 26.0–34.0)
MCHC: 31.9 g/dL (ref 30.0–36.0)
MCV: 91.1 fL (ref 78.0–100.0)
PLATELETS: 281 10*3/uL (ref 150–400)
RBC: 3.58 MIL/uL — ABNORMAL LOW (ref 3.87–5.11)
RDW: 14.7 % (ref 11.5–15.5)
WBC: 7.8 10*3/uL (ref 4.0–10.5)

## 2016-05-28 MED ORDER — VSL#3 PO CAPS: 1.0000 | ORAL_CAPSULE | Freq: Two times a day (BID) | ORAL | Status: DC

## 2016-05-28 MED ORDER — ALPRAZOLAM 0.25 MG PO TABS: 0.2500 mg | ORAL_TABLET | Freq: Two times a day (BID) | ORAL | Status: DC | PRN

## 2016-05-28 MED FILL — ALPRAZolam 0.25 MG TABS: 0.25 | 5 days supply | Qty: 10 | Fill #0

## 2016-05-28 NOTE — Telephone Encounter (Signed)
Transition Care Management Follow-up Telephone Call  PCP: Melissa Asa, MD  Admit date: 05/26/2016 Discharge date: 05/28/2016  Admitted From: Home Disposition: Home  Recommendations for Outpatient Follow-up:  1. Follow up with PCP in 11 week. Follow-up with GI as scheduled.  Home Health:None   Discharge Condition: Fair CODE STATUS: Full Code Diet recommendation: Regular   Discharge Diagnoses:  Acute ulcerative colitis ([place])  Active Problems:  GERD  Anxiety state  --   How have you been since you were released from the hospital? "Well, I mean right now I'm feeling fine. I'm probably going to have to find a bathroom here in a minute."   Do you understand why you were in the hospital? yes   Do you understand the discharge instructions? yes   Where were you discharged to? Home   Items Reviewed:  Medications reviewed: yes  Allergies reviewed: yes  Dietary changes reviewed: yes, low fiber, low residue  Referrals reviewed: yes, outpatient follow-up w/ GI (pt is already established)   Functional Questionnaire:   Activities of Daily Living (ADLs):   She states they are independent in the following: ambulation, bathing and hygiene, feeding, continence, grooming, toileting and dressing States they require assistance with the following: none   Any transportation issues/concerns?: no   Any patient concerns? yes, hospitalist did not write rx for Librax prior to discharge because he had already left for the day by the time pt received MyChart reply from Dr. Birdie Cohen. Pt would like to know if Dr. Birdie Cohen would be willing to write this script for her and send in today. If not, she would like something for anxiety to hold over until appt (already has 5 day script for Xanax from hospital). [name], otherwise, [name]. Please advise pt.    Confirmed importance and date/time of follow-up visits scheduled  yes  Provider Appointment booked with Dr. Annye Cohen 06/04/16 @ 11am, pt instructed to arrive by 10:45am  Confirmed with patient if condition begins to worsen call PCP or go to the ER.  Patient was given the office number and encouraged to call back with question or concerns.  : yes

## 2016-05-28 NOTE — Telephone Encounter (Signed)
Unable to reach patient at time of TCM Call.  Left message for patient to return call when available, and that MyChart appt will need to be rescheduled (as it is a 15 min acute appt, not HFU)  Per Dr. Birdie Riddle, okay to schedule pt 06/03/16 @ 11am, pt must arrive at 10:45am.

## 2016-05-28 NOTE — Progress Notes (Signed)
Progress Note   Subjective  Melissa Cohen is a 50 year old Caucasian female who was admitted to the hospital on 05/26/16 for IV steroids and her first Remicade infusion for ulcerative colitis flare.  This morning, the patient is found sitting up in bed, she has been able to tolerate a regular diet for the past 3 meals and tells me that she has seen no more bright red blood in her stools, which are starting to look more like "pea soup", then "water". The patient's face and body are no longer flushed and she tells me she did receive her first dose of by mouth prednisone this morning. The patient does request that we send in a prescription for VSL #3 before she goes home. She tells me she is feeling as if she can be discharged at this time. She denies any new GI complaints or concerns.   Objective   Vital signs in last 24 hours: Temp:  [98.1 F (36.7 C)-98.9 F (37.2 C)] 98.1 F (36.7 C) (06/30 0538) Pulse Rate:  [68-80] 68 (06/30 0538) Resp:  [15-16] 16 (06/30 0538) BP: (88-110)/(69-77) 88/69 mmHg (06/30 0538) SpO2:  [97 %-99 %] 97 % (06/30 0538) Last BM Date: 05/27/16 General: Caucasian female in NAD Heart:  Regular rate and rhythm; no murmurs Lungs: Respirations even and unlabored, lungs CTA bilaterally Abdomen:  Soft, Mild ttp across epigastrum and nondistended. Normal bowel sounds. Extremities:  Without edema. Neurologic:  Alert and oriented,  grossly normal neurologically. Psych:  Cooperative. Normal mood and affect.  Intake/Output from previous day: 06/29 0701 - 06/30 0700 In: 2015 [P.O.:2015] Out: -   Lab Results:  Recent Labs  05/25/16 1539 05/27/16 0343 05/28/16 0322  WBC 8.0 7.8 7.8  HGB 11.7* 10.8* 10.4*  HCT 35.0* 33.5* 32.6*  PLT 351.0 280 281   BMET  Recent Labs  05/25/16 1539 05/27/16 0343 05/28/16 0322  NA 139 141 139  K 4.1 3.7 3.9  CL 102 109 106  CO2 33* 27 29  GLUCOSE 111* 135* 125*  BUN 14 8 17   CREATININE 0.82 0.58 0.65  CALCIUM 8.9  8.7* 8.7*   LFT  Recent Labs  05/25/16 1539  PROT 6.8  ALBUMIN 3.7  AST 12  ALT 18  ALKPHOS 88  BILITOT 0.3   PT/INR No results for input(s): LABPROT, INR in the last 72 hours.  Studies/Results: No results found.     Assessment / Plan:   Impression: 1. Ulcerative Colitis Flare: pt has not responded to outpatient therapy including Prednisone 60 mg daily, mesalamine 2.4 g daily, hydrocortisone enemas hs and Robinul twice a day which was initiated 05/13/16-Quanitferon gold test neg sept 2016, hep b surf ag negative 05/25/16- First remicade infusion completed 05/26/16-Pt now back on PO steroids 63m and tolerating a regular diet-OK for discharge home today  Plan: 1. Continue Prednisone 653mPO qd 2. Continue other Gi meds as prescribed, will discuss titration/change at time of follow up with Dr. StFuller Plan. Patient aware of next Remicade infusion appt and time of follow up appt 4. VSL #3 Prescription added to discharge orders, pt would liked this printed out at time of d/c to take with her to pharmacy 5. Will discuss above with Dr. NaSilverio Decampwe will sign off.  Thank you for your kind consultation, we will sign off.   Active Problems:   GERD   [place]ative [place]  Col[place]    LOS: 2 days   JeLevin Erp6/30/2017, 10:15 AM  Pager # [phone]

## 2016-05-28 NOTE — Discharge Summary (Signed)
Physician Discharge Summary  Melissa Cohen KZL:[id number] DOB: [birth year] DOA: 05/26/2016  PCP: Annye Asa, MD  Admit date: 05/26/2016 Discharge date: 05/28/2016  Admitted From: Home Disposition:  Home  Recommendations for Outpatient Follow-up:  1. Follow up with PCP in 11 week. Follow-up with GI as scheduled.  Home Health:None   Discharge Condition: Fair CODE STATUS: Full Code Diet recommendation: Regular   Discharge Diagnoses:  Acute ulcerative colitis ([place])  Active Problems:   GERD   Anxiety state  History of present illness 50 y.o. female with ulcerative colitis diagnosed in her teens, well controlled until earlier this year was admitted due to continued UC flare despite outpatient management. . She scheduled an elective colonoscopy with Dr. Fuller Plan at that time. While she awaited this, she started to develop abdominal cramping, diarrhea and worsening blood in the stool over the course of the next couple of months. She saw a provider in the GI office in late May and was started on Prednisone 66m PO daily as well as mesalamine 8067mPO TID. She had a colonoscopy with Dr. DaLoletha Carrown 6/15 which showed severe left sided colitis and right sided scarring.during follow up with Dr. StFuller Plann 6/27, he recommended hospital admission for IV steroids and initiation of Remicade. d hospital admission for IV steroids and initiation of Remicade.   Hospital course  acute on chronic ulcerative colitis Failed outpt therapy. Given IV remicade on 6/28. Had some flushing of face but no pruritis. Continue home dose prednisone, hydrocortisone enema and Lomotil. Tolerating diet and abdominal pain much better. GI recommend to continue oral prednisone upon discharge. Appointment scheduled for outpatient Remicade infusion.  GERD PPI  Anxiety symptoms reports being more anxious regarding her illness and personal stress. Have prescribed a short duration of low dose Xanax (0.25 mg twice a  day as needed). Needs to follow-up with PCP further.    Discharge Instructions     Medication List    TAKE these medications        acetaminophen 500 MG tablet  Commonly known as:  TYLENOL  Take 1,000 mg by mouth every 6 (six) hours as needed for headache.     albuterol 108 (90 Base) MCG/ACT inhaler  Commonly known as:  PROVENTIL HFA;VENTOLIN HFA  Inhale 2 puffs into the lungs every 6 (six) hours as needed for wheezing or shortness of breath.     ALPRAZolam 0.25 MG tablet  Commonly known as:  XANAX  Take 1 tablet (0.25 mg total) by mouth 2 (two) times daily as needed for anxiety.     diphenoxylate-atropine 2.5-0.025 MG tablet  Commonly known as:  LOMOTIL  Take 2 tablets by mouth every 8 (eight) hours as needed for diarrhea or loose stools.     esomeprazole 40 MG capsule  Commonly known as:  NEXIUM  TAKE 1 CAPSULE BY MOUTH ONCE DAILY     glycopyrrolate 2 MG tablet  Commonly known as:  ROBINUL  Take 1 tablet (2 mg total) by mouth 2 (two) times daily.     hydrocortisone 100 MG/60ML enema  Commonly known as:  CORTENEMA  Place 1 enema (100 mg total) rectally 2 (two) times daily.     Mesalamine 400 MG Cpdr DR capsule  Commonly known as:  DELZICOL  Take 4 capsules (1,600 mg total) by mouth 3 (three) times daily.     OVER THE COUNTER MEDICATION  Take 1 tablet by mouth daily. Juice plus     predniSONE 10 MG tablet  Commonly known as:  DELTASONE  Take 60 mg by mouth for 3 weeks and as directed     ranitidine 300 MG tablet  Commonly known as:  ZANTAC  Take 1 tablet (300 mg total) by mouth at bedtime.     traMADol 50 MG tablet  Commonly known as:  ULTRAM  Take 1 tablet (50 mg total) by mouth every 8 (eight) hours as needed.     traZODone 50 MG tablet  Commonly known as:  DESYREL  Take 0.5-1 tablets (25-50 mg total) by mouth at bedtime as needed for sleep.     VSL#3 Caps  Take 1 capsule by mouth 2 (two) times daily.           Follow-up Information    Follow  up with Annye Asa, MD. Schedule an appointment as soon as possible for a visit in 1 week.   Specialty:  Family Medicine   Contact information:   [address] [place] [address] [phone]      Allergies  Allergen Reactions  . Cimetidine     REACTION: anaphalactic shock  . Escitalopram Oxalate     Caused ankle swelling   . Mobic [Meloxicam] Swelling    Consultations:  [facility]   Procedures/Studies: None   Subjective: Symptoms much improved. Occasional abdominal cramping. No diarrhea  Discharge Exam: Filed Vitals:   05/27/16 2152 05/28/16 0538  BP: 105/70 88/69  Pulse: 79 68  Temp: 98.1 F (36.7 C) 98.1 F (36.7 C)  Resp: 16 16   Filed Vitals:   05/27/16 0856 05/27/16 1331 05/27/16 2152 05/28/16 0538  BP:  110/77 105/70 88/69  Pulse:  80 79 68  Temp: 98.3 F (36.8 C) 98.9 F (37.2 C) 98.1 F (36.7 C) 98.1 F (36.7 C)  TempSrc: Oral Oral Oral Oral  Resp:  15 16 16   Height:      Weight:      SpO2:  98% 99% 97%    Gen: not in distress HEENT: no pallor, moist mucosa, supple neck Chest: clear b/l, no added sounds CVS: N S1&S2, no murmurs,  GI: soft, NT, ND, BS+ Musculoskeletal: warm, no edema    The results of significant diagnostics from this hospitalization (including imaging, microbiology, ancillary and laboratory) are listed below for reference.     Microbiology: No results found for this or any previous visit (from the past 240 hour(s)).   Labs: BNP (last 3 results) No results for input(s): BNP in the last 8760 hours. Basic Metabolic Panel:  Recent Labs Lab 05/25/16 1539 05/27/16 0343 05/28/16 0322  NA 139 141 139  K 4.1 3.7 3.9  CL 102 109 106  CO2 33* 27 29  GLUCOSE 111* 135* 125*  BUN 14 8 17   CREATININE 0.82 0.58 0.65  CALCIUM 8.9 8.7* 8.7*   Liver Function Tests:  Recent Labs Lab 05/25/16 1539  AST 12  ALT 18  ALKPHOS 88  BILITOT 0.3  PROT 6.8  ALBUMIN 3.7   No results for input(s): LIPASE,  AMYLASE in the last 168 hours. No results for input(s): AMMONIA in the last 168 hours. CBC:  Recent Labs Lab 05/25/16 1539 05/27/16 0343 05/28/16 0322  WBC 8.0 7.8 7.8  NEUTROABS 6.5  --   --   HGB 11.7* 10.8* 10.4*  HCT 35.0* 33.5* 32.6*  MCV 87.2 90.8 91.1  PLT 351.0 280 281   Cardiac Enzymes: No results for input(s): CKTOTAL, CKMB, CKMBINDEX, TROPONINI in the last 168 hours. BNP: Invalid input(s): POCBNP CBG:  No results for input(s): GLUCAP in the last 168 hours. D-Dimer No results for input(s): DDIMER in the last 72 hours. Hgb A1c No results for input(s): HGBA1C in the last 72 hours. Lipid Profile No results for input(s): CHOL, HDL, LDLCALC, TRIG, CHOLHDL, LDLDIRECT in the last 72 hours. Thyroid function studies No results for input(s): TSH, T4TOTAL, T3FREE, THYROIDAB in the last 72 hours.  Invalid input(s): FREET3 Anemia work up No results for input(s): VITAMINB12, FOLATE, FERRITIN, TIBC, IRON, RETICCTPCT in the last 72 hours. Urinalysis    Component Value Date/Time   COLORURINE yellow 07/14/2010 0954   APPEARANCEUR Clear 07/14/2010 0954   LABSPEC <1.005 07/14/2010 0954   PHURINE 6.0 07/14/2010 0954   GLUCOSEU NEG mg/dL 03/28/2007 2321   HGBUR trace-intact 07/14/2010 0954   BILIRUBINUR Neg 12/18/2015 1604   BILIRUBINUR negative 07/14/2010 0954   KETONESUR NEG mg/dL 03/28/2007 2321   PROTEINUR Neg 12/18/2015 1604   PROTEINUR NEG mg/dL 03/28/2007 2321   UROBILINOGEN 4.0 12/18/2015 1604   UROBILINOGEN 0.2 07/14/2010 0954   NITRITE Neg 12/18/2015 1604   NITRITE negative 07/14/2010 0954   LEUKOCYTESUR moderate (2+)* 12/18/2015 1604   Sepsis Labs Invalid input(s): PROCALCITONIN,  WBC,  LACTICIDVEN Microbiology No results found for this or any previous visit (from the past 240 hour(s)).   Time coordinating discharge: <30 minutes  SIGNED:   Louellen Molder, MD  [facility] 05/28/2016, 10:57 AM Pager   If 7PM-7AM, please contact  night-coverage [email or web address] Password [email or web address]

## 2016-05-31 MED FILL — ESOMEPRAZOLE MAG DR 40 MG C: 40 | 30 days supply | Qty: 30 | Fill #2

## 2016-05-31 MED FILL — raNITIdine HCL 300 MG TABS: 300 | 30 days supply | Qty: 30 | Fill #2

## 2016-06-02 ENCOUNTER — Ambulatory Visit: Payer: Self-pay | Admitting: Family Medicine

## 2016-06-03 ENCOUNTER — Encounter: Payer: Self-pay | Admitting: Family Medicine

## 2016-06-03 ENCOUNTER — Ambulatory Visit (INDEPENDENT_AMBULATORY_CARE_PROVIDER_SITE_OTHER): Payer: BLUE CROSS/BLUE SHIELD | Admitting: Family Medicine

## 2016-06-03 ENCOUNTER — Other Ambulatory Visit: Payer: Self-pay | Admitting: General Practice

## 2016-06-03 VITALS — BP 123/84 | HR 89 | Temp 98.5°F | Resp 16 | Ht 64.0 in | Wt 198.1 lb

## 2016-06-03 DIAGNOSIS — K51011 Ulcerative (chronic) pancolitis with rectal bleeding: Secondary | ICD-10-CM | POA: Diagnosis not present

## 2016-06-03 DIAGNOSIS — F329 Major depressive disorder, single episode, unspecified: Secondary | ICD-10-CM

## 2016-06-03 DIAGNOSIS — Z5189 Encounter for other specified aftercare: Secondary | ICD-10-CM | POA: Diagnosis not present

## 2016-06-03 DIAGNOSIS — F32A Depression, unspecified: Secondary | ICD-10-CM

## 2016-06-03 DIAGNOSIS — F418 Other specified anxiety disorders: Secondary | ICD-10-CM | POA: Diagnosis not present

## 2016-06-03 DIAGNOSIS — F419 Anxiety disorder, unspecified: Secondary | ICD-10-CM

## 2016-06-03 MED ORDER — ALPRAZOLAM 0.25 MG PO TABS: 0.2500 mg | ORAL_TABLET | Freq: Two times a day (BID) | ORAL | Status: DC | PRN

## 2016-06-03 MED ORDER — SERTRALINE HCL 50 MG PO TABS: 50.0000 mg | ORAL_TABLET | Freq: Every day | ORAL | Status: DC

## 2016-06-03 NOTE — Progress Notes (Signed)
   Subjective:    Patient ID: Melissa Cohen, female    DOB: 1966-02-11, 50 y.o.   MRN: [id number]  [facility] f/u- pt was admitted 6/28-30 w/ acute UC flare.  Colonoscopy showed active inflammation from anus to splenic flexure.  Pt is now on Prednisone, Mesalamine (pt is holding due to diarrhea and bloating), and on Remicade per GI.  Has f/u w/ Dr Fuller Plan scheduled.  She reports feeling better from a GI standpoint but is tearful and very stressed out about her health.  She is upset about restarting prednisone.  She was started on Alprazolam 0.72m BID prn.     Review of Systems For ROS see HPI     Objective:   Physical Exam  Constitutional: She is oriented to person, place, and time. She appears well-developed and well-nourished. No distress.  HENT:  Head: Normocephalic and atraumatic.  Abdominal: Soft. Bowel sounds are normal. She exhibits no distension. There is no tenderness. There is no rebound and no guarding.  Neurological: She is alert and oriented to person, place, and time.  Skin: Skin is warm and dry.  Psychiatric: Her behavior is normal. Thought content normal.  Tearful, anxious  Vitals reviewed.         Assessment & Plan:

## 2016-06-03 NOTE — Progress Notes (Signed)
Pre visit review using our clinic review tool, if applicable. No additional management support is needed unless otherwise documented below in the visit note. 

## 2016-06-03 NOTE — Patient Instructions (Signed)
Follow up in 1 month to recheck anxiety Start the low dose Sertraline daily.  Start w/ 1/2 tab x6 days and then increase to 1 tab daily. Use the Alprazolam as needed for anxiety Call with any questions or concerns Hang in there!!!  You've got this!!!

## 2016-06-04 MED FILL — SERTRALINE HCL 50 MG TABLET: 50 | 30 days supply | Qty: 30 | Fill #0

## 2016-06-04 MED FILL — ALPRAZolam 0.25 MG TABS: 0.25 | 15 days supply | Qty: 30 | Fill #0

## 2016-06-07 ENCOUNTER — Encounter: Payer: Self-pay | Admitting: Gastroenterology

## 2016-06-07 ENCOUNTER — Ambulatory Visit (INDEPENDENT_AMBULATORY_CARE_PROVIDER_SITE_OTHER): Payer: BLUE CROSS/BLUE SHIELD | Admitting: Gastroenterology

## 2016-06-07 ENCOUNTER — Ambulatory Visit: Payer: BLUE CROSS/BLUE SHIELD | Admitting: Gastroenterology

## 2016-06-07 VITALS — BP 106/78 | HR 100 | Ht 64.0 in | Wt 198.6 lb

## 2016-06-07 DIAGNOSIS — K51011 Ulcerative (chronic) pancolitis with rectal bleeding: Secondary | ICD-10-CM | POA: Diagnosis not present

## 2016-06-07 MED ORDER — DIPHENOXYLATE-ATROPINE 2.5-0.025 MG PO TABS: 1.0000 | ORAL_TABLET | Freq: Three times a day (TID) | ORAL | Status: DC | PRN

## 2016-06-07 NOTE — Progress Notes (Signed)
    History of Present Illness: This is a 50 year old female with ulcerative colitis falre returning for follow-up. She was hospitalized from 6/28 to 6/30. She received intravenous corticosteroids and her first dose of Remicade on June 28. Since discharge from the hospital she has continued to improve. She is now having 1-2 formed, non blood bowel movements daily. She has no abdominal pain. She forgot to take her mesalamine 1 day and noted she was having much less diarrhea so she remained off mesalamine. She has tapered her prednisone dose on her home without our advice to 30 mg daily. She tapered her dose of Robinul to 1 mg twice daily as needed.  Current Medications, Allergies, Past Medical History, Past Surgical History, Family History and Social History were reviewed in [employer] record.  Physical Exam: General: Well developed, well nourished, no acute distress Head: Normocephalic and atraumatic Eyes:  sclerae anicteric, EOMI Ears: Normal auditory acuity Mouth: No deformity or lesions Lungs: Clear throughout to auscultation Heart: Regular rate and rhythm; no murmurs, rubs or bruits Abdomen: Soft, non tender and non distended. No masses, hepatosplenomegaly or hernias noted. Normal Bowel sounds Musculoskeletal: Symmetrical with no gross deformities  Pulses:  Normal pulses noted Extremities: No clubbing, cyanosis, edema or deformities noted Neurological: Alert oriented x 4, grossly nonfocal Psychological:  Alert and cooperative. Normal mood and affect  Assessment and Recommendations:

## 2016-06-07 NOTE — Assessment & Plan Note (Addendum)
Markedly improved. 1st dose Remicade on 6/28 as inpatient. Remicade 2 week dose is scheduled on 7/12 and 6 week dose on 8/15. DC mesalamine due to diarrhea side effect. Taper Prednisone from current dose of 30 mg by 5 mg per week until discontinued as outlined in pt instructions. Advised to follow MD instructions on medication dosing, especially with Prednisone and with Remicade infusions. Continue Robinul 1 mg po bid prn. Continue Lomotil bid prn. Blood work at next REV. Long term mgmt discussed with goal of only Remicade. Discussed possibly repeating colonoscopy in 6-12 months to reassess UC. I spent 25 minutes of face-to-face time with the patient. Greater than 50% of the time was spent counseling and coordinating care. REV in 1 month.

## 2016-06-07 NOTE — Patient Instructions (Signed)
Starting Saturday decrease your prednisone to 25 mg x 1 week, then 20 mg x 1 week, 15 mg x 1 week, 10 mg x 1 week, 5 mg x 1 week, 5 mg every other day x 1 week, then stop.  Continue current medications.   Thank you for choosing me and [facility].  Pricilla Riffle. Dagoberto Ligas., MD., Marval Regal

## 2016-06-09 ENCOUNTER — Ambulatory Visit: Payer: BLUE CROSS/BLUE SHIELD | Admitting: Gastroenterology

## 2016-06-09 ENCOUNTER — Encounter (HOSPITAL_COMMUNITY): Payer: Self-pay

## 2016-06-09 ENCOUNTER — Ambulatory Visit (HOSPITAL_COMMUNITY)
Admit: 2016-06-09 | Discharge: 2016-06-09 | Disposition: A | Discharge status: Home / Self Care | Payer: BLUE CROSS/BLUE SHIELD | Source: Ambulatory Visit | Attending: Gastroenterology | Admitting: Gastroenterology

## 2016-06-09 VITALS — BP 111/68 | HR 80 | Temp 97.9°F | Resp 16 | Ht 64.0 in | Wt 198.6 lb

## 2016-06-09 DIAGNOSIS — K5190 Ulcerative colitis, unspecified, without complications: Secondary | ICD-10-CM | POA: Diagnosis not present

## 2016-06-09 MED ORDER — DIPHENHYDRAMINE HCL 25 MG PO CAPS
25.0000 mg | ORAL_CAPSULE | Freq: Once | ORAL | Status: AC
  Administered 2016-06-09: 25 mg via ORAL
  Filled 2016-06-09: qty 1

## 2016-06-09 MED ORDER — INFLIXIMAB 100 MG IV SOLR
5.0000 mg/kg | Freq: Once | INTRAVENOUS | Status: AC
  Administered 2016-06-09: 400 mg via INTRAVENOUS
  Filled 2016-06-09: qty 40

## 2016-06-09 MED ORDER — ACETAMINOPHEN 325 MG PO TABS
650.0000 mg | ORAL_TABLET | Freq: Once | ORAL | Status: AC
  Administered 2016-06-09: 650 mg via ORAL
  Filled 2016-06-09: qty 2

## 2016-06-09 MED ORDER — SODIUM CHLORIDE 0.9 % IV SOLN
INTRAVENOUS | Status: DC
  Administered 2016-06-09: 13:00:00 via INTRAVENOUS

## 2016-06-09 MED FILL — traZODone HCL 50 MG TABS: 50 | 30 days supply | Qty: 30 | Fill #3

## 2016-06-09 NOTE — Progress Notes (Signed)
Pt. Tolerated 2nd infusion of Remicade, no problems voiced from pt., pt. To follow-up with doctor with any problems.

## 2016-06-09 NOTE — Discharge Instructions (Signed)
Infliximab injection What is this medicine? INFLIXIMAB (in Ingram i mab) is used to treat Crohn's disease and ulcerative colitis. It is also used to treat ankylosing spondylitis, psoriasis, and some forms of arthritis. This medicine may be used for other purposes; ask your health care provider or pharmacist if you have questions. What should I tell my health care provider before I take this medicine? They need to know if you have any of these conditions: -diabetes -exposure to tuberculosis -heart failure -hepatitis or liver disease -immune system problems -infection -lung or breathing disease, like COPD -multiple sclerosis -current or past resident of [place] or [place] -seizure disorder -an unusual or allergic reaction to infliximab, mouse proteins, other medicines, foods, dyes, or preservatives -pregnant or trying to get pregnant -breast-feeding How should I use this medicine? This medicine is for injection into a vein. It is usually given by a health care professional in a hospital or clinic setting. A special MedGuide will be given to you by the pharmacist with each prescription and refill. Be sure to read this information carefully each time. Talk to your pediatrician regarding the use of this medicine in children. Special care may be needed. Overdosage: If you think you have taken too much of this medicine contact a poison control center or emergency room at once. NOTE: This medicine is only for you. Do not share this medicine with others. What if I miss a dose? It is important not to miss your dose. Call your doctor or health care professional if you are unable to keep an appointment. What may interact with this medicine? Do not take this medicine with any of the following medications: -anakinra -rilonacept This medicine may also interact with the following medications: -vaccines This list may not describe all possible interactions. Give your health care provider  a list of all the medicines, herbs, non-prescription drugs, or dietary supplements you use. Also tell them if you smoke, drink alcohol, or use illegal drugs. Some items may interact with your medicine. What should I watch for while using this medicine? Visit your doctor or health care professional for regular checks on your progress. If you get a cold or other infection while receiving this medicine, call your doctor or health care professional. Do not treat yourself. This medicine may decrease your body's ability to fight infections. Before beginning therapy, your doctor may do a test to see if you have been exposed to tuberculosis. This medicine may make the symptoms of heart failure worse in some patients. If you notice symptoms such as increased shortness of breath or swelling of the ankles or legs, contact your health care provider right away. If you are going to have surgery or dental work, tell your health care professional or dentist that you have received this medicine. If you take this medicine for plaque psoriasis, stay out of the sun. If you cannot avoid being in the sun, wear protective clothing and use sunscreen. Do not use sun lamps or tanning beds/booths. What side effects may I notice from receiving this medicine? Side effects that you should report to your doctor or health care professional as soon as possible: -allergic reactions like skin rash, itching or hives, swelling of the face, lips, or tongue -chest pain -fever or chills, usually related to the infusion -muscle or joint pain -red, scaly patches or raised bumps on the skin -signs of infection - fever or chills, cough, sore throat, pain or difficulty passing urine -swollen lymph nodes in the neck, underarm,  or groin areas -unexplained weight loss -unusual bleeding or bruising -unusually weak or tired -yellowing of the eyes or skin Side effects that usually do not require medical attention (report to your doctor or health  care professional if they continue or are bothersome): -headache -heartburn or stomach pain -nausea, vomiting This list may not describe all possible side effects. Call your doctor for medical advice about side effects. You may report side effects to FDA at 1-800-FDA-1088. Where should I keep my medicine? This drug is given in a hospital or clinic and will not be stored at home. NOTE: This sheet is a summary. It may not cover all possible information. If you have questions about this medicine, talk to your doctor, pharmacist, or health care provider.    2016, Elsevier/Gold Standard. (2008-07-03 10:26:02)

## 2016-06-21 NOTE — Assessment & Plan Note (Signed)
Deteriorated.  Pt is rightfully anxious and upset about her current UC situation.  She was started on Alprazolam while hospitalized but continues to be tearful and anxious.  Will start daily SSRI and monitor for improvement.  Pt expressed understanding and is in agreement w/ plan.

## 2016-06-21 NOTE — Assessment & Plan Note (Signed)
Deteriorated.  Reviewed recent d/c summary and hospital notes.  Pt is now on Remicade per GI.  Has f/u w/ Dr Fuller Plan scheduled.  She reports her GI issues are much improved.  Will follow along and assist as able.

## 2016-06-25 MED FILL — raNITIdine HCL 300 MG TABS: 300 | 30 days supply | Qty: 30 | Fill #3

## 2016-06-25 MED FILL — ESOMEPRAZOLE MAG DR 40 MG C: 40 | 30 days supply | Qty: 30 | Fill #3

## 2016-06-28 MED FILL — SERTRALINE HCL 50 MG TABLET: 50 | 30 days supply | Qty: 30 | Fill #1

## 2016-07-12 ENCOUNTER — Other Ambulatory Visit: Payer: Self-pay

## 2016-07-12 ENCOUNTER — Other Ambulatory Visit (INDEPENDENT_AMBULATORY_CARE_PROVIDER_SITE_OTHER): Payer: BLUE CROSS/BLUE SHIELD

## 2016-07-12 ENCOUNTER — Ambulatory Visit (INDEPENDENT_AMBULATORY_CARE_PROVIDER_SITE_OTHER): Payer: BLUE CROSS/BLUE SHIELD | Admitting: Gastroenterology

## 2016-07-12 ENCOUNTER — Encounter: Payer: Self-pay | Admitting: Gastroenterology

## 2016-07-12 DIAGNOSIS — K51011 Ulcerative (chronic) pancolitis with rectal bleeding: Secondary | ICD-10-CM | POA: Diagnosis not present

## 2016-07-12 DIAGNOSIS — K51911 Ulcerative colitis, unspecified with rectal bleeding: Secondary | ICD-10-CM

## 2016-07-12 DIAGNOSIS — K51918 Ulcerative colitis, unspecified with other complication: Secondary | ICD-10-CM

## 2016-07-12 LAB — CBC WITH DIFFERENTIAL/PLATELET
Basophils Absolute: 0 10*3/uL (ref 0.0–0.1)
Basophils Relative: 0.5 % (ref 0.0–3.0)
EOS PCT: 1.8 % (ref 0.0–5.0)
Eosinophils Absolute: 0.1 10*3/uL (ref 0.0–0.7)
HEMATOCRIT: 34.1 % — AB (ref 36.0–46.0)
Hemoglobin: 11.4 g/dL — ABNORMAL LOW (ref 12.0–15.0)
LYMPHS ABS: 4 10*3/uL (ref 0.7–4.0)
Lymphocytes Relative: 55.8 % — ABNORMAL HIGH (ref 12.0–46.0)
MCHC: 33.3 g/dL (ref 30.0–36.0)
MCV: 80.9 fl (ref 78.0–100.0)
MONOS PCT: 7.2 % (ref 3.0–12.0)
Monocytes Absolute: 0.5 10*3/uL (ref 0.1–1.0)
NEUTROS ABS: 2.5 10*3/uL (ref 1.4–7.7)
NEUTROS PCT: 34.7 % — AB (ref 43.0–77.0)
PLATELETS: 343 10*3/uL (ref 150.0–400.0)
RBC: 4.21 Mil/uL (ref 3.87–5.11)
RDW: 14.9 % (ref 11.5–15.5)
WBC: 7.2 10*3/uL (ref 4.0–10.5)

## 2016-07-12 LAB — COMPREHENSIVE METABOLIC PANEL
ALBUMIN: 3.8 g/dL (ref 3.5–5.2)
ALT: 38 U/L — AB (ref 0–35)
AST: 47 U/L — AB (ref 0–37)
Alkaline Phosphatase: 76 U/L (ref 39–117)
BUN: 10 mg/dL (ref 6–23)
CHLORIDE: 107 meq/L (ref 96–112)
CO2: 27 meq/L (ref 19–32)
CREATININE: 0.74 mg/dL (ref 0.40–1.20)
Calcium: 9.2 mg/dL (ref 8.4–10.5)
GFR: 87.58 mL/min (ref >60.00)
Glucose, Bld: 105 mg/dL — ABNORMAL HIGH (ref 70–99)
Potassium: 3.9 mEq/L (ref 3.5–5.1)
SODIUM: 142 meq/L (ref 135–145)
Total Bilirubin: 0.2 mg/dL (ref 0.2–1.2)
Total Protein: 6.8 g/dL (ref 6.0–8.3)

## 2016-07-12 LAB — HIGH SENSITIVITY CRP: CRP, High Sensitivity: 2.33 mg/L (ref 0.000–5.000)

## 2016-07-12 LAB — SEDIMENTATION RATE: SED RATE: 15 mm/h (ref 0–30)

## 2016-07-12 MED ORDER — DIPHENOXYLATE-ATROPINE 2.5-0.025 MG PO TABS: 1.0000 | ORAL_TABLET | Freq: Two times a day (BID) | ORAL | 5 refills | Status: DC

## 2016-07-12 MED FILL — DIPHENOXYLATE-ATROPINE TAB: 2.5-0.025 | 30 days supply | Qty: 60 | Fill #0

## 2016-07-12 NOTE — Assessment & Plan Note (Addendum)
Continues to improve. 1st dose Remicade on 6/28 as inpatient. 2nd dose of Remicade on 7/12 and next dose is due on 8/15, tomorrow. Tapered off Prednisone about 2 weeks. Again advised to follow MD instructions on medication dosing, especially with Prednisone and with Remicade infusions. Continue Robinul 1 mg po bid prn. Continue Lomotil bid prn. CBC, CMP, ESR, CRP today. Long term mgmt discussed. Discussed possibly repeating colonoscopy in 6-12 months to reassess UC. I spent 15 minutes of face-to-face time with the patient. Greater than 50% of the time was spent counseling and coordinating care. REV in 2 months.

## 2016-07-12 NOTE — Patient Instructions (Signed)
Go to the basement for labs today We will send Lomotil to your pharmacy Continue with your Remicade treatment tomorrow

## 2016-07-12 NOTE — Progress Notes (Signed)
    History of Present Illness: This is a 50 year old female with ulcerative colitis who has received 2 doses of Remicade with her third dose to tomorrow. She tapered off prednisone completely about 2 weeks ago. She is still having intermittent mild diarrhea which is easily controlled with Lomotil. She has not needed to use Robinul. She notes no abdominal pain or rectal bleeding. She is eating a normal diet without difficulty. Overall she feels substantially improved.  Current Medications, Allergies, Past Medical History, Past Surgical History, Family History and Social History were reviewed in [employer] record.  Physical Exam: General: Well developed, well nourished, no acute distress Head: Normocephalic and atraumatic Eyes:  sclerae anicteric, EOMI Ears: Normal auditory acuity Mouth: No deformity or lesions Lungs: Clear throughout to auscultation Heart: Regular rate and rhythm; no murmurs, rubs or bruits Abdomen: Soft, non tender and non distended. No masses, hepatosplenomegaly or hernias noted. Normal Bowel sounds Musculoskeletal: Symmetrical with no gross deformities  Pulses:  Normal pulses noted Extremities: No clubbing, cyanosis, edema or deformities noted Neurological: Alert oriented x 4, grossly nonfocal Psychological:  Alert and cooperative. Normal mood and affect  Assessment and Recommendations:

## 2016-07-13 ENCOUNTER — Encounter (HOSPITAL_COMMUNITY): Payer: Self-pay

## 2016-07-13 ENCOUNTER — Encounter (HOSPITAL_COMMUNITY)
Admit: 2016-07-13 | Discharge: 2016-07-13 | Disposition: A | Discharge status: Home / Self Care | Payer: BLUE CROSS/BLUE SHIELD | Source: Ambulatory Visit | Attending: Gastroenterology | Admitting: Gastroenterology

## 2016-07-13 DIAGNOSIS — K51911 Ulcerative colitis, unspecified with rectal bleeding: Secondary | ICD-10-CM

## 2016-07-13 MED ORDER — SODIUM CHLORIDE 0.9 % IV SOLN
INTRAVENOUS | Status: DC
  Administered 2016-07-13: 08:00:00 via INTRAVENOUS

## 2016-07-13 MED ORDER — SODIUM CHLORIDE 0.9 % IV SOLN
5.0000 mg/kg | INTRAVENOUS | Status: DC
  Administered 2016-07-13: 500 mg via INTRAVENOUS
  Filled 2016-07-13: qty 50

## 2016-07-13 MED ORDER — ACETAMINOPHEN 325 MG PO TABS
650.0000 mg | ORAL_TABLET | Freq: Every day | ORAL | Status: DC
  Administered 2016-07-13: 650 mg via ORAL
  Filled 2016-07-13: qty 2

## 2016-07-13 MED ORDER — DIPHENHYDRAMINE HCL 25 MG PO CAPS
50.0000 mg | ORAL_CAPSULE | Freq: Every day | ORAL | Status: DC
  Administered 2016-07-13: 50 mg via ORAL
  Filled 2016-07-13: qty 2

## 2016-07-13 NOTE — Discharge Instructions (Signed)
Infliximab injection What is this medicine? INFLIXIMAB (in [place] i mab) is used to treat Crohn's disease and ulcerative colitis. It is also used to treat ankylosing spondylitis, psoriasis, and some forms of arthritis. This medicine may be used for other purposes; ask your health care provider or pharmacist if you have questions. What should I tell my health care provider before I take this medicine? They need to know if you have any of these conditions: -diabetes -exposure to tuberculosis -heart failure -hepatitis or liver disease -immune system problems -infection -lung or breathing disease, like COPD -multiple sclerosis -current or past resident of [place] or [place] -seizure disorder -an unusual or allergic reaction to infliximab, mouse proteins, other medicines, foods, dyes, or preservatives -pregnant or trying to get pregnant -breast-feeding How should I use this medicine? This medicine is for injection into a vein. It is usually given by a health care professional in a hospital or clinic setting. A special MedGuide will be given to you by the pharmacist with each prescription and refill. Be sure to read this information carefully each time. Talk to your pediatrician regarding the use of this medicine in children. Special care may be needed. Overdosage: If you think you have taken too much of this medicine contact a poison control center or emergency room at once. NOTE: This medicine is only for you. Do not share this medicine with others. What if I miss a dose? It is important not to miss your dose. Call your doctor or health care professional if you are unable to keep an appointment. What may interact with this medicine? Do not take this medicine with any of the following medications: -anakinra -rilonacept This medicine may also interact with the following medications: -vaccines This list may not describe all possible interactions. Give your health care provider  a list of all the medicines, herbs, non-prescription drugs, or dietary supplements you use. Also tell them if you smoke, drink alcohol, or use illegal drugs. Some items may interact with your medicine. What should I watch for while using this medicine? Visit your doctor or health care professional for regular checks on your progress. If you get a cold or other infection while receiving this medicine, call your doctor or health care professional. Do not treat yourself. This medicine may decrease your body's ability to fight infections. Before beginning therapy, your doctor may do a test to see if you have been exposed to tuberculosis. This medicine may make the symptoms of heart failure worse in some patients. If you notice symptoms such as increased shortness of breath or swelling of the ankles or legs, contact your health care provider right away. If you are going to have surgery or dental work, tell your health care professional or dentist that you have received this medicine. If you take this medicine for plaque psoriasis, stay out of the sun. If you cannot avoid being in the sun, wear protective clothing and use sunscreen. Do not use sun lamps or tanning beds/booths. What side effects may I notice from receiving this medicine? Side effects that you should report to your doctor or health care professional as soon as possible: -allergic reactions like skin rash, itching or hives, swelling of the face, lips, or tongue -chest pain -fever or chills, usually related to the infusion -muscle or joint pain -red, scaly patches or raised bumps on the skin -signs of infection - fever or chills, cough, sore throat, pain or difficulty passing urine -swollen lymph nodes in the neck, underarm,  or groin areas -unexplained weight loss -unusual bleeding or bruising -unusually weak or tired -yellowing of the eyes or skin Side effects that usually do not require medical attention (report to your doctor or health  care professional if they continue or are bothersome): -headache -heartburn or stomach pain -nausea, vomiting This list may not describe all possible side effects. Call your doctor for medical advice about side effects. You may report side effects to FDA at 1-800-FDA-1088. Where should I keep my medicine? This drug is given in a hospital or clinic and will not be stored at home. NOTE: This sheet is a summary. It may not cover all possible information. If you have questions about this medicine, talk to your doctor, pharmacist, or health care provider.    2016, Elsevier/Gold Standard. (2008-07-03 10:26:02)

## 2016-07-14 ENCOUNTER — Ambulatory Visit (INDEPENDENT_AMBULATORY_CARE_PROVIDER_SITE_OTHER)
Admission: RE | Admit: 2016-07-14 | Discharge: 2016-07-14 | Disposition: A | Discharge status: Home / Self Care | Payer: BLUE CROSS/BLUE SHIELD | Source: Ambulatory Visit | Attending: Family Medicine | Admitting: Family Medicine

## 2016-07-14 ENCOUNTER — Ambulatory Visit (INDEPENDENT_AMBULATORY_CARE_PROVIDER_SITE_OTHER): Payer: BLUE CROSS/BLUE SHIELD | Admitting: Family Medicine

## 2016-07-14 VITALS — BP 122/90 | Temp 98.3°F | Ht 64.0 in | Wt 200.4 lb

## 2016-07-14 DIAGNOSIS — S93402A Sprain of unspecified ligament of left ankle, initial encounter: Secondary | ICD-10-CM

## 2016-07-14 DIAGNOSIS — M7989 Other specified soft tissue disorders: Secondary | ICD-10-CM | POA: Diagnosis not present

## 2016-07-14 DIAGNOSIS — R600 Localized edema: Secondary | ICD-10-CM

## 2016-07-14 DIAGNOSIS — M25572 Pain in left ankle and joints of left foot: Secondary | ICD-10-CM | POA: Diagnosis not present

## 2016-07-14 NOTE — Patient Instructions (Signed)
Peripheral Edema You have swelling in your legs (peripheral edema). This swelling is due to excess accumulation of salt and water in your body. Edema may be a sign of heart, kidney or liver disease, or a side effect of a medication. It may also be due to problems in the leg veins. Elevating your legs and using special support stockings may be very helpful, if the cause of the swelling is due to poor venous circulation. Avoid long periods of standing, whatever the cause. Treatment of edema depends on identifying the cause. Chips, pretzels, pickles and other salty foods should be avoided. Restricting salt in your diet is almost always needed. Water pills (diuretics) are often used to remove the excess salt and water from your body via urine. These medicines prevent the kidney from reabsorbing sodium. This increases urine flow. Diuretic treatment may also result in lowering of potassium levels in your body. Potassium supplements may be needed if you have to use diuretics daily. Daily weights can help you keep track of your progress in clearing your edema. You should call your caregiver for follow up care as recommended. SEEK IMMEDIATE MEDICAL CARE IF:   You have increased swelling, pain, redness, or heat in your legs.  You develop shortness of breath, especially when lying down.  You develop chest or abdominal pain, weakness, or fainting.  You have a fever.   This information is not intended to replace advice given to you by your health care provider. Make sure you discuss any questions you have with your health care provider.   Document Released: 12/23/2004 Document Revised: 02/07/2012 Document Reviewed: 05/28/2015 Elsevier Interactive Patient Education 2016 [place] legs frequently Try leaving off Motrin next few days to see if edema improves

## 2016-07-15 ENCOUNTER — Encounter: Payer: Self-pay | Admitting: Family Medicine

## 2016-07-15 ENCOUNTER — Ambulatory Visit: Payer: Self-pay | Admitting: Family Medicine

## 2016-07-15 NOTE — Progress Notes (Signed)
Subjective:     Patient ID: Melissa Cohen, female   DOB: 1966/10/15, 50 y.o.   MRN: [id number]  HPI Pt seen as a work in for the following issues:  Mild bilateral leg/foot/ankle edema Duration about 3 weeks. Generally worse late in day Had recent left ankle sprain and greater edema left ankle but had some edema even prior to injury. Has ulcerative colitis and recently started on Remicade.  Other newer medication is Zoloft.  Not on any new medications otherwise- other than taking some Motrin 800 mg bid for ankle [place]. Recent CBC and CMP unremarkable.  Albumin 3.8.   Normal renal function.  TSH normal last Fall. No dyspnea on exertion or orthopnea.   Left ankle [place] about 2 weeks ago- inversion injury.  Still has some mild distal fibula pain.  No instability.  Icing some and using ACE wrap.  Past Medical History:  Diagnosis Date  . Gastric ulcer   . GERD (gastroesophageal reflux disease)   . Skin cancer, basal cell   . Ulcerative colitis    Dx at age 28   Past Surgical History:  Procedure Laterality Date  . APPENDECTOMY    . BREAST ENHANCEMENT SURGERY    . CHOLECYSTECTOMY  2011  . COLONOSCOPY    . right eye lasix     02/17/12  . TONSILLECTOMY AND ADENOIDECTOMY    . TUBAL LIGATION    . Uterine ablation      reports that she quit smoking about 10 years ago. Her smoking use included Cigarettes. She has a 12.50 pack-year smoking history. She has never used smokeless tobacco. She reports that she drinks about 1.8 oz of alcohol per week . She reports that she does not use drugs. family history includes Barrett's esophagus in her father; Cancer in her father; Colon cancer (age of onset: 39) in her father; Crohn's disease in her brother; Diabetes in her sister; Heart attack in her maternal grandfather; Heart disease in her maternal grandfather; Hyperlipidemia in her father; Prostate cancer in her father; Ulcerative colitis in her maternal grandmother and mother. Allergies  Allergen  Reactions  . Cimetidine Anaphylaxis    REACTION: anaphalactic shock  . Escitalopram Oxalate Swelling    Caused ankle swelling   . Mobic [Meloxicam] Swelling     Review of Systems  Constitutional: Negative for fatigue.  Eyes: Negative for visual disturbance.  Respiratory: Negative for cough, chest tightness, shortness of breath and wheezing.   Cardiovascular: Positive for leg swelling. Negative for chest pain and palpitations.  Neurological: Negative for dizziness, seizures, syncope, weakness, light-headedness and headaches.       Objective:   Physical Exam  Constitutional: She appears well-developed and well-nourished. No distress.  Neck: Neck supple. No thyromegaly present.  Cardiovascular: Normal rate and regular rhythm.   Pulmonary/Chest: Effort normal and breath sounds normal. No respiratory distress. She has no wheezes. She has no rales.  Musculoskeletal: She exhibits edema.  Trace lower leg edema and ankle/foot edema (left ankle > right) No calf tenderness.  Good distal pulses.   Full ROM. Minimal distal left fibular tenderness.  No ecchymosis and no erythema.        Assessment:     #1 Mild bilateral leg/ankle edema-probably multifactorial (recent Motrin, vasodilation from heat)  Recent normal renal function,albumin, etc.  No worrisome symptoms such as dyspnea.  #2 left ankle sprain.  Mild distal fibula tenderness.    Plan:     -elevate legs frequently - watch sodium intake. -try to leave  off Motrin this week -X-ray left ankle. - would try to avoid diuretics for now. - follow up with primary if sxs persist or worsen.  Eulas Post MD [facility] at [facility]

## 2016-07-20 DIAGNOSIS — K5190 Ulcerative colitis, unspecified, without complications: Secondary | ICD-10-CM | POA: Diagnosis not present

## 2016-07-20 DIAGNOSIS — M1990 Unspecified osteoarthritis, unspecified site: Secondary | ICD-10-CM | POA: Diagnosis not present

## 2016-07-20 DIAGNOSIS — R030 Elevated blood-pressure reading, without diagnosis of hypertension: Secondary | ICD-10-CM | POA: Diagnosis not present

## 2016-07-23 DIAGNOSIS — M2550 Pain in unspecified joint: Secondary | ICD-10-CM | POA: Diagnosis not present

## 2016-07-23 DIAGNOSIS — K5190 Ulcerative colitis, unspecified, without complications: Secondary | ICD-10-CM | POA: Diagnosis not present

## 2016-07-23 DIAGNOSIS — M549 Dorsalgia, unspecified: Secondary | ICD-10-CM | POA: Diagnosis not present

## 2016-07-23 DIAGNOSIS — M25569 Pain in unspecified knee: Secondary | ICD-10-CM | POA: Diagnosis not present

## 2016-07-23 DIAGNOSIS — M533 Sacrococcygeal disorders, not elsewhere classified: Secondary | ICD-10-CM | POA: Diagnosis not present

## 2016-07-23 DIAGNOSIS — M170 Bilateral primary osteoarthritis of knee: Secondary | ICD-10-CM | POA: Diagnosis not present

## 2016-07-23 DIAGNOSIS — M79641 Pain in right hand: Secondary | ICD-10-CM | POA: Diagnosis not present

## 2016-07-23 DIAGNOSIS — R5383 Other fatigue: Secondary | ICD-10-CM | POA: Diagnosis not present

## 2016-07-23 DIAGNOSIS — M79642 Pain in left hand: Secondary | ICD-10-CM | POA: Diagnosis not present

## 2016-07-29 ENCOUNTER — Ambulatory Visit: Payer: Self-pay | Admitting: Family Medicine

## 2016-08-04 MED FILL — SERTRALINE HCL 50 MG TABLET: 50 | 30 days supply | Qty: 30 | Fill #2

## 2016-08-04 MED FILL — raNITIdine HCL 300 MG TABS: 300 | 30 days supply | Qty: 30 | Fill #4

## 2016-08-04 MED FILL — ESOMEPRAZOLE MAG DR 40 MG C: 40 | 30 days supply | Qty: 30 | Fill #4

## 2016-08-05 ENCOUNTER — Telehealth: Payer: Self-pay | Admitting: Gastroenterology

## 2016-08-05 NOTE — Telephone Encounter (Signed)
Dr. Ashby Dawes is her new primary care at [facility].  Are you ok with her getting Remicade there?  She is scheduled for 1st infusion there on 09/06/16

## 2016-08-05 NOTE — Telephone Encounter (Signed)
OK. The office we need to notify us about each infusion she receives, any problems, if she misses an infusion appt., etc.

## 2016-08-06 DIAGNOSIS — K5190 Ulcerative colitis, unspecified, without complications: Secondary | ICD-10-CM | POA: Diagnosis not present

## 2016-08-06 DIAGNOSIS — M2550 Pain in unspecified joint: Secondary | ICD-10-CM | POA: Diagnosis not present

## 2016-08-06 DIAGNOSIS — M25569 Pain in unspecified knee: Secondary | ICD-10-CM | POA: Diagnosis not present

## 2016-08-06 DIAGNOSIS — M7060 Trochanteric bursitis, unspecified hip: Secondary | ICD-10-CM | POA: Diagnosis not present

## 2016-08-06 DIAGNOSIS — R5383 Other fatigue: Secondary | ICD-10-CM | POA: Diagnosis not present

## 2016-08-09 NOTE — Telephone Encounter (Signed)
I spoke with Melissa Cohen at [facility].  They have everything in place.  I will cancel infusions at [facility].  They will notify if she misses any infusions or has any issues.

## 2016-08-12 ENCOUNTER — Telehealth: Payer: Self-pay | Admitting: Gastroenterology

## 2016-08-12 NOTE — Telephone Encounter (Signed)
[facility] notified that I will fax records

## 2016-08-12 NOTE — Telephone Encounter (Signed)
[place] calling back regarding this.

## 2016-08-12 NOTE — Telephone Encounter (Signed)
Per Dr. Fuller Plan ok for infusions at [facility].  Please call and report if patient has any problems with infusions or misses an appointment.    Remicade 5 mg/kg q 8 weeks IV

## 2016-08-13 ENCOUNTER — Encounter: Payer: BLUE CROSS/BLUE SHIELD | Admitting: Family Medicine

## 2016-08-23 DIAGNOSIS — Z0000 Encounter for general adult medical examination without abnormal findings: Secondary | ICD-10-CM | POA: Diagnosis not present

## 2016-08-23 DIAGNOSIS — K5190 Ulcerative colitis, unspecified, without complications: Secondary | ICD-10-CM | POA: Diagnosis not present

## 2016-08-23 DIAGNOSIS — M2550 Pain in unspecified joint: Secondary | ICD-10-CM | POA: Diagnosis not present

## 2016-08-23 DIAGNOSIS — M1990 Unspecified osteoarthritis, unspecified site: Secondary | ICD-10-CM | POA: Diagnosis not present

## 2016-08-23 DIAGNOSIS — M25569 Pain in unspecified knee: Secondary | ICD-10-CM | POA: Diagnosis not present

## 2016-08-31 MED FILL — ESOMEPRAZOLE MAG DR 40 MG C: 40 | 30 days supply | Qty: 30 | Fill #5

## 2016-08-31 MED FILL — raNITIdine HCL 300 MG TABS: 300 | 30 days supply | Qty: 30 | Fill #5

## 2016-09-01 DIAGNOSIS — Z780 Asymptomatic menopausal state: Secondary | ICD-10-CM | POA: Diagnosis not present

## 2016-09-01 DIAGNOSIS — K5190 Ulcerative colitis, unspecified, without complications: Secondary | ICD-10-CM | POA: Diagnosis not present

## 2016-09-01 DIAGNOSIS — Z0000 Encounter for general adult medical examination without abnormal findings: Secondary | ICD-10-CM | POA: Diagnosis not present

## 2016-09-01 DIAGNOSIS — Z23 Encounter for immunization: Secondary | ICD-10-CM | POA: Diagnosis not present

## 2016-09-01 DIAGNOSIS — R030 Elevated blood-pressure reading, without diagnosis of hypertension: Secondary | ICD-10-CM | POA: Diagnosis not present

## 2016-09-02 MED FILL — PHENTERMINE 37.5 MG TABLET: 37.5 | 30 days supply | Qty: 30 | Fill #0

## 2016-09-03 MED FILL — DIPHENOXYLATE-ATROP 2.5-0.0: 2.5-0.025 | 30 days supply | Qty: 60 | Fill #1

## 2016-09-06 DIAGNOSIS — K5190 Ulcerative colitis, unspecified, without complications: Secondary | ICD-10-CM | POA: Diagnosis not present

## 2016-09-07 ENCOUNTER — Encounter: Payer: Self-pay | Admitting: Neurology

## 2016-09-07 ENCOUNTER — Ambulatory Visit: Payer: BLUE CROSS/BLUE SHIELD | Admitting: Neurology

## 2016-09-07 ENCOUNTER — Encounter (HOSPITAL_COMMUNITY): Payer: BLUE CROSS/BLUE SHIELD

## 2016-09-07 VITALS — BP 100/72 | HR 80 | Resp 20 | Ht 64.0 in | Wt 196.0 lb

## 2016-09-07 DIAGNOSIS — G2581 Restless legs syndrome: Secondary | ICD-10-CM

## 2016-09-07 DIAGNOSIS — R0683 Snoring: Secondary | ICD-10-CM | POA: Diagnosis not present

## 2016-09-07 DIAGNOSIS — G4710 Hypersomnia, unspecified: Secondary | ICD-10-CM | POA: Diagnosis not present

## 2016-09-07 DIAGNOSIS — G4730 Sleep apnea, unspecified: Secondary | ICD-10-CM

## 2016-09-07 DIAGNOSIS — D500 Iron deficiency anemia secondary to blood loss (chronic): Secondary | ICD-10-CM | POA: Diagnosis not present

## 2016-09-07 NOTE — Progress Notes (Signed)
SLEEP MEDICINE CLINIC   Provider:  Larey Seat, M D  Referring Provider:  Primary Care Physician:  Merrilee Seashore, MD  Chief Complaint  Patient presents with  . New Patient (Initial Visit)    snores, never had a sleep study    HPI:  Melissa Cohen is a 50 y.o. female , seen here as a referral from Dr. Ashby Dawes. Chief complaint according to patient : RLS keeps me from sleeping, I snore and stop breathing. Former Medical illustrator.   Dr. Alfonso Patten. referred her for a sleep evaluation. [name] is a menopausal Caucasian female of 50 years of age, right-handed and reported right away that she is a heavy snorer and that snoring even wakes her from sleep. She also reports restless legs, affecting her left lower extremity to a higher degree than the right, with a deep bone pain and she experiences the irresistible urge to move after which she will find some relief temporarily. The patient has a history of hypertension, ulcerative colitis, is overweight him a she reports word finding difficulties and just over the last year or so. She endorsed the Epworth sleepiness score at Dr. Mathis Fare office at 11 points, quality of sleep at 9 points. He already recommended to her to try tonic water at night to see if this will help her restless legs. She also has been taking magnesium tablets. She reports no effect at this time she has only tried it for about 4 days now. Over the last couple of years she has gained about 40 pounds she reports.  Sleep habits are as follows: She usually goes to bed now at about 11 PM, she rises at 6:30. Her bed partner take some pushes her a lot being bothered by her snoring. She usually sleeps on either side, avoid supine sleep as this causes a snoring to be loudest. She uses 2 pillows to support her head, the bedroom is cool and quiet and dark. 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 She will have one bathroom break at night. Some nights she will sleep through other nights she may have multiple arousals, she has used a fit bit to track her sleep. She seems not to have insomnia and the usual sense but it sometimes takes a long time to initiate sleep, once asleep she can sleep through a less she wakes herself from snoring or is woken up by her partner. She uses an alarm in the morning but hits the snooze button repeatedly. She does not feel ready to go refreshed or restored. She has frequently a dry mouth and sometimes headaches in the morning.  Sleep medical history and family sleep history:  She started REMICADE in June , for colitis, and feels the restless legs have been worse.   Social history:  The patient is a former smoker, quit after 25-pack-years 10 years ago. She does drink socially alcohol, not daily. Overall week less than 7 drinks, one cup of coffee in the morning most mornings, and 3 or 4 afternoons a week one glass of iced tea. 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   Review of Systems: Out of a complete 14 system review, the patient complains of only the following symptoms, and all other reviewed systems are negative.   Epworth score  10 , Fatigue severity score  56  , depression score 3/15  snoring , colitis    Social History   Social History  . Marital status: Divorced    Spouse name: N/A  . Number of children: 3  . Years of education: N/A   Occupational History  . Nursing secretary  [place] History Main Topics  . Smoking status: Former Smoker    Packs/day: 0.50    Years: 25.00    Types: Cigarettes    Quit date: 02/27/2006  . Smokeless tobacco: Never Used  . Alcohol use 1.8 oz/week    3 Glasses of wine per week     Comment: every other weekend socially  . Drug use: No  . Sexual activity: Yes   Other Topics Concern  . Not on file   Social History Narrative  .  No narrative on file    Family History  Problem Relation Age of Onset  . Prostate cancer Father   . Hyperlipidemia Father   . Barrett's esophagus Father   . Cancer Father     prostate  . Colon cancer Father 89    appendical adenocarcinoma 2011  . Diabetes Sister   . Diabetes      Grandparents  . Heart attack Maternal Grandfather   . Heart disease Maternal Grandfather   . Ulcerative colitis Mother   . Ulcerative colitis Maternal Grandmother   . Crohn's disease Brother   . Esophageal cancer Neg Hx   . Rectal cancer Neg Hx   . Stomach cancer Neg Hx   . Allergic rhinitis Neg Hx   . Angioedema Neg Hx   . Asthma Neg Hx   . Atopy Neg Hx   . Eczema Neg Hx   . Immunodeficiency Neg Hx   . Colon polyps Neg Hx     Past Medical History:  Diagnosis Date  . Gastric ulcer   . GERD (gastroesophageal reflux disease)   . Skin cancer, basal cell   . Ulcerative colitis    Dx at age 7    Past Surgical History:  Procedure Laterality Date  . APPENDECTOMY    . BREAST ENHANCEMENT SURGERY    . CHOLECYSTECTOMY  2011  . COLONOSCOPY    . right eye lasix     02/17/12  . TONSILLECTOMY AND ADENOIDECTOMY    . TUBAL LIGATION    . Uterine ablation      Current Outpatient Prescriptions  Medication Sig Dispense Refill  . acetaminophen (TYLENOL) 500 MG tablet Take 1,000 mg by mouth every 6 (six) hours as needed for headache.    . albuterol (PROVENTIL HFA;VENTOLIN HFA) 108 (90 Base) MCG/ACT inhaler Inhale 2 puffs into the lungs every 6 (six) hours as needed for wheezing or shortness of breath. 1 Inhaler 2  . ALPRAZolam (XANAX) 0.25 MG tablet Take 1 tablet (0.25 mg total) by mouth 2 (two) times daily as needed for anxiety. 30 tablet 0  . CALCIUM PO Take 1,500 mg by mouth daily.    . Cholecalciferol (VITAMIN D PO) Take 10,000 Units by mouth daily.    . diphenoxylate-atropine (LOMOTIL) 2.5-0.025 MG tablet Take 1 tablet by mouth 2 (two) times daily. (Patient taking differently: Take 2 tablets by  mouth 2 (two) times daily. ) 60 tablet 5  . esomeprazole (NEXIUM) 40 MG capsule TAKE 1 CAPSULE BY MOUTH ONCE DAILY 30 capsule 6  . glycopyrrolate (ROBINUL) 2 MG tablet Take 1 tablet (2 mg total) by mouth 2 (two) times daily. 60 tablet 5  .  inFLIXimab (REMICADE) 100 MG injection Inject into the vein.    Marland Kitchen OVER THE COUNTER MEDICATION Take 1 tablet by mouth daily. Juice plus    . Probiotic Product (VSL#3) CAPS Take 1 capsule by mouth 2 (two) times daily. 60 capsule 5  . ranitidine (ZANTAC) 300 MG tablet Take 1 tablet (300 mg total) by mouth at bedtime. 30 tablet 5   No current facility-administered medications for this visit.     Allergies as of 09/07/2016 - Review Complete 09/07/2016  Allergen Reaction Noted  . Cimetidine Anaphylaxis 02/09/2007  . Escitalopram oxalate Swelling 02/23/2012  . Mobic [meloxicam] Swelling 12/18/2015  . Tramadol Swelling 09/07/2016    Vitals: BP 100/72   Pulse 80   Resp 20   Ht 5' 4"  (1.626 m)   Wt 196 lb (88.9 kg)   LMP 09/02/2013   BMI 33.64 kg/m  Last Weight:  Wt Readings from Last 1 Encounters:  09/07/16 196 lb (88.9 kg)   OVF:IEPP mass index is 33.64 kg/m.     Last Height:   Ht Readings from Last 1 Encounters:  09/07/16 5' 4"  (1.626 m)    Physical exam:  General: The patient is awake, alert and appears not in acute distress. The patient is well groomed. Head: Normocephalic, atraumatic. Neck is supple. Mallampati 3,  neck circumference: 16 inches . Nasal airflow patent , TMJ is not  evident . Retrognathia is mild . Biological teeth  Cardiovascular:  Regular rate and rhythm, without  murmurs or carotid bruit, and without distended neck veins. Respiratory: Lungs are clear to auscultation. Skin:  Without evidence of edema, or rash Trunk: BMI is 34  The patient's posture is erect   Neurologic exam : The patient is awake and alert, oriented to place and time.   Attention span & concentration ability appears normal.  Speech is fluent,  without  dysarthria, dysphonia or aphasia.  Mood and affect are appropriate.  Cranial nerves: Pupils are equal and briskly reactive to light. Funduscopic exam without  evidence of pallor or edema.  Extraocular movements  in vertical and horizontal planes intact and without nystagmus. Visual fields by finger perimetry are intact. Hearing to finger rub intact. Facial sensation intact to fine touch.Facial motor strength is symmetric and tongue and uvula move midline. Shoulder shrug was symmetrical.   Motor exam:   Normal tone, muscle bulk and symmetric strength in all extremities. Sensory:  Fine touch, pinprick and vibration were tested in all extremities. Proprioception tested in the upper extremities was normal. Coordination: Rapid alternating movements in the fingers/hands was normal. Finger-to-nose maneuver  normal without evidence of ataxia, dysmetria or tremor.  Gait and station: Patient walks without assistive device and is able unassisted to climb up to the exam table. Strength within normal limits.   Deep tendon reflexes: in the  upper and lower extremities are symmetric and intact.  The patient was advised of the nature of the diagnosed sleep disorder , the treatment options and risks for general a health and wellness arising from not treating the condition.  I spent more than 35  minutes of face to face time with the patient. Greater than 50% of time was spent in counseling and coordination of care. We have discussed the diagnosis and differential and I answered the patient's questions.    [name] report called that the last time she felt rested and restored in the morning was doing a vacation on a cruise ship sharing a cabin with her mother, who didn't care  if she snored. There was no need for an alarm clock and the rocking and soothing motions of the boat also helped her to sleep.   Assessment:  After physical and neurologic examination, review of laboratory studies,  Personal review of  imaging studies, reports of other /same  Imaging studies ,  Results of polysomnography/ neurophysiology testing and pre-existing records as far as provided in visit., my assessment is   1) I strongly suspect that Melissa Cohen has obstructive sleep apnea, and that she is a louder snorer due to airway restrictions I cannot appreciate in the initial evaluation of the upper airway.   She may also suffer from a higher degree of fatigue not just because of an underlying sleep disorder, but related to her autoimmune disorder. All autoimmune disorders are strongly associated with high degrees of fatigue, Crohn's disease, MS, rheumatoid arthritis, colitis and lupus.  2) it is an interesting aspect but she reports that Remicade has helped her colitis but seems to have exacerbated the restless leg component. There is a deep gnawing pain in her left leg especially and she finds relief by moving but only temporarily. This often prolongs her latency to sleep. I will add a total iron binding capacity and ferritin level to today's labs,  3) I will order an attended sleep study, with special attention to Co2 and PLMs. .     Plan:  Treatment plan and additional workup :  I would like to have the opportunity to see the activity of the restless legs before and medicate. I also want to make sure that this is not an iron deficiency related restless leg syndrome. The patient is menopausal, With the last menstrual period about 3 years ago. It is possible that she is losing iron due to micro-bleeds related to colitis. SPLIT and iron TIBC.      Asencion Partridge Haeven Nickle MD  09/07/2016   CC: Ashby Dawes , MD

## 2016-09-08 ENCOUNTER — Encounter: Payer: Self-pay | Admitting: Gastroenterology

## 2016-09-08 LAB — IRON AND TIBC
IRON SATURATION: 10 % — AB (ref 15–55)
IRON: 44 ug/dL (ref 27–159)
TIBC: 452 ug/dL — AB (ref 250–450)
UIBC: 408 ug/dL (ref 131–425)

## 2016-09-08 LAB — FERRITIN: FERRITIN: 10 ng/mL — AB (ref 15–150)

## 2016-09-09 ENCOUNTER — Telehealth: Payer: Self-pay

## 2016-09-09 NOTE — Telephone Encounter (Signed)
Received this notice from Dr. Lynne Leader office:  " Algernon Huxley, RN  Lester Churchill, RN        Per Dr. Fuller Plan pt need to contact her PCP regarding either oral or IV iron replacement.     I called pt and advised her that Dr. Fuller Plan did receive the results and wants her to speak with the PCP regarding oral/IV iron infusions. Pt verbalized understanding.

## 2016-09-09 NOTE — Telephone Encounter (Signed)
I spoke to pt. She says that her PCP's office says that her insurance may not approve the iron infusion if she has not tried oral iron supplementation. She says that she would rather have IV iron because she has ulcerative colitis and the oral iron will cause more side effects. She wants to know if her insurance will cover the infusion. I told her that I did not know but would ask our infusion suite staff.  I spoke to the infusion suite staff and they say that there is really no way to find out if the infusion will be covered or not unless they run the claim. There is not a set rule.  I called and advised pt of this. She says that she will call Dr. Mathis Fare office and discuss it with the "insurance lady" over there.

## 2016-09-09 NOTE — Telephone Encounter (Signed)
Pt returned call

## 2016-09-09 NOTE — Telephone Encounter (Signed)
I spoke with pt. She says that she has already seen the results on mychart. She says that she already gets infusions at her PCP of Remicade so would like to have her iron infusions done at Dr. Mathis Fare office. However, she first wants to talk to Dr. Ashby Dawes and Dr. Fuller Plan regarding these results before she decides on an iron infusion. (Dr. Fuller Plan is her GI doctor.) Pt asked that I fax a copy of her labs to Dr. Ashby Dawes and Dr. Fuller Plan. Pt verbalized understanding of results. Pt had no questions at this time but was encouraged to call back if questions arise.

## 2016-09-09 NOTE — Telephone Encounter (Signed)
Pt called in and wants to go over options . Please call [phone]

## 2016-09-09 NOTE — Telephone Encounter (Signed)
-----   Message from Larey Seat, MD sent at 09/08/2016  5:39 PM EDT ----- Dear Mrs. Wiseman, You have severe iron deficiency with low ferritin, high binding and lowest saturation. This can be cause for RLS ! Lets get you in for iron infusion and after wards you continue with oral iron to maintain the improved levels. Ferritin under 50 ng is a risk factor.  Carmen Dohmeier.

## 2016-09-09 NOTE — Telephone Encounter (Signed)
I called pt to discuss her labs. No answer, left a message asking her to call me back.

## 2016-09-14 DIAGNOSIS — D500 Iron deficiency anemia secondary to blood loss (chronic): Secondary | ICD-10-CM | POA: Diagnosis not present

## 2016-09-17 DIAGNOSIS — D500 Iron deficiency anemia secondary to blood loss (chronic): Secondary | ICD-10-CM | POA: Diagnosis not present

## 2016-09-20 ENCOUNTER — Encounter: Payer: Self-pay | Admitting: Gastroenterology

## 2016-09-20 ENCOUNTER — Ambulatory Visit (INDEPENDENT_AMBULATORY_CARE_PROVIDER_SITE_OTHER): Payer: BLUE CROSS/BLUE SHIELD | Admitting: Gastroenterology

## 2016-09-20 VITALS — BP 114/80 | HR 84 | Ht 63.78 in | Wt 199.2 lb

## 2016-09-20 DIAGNOSIS — K219 Gastro-esophageal reflux disease without esophagitis: Secondary | ICD-10-CM | POA: Diagnosis not present

## 2016-09-20 DIAGNOSIS — D500 Iron deficiency anemia secondary to blood loss (chronic): Secondary | ICD-10-CM | POA: Diagnosis not present

## 2016-09-20 DIAGNOSIS — K5100 Ulcerative (chronic) pancolitis without complications: Secondary | ICD-10-CM | POA: Diagnosis not present

## 2016-09-20 MED ORDER — NA SULFATE-K SULFATE-MG SULF 17.5-3.13-1.6 GM/177ML PO SOLN: 1.0000 | Freq: Once | ORAL | 0 refills | Status: AC

## 2016-09-20 NOTE — Progress Notes (Addendum)
    History of Present Illness: This is a 50 year old female with ulcerative colitis on Remicade. She has been asymptomatic from a GI standpoint. Normal stools without bleeding or diarrhea. Occasionally she notes slightly loose stools which she relates to certain food intolerances. She has mild generalized abdominal discomfort that has not changed. These symptoms have responded to glycopyrrolate however she feels the symptoms are so mild she has not been taking glycopyrrolate. Her reflux symptoms are under very good control on her current regimen and she would like to decrease her medications. She is concerned about multiple joint symptoms including both hands, both hips and both knees. She has been evaluated by a rheumatologist and relates a diagnosis of osteoarthritis. She had knee injection performed recently.  Newly diagnosed iron deficiency and RLS receiving IV iron replacement.  She complains of ongoing fatigue and poor exercise tolerance.   Current Medications, Allergies, Past Medical History, Past Surgical History, Family History and Social History were reviewed in [employer] record.  Physical Exam: General: Well developed, well nourished, no acute distress Head: Normocephalic and atraumatic Eyes:  sclerae anicteric, EOMI Ears: Normal auditory acuity Mouth: No deformity or lesions Lungs: Clear throughout to auscultation Heart: Regular rate and rhythm; no murmurs, rubs or bruits Abdomen: Soft, non tender and non distended. No masses, hepatosplenomegaly or hernias noted. Normal Bowel sounds Musculoskeletal: Symmetrical with no gross deformities  Pulses:  Normal pulses noted Extremities: No clubbing, cyanosis, edema or deformities noted Neurological: Alert oriented x 4, grossly nonfocal Psychological:  Alert and cooperative. Normal mood and affect  Assessment and Recommendations:  1. Ulcerative colitis. She is doing very well on Remicade. Her next dose of  Remicade is due on 12/5. She is now receiving her Remicade at [facility]. It appears that her rheumatologist prescribed her last dose of Remicade. Will attempt to clarify mgmt with [facility]. Schedule colonoscopy at about 6 months from the start of Remicade therapy to assess healing/inflammation. The risks (including bleeding, perforation, infection, missed lesions, medication reactions and possible hospitalization or surgery if complications occur), benefits, and alternatives to colonoscopy with possible biopsy and possible polypectomy were discussed with the patient and they consent to proceed.   2. GERD. Continue standard antireflux measures. Continue Nexium 40 mg daily. Attempt to discontinue evening dose of ranitidine as per her request. If reflux symptoms return she is advised to resume evening ranitidine.  3. Mild food intolerances versus mild IBS. Glycopyrrolate 1 mg twice a day when necessary.  4. Iron deficiency anemia, fatigue, poor exercise tolerance. Evaluation and management per PCP.  5. Multiple joint symptoms. Request office notes from her rheumatologist.   I spent 25 minutes of face-to-face time with the patient. Greater than 50% of the time was spent counseling and coordinating care.

## 2016-09-20 NOTE — Patient Instructions (Signed)
You have been scheduled for a colonoscopy. Please follow written instructions given to you at your visit today.  Please pick up your prep supplies at the pharmacy within the next 1-3 days. If you use inhalers (even only as needed), please bring them with you on the day of your procedure. Your physician has requested that you go to www.startemmi.com and enter the access code given to you at your visit today. This web site gives a general overview about your procedure. However, you should still follow specific instructions given to you by our office regarding your preparation for the procedure.  Thank you for choosing me and [facility].  Malcolm T. Stark, Jr., MD., FACG  

## 2016-09-24 DIAGNOSIS — D500 Iron deficiency anemia secondary to blood loss (chronic): Secondary | ICD-10-CM | POA: Diagnosis not present

## 2016-09-30 ENCOUNTER — Other Ambulatory Visit: Payer: Self-pay

## 2016-09-30 ENCOUNTER — Other Ambulatory Visit: Payer: Self-pay | Admitting: Allergy and Immunology

## 2016-09-30 DIAGNOSIS — E669 Obesity, unspecified: Secondary | ICD-10-CM | POA: Diagnosis not present

## 2016-09-30 DIAGNOSIS — K219 Gastro-esophageal reflux disease without esophagitis: Secondary | ICD-10-CM

## 2016-09-30 DIAGNOSIS — Z6834 Body mass index (BMI) 34.0-34.9, adult: Secondary | ICD-10-CM | POA: Diagnosis not present

## 2016-09-30 MED ORDER — RANITIDINE HCL 300 MG PO TABS: 300.0000 mg | ORAL_TABLET | Freq: Every day | ORAL | 5 refills | Status: DC

## 2016-09-30 MED FILL — ESOMEPRAZOLE MAG DR 40 MG C: 40 | 30 days supply | Qty: 30 | Fill #6

## 2016-09-30 MED FILL — raNITIdine HCL 300 MG TABS: 300 | 30 days supply | Qty: 30 | Fill #0

## 2016-09-30 MED FILL — DIPHENOXYLATE/ATROPINE TAB: 2.5-0.025 | 30 days supply | Qty: 60 | Fill #2

## 2016-09-30 MED FILL — PHENTERMINE 37.5 MG TABLET: 37.5 | 30 days supply | Qty: 30 | Fill #1

## 2016-10-04 MED FILL — BELVIQ XR 20 MG TABLET: 20 | 30 days supply | Qty: 30 | Fill #0

## 2016-10-06 ENCOUNTER — Other Ambulatory Visit: Payer: Self-pay | Admitting: Dermatology

## 2016-10-06 DIAGNOSIS — D239 Other benign neoplasm of skin, unspecified: Secondary | ICD-10-CM | POA: Diagnosis not present

## 2016-10-06 DIAGNOSIS — D485 Neoplasm of uncertain behavior of skin: Secondary | ICD-10-CM | POA: Diagnosis not present

## 2016-10-06 DIAGNOSIS — Z85820 Personal history of malignant melanoma of skin: Secondary | ICD-10-CM | POA: Diagnosis not present

## 2016-10-12 ENCOUNTER — Ambulatory Visit (INDEPENDENT_AMBULATORY_CARE_PROVIDER_SITE_OTHER): Payer: BLUE CROSS/BLUE SHIELD | Admitting: Neurology

## 2016-10-12 DIAGNOSIS — R0683 Snoring: Secondary | ICD-10-CM

## 2016-10-12 DIAGNOSIS — G4730 Sleep apnea, unspecified: Secondary | ICD-10-CM

## 2016-10-12 DIAGNOSIS — G4710 Hypersomnia, unspecified: Secondary | ICD-10-CM | POA: Diagnosis not present

## 2016-10-12 DIAGNOSIS — G2581 Restless legs syndrome: Secondary | ICD-10-CM

## 2016-10-20 NOTE — Procedures (Signed)
PATIENT'S NAME:  Melissa Cohen, Melissa Cohen DOB:      Oct 15, 1966      MR#:    [id number]     DATE OF RECORDING: 10/12/2016 REFERRING M.D.:  Annye Asa MD Study Performed:   Baseline Polysomnogram HISTORY:  Gastric ulcer, GERD, Skin cancer, Ulcerative colitis, excessive daytime sleepiness, snoring  obesity.  The patient endorsed the Epworth Sleepiness Scale at 10/24 points.  The patient's weight 196 pounds with a height of 64 (inches), resulting in a BMI of 33.5 kg/m2.The patient's neck circumference measured 16 inches.  CURRENT MEDICATIONS: Tylenol, Proventil, Xanax, Calcium , Vitamin D, Lomotil, Nexium, Robinul, Remicade, Probiotics, Zantac   PROCEDURE:  This is a multichannel digital polysomnogram utilizing the Somnostar 11.2 system.  Electrodes and sensors were applied and monitored per AASM Specifications.   EEG, EOG, Chin and Limb EMG, were sampled at 200 Hz.  ECG, Snore and Nasal Pressure, Thermal Airflow, Respiratory Effort, CPAP Flow and Pressure, Oximetry was sampled at 50 Hz. Digital video and audio were recorded.      BASELINE STUDY : Lights Out was at 22:48 and Lights On at 04:59.  Total recording time (TRT) was 372 minutes, with a total sleep time (TST) of 323 minutes.   The patient's sleep latency was 5 minutes.  REM latency was 83 minutes.  The sleep efficiency was 86.8 %.     SLEEP ARCHITECTURE: WASO (Wake after sleep onset) was 44 minutes.  There were 9.5 minutes in Stage N1, 252 minutes Stage N2, 5 minutes Stage N3 and 56.5 minutes in Stage REM.  The percentage of Stage N1 was 2.9%, Stage N2 was 78.%, Stage N3 was 1.5% and Stage R (REM sleep) was 17.5%.   RESPIRATORY ANALYSIS:  There were a total of 33 respiratory events:  3 obstructive apneas, 1 central apnea and 0 mixed apneas with 29 hypopneas with 3 respiratory event related arousals (RERAs).   The total APNEA/HYPOPNEA INDEX (AHI) was 6.1/hour and the total RESPIRATORY DISTURBANCE INDEX was 6.7 /hour.   21 events occurred in REM  sleep and 22 events in NREM. The REM AHI was 22.3 /hour, versus a non-REM AHI of 2.7. The patient spent 285 minutes of total sleep time in the supine position and 38 minutes in non-supine. The supine AHI was 4.0 versus a non-supine AHI of 22.1.  OXYGEN SATURATION & C02:  The Wake baseline 02 saturation was 93%, with the lowest being 83%. Time spent below 89% saturation equaled 8 minutes.  PERIODIC LIMB MOVEMENTS:  The patient had a total of 19 Periodic Limb Movements. The Periodic Limb Movement (PLM) index was 3.5 and the PLM Arousal index was 0/hour.  IMPRESSION:   1.  Mild Obstructive Sleep Apnea(OSA), with a strong REM accentuation.  2.  Insignificant hypoxemia.  3. Snoring.  4. Delayed REM onset, medication related?  .   RECOMMENDATIONS:   Snoring is too mild to need treatment,  Apnea is clinically insignificant and  REM prevalent. No evidence of sleep in insomnia, but sleep fragmentation.  This degree of sleep disordered breathing does not require intervention.     1. A follow up appointment will be scheduled in the Sleep Clinic at [facility]. The referring provider will be notified of the results.      I certify that I have reviewed the entire raw data recording prior to the issuance of this report in accordance with the Standards of Accreditation of the [place] [employer] (AASM)    Larey Seat, MD  10-20-2016 Diplomat, American Board of Psychiatry and Neurology  Diplomat, Tax adviser of Sleep Medicine Market researcher, [employer] [facility] at [employer]

## 2016-10-25 ENCOUNTER — Telehealth: Payer: Self-pay

## 2016-10-25 NOTE — Telephone Encounter (Signed)
-----   Message from Larey Seat, MD sent at 10/20/2016  6:02 PM EST ----- The patient snores, but has no clinically significant apnea, weight loss and sleep on the side may be enough to treat her snoring. She can come back to discuss the results if she wants to. CD

## 2016-10-25 NOTE — Telephone Encounter (Signed)
This encounter was created in error - please disregard.

## 2016-10-25 NOTE — Telephone Encounter (Signed)
I called pt to discuss sleep study results. No answer, left a message asking her to call me back.

## 2016-10-26 NOTE — Telephone Encounter (Signed)
I called pt again to discuss sleep study results. No answer, left a message asking her to call me back.

## 2016-10-27 NOTE — Telephone Encounter (Signed)
I spoke to pt. I advised her that Dr. Brett Fairy noted that she snores but did not have significant apnea. Weight loss and sleeping on her side my treat her snoring. Pt declined a follow up visit to discuss results. She is currently in [place] on vacation. Pt verbalized understanding of results. Pt had no questions at this time but was encouraged to call back if questions arise.

## 2016-10-27 NOTE — Telephone Encounter (Signed)
I called pt again to discuss sleep study results. No answer, left a message asking her to call me back. This is my third attempt at reaching pt by phone. If she has not called me back by tomorrow, I will send her a letter.

## 2016-11-01 MED FILL — PROMETHAZINE-PE-CODEINE SYR: 6.25-5-10 | 6 days supply | Qty: 90 | Fill #0

## 2016-11-02 ENCOUNTER — Encounter (HOSPITAL_COMMUNITY): Payer: BLUE CROSS/BLUE SHIELD

## 2016-11-02 MED FILL — raNITIdine HCL 300 MG TABS: 300 | 30 days supply | Qty: 30 | Fill #1

## 2016-11-02 MED FILL — DIPHENOXYLATE/ATROPINE TAB: 2.5-0.025 | 30 days supply | Qty: 60 | Fill #3

## 2016-11-02 MED FILL — GLYCOPYRROLATE 2 MG TABLET: 2 | 30 days supply | Qty: 60 | Fill #1

## 2016-11-03 ENCOUNTER — Telehealth: Payer: Self-pay

## 2016-11-03 ENCOUNTER — Other Ambulatory Visit: Payer: Self-pay

## 2016-11-03 DIAGNOSIS — R413 Other amnesia: Secondary | ICD-10-CM | POA: Diagnosis not present

## 2016-11-03 DIAGNOSIS — E669 Obesity, unspecified: Secondary | ICD-10-CM | POA: Diagnosis not present

## 2016-11-03 DIAGNOSIS — D500 Iron deficiency anemia secondary to blood loss (chronic): Secondary | ICD-10-CM | POA: Diagnosis not present

## 2016-11-03 DIAGNOSIS — R030 Elevated blood-pressure reading, without diagnosis of hypertension: Secondary | ICD-10-CM | POA: Diagnosis not present

## 2016-11-03 DIAGNOSIS — E559 Vitamin D deficiency, unspecified: Secondary | ICD-10-CM | POA: Diagnosis not present

## 2016-11-03 DIAGNOSIS — R05 Cough: Secondary | ICD-10-CM | POA: Diagnosis not present

## 2016-11-03 DIAGNOSIS — Z6832 Body mass index (BMI) 32.0-32.9, adult: Secondary | ICD-10-CM | POA: Diagnosis not present

## 2016-11-03 MED ORDER — ESOMEPRAZOLE MAGNESIUM 40 MG PO CPDR: 40.0000 mg | DELAYED_RELEASE_CAPSULE | Freq: Every day | ORAL | 5 refills | Status: DC

## 2016-11-03 MED FILL — BELVIQ XR 20 MG TABLET: 20 | 30 days supply | Qty: 30 | Fill #0

## 2016-11-03 MED FILL — ESOMEPRAZOLE MAG DR 40 MG C: 40 | 30 days supply | Qty: 30 | Fill #0

## 2016-11-03 NOTE — Telephone Encounter (Signed)
Left message on voicemail that I would leave a Suprep sample up front to be picked up.

## 2016-11-05 ENCOUNTER — Encounter: Payer: Self-pay | Admitting: Gastroenterology

## 2016-11-09 ENCOUNTER — Ambulatory Visit (AMBULATORY_SURGERY_CENTER): Payer: BLUE CROSS/BLUE SHIELD | Admitting: Gastroenterology

## 2016-11-09 ENCOUNTER — Encounter: Payer: Self-pay | Admitting: Gastroenterology

## 2016-11-09 VITALS — BP 104/73 | HR 73 | Temp 97.5°F | Resp 19 | Ht 63.0 in | Wt 199.0 lb

## 2016-11-09 DIAGNOSIS — D123 Benign neoplasm of transverse colon: Secondary | ICD-10-CM

## 2016-11-09 DIAGNOSIS — D124 Benign neoplasm of descending colon: Secondary | ICD-10-CM | POA: Diagnosis not present

## 2016-11-09 DIAGNOSIS — K5100 Ulcerative (chronic) pancolitis without complications: Secondary | ICD-10-CM | POA: Diagnosis not present

## 2016-11-09 DIAGNOSIS — K529 Noninfective gastroenteritis and colitis, unspecified: Secondary | ICD-10-CM | POA: Diagnosis not present

## 2016-11-09 DIAGNOSIS — K5140 Inflammatory polyps of colon without complications: Secondary | ICD-10-CM | POA: Diagnosis not present

## 2016-11-09 DIAGNOSIS — K5190 Ulcerative colitis, unspecified, without complications: Secondary | ICD-10-CM | POA: Diagnosis not present

## 2016-11-09 MED ORDER — SODIUM CHLORIDE 0.9 % IV SOLN: 500.0000 mL | INTRAVENOUS | Status: DC

## 2016-11-09 NOTE — Op Note (Signed)
[facility] Patient Name: Anjana Cheek Procedure Date: 11/09/2016 1:45 PM MRN: [id number] Endoscopist: Ladene Artist , MD Age: 50 Referring MD:  Date of Birth: 01-01-1966 Gender: Female Account #: [id number] Procedure:                Colonoscopy Indications:              High risk colon cancer surveillance: Ulcerative                            pancolitis of 8 (or more) years duration Medicines:                Monitored Anesthesia Care Procedure:                Pre-Anesthesia Assessment:                           - Prior to the procedure, a History and Physical                            was performed, and patient medications and                            allergies were reviewed. The patient's tolerance of                            previous anesthesia was also reviewed. The risks                            and benefits of the procedure and the sedation                            options and risks were discussed with the patient.                            All questions were answered, and informed consent                            was obtained. Prior Anticoagulants: The patient has                            taken no previous anticoagulant or antiplatelet                            agents. ASA Grade Assessment: III - A patient with                            severe systemic disease. After reviewing the risks                            and benefits, the patient was deemed in                            satisfactory condition to undergo the procedure.  After obtaining informed consent, the colonoscope                            was passed under direct vision. Throughout the                            procedure, the patient's blood pressure, pulse, and                            oxygen saturations were monitored continuously. The                            Model PCF-H190L [id number]) scope was introduced                            through the  anus and advanced to the the cecum,                            identified by appendiceal orifice and ileocecal                            valve. The ileocecal valve, appendiceal orifice,                            and rectum were photographed. The quality of the                            bowel preparation was adequate. The colonoscopy was                            performed without difficulty. The patient tolerated                            the procedure well. Scope In: 1:56:24 PM Scope Out: 2:15:30 PM Scope Withdrawal Time: 0 hours 16 minutes 57 seconds  Total Procedure Duration: 0 hours 19 minutes 6 seconds  Findings:                 The perianal and digital rectal examinations were                            normal.                           A 20 mm polyp was found in the distal transverse                            colon at about 65 cm. The polyp was multi-lobulated                            and sessile with a broad irregular base. Biopsies                            were taken with a cold forceps for histology. Area  proximal to the polyp was tattooed with an                            injection of 2 mL of Spot (carbon black).                           A 18 mm polyp was found in the proximal descending                            colon at about 55 cm. The polyp was multi-lobulated                            with a broad irregular base. A portion of the polyp                            was removed with a hot snare. Resection was                            incomplete - the polyp tissue was retrieved.                           Diffuse mild inflammation characterized by loss of                            vascularity and scarring was found in the                            transverse colon, at the hepatic flexure, in the                            ascending colon and in the cecum. Biopsies were                            taken with a cold forceps for  histology.                           A 20 mm polyp was found in the mid descending colon                            at about 45 cm. The polyp was sessile with a broad                            irregular base. Biopsies were taken with a cold                            forceps for histology.                           Diffuse mild inflammation characterized by                            congestion (edema), erythema and pseudopolyps was  found in the rectum, in the sigmoid colon, in the                            descending colon and at the splenic flexure.                           The exam was otherwise without abnormality on                            direct and retroflexion views. Complications:            No immediate complications. Estimated blood loss:                            None. Estimated Blood Loss:     Estimated blood loss: none. Impression:               - One 20 mm polyp in the distal transverse colon.                            Biopsied. Tattooed.                           - One 18 mm polyp in the proximal descending colon,                            removed with a hot snare. Complete resection.                            Partial retrieval.                           - Diffuse mild inflammation with scarring was found                            in the transverse colon, at the hepatic flexure, in                            the ascending colon and in the cecum secondary to                            quiescent ulcerative colitis. Biopsied.                           - One 20 mm polyp in the mid descending colon.                            Biopsied.                           - Diffuse mild inflammation was found in the                            rectum, in the sigmoid colon, in the descending  colon and at the splenic flexure secondary to                            ulcerative colitis.                           - The examination  was otherwise normal on direct                            and retroflexion views. Recommendation:           - Repeat colonoscopy date to be determined after                            pending pathology results are reviewed for                            surveillance.                           - Patient has a contact number available for                            emergencies. The signs and symptoms of potential                            delayed complications were discussed with the                            patient. Return to normal activities tomorrow.                            Written discharge instructions were provided to the                            patient.                           - Resume previous diet.                           - Continue present medications.                           - Await pathology results. Ladene Artist, MD 11/09/2016 2:32:11 PM This report has been signed electronically.

## 2016-11-09 NOTE — Progress Notes (Signed)
Called to room to assist during endoscopic procedure.  Patient ID and intended procedure confirmed with present staff. Received instructions for my participation in the procedure from the performing physician.  

## 2016-11-09 NOTE — Patient Instructions (Signed)
YOU HAD AN ENDOSCOPIC PROCEDURE TODAY AT [facility]:   Refer to the procedure report that was given to you for any specific questions about what was found during the examination.  If the procedure report does not answer your questions, please call your gastroenterologist to clarify.  If you requested that your care partner not be given the details of your procedure findings, then the procedure report has been included in a sealed envelope for you to review at your convenience later.  YOU SHOULD EXPECT: Some feelings of bloating in the abdomen. Passage of more gas than usual.  Walking can help get rid of the air that was put into your GI tract during the procedure and reduce the bloating. If you had a lower endoscopy (such as a colonoscopy or flexible sigmoidoscopy) you may notice spotting of blood in your stool or on the toilet paper. If you underwent a bowel prep for your procedure, you may not have a normal bowel movement for a few days.  Please Note:  You might notice some irritation and congestion in your nose or some drainage.  This is from the oxygen used during your procedure.  There is no need for concern and it should clear up in a day or so.  SYMPTOMS TO REPORT IMMEDIATELY:   Following lower endoscopy (colonoscopy or flexible sigmoidoscopy):  Excessive amounts of blood in the stool  Significant tenderness or worsening of abdominal pains  Swelling of the abdomen that is new, acute  Fever of 100F or higher   For urgent or emergent issues, a gastroenterologist can be reached at any hour by calling [phone].   DIET:  We do recommend a small meal at first, but then you may proceed to your regular diet.  Drink plenty of fluids but you should avoid alcoholic beverages for 24 hours.  ACTIVITY:  You should plan to take it easy for the rest of today and you should NOT DRIVE or use heavy machinery until tomorrow (because of the sedation medicines used during the test).     FOLLOW UP: Our staff will call the number listed on your records the next business day following your procedure to check on you and address any questions or concerns that you may have regarding the information given to you following your procedure. If we do not reach you, we will leave a message.  However, if you are feeling well and you are not experiencing any problems, there is no need to return our call.  We will assume that you have returned to your regular daily activities without incident.  If any biopsies were taken you will be contacted by phone or by letter within the next 1-3 weeks.  Please call us at [phone] if you have not heard about the biopsies in 3 weeks.    SIGNATURES/CONFIDENTIALITY: You and/or your care partner have signed paperwork which will be entered into your electronic medical record.  These signatures attest to the fact that that the information above on your After Visit Summary has been reviewed and is understood.  Full responsibility of the confidentiality of this discharge information lies with you and/or your care-partner.  Colonoscopy follow up depends on pathology results. No aspirin , no ibuprofen no non-steriodal antiinflammatory  Medications for two weeks after polyps removal Polyps ( handout given)

## 2016-11-10 ENCOUNTER — Telehealth: Payer: Self-pay

## 2016-11-10 ENCOUNTER — Telehealth: Payer: Self-pay | Admitting: *Deleted

## 2016-11-10 NOTE — Telephone Encounter (Signed)
  Follow up Call-  Call back number 11/09/2016 05/13/2016  Post procedure Call Back phone  # [phone]  Permission to leave phone message Yes Yes  Some recent data might be hidden     Patient questions:  Do you have a fever, pain , or abdominal swelling? No. Pain Score  0 *  Have you tolerated food without any problems? Yes.    Have you been able to return to your normal activities? Yes.    Do you have any questions about your discharge instructions: Diet   No. Medications  No. Follow up visit  No.  Do you have questions or concerns about your Care? No.  Actions: * If pain score is 4 or above: No action needed, pain <4.

## 2016-11-10 NOTE — Telephone Encounter (Signed)
Message left

## 2016-11-11 DIAGNOSIS — D500 Iron deficiency anemia secondary to blood loss (chronic): Secondary | ICD-10-CM | POA: Diagnosis not present

## 2016-11-11 DIAGNOSIS — K5190 Ulcerative colitis, unspecified, without complications: Secondary | ICD-10-CM | POA: Diagnosis not present

## 2016-11-18 ENCOUNTER — Encounter: Payer: Self-pay | Admitting: Gastroenterology

## 2016-11-18 ENCOUNTER — Other Ambulatory Visit: Payer: Self-pay | Admitting: Dermatology

## 2016-11-18 DIAGNOSIS — D492 Neoplasm of unspecified behavior of bone, soft tissue, and skin: Secondary | ICD-10-CM | POA: Diagnosis not present

## 2016-11-19 ENCOUNTER — Encounter: Payer: Self-pay | Admitting: Gastroenterology

## 2016-12-06 MED FILL — ESOMEPRAZOLE MAG DR 40 MG C: 40 | 30 days supply | Qty: 30 | Fill #1

## 2016-12-06 MED FILL — BELVIQ XR 20 MG TABLET: 20 | 30 days supply | Qty: 30 | Fill #1

## 2016-12-30 DIAGNOSIS — M1990 Unspecified osteoarthritis, unspecified site: Secondary | ICD-10-CM | POA: Diagnosis not present

## 2016-12-30 DIAGNOSIS — M2550 Pain in unspecified joint: Secondary | ICD-10-CM | POA: Diagnosis not present

## 2016-12-30 DIAGNOSIS — M25569 Pain in unspecified knee: Secondary | ICD-10-CM | POA: Diagnosis not present

## 2016-12-30 DIAGNOSIS — K5190 Ulcerative colitis, unspecified, without complications: Secondary | ICD-10-CM | POA: Diagnosis not present

## 2017-01-03 MED FILL — BELVIQ XR 20 MG TABLET: 20 | 30 days supply | Qty: 30 | Fill #0

## 2017-01-05 DIAGNOSIS — Z1231 Encounter for screening mammogram for malignant neoplasm of breast: Secondary | ICD-10-CM | POA: Diagnosis not present

## 2017-01-05 DIAGNOSIS — E669 Obesity, unspecified: Secondary | ICD-10-CM | POA: Diagnosis not present

## 2017-01-05 DIAGNOSIS — Z6831 Body mass index (BMI) 31.0-31.9, adult: Secondary | ICD-10-CM | POA: Diagnosis not present

## 2017-01-06 DIAGNOSIS — K5190 Ulcerative colitis, unspecified, without complications: Secondary | ICD-10-CM | POA: Diagnosis not present

## 2017-01-24 MED FILL — raNITIdine HCL 300 MG TABS: 300 | 30 days supply | Qty: 30 | Fill #2

## 2017-01-24 MED FILL — ESOMEPRAZOLE MAG DR 40 MG C: 40 | 30 days supply | Qty: 30 | Fill #2

## 2017-01-31 MED FILL — BELVIQ XR 20 MG TABLET: 20 | 30 days supply | Qty: 30 | Fill #0

## 2017-02-09 DIAGNOSIS — S83242A Other tear of medial meniscus, current injury, left knee, initial encounter: Secondary | ICD-10-CM | POA: Diagnosis not present

## 2017-02-09 MED FILL — CELECOXIB 200 MG CAP: 200 | 30 days supply | Qty: 60 | Fill #0

## 2017-02-14 DIAGNOSIS — M25562 Pain in left knee: Secondary | ICD-10-CM | POA: Diagnosis not present

## 2017-02-16 DIAGNOSIS — S83412A Sprain of medial collateral ligament of left knee, initial encounter: Secondary | ICD-10-CM | POA: Diagnosis not present

## 2017-02-23 MED FILL — raNITIdine HCL 300 MG TABS: 300 | 30 days supply | Qty: 30 | Fill #3

## 2017-02-23 MED FILL — ESOMEPRAZOLE MAG DR 40 MG C: 40 | 30 days supply | Qty: 30 | Fill #3

## 2017-03-03 DIAGNOSIS — K5190 Ulcerative colitis, unspecified, without complications: Secondary | ICD-10-CM | POA: Diagnosis not present

## 2017-03-03 DIAGNOSIS — D500 Iron deficiency anemia secondary to blood loss (chronic): Secondary | ICD-10-CM | POA: Diagnosis not present

## 2017-03-03 DIAGNOSIS — Z79899 Other long term (current) drug therapy: Secondary | ICD-10-CM | POA: Diagnosis not present

## 2017-03-10 MED FILL — BELVIQ XR 20 MG TABLET: 20 | 30 days supply | Qty: 30 | Fill #1

## 2017-03-10 MED FILL — CELECOXIB 200 MG CAP: 200 | 30 days supply | Qty: 60 | Fill #1

## 2017-03-15 DIAGNOSIS — R5383 Other fatigue: Secondary | ICD-10-CM | POA: Diagnosis not present

## 2017-03-15 DIAGNOSIS — R0602 Shortness of breath: Secondary | ICD-10-CM | POA: Diagnosis not present

## 2017-03-15 DIAGNOSIS — E6609 Other obesity due to excess calories: Secondary | ICD-10-CM | POA: Diagnosis not present

## 2017-03-16 ENCOUNTER — Telehealth: Payer: Self-pay | Admitting: Cardiology

## 2017-03-16 NOTE — Telephone Encounter (Signed)
Received records from [facility] for appointment on 03/24/17 with Dr Ellyn Hack.  Records put with Dr Allison Quarry schedule for 03/24/17. lp

## 2017-03-18 ENCOUNTER — Ambulatory Visit: Payer: BLUE CROSS/BLUE SHIELD | Admitting: Cardiology

## 2017-03-24 ENCOUNTER — Encounter: Payer: Self-pay | Admitting: Cardiology

## 2017-03-24 ENCOUNTER — Ambulatory Visit (INDEPENDENT_AMBULATORY_CARE_PROVIDER_SITE_OTHER): Payer: BLUE CROSS/BLUE SHIELD | Admitting: Cardiology

## 2017-03-24 VITALS — BP 120/84 | HR 84 | Ht 64.0 in | Wt 192.4 lb

## 2017-03-24 DIAGNOSIS — R6889 Other general symptoms and signs: Secondary | ICD-10-CM | POA: Diagnosis not present

## 2017-03-24 DIAGNOSIS — R011 Cardiac murmur, unspecified: Secondary | ICD-10-CM

## 2017-03-24 MED FILL — raNITIdine HCL 300 MG TABS: 300 | 30 days supply | Qty: 30 | Fill #4

## 2017-03-24 MED FILL — ESOMEPRAZOLE MAG DR 40 MG C: 40 | 30 days supply | Qty: 30 | Fill #4

## 2017-03-24 NOTE — Progress Notes (Signed)
PCP: Melissa Seashore, MD  Clinic Note: Chief Complaint  Patient presents with  . New Patient (Initial Visit)    no chest pain  . Shortness of Breath    when walking up hill.  . Leg Pain    cramping in legs    HPI: Melissa Cohen is a 51 y.o. female who is being seen today for the evaluation of DOE & exercise Intolerance at the request of Melissa Seashore, MD.  Melissa Cohen was Seen by Dr. Esperanza Cohen done on April 17 complaining of various vague symptoms of fatigue and shortness of breath. Symptoms have been relatively gradual onset and seemed of all started back when she had some iron this patient's anemia in the past several months. She does not feel well rested with extreme fatigue. Even though she gets a full night sleep she does not feel rested.  Recent Hospitalizations: None  Studies Reviewed: None  Interval History: Melissa Cohen presents here today for evaluation mostly for what seems to be worsening exertional dyspnea. She says since January of last year she has been noticing progressively worsening shortness of breath. These all came to a head when she had her last bout of ulcerative colitis this past summer and had some significant iron deficiency anemia. Now she says that she felt a lot better after she got her iron infusion. That taking care of her shortness of breath fatigue and restless leg syndrome to get worse. Unfortunately things seem to have backtracked now over the last 3-4 weeks she has been started have the same symptoms again. Specifically going up hills or up steps less than 1 flight she will get profoundly dyspneic. She feels her heart rate little bit and has a tight difficulty breathing in her chest. Only rarely has she noticed some more surgery swelling. No PND or orthopnea. She does note having gained about 40 pounds in the last couple years. She has not had any chest tightness or pressure with rest or exertion. What she does notice me a. She's had a chronic  cough over the last 3 months that does not seem to be getting any better. This is all in addition to her significant fatigue. Her thyroid level was relatively normal BNP level was very low at 11.1.  She denies any syncope or near-syncope or TIAs amaurosis fugax symptoms. She feels her heart rate going up, but denies any rapid irregular heartbeats or palpitations.  No claudication.  ROS: A comprehensive was performed. Review of Systems  Constitutional: Positive for malaise/fatigue.  HENT: Negative for congestion and nosebleeds.   Respiratory: Positive for shortness of breath.   Cardiovascular: Negative for chest pain.  Gastrointestinal: Negative for blood in stool and melena.       Her GI symptoms have begun to calm down after starting Remicade  Genitourinary: Negative for hematuria.  Neurological: Positive for dizziness (Positional).       Restless leg, worse on left than right  Psychiatric/Behavioral: Negative.   All other systems reviewed and are negative.  I have reviewed and (if needed) updated the patient's problem list, medications, allergies, past medical and surgical history, social and family history.  Past Medical History:  Diagnosis Date  . Gastric ulcer   . GERD (gastroesophageal reflux disease)   . Iron deficiency anemia   . Restless leg syndrome   . Skin cancer, basal cell   . Ulcerative colitis    Dx at age 59; had been 81 years since her last 64 and then had a  flareup recently in summer of 2017. Was then started back on Remicade and is doing well since.    Past Surgical History:  Procedure Laterality Date  . APPENDECTOMY    . BREAST ENHANCEMENT SURGERY  2004  . CHOLECYSTECTOMY  2011  . COLONOSCOPY    . right eye lasix     02/17/12  . TONSILLECTOMY AND ADENOIDECTOMY    . TUBAL LIGATION    . Uterine ablation      Current Meds  Medication Sig  . acetaminophen (TYLENOL) 500 MG tablet Take 1,000 mg by mouth every 6 (six) hours as needed for headache.    . albuterol (PROVENTIL HFA;VENTOLIN HFA) 108 (90 Base) MCG/ACT inhaler Inhale 2 puffs into the lungs every 6 (six) hours as needed for wheezing or shortness of breath.  . ALPRAZolam (XANAX) 0.25 MG tablet Take 1 tablet (0.25 mg total) by mouth 2 (two) times daily as needed for anxiety.  . BELVIQ XR 20 MG TB24   . CALCIUM PO Take 1,500 mg by mouth daily.  . celecoxib (CELEBREX) 200 MG capsule   . Cholecalciferol (VITAMIN D PO) Take 10,000 Units by mouth daily.  Marland Kitchen esomeprazole (NEXIUM) 40 MG capsule Take 1 capsule (40 mg total) by mouth daily.  . ferric carboxymaltose (INJECTAFER) 750 MG/15ML SOLN Inject 750 mg into the vein as directed.  Marland Kitchen GLUCOSAMINE-MSM-HYALURONIC ACD PO Take 1 tablet by mouth daily.  Marland Kitchen glycopyrrolate (ROBINUL) 2 MG tablet Take 1 tablet (2 mg total) by mouth 2 (two) times daily. (Patient taking differently: Take 2 mg by mouth as needed. )  . inFLIXimab (REMICADE) 100 MG injection Inject into the vein.  Marland Kitchen inFLIXimab in sodium chloride 0.9 % Inject 5 mg/kg into the vein every 8 (eight) weeks. 448 mg  . naproxen sodium (ANAPROX) 220 MG tablet Take 660 mg by mouth 2 (two) times daily with a meal.  . OVER THE COUNTER MEDICATION Take 1 tablet by mouth daily. Juice plus  . Probiotic Product (VSL#3) CAPS Take 1 capsule by mouth 2 (two) times daily.  . ranitidine (ZANTAC) 300 MG tablet Take 1 tablet (300 mg total) by mouth at bedtime.  . [DISCONTINUED] diphenoxylate-atropine (LOMOTIL) 2.5-0.025 MG tablet Take 1 tablet by mouth 2 (two) times daily. (Patient taking differently: Take 2 tablets by mouth 2 (two) times daily. )   Current Facility-Administered Medications for the 03/24/17 encounter (Office Visit) with Leonie Man, MD  Medication  . 0.9 %  sodium chloride infusion    Allergies  Allergen Reactions  . Cimetidine Anaphylaxis    REACTION: anaphalactic shock  . Escitalopram Oxalate Swelling    Caused ankle swelling   . Mobic [Meloxicam] Swelling  . Tramadol Swelling     Social History   Social History  . Marital status: Divorced    Spouse name: N/A  . Number of children: 3  . Years of education: N/A   Occupational History  . Nursing secretary  [place] History Main Topics  . Smoking status: Former Smoker    Packs/day: 0.50    Years: 25.00    Types: Cigarettes    Quit date: 02/27/2006  . Smokeless tobacco: Never Used  . Alcohol use 1.8 oz/week    3 Glasses of wine per week     Comment: every other weekend socially  . Drug use: No  . Sexual activity: Yes   Other Topics Concern  . None   Social History Narrative   1-2 cups of tea a day.  Currently lives with her partner having. She divorced. She likes to walk daily doing exercise. She now does pet care.    family history includes Barrett's esophagus in her father; Cancer in her father; Colon cancer (age of onset: 55) in her father; Crohn's disease in her brother; Diabetes in her sister; Heart attack in her maternal grandfather; Heart disease in her maternal grandfather; Hyperlipidemia in her father; Prostate cancer in her father; Ulcerative colitis in her maternal grandmother and mother.  Wt Readings from Last 3 Encounters:  03/24/17 192 lb 6.4 oz (87.3 kg)  11/09/16 199 lb (90.3 kg)  09/20/16 199 lb 4 oz (90.4 kg)    PHYSICAL EXAM BP 120/84   Pulse 84   Ht 5' 4"  (1.626 m)   Wt 192 lb 6.4 oz (87.3 kg)   LMP 09/02/2013   BMI 33.03 kg/m  General appearance: alert, cooperative, appears stated age, no distress and Mildly obese HEENT: [place]/AT, EOMI, MMM, anicteric sclera Neck: no adenopathy, no carotid bruit and no JVD Lungs: clear to auscultation bilaterally, normal percussion bilaterally and non-labored Heart: regular rate and rhythm, S1 & S2 normal, no  click, rub or gallop; soft 1/6 SEM at RUSB. Does not radiate Abdomen: soft, non-tender; bowel sounds normal; no masses,  no organomegaly; no HJR Extremities: extremities normal, atraumatic, no cyanosis, or edema   Pulses: 2+ and symmetric;  Skin: mobility and turgor normal, no evidence of bleeding or bruising, no lesions noted, temperature normal and texture normal or  Neurologic: Mental status: Alert, oriented, thought content appropriate; pleasant mood and affect Cranial nerves: normal (II-XII grossly intact)     Adult ECG Report  Other studies Reviewed: Additional studies/ records that were reviewed today include:  Recent Labs:  n/a - some labs reviewed above.   ASSESSMENT / PLAN: Problem List Items Addressed This Visit    Exercise intolerance - Primary    She's gained a lot of weight partly because of her treatment for ulcerative colitis of late. She is also not very active. As result she has put on quite a bit of weight and this may very well be the main etiology for her dyspnea being obesity and deconditioning. However he does have mild family history of CAD. Determined that the dyspnea is related to cardiac versus pulmonary versus combination, I think a Cardiopulmonary Stress Test (CPX) would be a good way of determining physiologically was going on. Partly because the murmur, but also probably because of the change in blood pressures will be curious to see if she she has diastolic dysfunction related dyspnea as well.  Would also recommend to CoQ10.      Relevant Orders   ECHOCARDIOGRAM COMPLETE   Cardiopulmonary exercise test   Systolic murmur    Sounds like a relatively benign aortic murmur. We'll confirm with 2-D echo. This can also give [place] important information for dyspnea as well.      Relevant Orders   ECHOCARDIOGRAM COMPLETE      Current medicines are reviewed at length with the patient today. (+/- concerns) n/a The following changes have been made: n/a  Patient Instructions  Schedule 1126 at [facility] has requested that you have an echocardiogram. Echocardiography is a painless test that uses sound waves to create images of your heart.  It provides your doctor with information about the size and shape of your heart and how well your heart's chambers and valves are working. This procedure takes approximately one hour. There are no  restrictions for this procedure.    SCHEDULE AT [facility] Your physician has recommended that you have a cardiopulmonary stress test (CPX). CPX testing is a non-invasive measurement of heart and lung function. It replaces a traditional treadmill stress test. This type of test provides a tremendous amount of information that relates not only to your present condition but also for future outcomes. This test combines measurements of you ventilation, respiratory gas exchange in the lungs, electrocardiogram (EKG), blood pressure and physical response before, during, and following an exercise protocol.    NO OTHER CHANGES  Your physician recommends that you schedule a follow-up appointment in [place].     Studies Ordered:   Orders Placed This Encounter  Procedures  . Cardiopulmonary exercise test  . ECHOCARDIOGRAM COMPLETE      Glenetta Hew, M.D., M.S. Interventional Cardiologist   Pager # [phone] Phone # [phone] [address] [place], [place] [address]

## 2017-03-24 NOTE — Patient Instructions (Signed)
Schedule 1126 at [facility] has requested that you have an echocardiogram. Echocardiography is a painless test that uses sound waves to create images of your heart. It provides your doctor with information about the size and shape of your heart and how well your heart's chambers and valves are working. This procedure takes approximately one hour. There are no restrictions for this procedure.    SCHEDULE AT [facility] Your physician has recommended that you have a cardiopulmonary stress test (CPX). CPX testing is a non-invasive measurement of heart and lung function. It replaces a traditional treadmill stress test. This type of test provides a tremendous amount of information that relates not only to your present condition but also for future outcomes. This test combines measurements of you ventilation, respiratory gas exchange in the lungs, electrocardiogram (EKG), blood pressure and physical response before, during, and following an exercise protocol.    NO OTHER CHANGES  Your physician recommends that you schedule a follow-up appointment in [place].

## 2017-03-25 ENCOUNTER — Telehealth: Payer: Self-pay

## 2017-03-25 MED ORDER — DIPHENOXYLATE-ATROPINE 2.5-0.025 MG PO TABS: 1.0000 | ORAL_TABLET | Freq: Two times a day (BID) | ORAL | 2 refills | Status: DC

## 2017-03-25 MED FILL — DIPHENOXYLATE/ATROPINE TAB: 2.5-0.025 | 30 days supply | Qty: 60 | Fill #0

## 2017-03-25 NOTE — Telephone Encounter (Signed)
Received automated fax from pharmacy of patient requesting refill of Lomotil? Dr. Fuller Plan, can we fill?

## 2017-03-25 NOTE — Telephone Encounter (Signed)
OK, low number of refills

## 2017-03-25 NOTE — Telephone Encounter (Signed)
Prescription sent to patient's pharmacy.

## 2017-03-26 ENCOUNTER — Encounter: Payer: Self-pay | Admitting: Cardiology

## 2017-03-26 DIAGNOSIS — R011 Cardiac murmur, unspecified: Secondary | ICD-10-CM | POA: Insufficient documentation

## 2017-03-26 DIAGNOSIS — R6889 Other general symptoms and signs: Secondary | ICD-10-CM | POA: Insufficient documentation

## 2017-03-26 NOTE — Assessment & Plan Note (Addendum)
She's gained a lot of weight partly because of her treatment for ulcerative colitis of late. She is also not very active. As result she has put on quite a bit of weight and this may very well be the main etiology for her dyspnea being obesity and deconditioning. However he does have mild family history of CAD. Determined that the dyspnea is related to cardiac versus pulmonary versus combination, I think a Cardiopulmonary Stress Test (CPX) would be a good way of determining physiologically was going on. Partly because the murmur, but also probably because of the change in blood pressures will be curious to see if she she has diastolic dysfunction related dyspnea as well.  Would also recommend to CoQ10.

## 2017-03-26 NOTE — Assessment & Plan Note (Signed)
Sounds like a relatively benign aortic murmur. We'll confirm with 2-D echo. This can also give [place] important information for dyspnea as well.

## 2017-03-31 MED FILL — QSYMIA 7.5 MG-46 MG CAPSULE: 7.5-46 | 30 days supply | Qty: 30 | Fill #0

## 2017-03-31 MED FILL — QSYMIA 3.75 MG-23 MG CAP: 3.75-23 | 14 days supply | Qty: 14 | Fill #0

## 2017-04-12 ENCOUNTER — Ambulatory Visit (HOSPITAL_COMMUNITY): Payer: BLUE CROSS/BLUE SHIELD | Attending: Cardiology

## 2017-04-12 ENCOUNTER — Other Ambulatory Visit: Payer: Self-pay

## 2017-04-12 DIAGNOSIS — R6889 Other general symptoms and signs: Secondary | ICD-10-CM | POA: Diagnosis not present

## 2017-04-12 DIAGNOSIS — Z720 Tobacco use: Secondary | ICD-10-CM | POA: Diagnosis not present

## 2017-04-12 DIAGNOSIS — R011 Cardiac murmur, unspecified: Secondary | ICD-10-CM | POA: Diagnosis not present

## 2017-04-13 ENCOUNTER — Encounter: Payer: Self-pay | Admitting: Cardiology

## 2017-04-14 ENCOUNTER — Encounter (HOSPITAL_COMMUNITY): Payer: BLUE CROSS/BLUE SHIELD

## 2017-04-14 ENCOUNTER — Other Ambulatory Visit (HOSPITAL_COMMUNITY): Payer: BLUE CROSS/BLUE SHIELD

## 2017-04-18 MED FILL — CELECOXIB 200 MG CAP: 200 | 30 days supply | Qty: 60 | Fill #0

## 2017-04-20 MED FILL — ESOMEPRAZOLE MAG DR 40 MG C: 40 | 30 days supply | Qty: 30 | Fill #5

## 2017-04-20 MED FILL — raNITIdine HCL 300 MG TABS: 300 | 30 days supply | Qty: 30 | Fill #5

## 2017-04-26 DIAGNOSIS — K5190 Ulcerative colitis, unspecified, without complications: Secondary | ICD-10-CM | POA: Diagnosis not present

## 2017-05-03 ENCOUNTER — Telehealth: Payer: Self-pay | Admitting: Cardiology

## 2017-05-03 ENCOUNTER — Encounter: Payer: Self-pay | Admitting: Gastroenterology

## 2017-05-03 DIAGNOSIS — M25569 Pain in unspecified knee: Secondary | ICD-10-CM | POA: Diagnosis not present

## 2017-05-03 DIAGNOSIS — K5190 Ulcerative colitis, unspecified, without complications: Secondary | ICD-10-CM | POA: Diagnosis not present

## 2017-05-03 DIAGNOSIS — M1990 Unspecified osteoarthritis, unspecified site: Secondary | ICD-10-CM | POA: Diagnosis not present

## 2017-05-03 DIAGNOSIS — M2550 Pain in unspecified joint: Secondary | ICD-10-CM | POA: Diagnosis not present

## 2017-05-03 NOTE — Telephone Encounter (Signed)
Spoke with pt, aware will forward to dr harding to see if he would like to wait until she has recovered or if other stress testing would be appropriate for her. All of her appointments have been canceled.

## 2017-05-03 NOTE — Telephone Encounter (Signed)
I sent a message back to the patient explaining I will let her know about increase when Dr. Fuller Plan returns to the office next week.  Dr. Fuller Plan please see her questions about increasing Remicade dose.  I also advised her that once you weigh in I will contact her about setting up an office visit, last visit was December 2017

## 2017-05-03 NOTE — Telephone Encounter (Signed)
New Message    Please call pt back, she cancelled appt for 05/06/17 she has not had all the testing done, she can not gave the exercise test done she had knee surgery , can you order a different test

## 2017-05-04 ENCOUNTER — Other Ambulatory Visit: Payer: Self-pay | Admitting: Family Medicine

## 2017-05-04 NOTE — Telephone Encounter (Signed)
  RE: Non-Urgent Medical Question  Ladene Artist, MD  Sent: Tue May 03, 2017 4:54 PM  To: Marlon Pel, RN            Message   Remicade dose increase could be necessary. I want to see my IBD patients on biologics at least every 6 months so she is due for a visit.        Patient notified of the recommendations.  She will come in to discuss on 06/08/17

## 2017-05-04 NOTE — Telephone Encounter (Signed)
SPOKE TO PATIENT . AWARE NEED TO SCHEDULE CPX ONCE KNEE HAS RECOVERED. PATIENT WILL CALL AND RESCHEDULE TEST AND FOLLOW UP APPOINTMENT

## 2017-05-04 NOTE — Telephone Encounter (Signed)
Last OV 06/03/16 Alprazolam last filled 06/03/16 #30 with 0   Pt is listed as having a different PCP

## 2017-05-04 NOTE — Telephone Encounter (Signed)
Let's just reschedule it for when her knee rehabilitation is complete. This must have come up since I saw her over 1 month ago.   We can follow-up on her Echo.  Glenetta Hew, MD

## 2017-05-06 ENCOUNTER — Ambulatory Visit: Payer: BLUE CROSS/BLUE SHIELD | Admitting: Cardiology

## 2017-05-30 ENCOUNTER — Other Ambulatory Visit: Payer: Self-pay | Admitting: Gastroenterology

## 2017-05-30 DIAGNOSIS — K219 Gastro-esophageal reflux disease without esophagitis: Secondary | ICD-10-CM

## 2017-05-30 MED FILL — raNITIdine HCL 300 MG TABS: 300 | 30 days supply | Qty: 30 | Fill #0

## 2017-05-30 MED FILL — ESOMEPRAZOLE MAG DR 40 MG C: 40 | 30 days supply | Qty: 30 | Fill #0

## 2017-05-30 MED FILL — CELECOXIB 200 MG CAP: 200 | 30 days supply | Qty: 60 | Fill #1

## 2017-06-02 MED FILL — QSYMIA 7.5 MG-46 MG CAPSULE: 7.5-46 | 30 days supply | Qty: 30 | Fill #0

## 2017-06-08 ENCOUNTER — Ambulatory Visit (INDEPENDENT_AMBULATORY_CARE_PROVIDER_SITE_OTHER): Payer: BLUE CROSS/BLUE SHIELD | Admitting: Gastroenterology

## 2017-06-08 ENCOUNTER — Encounter: Payer: Self-pay | Admitting: Gastroenterology

## 2017-06-08 VITALS — BP 110/70 | HR 68 | Ht 64.0 in | Wt 196.4 lb

## 2017-06-08 DIAGNOSIS — K5100 Ulcerative (chronic) pancolitis without complications: Secondary | ICD-10-CM

## 2017-06-08 DIAGNOSIS — K219 Gastro-esophageal reflux disease without esophagitis: Secondary | ICD-10-CM

## 2017-06-08 MED ORDER — RANITIDINE HCL 300 MG PO TABS: 300.0000 mg | ORAL_TABLET | Freq: Every day | ORAL | 3 refills | Status: DC

## 2017-06-08 MED ORDER — ESOMEPRAZOLE MAGNESIUM 40 MG PO CPDR: 40.0000 mg | DELAYED_RELEASE_CAPSULE | Freq: Every day | ORAL | 3 refills | Status: DC

## 2017-06-08 MED ORDER — DIPHENOXYLATE-ATROPINE 2.5-0.025 MG PO TABS: 1.0000 | ORAL_TABLET | Freq: Two times a day (BID) | ORAL | 3 refills | Status: DC

## 2017-06-08 MED FILL — ALPRAZolam 0.25 MG TABS: 0.25 | 15 days supply | Qty: 30 | Fill #0

## 2017-06-08 NOTE — Progress Notes (Signed)
    History of Present Illness: This is a 51 year old female with ulcerative colitis returning for follow-up. She has generally done quite well on Remicade infusions although she notes for the 10 days prior to her next infusion she has looser stools. This has been reproducible pattern over the past several months. Her reflux symptoms are well controlled as long as she takes both medications. No other GI complaints.  Current Medications, Allergies, Past Medical History, Past Surgical History, Family History and Social History were reviewed in [employer] record.  Physical Exam: General: Well developed, well nourished, no acute distress Head: Normocephalic and atraumatic Eyes:  sclerae anicteric, EOMI Ears: Normal auditory acuity Mouth: No deformity or lesions Lungs: Clear throughout to auscultation Heart: Regular rate and rhythm; no murmurs, rubs or bruits Abdomen: Soft, non tender and non distended. No masses, hepatosplenomegaly or hernias noted. Normal Bowel sounds Musculoskeletal: Symmetrical with no gross deformities  Pulses:  Normal pulses noted Extremities: No clubbing, cyanosis, edema or deformities noted Neurological: Alert oriented x 4, grossly nonfocal Psychological:  Alert and cooperative. Normal mood and affect  Assessment and Recommendations:  1. Universal ulcerative colitis. Symptoms very well controlled on Remicade except for the last 10 days prior to infusion. Obtain infliximab antibodies and trough level. Consider increasing frequency of infusions, increasing doseage or changing to another biologc medication. Will review blood work and discuss recommendations, options. Remain on current dosing and every 8 weeks scheduling for now. REV timing depending on blood work and recommendations.   2. GERD. Continue Nexium 40 mg every morning and ranitidine 300 mg at bedtime. Follow standard antireflux measures.

## 2017-06-08 NOTE — Patient Instructions (Addendum)
We have sent the following medications to your pharmacy for you to pick up at your convenience:Nexium, ranitidine and Lomotil.  Please come back our office on 06/23/17 and ask for [name]/Eshaan Titzer so we can give you the form to take to the lab to check Remicade level.  Thank you for choosing me and [facility].  Pricilla Riffle. Dagoberto Ligas., MD., Marval Regal

## 2017-06-23 ENCOUNTER — Other Ambulatory Visit: Payer: BLUE CROSS/BLUE SHIELD

## 2017-06-23 DIAGNOSIS — K5100 Ulcerative (chronic) pancolitis without complications: Secondary | ICD-10-CM

## 2017-06-23 DIAGNOSIS — K5190 Ulcerative colitis, unspecified, without complications: Secondary | ICD-10-CM | POA: Diagnosis not present

## 2017-06-28 DIAGNOSIS — Z6833 Body mass index (BMI) 33.0-33.9, adult: Secondary | ICD-10-CM | POA: Diagnosis not present

## 2017-06-28 DIAGNOSIS — R5383 Other fatigue: Secondary | ICD-10-CM | POA: Diagnosis not present

## 2017-06-28 DIAGNOSIS — E6609 Other obesity due to excess calories: Secondary | ICD-10-CM | POA: Diagnosis not present

## 2017-06-28 DIAGNOSIS — D500 Iron deficiency anemia secondary to blood loss (chronic): Secondary | ICD-10-CM | POA: Diagnosis not present

## 2017-06-29 ENCOUNTER — Telehealth: Payer: Self-pay

## 2017-06-29 DIAGNOSIS — K51919 Ulcerative colitis, unspecified with unspecified complications: Secondary | ICD-10-CM

## 2017-06-29 NOTE — Telephone Encounter (Signed)
Patient notified of the results and recommendations She is notified that we are waiting on the insurance to approve every 6 weeks.  Once we hear back I will let her know.  She just had an infusion on 06/23/17, so if approved would be 08/05/17 for her next infusion.

## 2017-06-29 NOTE — Telephone Encounter (Signed)
Left message for patient to call back and discuss options for lab results.  Best option is to increase her Remicade infusions to q 6 weeks, if her insurance will not cover this then would increase the dose to 10 mg/kg.

## 2017-07-01 ENCOUNTER — Other Ambulatory Visit: Payer: Self-pay | Admitting: Gastroenterology

## 2017-07-01 DIAGNOSIS — K219 Gastro-esophageal reflux disease without esophagitis: Secondary | ICD-10-CM

## 2017-07-01 MED FILL — DIPHENOXYLATE/ATROPINE TAB: 2.5-0.025 | 30 days supply | Qty: 60 | Fill #1

## 2017-07-01 MED FILL — raNITIdine HCL 300 MG TABS: 300 | 30 days supply | Qty: 30 | Fill #0

## 2017-07-01 MED FILL — ESOMEPRAZOLE MAG DR 40 MG C: 40 | 30 days supply | Qty: 30 | Fill #1

## 2017-07-04 MED FILL — CELECOXIB 200 MG CAPSULE: 200 | 30 days supply | Qty: 60 | Fill #0

## 2017-07-12 MED FILL — QSYMIA 7.5 MG-46 MG CAPSULE: 7.5-46 | 30 days supply | Qty: 30 | Fill #0

## 2017-07-13 NOTE — Telephone Encounter (Signed)
Mason Jim, Amy L sent to Marlon Pel, RN        Grace Bushy with [facility] had left a msg for me yesterday while I was out. She has been approved and said they will be calling her to schedule.  Thanks,  Amy

## 2017-07-18 MED ORDER — SODIUM CHLORIDE 0.9 % IV SOLN: 5.0000 mg/kg | INTRAVENOUS | Status: DC

## 2017-07-18 NOTE — Telephone Encounter (Signed)
Change in frequency is noted.

## 2017-07-18 NOTE — Telephone Encounter (Signed)
Yes, see Patty's note below.  I sent a staff msg that she was approved and they would be contacting her to schedule.  Thanks!

## 2017-07-18 NOTE — Telephone Encounter (Signed)
Amy,   Have we heard from her insurance?

## 2017-07-18 NOTE — Telephone Encounter (Signed)
Patient has been scheduled for 08/04/17 for her next infusion.

## 2017-07-18 NOTE — Telephone Encounter (Signed)
Approved for the every 6 week Remicade?

## 2017-07-18 NOTE — Telephone Encounter (Signed)
Yes Ma'am every 6 weeks.

## 2017-07-26 DIAGNOSIS — Z6831 Body mass index (BMI) 31.0-31.9, adult: Secondary | ICD-10-CM | POA: Diagnosis not present

## 2017-07-26 DIAGNOSIS — E669 Obesity, unspecified: Secondary | ICD-10-CM | POA: Diagnosis not present

## 2017-07-26 MED FILL — QSYMIA 11.25 MG-69 MG CAP: 11.25-69 | 30 days supply | Qty: 30 | Fill #0

## 2017-08-03 ENCOUNTER — Other Ambulatory Visit: Payer: Self-pay

## 2017-08-03 ENCOUNTER — Other Ambulatory Visit: Payer: Self-pay | Admitting: Internal Medicine

## 2017-08-03 DIAGNOSIS — K219 Gastro-esophageal reflux disease without esophagitis: Secondary | ICD-10-CM

## 2017-08-03 MED ORDER — RANITIDINE HCL 300 MG PO TABS: 300.0000 mg | ORAL_TABLET | Freq: Every day | ORAL | 3 refills | Status: DC

## 2017-08-03 MED FILL — DIPHENOXYLATE/ATROPINE TAB: 2.5-0.025 | 30 days supply | Qty: 60 | Fill #2

## 2017-08-03 MED FILL — ESOMEPRAZOLE MAG DR 40 MG C: 40 | 90 days supply | Qty: 90 | Fill #0

## 2017-08-03 MED FILL — CELECOXIB 200 MG CAPS: 200 | 30 days supply | Qty: 60 | Fill #1

## 2017-08-03 MED FILL — raNITIdine HCL 300 MG TABS: 300 | 30 days supply | Qty: 30 | Fill #1

## 2017-08-04 DIAGNOSIS — K5190 Ulcerative colitis, unspecified, without complications: Secondary | ICD-10-CM | POA: Diagnosis not present

## 2017-09-02 MED FILL — DIPHENOXYLATE/ATROPINE TAB: 2.5-0.025 | 90 days supply | Qty: 180 | Fill #0

## 2017-09-05 DIAGNOSIS — S2001XA Contusion of right breast, initial encounter: Secondary | ICD-10-CM | POA: Diagnosis not present

## 2017-09-05 MED FILL — raNITIdine HCL 300 MG TABS: 300 | 30 days supply | Qty: 30 | Fill #2

## 2017-09-13 DIAGNOSIS — S2000XA Contusion of breast, unspecified breast, initial encounter: Secondary | ICD-10-CM | POA: Diagnosis not present

## 2017-09-15 DIAGNOSIS — K5190 Ulcerative colitis, unspecified, without complications: Secondary | ICD-10-CM | POA: Diagnosis not present

## 2017-09-15 DIAGNOSIS — M1990 Unspecified osteoarthritis, unspecified site: Secondary | ICD-10-CM | POA: Diagnosis not present

## 2017-09-27 DIAGNOSIS — M2550 Pain in unspecified joint: Secondary | ICD-10-CM | POA: Diagnosis not present

## 2017-09-27 DIAGNOSIS — K5190 Ulcerative colitis, unspecified, without complications: Secondary | ICD-10-CM | POA: Diagnosis not present

## 2017-09-27 DIAGNOSIS — M25569 Pain in unspecified knee: Secondary | ICD-10-CM | POA: Diagnosis not present

## 2017-09-27 DIAGNOSIS — M1990 Unspecified osteoarthritis, unspecified site: Secondary | ICD-10-CM | POA: Diagnosis not present

## 2017-10-04 MED FILL — CELECOXIB 200 MG CAPS: 200 | 30 days supply | Qty: 60 | Fill #0

## 2017-10-26 MED FILL — ESOMEPRAZOLE MAG DR 40 MG C: 40 | 90 days supply | Qty: 90 | Fill #1

## 2017-10-26 MED FILL — raNITIdine HCL 300 MG TABS: 300 | 30 days supply | Qty: 30 | Fill #3

## 2017-10-27 DIAGNOSIS — Z23 Encounter for immunization: Secondary | ICD-10-CM | POA: Diagnosis not present

## 2017-10-27 DIAGNOSIS — K5190 Ulcerative colitis, unspecified, without complications: Secondary | ICD-10-CM | POA: Diagnosis not present

## 2017-10-27 MED FILL — CELECOXIB 200 MG CAPS: 200 | 30 days supply | Qty: 60 | Fill #1

## 2017-11-10 DIAGNOSIS — N644 Mastodynia: Secondary | ICD-10-CM | POA: Diagnosis not present

## 2017-11-25 ENCOUNTER — Other Ambulatory Visit: Payer: Self-pay | Admitting: Internal Medicine

## 2017-11-25 MED FILL — raNITIdine HCL 300 MG TABS: 300 | 30 days supply | Qty: 30 | Fill #4

## 2017-11-25 MED FILL — CELECOXIB 200 MG CAPSULE: 200 | 30 days supply | Qty: 60 | Fill #2

## 2017-11-29 HISTORY — PX: COLONOSCOPY: SHX174

## 2017-12-08 DIAGNOSIS — K5190 Ulcerative colitis, unspecified, without complications: Secondary | ICD-10-CM | POA: Diagnosis not present

## 2017-12-21 MED FILL — IBUPROFEN 800 MG TAB: 800 | 8 days supply | Qty: 24 | Fill #0

## 2017-12-27 MED FILL — AMOXICILLIN 500 MG CAPSULE: 500 | 8 days supply | Qty: 24 | Fill #0

## 2018-01-02 ENCOUNTER — Other Ambulatory Visit: Payer: Self-pay

## 2018-01-02 DIAGNOSIS — D229 Melanocytic nevi, unspecified: Secondary | ICD-10-CM

## 2018-01-02 DIAGNOSIS — D225 Melanocytic nevi of trunk: Secondary | ICD-10-CM | POA: Diagnosis not present

## 2018-01-02 DIAGNOSIS — D485 Neoplasm of uncertain behavior of skin: Secondary | ICD-10-CM | POA: Diagnosis not present

## 2018-01-02 DIAGNOSIS — H01009 Unspecified blepharitis unspecified eye, unspecified eyelid: Secondary | ICD-10-CM | POA: Diagnosis not present

## 2018-01-02 DIAGNOSIS — L570 Actinic keratosis: Secondary | ICD-10-CM | POA: Diagnosis not present

## 2018-01-02 HISTORY — DX: Melanocytic nevi, unspecified: D22.9

## 2018-01-05 MED FILL — ESOMEPRAZOLE MAG DR 40 MG C: 40 | 90 days supply | Qty: 90 | Fill #2

## 2018-01-05 MED FILL — raNITIdine HCL 300 MG TABS: 300 | 30 days supply | Qty: 30 | Fill #5

## 2018-01-05 MED FILL — AMOXICILLIN 500 MG CAPSULE: 500 | 8 days supply | Qty: 24 | Fill #1

## 2018-02-14 DIAGNOSIS — K5190 Ulcerative colitis, unspecified, without complications: Secondary | ICD-10-CM | POA: Diagnosis not present

## 2018-02-21 DIAGNOSIS — Z79899 Other long term (current) drug therapy: Secondary | ICD-10-CM | POA: Diagnosis not present

## 2018-02-21 DIAGNOSIS — M1990 Unspecified osteoarthritis, unspecified site: Secondary | ICD-10-CM | POA: Diagnosis not present

## 2018-02-21 DIAGNOSIS — M2550 Pain in unspecified joint: Secondary | ICD-10-CM | POA: Diagnosis not present

## 2018-02-21 DIAGNOSIS — K5190 Ulcerative colitis, unspecified, without complications: Secondary | ICD-10-CM | POA: Diagnosis not present

## 2018-02-27 DIAGNOSIS — Z1231 Encounter for screening mammogram for malignant neoplasm of breast: Secondary | ICD-10-CM | POA: Diagnosis not present

## 2018-03-20 MED FILL — CELECOXIB 200 MG CAPSULE: 200 | 30 days supply | Qty: 60 | Fill #3

## 2018-03-20 MED FILL — raNITIdine HCL 300 MG TABS: 300 | 30 days supply | Qty: 30 | Fill #6

## 2018-03-28 DIAGNOSIS — K5190 Ulcerative colitis, unspecified, without complications: Secondary | ICD-10-CM | POA: Diagnosis not present

## 2018-03-29 DIAGNOSIS — S61210A Laceration without foreign body of right index finger without damage to nail, initial encounter: Secondary | ICD-10-CM | POA: Diagnosis not present

## 2018-03-29 DIAGNOSIS — W540XXA Bitten by dog, initial encounter: Secondary | ICD-10-CM | POA: Diagnosis not present

## 2018-03-29 MED FILL — AMOX TR-K CLV 875-125 MG TA: 875-125 | 10 days supply | Qty: 20 | Fill #0

## 2018-04-20 MED FILL — raNITIdine HCL 300 MG TABS: 300 | 30 days supply | Qty: 30 | Fill #7

## 2018-04-20 MED FILL — ESOMEPRAZOLE MAG DR 40 MG C: 40 | 90 days supply | Qty: 90 | Fill #3

## 2018-04-20 MED FILL — CELECOXIB 200 MG CAPSULE: 200 | 30 days supply | Qty: 60 | Fill #0

## 2018-05-11 DIAGNOSIS — K5190 Ulcerative colitis, unspecified, without complications: Secondary | ICD-10-CM | POA: Diagnosis not present

## 2018-05-22 DIAGNOSIS — M9903 Segmental and somatic dysfunction of lumbar region: Secondary | ICD-10-CM | POA: Diagnosis not present

## 2018-05-22 DIAGNOSIS — M9905 Segmental and somatic dysfunction of pelvic region: Secondary | ICD-10-CM | POA: Diagnosis not present

## 2018-05-22 DIAGNOSIS — M9902 Segmental and somatic dysfunction of thoracic region: Secondary | ICD-10-CM | POA: Diagnosis not present

## 2018-05-22 DIAGNOSIS — M5416 Radiculopathy, lumbar region: Secondary | ICD-10-CM | POA: Diagnosis not present

## 2018-05-22 MED FILL — raNITIdine HCL 300 MG TABS: 300 | 30 days supply | Qty: 30 | Fill #8

## 2018-05-22 MED FILL — CELECOXIB 200 MG CAPSULE: 200 | 30 days supply | Qty: 60 | Fill #1

## 2018-05-26 DIAGNOSIS — M9902 Segmental and somatic dysfunction of thoracic region: Secondary | ICD-10-CM | POA: Diagnosis not present

## 2018-05-26 DIAGNOSIS — M5416 Radiculopathy, lumbar region: Secondary | ICD-10-CM | POA: Diagnosis not present

## 2018-05-26 DIAGNOSIS — M9903 Segmental and somatic dysfunction of lumbar region: Secondary | ICD-10-CM | POA: Diagnosis not present

## 2018-05-26 DIAGNOSIS — M9905 Segmental and somatic dysfunction of pelvic region: Secondary | ICD-10-CM | POA: Diagnosis not present

## 2018-05-26 DIAGNOSIS — D500 Iron deficiency anemia secondary to blood loss (chronic): Secondary | ICD-10-CM | POA: Diagnosis not present

## 2018-05-30 DIAGNOSIS — Z0000 Encounter for general adult medical examination without abnormal findings: Secondary | ICD-10-CM | POA: Diagnosis not present

## 2018-05-30 DIAGNOSIS — M5416 Radiculopathy, lumbar region: Secondary | ICD-10-CM | POA: Diagnosis not present

## 2018-05-30 DIAGNOSIS — K5190 Ulcerative colitis, unspecified, without complications: Secondary | ICD-10-CM | POA: Diagnosis not present

## 2018-05-30 DIAGNOSIS — F418 Other specified anxiety disorders: Secondary | ICD-10-CM | POA: Diagnosis not present

## 2018-05-30 DIAGNOSIS — M9905 Segmental and somatic dysfunction of pelvic region: Secondary | ICD-10-CM | POA: Diagnosis not present

## 2018-05-30 DIAGNOSIS — M9903 Segmental and somatic dysfunction of lumbar region: Secondary | ICD-10-CM | POA: Diagnosis not present

## 2018-05-30 DIAGNOSIS — M1990 Unspecified osteoarthritis, unspecified site: Secondary | ICD-10-CM | POA: Diagnosis not present

## 2018-05-30 DIAGNOSIS — M9902 Segmental and somatic dysfunction of thoracic region: Secondary | ICD-10-CM | POA: Diagnosis not present

## 2018-05-30 MED FILL — buPROPion HCL ER (XL) 150 M: 150 | 30 days supply | Qty: 49 | Fill #0

## 2018-06-08 DIAGNOSIS — M9902 Segmental and somatic dysfunction of thoracic region: Secondary | ICD-10-CM | POA: Diagnosis not present

## 2018-06-08 DIAGNOSIS — M9903 Segmental and somatic dysfunction of lumbar region: Secondary | ICD-10-CM | POA: Diagnosis not present

## 2018-06-08 DIAGNOSIS — M5416 Radiculopathy, lumbar region: Secondary | ICD-10-CM | POA: Diagnosis not present

## 2018-06-19 MED FILL — raNITIdine HCL 300 MG TABS: 300 | 30 days supply | Qty: 30 | Fill #9

## 2018-06-22 ENCOUNTER — Encounter: Payer: Self-pay | Admitting: Gastroenterology

## 2018-06-22 DIAGNOSIS — K5190 Ulcerative colitis, unspecified, without complications: Secondary | ICD-10-CM | POA: Diagnosis not present

## 2018-06-22 DIAGNOSIS — Z79899 Other long term (current) drug therapy: Secondary | ICD-10-CM | POA: Diagnosis not present

## 2018-06-27 DIAGNOSIS — F418 Other specified anxiety disorders: Secondary | ICD-10-CM | POA: Diagnosis not present

## 2018-06-27 DIAGNOSIS — Z6833 Body mass index (BMI) 33.0-33.9, adult: Secondary | ICD-10-CM | POA: Diagnosis not present

## 2018-06-27 MED FILL — buPROPion HCL ER (XL) 150 M: 150 | 30 days supply | Qty: 60 | Fill #0

## 2018-06-29 DIAGNOSIS — M9903 Segmental and somatic dysfunction of lumbar region: Secondary | ICD-10-CM | POA: Diagnosis not present

## 2018-06-29 DIAGNOSIS — M9905 Segmental and somatic dysfunction of pelvic region: Secondary | ICD-10-CM | POA: Diagnosis not present

## 2018-06-29 DIAGNOSIS — M5416 Radiculopathy, lumbar region: Secondary | ICD-10-CM | POA: Diagnosis not present

## 2018-07-11 DIAGNOSIS — M2550 Pain in unspecified joint: Secondary | ICD-10-CM | POA: Diagnosis not present

## 2018-07-11 DIAGNOSIS — Z23 Encounter for immunization: Secondary | ICD-10-CM | POA: Diagnosis not present

## 2018-07-24 ENCOUNTER — Other Ambulatory Visit: Payer: Self-pay | Admitting: Gastroenterology

## 2018-07-24 ENCOUNTER — Telehealth: Payer: Self-pay | Admitting: Gastroenterology

## 2018-07-24 MED FILL — CELECOXIB 200 MG CAP: 200 | 30 days supply | Qty: 60 | Fill #2

## 2018-07-24 MED FILL — raNITIdine HCL 300 MG TABS: 300 | 90 days supply | Qty: 90 | Fill #0

## 2018-07-24 MED FILL — buPROPion HCL ER (XL) 150 M: 150 | 30 days supply | Qty: 60 | Fill #1

## 2018-07-25 MED ORDER — ESOMEPRAZOLE MAGNESIUM 40 MG PO CPDR: 40.0000 mg | DELAYED_RELEASE_CAPSULE | Freq: Every day | ORAL | 0 refills | Status: DC

## 2018-07-25 MED FILL — ESOMEPRAZOLE MAG DR 40 MG C: 40 | 90 days supply | Qty: 90 | Fill #0

## 2018-07-25 NOTE — Telephone Encounter (Signed)
Prescription sent to patient's pharmacy and patient notified to keep appt for further refills.

## 2018-08-09 DIAGNOSIS — K5190 Ulcerative colitis, unspecified, without complications: Secondary | ICD-10-CM | POA: Diagnosis not present

## 2018-08-14 DIAGNOSIS — F4320 Adjustment disorder, unspecified: Secondary | ICD-10-CM | POA: Diagnosis not present

## 2018-08-22 DIAGNOSIS — F4320 Adjustment disorder, unspecified: Secondary | ICD-10-CM | POA: Diagnosis not present

## 2018-08-24 DIAGNOSIS — M9902 Segmental and somatic dysfunction of thoracic region: Secondary | ICD-10-CM | POA: Diagnosis not present

## 2018-08-24 DIAGNOSIS — M9905 Segmental and somatic dysfunction of pelvic region: Secondary | ICD-10-CM | POA: Diagnosis not present

## 2018-08-24 DIAGNOSIS — M9903 Segmental and somatic dysfunction of lumbar region: Secondary | ICD-10-CM | POA: Diagnosis not present

## 2018-08-24 DIAGNOSIS — M5416 Radiculopathy, lumbar region: Secondary | ICD-10-CM | POA: Diagnosis not present

## 2018-08-25 ENCOUNTER — Other Ambulatory Visit: Payer: Self-pay

## 2018-08-25 DIAGNOSIS — D485 Neoplasm of uncertain behavior of skin: Secondary | ICD-10-CM | POA: Diagnosis not present

## 2018-08-25 DIAGNOSIS — D229 Melanocytic nevi, unspecified: Secondary | ICD-10-CM

## 2018-08-25 DIAGNOSIS — L988 Other specified disorders of the skin and subcutaneous tissue: Secondary | ICD-10-CM | POA: Diagnosis not present

## 2018-08-25 DIAGNOSIS — D225 Melanocytic nevi of trunk: Secondary | ICD-10-CM | POA: Diagnosis not present

## 2018-08-25 DIAGNOSIS — L570 Actinic keratosis: Secondary | ICD-10-CM | POA: Diagnosis not present

## 2018-08-25 HISTORY — DX: Melanocytic nevi, unspecified: D22.9

## 2018-09-04 ENCOUNTER — Ambulatory Visit (INDEPENDENT_AMBULATORY_CARE_PROVIDER_SITE_OTHER): Payer: BLUE CROSS/BLUE SHIELD | Admitting: Gastroenterology

## 2018-09-04 ENCOUNTER — Encounter: Payer: Self-pay | Admitting: Gastroenterology

## 2018-09-04 VITALS — BP 110/80 | HR 88 | Ht 63.78 in | Wt 199.0 lb

## 2018-09-04 DIAGNOSIS — K5100 Ulcerative (chronic) pancolitis without complications: Secondary | ICD-10-CM | POA: Diagnosis not present

## 2018-09-04 DIAGNOSIS — K219 Gastro-esophageal reflux disease without esophagitis: Secondary | ICD-10-CM | POA: Diagnosis not present

## 2018-09-04 MED ORDER — NA SULFATE-K SULFATE-MG SULF 17.5-3.13-1.6 GM/177ML PO SOLN: 1.0000 | Freq: Once | ORAL | 0 refills | Status: AC

## 2018-09-04 MED ORDER — ESOMEPRAZOLE MAGNESIUM 40 MG PO CPDR: 40.0000 mg | DELAYED_RELEASE_CAPSULE | Freq: Every day | ORAL | 3 refills | Status: DC

## 2018-09-04 MED ORDER — FAMOTIDINE 40 MG PO TABS: 40.0000 mg | ORAL_TABLET | Freq: Every day | ORAL | 3 refills | Status: DC

## 2018-09-04 MED FILL — FAMOTIDINE 40 MG TABLET: 40 | 90 days supply | Qty: 90 | Fill #0

## 2018-09-04 MED FILL — SUPREP BOWEL PREP KIT: 17.5-3.13-1 | 2 days supply | Qty: 354 | Fill #0

## 2018-09-04 NOTE — Progress Notes (Signed)
    History of Present Illness: This is a 52 year old female with ulcerative colitis maintained on Remicade infusions.  She relates that she had mild LFT elevations from blood work at [facility].  We are waiting on those blood test results.  Her reflux is well controlled on her current regimen.  She has no active colitis symptoms. She is due for a 2-year surveillance colonoscopy.  Current Medications, Allergies, Past Medical History, Past Surgical History, Family History and Social History were reviewed in [employer] record.  Physical Exam: General: Well developed, well nourished, no acute distress Head: Normocephalic and atraumatic Eyes:  sclerae anicteric, EOMI Ears: Normal auditory acuity Mouth: No deformity or lesions Lungs: Clear throughout to auscultation Heart: Regular rate and rhythm; no murmurs, rubs or bruits Abdomen: Soft, non tender and non distended. No masses, hepatosplenomegaly or hernias noted. Normal Bowel sounds Rectal: Not done Musculoskeletal: Symmetrical with no gross deformities  Pulses:  Normal pulses noted Extremities: No clubbing, cyanosis, edema or deformities noted Neurological: Alert oriented x 4, grossly nonfocal Psychological:  Alert and cooperative. Normal mood and affect   Assessment and Recommendations:  1. Ulcerative colitis. Request blood work from [facility]. Continue Remicade. Schedule colonoscopy. The risks (including bleeding, perforation, infection, missed lesions, medication reactions and possible hospitalization or surgery if complications occur), benefits, and alternatives to colonoscopy with possible biopsy and possible polypectomy were discussed with the patient and they consent to proceed.   2. GERD. Nexium 40 mg po qam, famotidine 40 mg po qpm.   3. History of iron deficiency anemia.   4. Patient reports elevated LFTs. Request blood work from [facility].

## 2018-09-04 NOTE — Patient Instructions (Addendum)
We have sent the following medications to your pharmacy for you to pick up at your convenience: Nexium and famotidine.  You have been scheduled for a colonoscopy. Please follow written instructions given to you at your visit today.  Please pick up your prep supplies at the pharmacy within the next 1-3 days. If you use inhalers (even only as needed), please bring them with you on the day of your procedure. Your physician has requested that you go to www.startemmi.com and enter the access code given to you at your visit today. This web site gives a general overview about your procedure. However, you should still follow specific instructions given to you by our office regarding your preparation for the procedure.     Thank you for choosing me and [facility].  Pricilla Riffle. Dagoberto Ligas., MD., Marval Regal

## 2018-09-05 DIAGNOSIS — F4320 Adjustment disorder, unspecified: Secondary | ICD-10-CM | POA: Diagnosis not present

## 2018-09-14 ENCOUNTER — Other Ambulatory Visit: Payer: Self-pay

## 2018-09-14 DIAGNOSIS — C434 Malignant melanoma of scalp and neck: Secondary | ICD-10-CM | POA: Diagnosis not present

## 2018-09-14 DIAGNOSIS — D225 Melanocytic nevi of trunk: Secondary | ICD-10-CM | POA: Diagnosis not present

## 2018-09-14 DIAGNOSIS — D235 Other benign neoplasm of skin of trunk: Secondary | ICD-10-CM | POA: Diagnosis not present

## 2018-09-14 DIAGNOSIS — C439 Malignant melanoma of skin, unspecified: Secondary | ICD-10-CM

## 2018-09-14 DIAGNOSIS — D034 Melanoma in situ of scalp and neck: Secondary | ICD-10-CM | POA: Diagnosis not present

## 2018-09-14 DIAGNOSIS — C4359 Malignant melanoma of other part of trunk: Secondary | ICD-10-CM | POA: Diagnosis not present

## 2018-09-14 HISTORY — DX: Malignant melanoma of skin, unspecified: C43.9

## 2018-09-15 MED FILL — buPROPion HCL ER (XL) 150 M: 150 | 30 days supply | Qty: 60 | Fill #2

## 2018-09-15 MED FILL — CELECOXIB 200 MG CAP: 200 | 30 days supply | Qty: 60 | Fill #0

## 2018-09-26 DIAGNOSIS — F4320 Adjustment disorder, unspecified: Secondary | ICD-10-CM | POA: Diagnosis not present

## 2018-09-26 DIAGNOSIS — K5190 Ulcerative colitis, unspecified, without complications: Secondary | ICD-10-CM | POA: Diagnosis not present

## 2018-10-03 DIAGNOSIS — F4320 Adjustment disorder, unspecified: Secondary | ICD-10-CM | POA: Diagnosis not present

## 2018-10-06 ENCOUNTER — Encounter: Payer: Self-pay | Admitting: Gastroenterology

## 2018-10-06 ENCOUNTER — Ambulatory Visit (AMBULATORY_SURGERY_CENTER): Payer: BLUE CROSS/BLUE SHIELD | Admitting: Gastroenterology

## 2018-10-06 VITALS — BP 127/68 | HR 69 | Temp 97.7°F | Resp 19 | Ht 63.78 in | Wt 199.0 lb

## 2018-10-06 DIAGNOSIS — K5100 Ulcerative (chronic) pancolitis without complications: Secondary | ICD-10-CM | POA: Diagnosis not present

## 2018-10-06 DIAGNOSIS — K5140 Inflammatory polyps of colon without complications: Secondary | ICD-10-CM | POA: Diagnosis not present

## 2018-10-06 DIAGNOSIS — K635 Polyp of colon: Secondary | ICD-10-CM

## 2018-10-06 DIAGNOSIS — Z86010 Personal history of colonic polyps: Secondary | ICD-10-CM

## 2018-10-06 DIAGNOSIS — D128 Benign neoplasm of rectum: Secondary | ICD-10-CM

## 2018-10-06 DIAGNOSIS — K5190 Ulcerative colitis, unspecified, without complications: Secondary | ICD-10-CM | POA: Diagnosis not present

## 2018-10-06 DIAGNOSIS — K621 Rectal polyp: Secondary | ICD-10-CM

## 2018-10-06 DIAGNOSIS — D123 Benign neoplasm of transverse colon: Secondary | ICD-10-CM

## 2018-10-06 MED ORDER — SODIUM CHLORIDE 0.9 % IV SOLN: 500.0000 mL | Freq: Once | INTRAVENOUS | Status: DC

## 2018-10-06 NOTE — Op Note (Signed)
[place] Patient Name: Melissa Cohen Procedure Date: 10/06/2018 4:07 PM MRN: [id number] Endoscopist: Ladene Artist , MD Age: 52 Referring MD:  Date of Birth: February 02, 1966 Gender: Female Account #: [id number] Procedure:                Colonoscopy Indications:              High risk colon cancer surveillance: Ulcerative                            pancolitis of 8 (or more) years duration Medicines:                Monitored Anesthesia Care Procedure:                Pre-Anesthesia Assessment:                           - Prior to the procedure, a History and Physical                            was performed, and patient medications and                            allergies were reviewed. The patient's tolerance of                            previous anesthesia was also reviewed. The risks                            and benefits of the procedure and the sedation                            options and risks were discussed with the patient.                            All questions were answered, and informed consent                            was obtained. Prior Anticoagulants: The patient has                            taken no previous anticoagulant or antiplatelet                            agents. ASA Grade Assessment: II - A patient with                            mild systemic disease. After reviewing the risks                            and benefits, the patient was deemed in                            satisfactory condition to undergo the procedure.  After obtaining informed consent, the colonoscope                            was passed under direct vision. Throughout the                            procedure, the patient's blood pressure, pulse, and                            oxygen saturations were monitored continuously. The                            Colonoscope was introduced through the anus and                            advanced to the the  cecum, identified by                            appendiceal orifice and ileocecal valve. The                            ileocecal valve, appendiceal orifice, and rectum                            were photographed. The quality of the bowel                            preparation was good. The colonoscopy was performed                            without difficulty. The patient tolerated the                            procedure well. Scope In: 4:13:46 PM Scope Out: 4:27:33 PM Scope Withdrawal Time: 0 hours 11 minutes 45 seconds  Total Procedure Duration: 0 hours 13 minutes 47 seconds  Findings:                 The perianal and digital rectal examinations were                            normal.                           Two sessile polyps were found in the transverse                            colon. The polyps were 6 to 9 mm in size. These                            polyps were removed with a cold snare. Resection                            and retrieval were complete.  A 4 mm polyp was found in the rectum. The polyp was                            sessile. The polyp was removed with a cold biopsy                            forceps. Resection and retrieval were complete.                           Inflammation characterized by congestion (edema),                            erythema and scarring was found as patches                            surrounded by small areas of normal mucosa from the                            rectum and at the cecum. This was graded as Mayo                            Score 1 (mild, with erythema, decreased vascular                            pattern, mild friability), and when compared to                            previous examinations, the findings are improved.                            Pseudopolyps from the rectum to the descending                            colon. Biopsies were taken with a cold forceps for                             histology.                           Internal hemorrhoids were found during                            retroflexion. The hemorrhoids were small and Grade                            I (internal hemorrhoids that do not prolapse).                           The exam was otherwise without abnormality on                            direct and retroflexion views. Complications:            No immediate complications. Estimated blood loss:  None. Estimated Blood Loss:     Estimated blood loss: none. Impression:               - One 4 mm polyp in the rectum, removed with a cold                            biopsy forceps. Resected and retrieved.                           - Two 6 to 9 mm polyps in the transverse colon,                            removed with a cold snare. Resected and retrieved.                           - Pancolitis ulcerative colitis. Inflammation was                            found in the rectum and at the cecum. This was                            graded as Mayo Score 1 (mild disease), improved                            compared to previous examinations. Biopsied.                           - Internal hemorrhoids.                           - The examination was otherwise normal on direct                            and retroflexion views. Recommendation:           - Repeat colonoscopy in 2 years for surveillance.                           - Patient has a contact number available for                            emergencies. The signs and symptoms of potential                            delayed complications were discussed with the                            patient. Return to normal activities tomorrow.                            Written discharge instructions were provided to the                            patient.                           -  Resume previous diet.                           - Continue present medications.                           - Await  pathology results. Ladene Artist, MD 10/06/2018 4:34:34 PM This report has been signed electronically.

## 2018-10-06 NOTE — Progress Notes (Signed)
Report to PACU, RN, vss, BBS= Clear.  

## 2018-10-06 NOTE — Progress Notes (Signed)
Called to room to assist during endoscopic procedure.  Patient ID and intended procedure confirmed with present staff. Received instructions for my participation in the procedure from the performing physician.  

## 2018-10-06 NOTE — Patient Instructions (Signed)
Impression/Recommendations:  Polyp handout given to patient. Ulcerative colitis handout given to patient. Hemorrhoid handout given to patient.  Repeat colonoscopy in 2 years for surveillance.  Resume previous diet. Continue present medications.  Await pathology results.  YOU HAD AN ENDOSCOPIC PROCEDURE TODAY AT [facility]:   Refer to the procedure report that was given to you for any specific questions about what was found during the examination.  If the procedure report does not answer your questions, please call your gastroenterologist to clarify.  If you requested that your care partner not be given the details of your procedure findings, then the procedure report has been included in a sealed envelope for you to review at your convenience later.  YOU SHOULD EXPECT: Some feelings of bloating in the abdomen. Passage of more gas than usual.  Walking can help get rid of the air that was put into your GI tract during the procedure and reduce the bloating. If you had a lower endoscopy (such as a colonoscopy or flexible sigmoidoscopy) you may notice spotting of blood in your stool or on the toilet paper. If you underwent a bowel prep for your procedure, you may not have a normal bowel movement for a few days.  Please Note:  You might notice some irritation and congestion in your nose or some drainage.  This is from the oxygen used during your procedure.  There is no need for concern and it should clear up in a day or so.  SYMPTOMS TO REPORT IMMEDIATELY:   Following lower endoscopy (colonoscopy or flexible sigmoidoscopy):  Excessive amounts of blood in the stool  Significant tenderness or worsening of abdominal pains  Swelling of the abdomen that is new, acute  Fever of 100F or higher For urgent or emergent issues, a gastroenterologist can be reached at any hour by calling [phone].   DIET:  We do recommend a small meal at first, but then you may proceed to your  regular diet.  Drink plenty of fluids but you should avoid alcoholic beverages for 24 hours.  ACTIVITY:  You should plan to take it easy for the rest of today and you should NOT DRIVE or use heavy machinery until tomorrow (because of the sedation medicines used during the test).    FOLLOW UP: Our staff will call the number listed on your records the next business day following your procedure to check on you and address any questions or concerns that you may have regarding the information given to you following your procedure. If we do not reach you, we will leave a message.  However, if you are feeling well and you are not experiencing any problems, there is no need to return our call.  We will assume that you have returned to your regular daily activities without incident.  If any biopsies were taken you will be contacted by phone or by letter within the next 1-3 weeks.  Please call us at [phone] if you have not heard about the biopsies in 3 weeks.    SIGNATURES/CONFIDENTIALITY: You and/or your care partner have signed paperwork which will be entered into your electronic medical record.  These signatures attest to the fact that that the information above on your After Visit Summary has been reviewed and is understood.  Full responsibility of the confidentiality of this discharge information lies with you and/or your care-partner.

## 2018-10-09 ENCOUNTER — Telehealth: Payer: Self-pay

## 2018-10-09 NOTE — Telephone Encounter (Signed)
  Follow up Call-  Call back number 10/06/2018 11/09/2016 05/13/2016  Post procedure Call Back phone  # [phone] cell [phone]  Permission to leave phone message Yes Yes Yes  Some recent data might be hidden     Patient questions:  Do you have a fever, pain , or abdominal swelling? No. Pain Score  0 *  Have you tolerated food without any problems? Yes.    Have you been able to return to your normal activities? Yes.    Do you have any questions about your discharge instructions: Diet   No. Medications  No. Follow up visit  No.  Do you have questions or concerns about your Care? No.  Actions: * If pain score is 4 or above: No action needed, pain <4.

## 2018-10-10 DIAGNOSIS — F4320 Adjustment disorder, unspecified: Secondary | ICD-10-CM | POA: Diagnosis not present

## 2018-10-18 DIAGNOSIS — M545 Low back pain: Secondary | ICD-10-CM | POA: Diagnosis not present

## 2018-10-19 DIAGNOSIS — M9902 Segmental and somatic dysfunction of thoracic region: Secondary | ICD-10-CM | POA: Diagnosis not present

## 2018-10-19 DIAGNOSIS — M5416 Radiculopathy, lumbar region: Secondary | ICD-10-CM | POA: Diagnosis not present

## 2018-10-19 DIAGNOSIS — M9905 Segmental and somatic dysfunction of pelvic region: Secondary | ICD-10-CM | POA: Diagnosis not present

## 2018-10-19 DIAGNOSIS — M9903 Segmental and somatic dysfunction of lumbar region: Secondary | ICD-10-CM | POA: Diagnosis not present

## 2018-10-20 MED FILL — ESOMEPRAZOLE MAG DR 40 MG C: 40 | 90 days supply | Qty: 90 | Fill #0

## 2018-10-20 MED FILL — buPROPion HCL ER (XL) 150 M: 150 | 30 days supply | Qty: 60 | Fill #3

## 2018-10-20 MED FILL — CELECOXIB 200 MG CAP: 200 | 30 days supply | Qty: 60 | Fill #1

## 2018-10-23 DIAGNOSIS — M9902 Segmental and somatic dysfunction of thoracic region: Secondary | ICD-10-CM | POA: Diagnosis not present

## 2018-10-23 DIAGNOSIS — M9905 Segmental and somatic dysfunction of pelvic region: Secondary | ICD-10-CM | POA: Diagnosis not present

## 2018-10-23 DIAGNOSIS — M9903 Segmental and somatic dysfunction of lumbar region: Secondary | ICD-10-CM | POA: Diagnosis not present

## 2018-10-23 DIAGNOSIS — M5416 Radiculopathy, lumbar region: Secondary | ICD-10-CM | POA: Diagnosis not present

## 2018-10-24 ENCOUNTER — Encounter: Payer: Self-pay | Admitting: Gastroenterology

## 2018-10-24 DIAGNOSIS — F4320 Adjustment disorder, unspecified: Secondary | ICD-10-CM | POA: Diagnosis not present

## 2018-10-25 DIAGNOSIS — M5416 Radiculopathy, lumbar region: Secondary | ICD-10-CM | POA: Diagnosis not present

## 2018-10-25 DIAGNOSIS — M9905 Segmental and somatic dysfunction of pelvic region: Secondary | ICD-10-CM | POA: Diagnosis not present

## 2018-10-25 DIAGNOSIS — M9903 Segmental and somatic dysfunction of lumbar region: Secondary | ICD-10-CM | POA: Diagnosis not present

## 2018-10-25 DIAGNOSIS — M9902 Segmental and somatic dysfunction of thoracic region: Secondary | ICD-10-CM | POA: Diagnosis not present

## 2018-11-13 ENCOUNTER — Other Ambulatory Visit: Payer: Self-pay | Admitting: Dermatology

## 2018-11-13 DIAGNOSIS — M9905 Segmental and somatic dysfunction of pelvic region: Secondary | ICD-10-CM | POA: Diagnosis not present

## 2018-11-13 DIAGNOSIS — M9903 Segmental and somatic dysfunction of lumbar region: Secondary | ICD-10-CM | POA: Diagnosis not present

## 2018-11-13 DIAGNOSIS — C434 Malignant melanoma of scalp and neck: Secondary | ICD-10-CM | POA: Diagnosis not present

## 2018-11-13 DIAGNOSIS — M5416 Radiculopathy, lumbar region: Secondary | ICD-10-CM | POA: Diagnosis not present

## 2018-11-13 DIAGNOSIS — M9902 Segmental and somatic dysfunction of thoracic region: Secondary | ICD-10-CM | POA: Diagnosis not present

## 2018-11-14 DIAGNOSIS — K5190 Ulcerative colitis, unspecified, without complications: Secondary | ICD-10-CM | POA: Diagnosis not present

## 2018-11-16 DIAGNOSIS — M9903 Segmental and somatic dysfunction of lumbar region: Secondary | ICD-10-CM | POA: Diagnosis not present

## 2018-11-16 DIAGNOSIS — M9905 Segmental and somatic dysfunction of pelvic region: Secondary | ICD-10-CM | POA: Diagnosis not present

## 2018-11-16 DIAGNOSIS — M5416 Radiculopathy, lumbar region: Secondary | ICD-10-CM | POA: Diagnosis not present

## 2018-11-16 DIAGNOSIS — M9902 Segmental and somatic dysfunction of thoracic region: Secondary | ICD-10-CM | POA: Diagnosis not present

## 2018-11-17 DIAGNOSIS — M1990 Unspecified osteoarthritis, unspecified site: Secondary | ICD-10-CM | POA: Diagnosis not present

## 2018-11-17 DIAGNOSIS — K5190 Ulcerative colitis, unspecified, without complications: Secondary | ICD-10-CM | POA: Diagnosis not present

## 2018-11-17 DIAGNOSIS — Z79899 Other long term (current) drug therapy: Secondary | ICD-10-CM | POA: Diagnosis not present

## 2018-11-17 DIAGNOSIS — M2550 Pain in unspecified joint: Secondary | ICD-10-CM | POA: Diagnosis not present

## 2018-11-17 DIAGNOSIS — Z23 Encounter for immunization: Secondary | ICD-10-CM | POA: Diagnosis not present

## 2018-11-20 DIAGNOSIS — M9903 Segmental and somatic dysfunction of lumbar region: Secondary | ICD-10-CM | POA: Diagnosis not present

## 2018-11-20 DIAGNOSIS — M9905 Segmental and somatic dysfunction of pelvic region: Secondary | ICD-10-CM | POA: Diagnosis not present

## 2018-11-20 DIAGNOSIS — M9902 Segmental and somatic dysfunction of thoracic region: Secondary | ICD-10-CM | POA: Diagnosis not present

## 2018-11-20 DIAGNOSIS — M5416 Radiculopathy, lumbar region: Secondary | ICD-10-CM | POA: Diagnosis not present

## 2018-11-24 DIAGNOSIS — M9905 Segmental and somatic dysfunction of pelvic region: Secondary | ICD-10-CM | POA: Diagnosis not present

## 2018-11-24 DIAGNOSIS — M9903 Segmental and somatic dysfunction of lumbar region: Secondary | ICD-10-CM | POA: Diagnosis not present

## 2018-11-24 DIAGNOSIS — M9902 Segmental and somatic dysfunction of thoracic region: Secondary | ICD-10-CM | POA: Diagnosis not present

## 2018-11-24 DIAGNOSIS — M5416 Radiculopathy, lumbar region: Secondary | ICD-10-CM | POA: Diagnosis not present

## 2018-11-27 ENCOUNTER — Other Ambulatory Visit: Payer: Self-pay | Admitting: Dermatology

## 2018-11-27 DIAGNOSIS — D0359 Melanoma in situ of other part of trunk: Secondary | ICD-10-CM | POA: Diagnosis not present

## 2018-12-01 DIAGNOSIS — M5416 Radiculopathy, lumbar region: Secondary | ICD-10-CM | POA: Diagnosis not present

## 2018-12-01 DIAGNOSIS — M9903 Segmental and somatic dysfunction of lumbar region: Secondary | ICD-10-CM | POA: Diagnosis not present

## 2018-12-01 DIAGNOSIS — M9905 Segmental and somatic dysfunction of pelvic region: Secondary | ICD-10-CM | POA: Diagnosis not present

## 2018-12-01 DIAGNOSIS — M9902 Segmental and somatic dysfunction of thoracic region: Secondary | ICD-10-CM | POA: Diagnosis not present

## 2018-12-15 DIAGNOSIS — M9902 Segmental and somatic dysfunction of thoracic region: Secondary | ICD-10-CM | POA: Diagnosis not present

## 2018-12-15 DIAGNOSIS — M5416 Radiculopathy, lumbar region: Secondary | ICD-10-CM | POA: Diagnosis not present

## 2018-12-15 DIAGNOSIS — M9905 Segmental and somatic dysfunction of pelvic region: Secondary | ICD-10-CM | POA: Diagnosis not present

## 2018-12-15 DIAGNOSIS — M9903 Segmental and somatic dysfunction of lumbar region: Secondary | ICD-10-CM | POA: Diagnosis not present

## 2018-12-26 DIAGNOSIS — K5190 Ulcerative colitis, unspecified, without complications: Secondary | ICD-10-CM | POA: Diagnosis not present

## 2019-01-04 ENCOUNTER — Other Ambulatory Visit: Payer: Self-pay

## 2019-01-04 DIAGNOSIS — D225 Melanocytic nevi of trunk: Secondary | ICD-10-CM | POA: Diagnosis not present

## 2019-01-04 DIAGNOSIS — D485 Neoplasm of uncertain behavior of skin: Secondary | ICD-10-CM | POA: Diagnosis not present

## 2019-01-04 DIAGNOSIS — D235 Other benign neoplasm of skin of trunk: Secondary | ICD-10-CM | POA: Diagnosis not present

## 2019-01-04 DIAGNOSIS — D224 Melanocytic nevi of scalp and neck: Secondary | ICD-10-CM | POA: Diagnosis not present

## 2019-01-08 ENCOUNTER — Ambulatory Visit (INDEPENDENT_AMBULATORY_CARE_PROVIDER_SITE_OTHER)
Admission: RE | Admit: 2019-01-08 | Discharge: 2019-01-08 | Disposition: A | Discharge status: Home / Self Care | Payer: BLUE CROSS/BLUE SHIELD | Source: Ambulatory Visit | Attending: Internal Medicine | Admitting: Internal Medicine

## 2019-01-08 ENCOUNTER — Ambulatory Visit (INDEPENDENT_AMBULATORY_CARE_PROVIDER_SITE_OTHER): Payer: BLUE CROSS/BLUE SHIELD | Admitting: Internal Medicine

## 2019-01-08 ENCOUNTER — Encounter: Payer: Self-pay | Admitting: Internal Medicine

## 2019-01-08 VITALS — BP 118/80 | HR 100 | Ht 64.0 in | Wt 198.0 lb

## 2019-01-08 DIAGNOSIS — R05 Cough: Secondary | ICD-10-CM

## 2019-01-08 DIAGNOSIS — R053 Chronic cough: Secondary | ICD-10-CM

## 2019-01-08 MED ORDER — PREDNISONE 10 MG PO TABS: ORAL_TABLET | ORAL | 0 refills | Status: DC

## 2019-01-08 MED ORDER — ACETAMINOPHEN-CODEINE #3 300-30 MG PO TABS: 1.0000 | ORAL_TABLET | ORAL | 0 refills | Status: AC | PRN

## 2019-01-08 NOTE — Progress Notes (Signed)
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    History of Present Illness  04/02/2016 1st [facility] office visit/ [facility]  maint rx symb 160/saba/astelin Chief Complaint  Patient presents with  . Pulmonary Consult    Referred by Dr. Annye Asa. Pt c/o cough since Feb 2017- non prod and triggered by talking and taking a deep breath. She also c/o DOE with walking up hill. She had been having some pain under right breast- lasted approx 2 wks.   abrupt onset in February 2017 on last day of cruise typical in every way to prev episodes though more severe and no response to rx for rhinitis/asthma rx/3 rounds of prednisone. Dr Starling Manns eval 03/17/16: Spirometry: FVC was 2.82 L and FEV1 was 2.48 L (95% predicted) without significant postbronchodilator improvement. Epicutaneous testing: Negative despite a positive histamine control. Intradermal testing: Negative despite a positive histamine control.neg allergy eval    rec add antihistmine 1 and 1/ continue symbicort 160 >>no response Does not wake her in am/ starts after stirs x first hour, worse with talking or taking a deep breath,   Assoc with doe x hills  No change with meals or smells  rec Whenever coughing/ cough into the flutter valve to prevent airway trauma First take delsym two tsp every 12 hours and supplement if needed with  Tylenol #3   up to 2 every 4 hours to  suppress the urge to cough at all or even clear your throat.   Prednisone 10 mg take  4 each am x 2 days,   2 each am x 2 days,  1 each am x 2 days and stop (this is to eliminate allergies and inflammation from coughing) Protonix (pantoprazole) Take 30-60 min before first meal of the day and Pepcid 20 mg one bedtime plus chlorpheniramine 4 mg x 2 at bedtime (both available over the counter)  until cough is completely gone for at least a week without the need for cough suppression    01/08/2019 acute extended ov/[facility] re: recurrent uacs  Chief Complaint  Patient presents with  . Acute Visit     She states the cough got better and came back this past december. She has tried deslym and mucinex.   cough resolved 100% and maint on nexium 40 mg ac and zantac > pepcid and started chlorpheniramine 4 mg 2 qam did seem to  help the tickle in fall of 2019 then got off ship Dec 14th 2019  and started coughing 16th after jet travel assoc with several days of nasal congestion/ scratchy throat then coughing ever since Coughs night and day./ min mucoid  No rx attempted yet " I though it would just go away but it wasn't"  Not limited by breathing from desired activities    No obvious day to day or daytime variability or assoc excess/ purulent sputum or mucus plugs or hemoptysis or cp or chest tightness, subjective wheeze or  overt sinus or hb symptoms.   Sleep without nocturnal  or early am exacerbation  of respiratory  c/o's or need for noct saba. Also denies any obvious fluctuation of symptoms with weather or environmental changes or other aggravating or alleviating factors except as outlined above.  No unusual exposure hx or h/o childhood pna/ asthma or knowledge of premature birth.  Current Allergies, Complete Past Medical History, Past Surgical History, Family History, and Social History were reviewed in [employer] record.  ROS  The following are not active complaints unless  bolded Hoarseness, sore throat, dysphagia, dental problems, itching, sneezing,  nasal congestion or discharge of excess mucus or purulent secretions, ear ache,   fever, chills, sweats, unintended wt loss or wt gain, classically pleuritic or exertional cp,  orthopnea pnd or arm/hand swelling  or leg swelling, presyncope, palpitations, abdominal pain, anorexia, nausea, vomiting, diarrhea  or change in bowel habits or change in bladder habits, change in stools or change in urine, dysuria, hematuria,  rash, arthralgias, visual complaints, headache, numbness, weakness or ataxia or problems with walking or coordination,  change in mood or  memory.        Current Meds  Medication Sig  . acetaminophen (TYLENOL) 500 MG tablet Take 1,000 mg by mouth every 6 (six) hours as needed for headache.  . albuterol (PROVENTIL HFA;VENTOLIN HFA) 108 (90 Base) MCG/ACT inhaler Inhale 2 puffs into the lungs every 6 (six) hours as needed for wheezing or shortness of breath.  Marland Kitchen buPROPion (WELLBUTRIN XL) 150 MG 24 hr tablet   . CALCIUM PO Take 1,500 mg by mouth daily.  . celecoxib (CELEBREX) 200 MG capsule Take 200 mg by mouth 2 (two) times daily as needed.   . diphenoxylate-atropine (LOMOTIL) 2.5-0.025 MG tablet Take 1 tablet by mouth 2 (two) times daily. (Patient taking differently: Take 1 tablet by mouth as needed. )  . esomeprazole (NEXIUM) 40 MG capsule Take 1 capsule (40 mg total) by mouth daily.  . famotidine (PEPCID) 40 MG tablet Take 1 tablet (40 mg total) by mouth daily.  Marland Kitchen GLUCOSAMINE-MSM-HYALURONIC ACD PO Take 1 tablet by mouth daily.  Marland Kitchen glycopyrrolate (ROBINUL) 2 MG tablet Take 1 tablet (2 mg total) by mouth 2 (two) times daily.  Marland Kitchen inFLIXimab (REMICADE) 100 MG injection Inject into the vein.  Marland Kitchen inFLIXimab 500 mg in sodium chloride 0.9 % 200 mL Inject 500 mg into the vein every 6 (six) weeks. 448 mg  . OVER THE COUNTER MEDICATION Take 1 tablet by mouth daily. Juice plus  . Probiotic Product (VSL#3) CAPS Take 1  capsule by mouth 2 (two) times daily.                     Objective:   Physical Exam   amb wf nad   01/08/2019       198   04/02/16 197 lb (89.359 kg)  03/17/16 200 lb 4 oz (90.833 kg)  03/16/16 200 lb 4 oz (90.833 kg)     Vital signs reviewed - Note on arrival 02 sats  98% on RA    HEENT: nl dentition, turbinates bilaterally, and oropharynx. Nl external ear canals without cough reflex   NECK :  without JVD/Nodes/TM/ nl carotid upstrokes bilaterally   LUNGS: no acc muscle use,  Nl contour chest which is clear to A and P bilaterally without cough on insp or exp maneuvers   CV:  RRR  no s3 or murmur or increase in P2, and no  edema   ABD:  soft and nontender with nl inspiratory excursion in the supine position. No bruits or organomegaly appreciated, bowel sounds nl  MS:  Nl gait/ ext warm without deformities, calf tenderness, cyanosis or clubbing No obvious joint restrictions   SKIN: warm and dry without lesions    NEURO:  alert, approp, nl sensorium with  no motor or cerebellar deficits apparent.         CXR PA and Lateral:   01/08/2019 :    I personally reviewed images and agree with radiology impression as follows:    No acute abnormality of the lungs.  No focal airspace opacity.        Assessment & Plan:

## 2019-01-08 NOTE — Patient Instructions (Addendum)
The key to effective treatment for your cough is eliminating the non-stop cycle of cough you're stuck in long enough to let your airway heal completely and then see if there is anything still making you cough once you stop the cough suppression, but this should take no more than 5 days to figure out  Whenever coughing/ cough into the flutter valve to prevent airway trauma  First take delsym two tsp every 12 hours and supplement if needed with  Tylenol #3   up to 2 every 4 hours to suppress the urge to cough at all or even clear your throat. Swallowing water or using ice chips/non mint and menthol containing candies (such as lifesavers or sugarless jolly ranchers) are also effective.  You should rest your voice and avoid activities that you know make you cough.  Once you have eliminated the cough for 3 straight days try reducing the tylenol #3 first,  then the delsym as tolerated.    Prednisone 10 mg take  4 each am x 2 days,   2 each am x 2 days,  1 each am x 2 days and stop (this is to eliminate allergies and inflammation from coughing)  Nexium 40 mg  Take 30-60 min before first meal of the day and Pepcid 20 mg after supper  plus chlorpheniramine 4 mg x 2 at bedtime (both available over the counter)  until cough is completely gone for at least a week without the need for cough suppression  GERD (REFLUX)  is an extremely common cause of respiratory symptoms, many times with no significant heartburn at all.    It can be treated with medication, but also with lifestyle changes including avoidance of late meals, excessive alcohol, smoking cessation, and avoid fatty foods, chocolate, peppermint, colas, red wine, and acidic juices such as orange juice.  NO MINT OR MENTHOL PRODUCTS SO NO COUGH DROPS   USE HARD CANDY INSTEAD (jolley ranchers or Stover's or Lifesavers (all available in sugarless versions) NO OIL BASED VITAMINS - use powdered substitutes.   Please remember to go to the  x-ray department   for your tests - we will call you with the results when they are available     If better tell your friends, if not return here in 2 weeks

## 2019-01-09 NOTE — Progress Notes (Signed)
Left detailed msg on vm with results

## 2019-01-10 ENCOUNTER — Encounter: Payer: Self-pay | Admitting: Internal Medicine

## 2019-01-10 NOTE — Assessment & Plan Note (Addendum)
Onset of severe refractory cough episodes feb 2017 D/c all inhalers 01/03/538 and rx for cyclical cough with Tylenol #3 and flutter valve> resolved - recurred Dec 2019 p cruise and commercial air travel > cyclical cough rx 7/67/3419   Chronic cough p apparent viral uri either from cruise ship or jet.   Of the three most common causes of  Sub-acute / recurrent or chronic cough, only one (GERD)  can actually contribute to/ trigger  the other two (asthma and post nasal drip syndrome)  and perpetuate the cylce of cough.  While not intuitively obvious, many patients with chronic low grade reflux do not cough until there is a primary insult that disturbs the protective epithelial barrier and exposes sensitive nerve endings.   This is typically viral but can due to PNDS and  either may apply here.     >>> The point is that once this occurs, it is difficult to eliminate the cycle  using anything but a maximally effective acid suppression regimen at least in the short run, accompanied by an appropriate diet to address non acid GERD and control / eliminate the cough itself for at least 3 days with codeine and one short course of prednisone plus eliminate all pnds with 1st gen H1 blockers per guidelines   then regroup if not better in  2 weeks  I had an extended discussion with the patient reviewing all relevant studies completed to date and  lasting 25 minutes of a 40  minute acute office  visit with pt not seen in over 2 years     re  severe non-specific but potentially very serious refractory respiratory symptoms of uncertain and potentially multiple  etiologies.   Each maintenance medication was reviewed in detail including most importantly the difference between maintenance and prns and under what circumstances the prns are to be triggered using an action plan format that is not reflected in the computer generated alphabetically organized AVS.    Please see AVS for specific instructions unique to this  office visit that I personally wrote and verbalized to the the pt in detail and then reviewed with pt  by my nurse highlighting any changes in therapy/plan of care  recommended at today's visit.

## 2019-01-12 DIAGNOSIS — M9902 Segmental and somatic dysfunction of thoracic region: Secondary | ICD-10-CM | POA: Diagnosis not present

## 2019-01-12 DIAGNOSIS — M5416 Radiculopathy, lumbar region: Secondary | ICD-10-CM | POA: Diagnosis not present

## 2019-01-12 DIAGNOSIS — M9905 Segmental and somatic dysfunction of pelvic region: Secondary | ICD-10-CM | POA: Diagnosis not present

## 2019-01-12 DIAGNOSIS — M9903 Segmental and somatic dysfunction of lumbar region: Secondary | ICD-10-CM | POA: Diagnosis not present

## 2019-01-19 DIAGNOSIS — E782 Mixed hyperlipidemia: Secondary | ICD-10-CM | POA: Diagnosis not present

## 2019-01-19 DIAGNOSIS — E559 Vitamin D deficiency, unspecified: Secondary | ICD-10-CM | POA: Diagnosis not present

## 2019-01-19 DIAGNOSIS — E8881 Metabolic syndrome: Secondary | ICD-10-CM | POA: Diagnosis not present

## 2019-01-19 DIAGNOSIS — R635 Abnormal weight gain: Secondary | ICD-10-CM | POA: Diagnosis not present

## 2019-01-19 DIAGNOSIS — N951 Menopausal and female climacteric states: Secondary | ICD-10-CM | POA: Diagnosis not present

## 2019-01-23 DIAGNOSIS — Z1331 Encounter for screening for depression: Secondary | ICD-10-CM | POA: Diagnosis not present

## 2019-01-23 DIAGNOSIS — K219 Gastro-esophageal reflux disease without esophagitis: Secondary | ICD-10-CM | POA: Diagnosis not present

## 2019-01-23 DIAGNOSIS — E559 Vitamin D deficiency, unspecified: Secondary | ICD-10-CM | POA: Diagnosis not present

## 2019-01-23 DIAGNOSIS — Z6833 Body mass index (BMI) 33.0-33.9, adult: Secondary | ICD-10-CM | POA: Diagnosis not present

## 2019-01-23 DIAGNOSIS — E782 Mixed hyperlipidemia: Secondary | ICD-10-CM | POA: Diagnosis not present

## 2019-01-23 DIAGNOSIS — Z1339 Encounter for screening examination for other mental health and behavioral disorders: Secondary | ICD-10-CM | POA: Diagnosis not present

## 2019-01-30 DIAGNOSIS — E8881 Metabolic syndrome: Secondary | ICD-10-CM | POA: Diagnosis not present

## 2019-01-30 DIAGNOSIS — Z6833 Body mass index (BMI) 33.0-33.9, adult: Secondary | ICD-10-CM | POA: Diagnosis not present

## 2019-02-06 DIAGNOSIS — K5190 Ulcerative colitis, unspecified, without complications: Secondary | ICD-10-CM | POA: Diagnosis not present

## 2019-02-06 DIAGNOSIS — Z6832 Body mass index (BMI) 32.0-32.9, adult: Secondary | ICD-10-CM | POA: Diagnosis not present

## 2019-02-06 DIAGNOSIS — E782 Mixed hyperlipidemia: Secondary | ICD-10-CM | POA: Diagnosis not present

## 2019-02-09 DIAGNOSIS — D2270 Melanocytic nevi of unspecified lower limb, including hip: Secondary | ICD-10-CM | POA: Diagnosis not present

## 2019-02-09 DIAGNOSIS — Z85828 Personal history of other malignant neoplasm of skin: Secondary | ICD-10-CM | POA: Diagnosis not present

## 2019-02-09 DIAGNOSIS — D235 Other benign neoplasm of skin of trunk: Secondary | ICD-10-CM | POA: Diagnosis not present

## 2019-02-09 DIAGNOSIS — D239 Other benign neoplasm of skin, unspecified: Secondary | ICD-10-CM | POA: Diagnosis not present

## 2019-02-09 DIAGNOSIS — L910 Hypertrophic scar: Secondary | ICD-10-CM | POA: Diagnosis not present

## 2019-02-09 DIAGNOSIS — D224 Melanocytic nevi of scalp and neck: Secondary | ICD-10-CM | POA: Diagnosis not present

## 2019-02-09 DIAGNOSIS — Z86018 Personal history of other benign neoplasm: Secondary | ICD-10-CM | POA: Diagnosis not present

## 2019-02-09 DIAGNOSIS — D229 Melanocytic nevi, unspecified: Secondary | ICD-10-CM | POA: Diagnosis not present

## 2019-02-09 DIAGNOSIS — D225 Melanocytic nevi of trunk: Secondary | ICD-10-CM | POA: Diagnosis not present

## 2019-02-09 DIAGNOSIS — Z85820 Personal history of malignant melanoma of skin: Secondary | ICD-10-CM | POA: Diagnosis not present

## 2019-02-09 DIAGNOSIS — L814 Other melanin hyperpigmentation: Secondary | ICD-10-CM | POA: Diagnosis not present

## 2019-02-12 ENCOUNTER — Other Ambulatory Visit: Payer: Self-pay | Admitting: Physician Assistant

## 2019-02-12 DIAGNOSIS — M79605 Pain in left leg: Secondary | ICD-10-CM

## 2019-02-12 DIAGNOSIS — M79662 Pain in left lower leg: Secondary | ICD-10-CM | POA: Diagnosis not present

## 2019-02-12 MED FILL — predniSONE 20 MG TABS: 20 | 12 days supply | Qty: 24 | Fill #0

## 2019-02-13 ENCOUNTER — Other Ambulatory Visit: Payer: Self-pay

## 2019-02-13 ENCOUNTER — Ambulatory Visit (HOSPITAL_COMMUNITY)
Admission: RE | Admit: 2019-02-13 | Discharge: 2019-02-13 | Disposition: A | Discharge status: Home / Self Care | Payer: BLUE CROSS/BLUE SHIELD | Source: Ambulatory Visit | Attending: Physician Assistant | Admitting: Physician Assistant

## 2019-02-13 DIAGNOSIS — M79605 Pain in left leg: Secondary | ICD-10-CM | POA: Diagnosis not present

## 2019-02-13 DIAGNOSIS — Z6832 Body mass index (BMI) 32.0-32.9, adult: Secondary | ICD-10-CM | POA: Diagnosis not present

## 2019-02-13 DIAGNOSIS — E8881 Metabolic syndrome: Secondary | ICD-10-CM | POA: Diagnosis not present

## 2019-02-13 NOTE — Progress Notes (Signed)
Left lower extremity venous duplex completed. Preliminary results in Chart review CV Proc. [facility], [place] 02/13/2019 9:42 AM

## 2019-02-20 DIAGNOSIS — Z6832 Body mass index (BMI) 32.0-32.9, adult: Secondary | ICD-10-CM | POA: Diagnosis not present

## 2019-02-20 DIAGNOSIS — E559 Vitamin D deficiency, unspecified: Secondary | ICD-10-CM | POA: Diagnosis not present

## 2019-02-20 DIAGNOSIS — K219 Gastro-esophageal reflux disease without esophagitis: Secondary | ICD-10-CM | POA: Diagnosis not present

## 2019-02-26 DIAGNOSIS — Z6831 Body mass index (BMI) 31.0-31.9, adult: Secondary | ICD-10-CM | POA: Diagnosis not present

## 2019-02-26 DIAGNOSIS — E782 Mixed hyperlipidemia: Secondary | ICD-10-CM | POA: Diagnosis not present

## 2019-03-06 DIAGNOSIS — E559 Vitamin D deficiency, unspecified: Secondary | ICD-10-CM | POA: Diagnosis not present

## 2019-03-06 DIAGNOSIS — E8881 Metabolic syndrome: Secondary | ICD-10-CM | POA: Diagnosis not present

## 2019-03-06 DIAGNOSIS — E782 Mixed hyperlipidemia: Secondary | ICD-10-CM | POA: Diagnosis not present

## 2019-03-06 DIAGNOSIS — Z6831 Body mass index (BMI) 31.0-31.9, adult: Secondary | ICD-10-CM | POA: Diagnosis not present

## 2019-03-13 DIAGNOSIS — Z6831 Body mass index (BMI) 31.0-31.9, adult: Secondary | ICD-10-CM | POA: Diagnosis not present

## 2019-03-13 DIAGNOSIS — E559 Vitamin D deficiency, unspecified: Secondary | ICD-10-CM | POA: Diagnosis not present

## 2019-03-19 DIAGNOSIS — K5190 Ulcerative colitis, unspecified, without complications: Secondary | ICD-10-CM | POA: Diagnosis not present

## 2019-03-19 DIAGNOSIS — Z79899 Other long term (current) drug therapy: Secondary | ICD-10-CM | POA: Diagnosis not present

## 2019-03-19 DIAGNOSIS — M2550 Pain in unspecified joint: Secondary | ICD-10-CM | POA: Diagnosis not present

## 2019-03-19 DIAGNOSIS — M1990 Unspecified osteoarthritis, unspecified site: Secondary | ICD-10-CM | POA: Diagnosis not present

## 2019-03-20 DIAGNOSIS — K5190 Ulcerative colitis, unspecified, without complications: Secondary | ICD-10-CM | POA: Diagnosis not present

## 2019-03-21 DIAGNOSIS — Z6831 Body mass index (BMI) 31.0-31.9, adult: Secondary | ICD-10-CM | POA: Diagnosis not present

## 2019-03-21 DIAGNOSIS — E782 Mixed hyperlipidemia: Secondary | ICD-10-CM | POA: Diagnosis not present

## 2019-03-21 DIAGNOSIS — E8881 Metabolic syndrome: Secondary | ICD-10-CM | POA: Diagnosis not present

## 2019-03-28 DIAGNOSIS — E8881 Metabolic syndrome: Secondary | ICD-10-CM | POA: Diagnosis not present

## 2019-03-28 DIAGNOSIS — Z6831 Body mass index (BMI) 31.0-31.9, adult: Secondary | ICD-10-CM | POA: Diagnosis not present

## 2019-03-30 DIAGNOSIS — K5190 Ulcerative colitis, unspecified, without complications: Secondary | ICD-10-CM | POA: Diagnosis not present

## 2019-03-30 DIAGNOSIS — M25461 Effusion, right knee: Secondary | ICD-10-CM | POA: Diagnosis not present

## 2019-03-30 DIAGNOSIS — M2550 Pain in unspecified joint: Secondary | ICD-10-CM | POA: Diagnosis not present

## 2019-03-30 DIAGNOSIS — M25562 Pain in left knee: Secondary | ICD-10-CM | POA: Diagnosis not present

## 2019-03-30 DIAGNOSIS — M25561 Pain in right knee: Secondary | ICD-10-CM | POA: Diagnosis not present

## 2019-04-04 DIAGNOSIS — Z6831 Body mass index (BMI) 31.0-31.9, adult: Secondary | ICD-10-CM | POA: Diagnosis not present

## 2019-04-04 DIAGNOSIS — E559 Vitamin D deficiency, unspecified: Secondary | ICD-10-CM | POA: Diagnosis not present

## 2019-04-04 DIAGNOSIS — E782 Mixed hyperlipidemia: Secondary | ICD-10-CM | POA: Diagnosis not present

## 2019-04-11 DIAGNOSIS — E782 Mixed hyperlipidemia: Secondary | ICD-10-CM | POA: Diagnosis not present

## 2019-04-11 DIAGNOSIS — Z6830 Body mass index (BMI) 30.0-30.9, adult: Secondary | ICD-10-CM | POA: Diagnosis not present

## 2019-04-18 DIAGNOSIS — Z6830 Body mass index (BMI) 30.0-30.9, adult: Secondary | ICD-10-CM | POA: Diagnosis not present

## 2019-04-18 DIAGNOSIS — E559 Vitamin D deficiency, unspecified: Secondary | ICD-10-CM | POA: Diagnosis not present

## 2019-04-18 DIAGNOSIS — E8881 Metabolic syndrome: Secondary | ICD-10-CM | POA: Diagnosis not present

## 2019-04-25 DIAGNOSIS — E782 Mixed hyperlipidemia: Secondary | ICD-10-CM | POA: Diagnosis not present

## 2019-04-25 DIAGNOSIS — Z6830 Body mass index (BMI) 30.0-30.9, adult: Secondary | ICD-10-CM | POA: Diagnosis not present

## 2019-04-30 DIAGNOSIS — D234 Other benign neoplasm of skin of scalp and neck: Secondary | ICD-10-CM | POA: Diagnosis not present

## 2019-04-30 DIAGNOSIS — D235 Other benign neoplasm of skin of trunk: Secondary | ICD-10-CM | POA: Diagnosis not present

## 2019-05-02 DIAGNOSIS — K5190 Ulcerative colitis, unspecified, without complications: Secondary | ICD-10-CM | POA: Diagnosis not present

## 2019-05-03 DIAGNOSIS — E8881 Metabolic syndrome: Secondary | ICD-10-CM | POA: Diagnosis not present

## 2019-05-03 DIAGNOSIS — Z6830 Body mass index (BMI) 30.0-30.9, adult: Secondary | ICD-10-CM | POA: Diagnosis not present

## 2019-05-10 DIAGNOSIS — E559 Vitamin D deficiency, unspecified: Secondary | ICD-10-CM | POA: Diagnosis not present

## 2019-05-10 DIAGNOSIS — Z6830 Body mass index (BMI) 30.0-30.9, adult: Secondary | ICD-10-CM | POA: Diagnosis not present

## 2019-05-10 DIAGNOSIS — E782 Mixed hyperlipidemia: Secondary | ICD-10-CM | POA: Diagnosis not present

## 2019-05-17 DIAGNOSIS — Z6830 Body mass index (BMI) 30.0-30.9, adult: Secondary | ICD-10-CM | POA: Diagnosis not present

## 2019-05-17 DIAGNOSIS — K219 Gastro-esophageal reflux disease without esophagitis: Secondary | ICD-10-CM | POA: Diagnosis not present

## 2019-05-17 DIAGNOSIS — E8881 Metabolic syndrome: Secondary | ICD-10-CM | POA: Diagnosis not present

## 2019-06-07 DIAGNOSIS — E782 Mixed hyperlipidemia: Secondary | ICD-10-CM | POA: Diagnosis not present

## 2019-06-07 DIAGNOSIS — Z6831 Body mass index (BMI) 31.0-31.9, adult: Secondary | ICD-10-CM | POA: Diagnosis not present

## 2019-06-08 DIAGNOSIS — D0359 Melanoma in situ of other part of trunk: Secondary | ICD-10-CM | POA: Diagnosis not present

## 2019-06-13 DIAGNOSIS — M2550 Pain in unspecified joint: Secondary | ICD-10-CM | POA: Diagnosis not present

## 2019-06-13 DIAGNOSIS — Z0000 Encounter for general adult medical examination without abnormal findings: Secondary | ICD-10-CM | POA: Diagnosis not present

## 2019-06-13 DIAGNOSIS — K5190 Ulcerative colitis, unspecified, without complications: Secondary | ICD-10-CM | POA: Diagnosis not present

## 2019-06-13 DIAGNOSIS — M1990 Unspecified osteoarthritis, unspecified site: Secondary | ICD-10-CM | POA: Diagnosis not present

## 2019-06-21 DIAGNOSIS — M1990 Unspecified osteoarthritis, unspecified site: Secondary | ICD-10-CM | POA: Diagnosis not present

## 2019-06-21 DIAGNOSIS — Z0000 Encounter for general adult medical examination without abnormal findings: Secondary | ICD-10-CM | POA: Diagnosis not present

## 2019-06-21 DIAGNOSIS — K5190 Ulcerative colitis, unspecified, without complications: Secondary | ICD-10-CM | POA: Diagnosis not present

## 2019-06-21 DIAGNOSIS — Z780 Asymptomatic menopausal state: Secondary | ICD-10-CM | POA: Diagnosis not present

## 2019-06-26 DIAGNOSIS — E8881 Metabolic syndrome: Secondary | ICD-10-CM | POA: Diagnosis not present

## 2019-06-26 DIAGNOSIS — Z6831 Body mass index (BMI) 31.0-31.9, adult: Secondary | ICD-10-CM | POA: Diagnosis not present

## 2019-07-03 DIAGNOSIS — Z6830 Body mass index (BMI) 30.0-30.9, adult: Secondary | ICD-10-CM | POA: Diagnosis not present

## 2019-07-03 DIAGNOSIS — E782 Mixed hyperlipidemia: Secondary | ICD-10-CM | POA: Diagnosis not present

## 2019-07-06 DIAGNOSIS — M25561 Pain in right knee: Secondary | ICD-10-CM | POA: Diagnosis not present

## 2019-07-06 DIAGNOSIS — Z79899 Other long term (current) drug therapy: Secondary | ICD-10-CM | POA: Diagnosis not present

## 2019-07-06 DIAGNOSIS — M1990 Unspecified osteoarthritis, unspecified site: Secondary | ICD-10-CM | POA: Diagnosis not present

## 2019-07-06 DIAGNOSIS — M2550 Pain in unspecified joint: Secondary | ICD-10-CM | POA: Diagnosis not present

## 2019-07-06 DIAGNOSIS — K5190 Ulcerative colitis, unspecified, without complications: Secondary | ICD-10-CM | POA: Diagnosis not present

## 2019-07-11 DIAGNOSIS — Z6830 Body mass index (BMI) 30.0-30.9, adult: Secondary | ICD-10-CM | POA: Diagnosis not present

## 2019-07-11 DIAGNOSIS — E559 Vitamin D deficiency, unspecified: Secondary | ICD-10-CM | POA: Diagnosis not present

## 2019-07-26 DIAGNOSIS — K5190 Ulcerative colitis, unspecified, without complications: Secondary | ICD-10-CM | POA: Diagnosis not present

## 2019-07-26 DIAGNOSIS — Z79899 Other long term (current) drug therapy: Secondary | ICD-10-CM | POA: Diagnosis not present

## 2019-07-27 DIAGNOSIS — Z1231 Encounter for screening mammogram for malignant neoplasm of breast: Secondary | ICD-10-CM | POA: Diagnosis not present

## 2019-07-27 DIAGNOSIS — Z01419 Encounter for gynecological examination (general) (routine) without abnormal findings: Secondary | ICD-10-CM | POA: Diagnosis not present

## 2019-07-27 DIAGNOSIS — Z6831 Body mass index (BMI) 31.0-31.9, adult: Secondary | ICD-10-CM | POA: Diagnosis not present

## 2019-07-27 DIAGNOSIS — Z1151 Encounter for screening for human papillomavirus (HPV): Secondary | ICD-10-CM | POA: Diagnosis not present

## 2019-07-27 LAB — HM PAP SMEAR

## 2019-07-27 LAB — HM MAMMOGRAPHY: HM Mammogram: NORMAL (ref 0–4)

## 2019-07-31 DIAGNOSIS — S81801A Unspecified open wound, right lower leg, initial encounter: Secondary | ICD-10-CM | POA: Diagnosis not present

## 2019-08-23 DIAGNOSIS — Z6831 Body mass index (BMI) 31.0-31.9, adult: Secondary | ICD-10-CM | POA: Diagnosis not present

## 2019-08-23 DIAGNOSIS — E669 Obesity, unspecified: Secondary | ICD-10-CM | POA: Diagnosis not present

## 2019-08-24 DIAGNOSIS — Z6831 Body mass index (BMI) 31.0-31.9, adult: Secondary | ICD-10-CM | POA: Diagnosis not present

## 2019-08-24 DIAGNOSIS — E669 Obesity, unspecified: Secondary | ICD-10-CM | POA: Diagnosis not present

## 2019-09-06 DIAGNOSIS — Z23 Encounter for immunization: Secondary | ICD-10-CM | POA: Diagnosis not present

## 2019-09-06 DIAGNOSIS — K5190 Ulcerative colitis, unspecified, without complications: Secondary | ICD-10-CM | POA: Diagnosis not present

## 2019-09-23 ENCOUNTER — Other Ambulatory Visit: Payer: Self-pay | Admitting: Gastroenterology

## 2019-10-09 DIAGNOSIS — M1990 Unspecified osteoarthritis, unspecified site: Secondary | ICD-10-CM | POA: Diagnosis not present

## 2019-10-09 DIAGNOSIS — M25561 Pain in right knee: Secondary | ICD-10-CM | POA: Diagnosis not present

## 2019-10-09 DIAGNOSIS — M1711 Unilateral primary osteoarthritis, right knee: Secondary | ICD-10-CM | POA: Diagnosis not present

## 2019-10-09 DIAGNOSIS — K5190 Ulcerative colitis, unspecified, without complications: Secondary | ICD-10-CM | POA: Diagnosis not present

## 2019-10-09 DIAGNOSIS — Z79899 Other long term (current) drug therapy: Secondary | ICD-10-CM | POA: Diagnosis not present

## 2019-10-09 DIAGNOSIS — M2550 Pain in unspecified joint: Secondary | ICD-10-CM | POA: Diagnosis not present

## 2019-10-18 DIAGNOSIS — K5190 Ulcerative colitis, unspecified, without complications: Secondary | ICD-10-CM | POA: Diagnosis not present

## 2019-11-01 ENCOUNTER — Other Ambulatory Visit: Payer: Self-pay | Admitting: Gastroenterology

## 2019-11-28 ENCOUNTER — Encounter: Payer: Self-pay | Admitting: Gastroenterology

## 2019-12-10 ENCOUNTER — Other Ambulatory Visit: Payer: Self-pay | Admitting: Gastroenterology

## 2019-12-10 ENCOUNTER — Telehealth: Payer: Self-pay | Admitting: Gastroenterology

## 2019-12-10 MED ORDER — ESOMEPRAZOLE MAGNESIUM 40 MG PO CPDR: 40.0000 mg | DELAYED_RELEASE_CAPSULE | Freq: Every day | ORAL | 0 refills | Status: DC

## 2019-12-10 MED ORDER — FAMOTIDINE 40 MG PO TABS: 40.0000 mg | ORAL_TABLET | Freq: Every day | ORAL | 0 refills | Status: DC

## 2019-12-10 NOTE — Telephone Encounter (Signed)
Prescriptions sent to patient's pharmacy until scheduled appt.

## 2019-12-10 NOTE — Telephone Encounter (Signed)
Pt is scheduled to see Dr. Fuller Plan on 2/9. She is requesting rf of Nexium and Pepcid sent to [facility] pharmacy on [facility].

## 2020-01-08 ENCOUNTER — Ambulatory Visit: Payer: BLUE CROSS/BLUE SHIELD | Admitting: Gastroenterology

## 2020-01-18 ENCOUNTER — Telehealth: Payer: Self-pay

## 2020-01-18 NOTE — Telephone Encounter (Signed)
Patient called in stating that she would like to re-establish with Dr. Birdie Riddle. Please advise

## 2020-01-28 ENCOUNTER — Other Ambulatory Visit: Payer: Self-pay | Admitting: Gastroenterology

## 2020-01-31 NOTE — Telephone Encounter (Signed)
Pt is scheduled °

## 2020-01-31 NOTE — Telephone Encounter (Signed)
FYI

## 2020-01-31 NOTE — Telephone Encounter (Signed)
Ok to re-establish

## 2020-02-13 ENCOUNTER — Telehealth (INDEPENDENT_AMBULATORY_CARE_PROVIDER_SITE_OTHER): Payer: BC Managed Care – PPO | Admitting: Family Medicine

## 2020-02-13 ENCOUNTER — Other Ambulatory Visit: Payer: Self-pay

## 2020-02-13 ENCOUNTER — Encounter: Payer: Self-pay | Admitting: Family Medicine

## 2020-02-13 DIAGNOSIS — I10 Essential (primary) hypertension: Secondary | ICD-10-CM | POA: Diagnosis not present

## 2020-02-13 DIAGNOSIS — R296 Repeated falls: Secondary | ICD-10-CM | POA: Diagnosis not present

## 2020-02-13 DIAGNOSIS — K5100 Ulcerative (chronic) pancolitis without complications: Secondary | ICD-10-CM

## 2020-02-13 MED ORDER — HYDROCHLOROTHIAZIDE 12.5 MG PO TABS: 12.5000 mg | ORAL_TABLET | Freq: Every day | ORAL | 3 refills | Status: DC

## 2020-02-13 NOTE — Progress Notes (Signed)
I have discussed the procedure for the virtual visit with the patient who has given consent to proceed with assessment and treatment.   Pt says her BP has been elevated lately. Cannot check vitals at home.   Davis Gourd, CMA

## 2020-02-13 NOTE — Progress Notes (Signed)
Virtual Visit via Video   I connected with patient on 02/13/20 at  1:30 PM EDT by a video enabled telemedicine application and verified that I am speaking with the correct person using two identifiers.  Location patient: Home Location provider: Acup[facility]urist, Office Persons participating in the virtual visit: Patient, Provider, [place] (Jess B)  I discussed the limitations of evaluation and management by telemedicine and the availability of in person appointments. The patient expressed understanding and agreed to proceed.  Subjective:   HPI:   Pt to re-establish today.  UC- chronic problem, following w/ Dr Fuller Plan.  On Infliximab infusion q6 weeks.  Pt reports sxs are well controlled at this time.  Elevated BP- pt reports BP is in upper 130s/80-90s.  She has been monitor this 'and it's been creeping up'.  Pt has noticed elevated BP x4-5 months.  No CP, SOB, HAs, visual changes, edema.  Previously on HCTZ daily.      Frequent falls- pt reports falling on vacation twice while in heels, also slipped on wet tile on vacation.  Pt has fallen a few times while in flip flops.  Denies dizziness, changes in vision.  Doesn't feel that legs are heavy but admits that she's likely shuffling more.  Has had 1st COVID vaccine  ROS:   See pertinent positives and negatives per HPI.  Patient Active Problem List   Diagnosis Date Noted  . Exercise intolerance 03/26/2017  . Systolic murmur 19/37/9024  . Anxiety state 05/28/2016  . Colitis 05/26/2016  . Persistent cough 03/17/2016  . Chronic rhinitis 03/17/2016  . Acute pharyngitis 03/06/2016  . Post viral RAD (reactive airway disease) 02/06/2016  . Urinary frequency 12/18/2015  . Menopausal syndrome (hot flushes) 08/23/2014  . Screening for malignant neoplasm of the cervix 02/25/2014  . Bronchospasm with bronchitis 05/24/2013  . URI (upper respiratory infection) 05/22/2013  . General medical examination 12/07/2011  . GERD (gastroesophageal  reflux disease) 10/08/2011  . Anxiety and depression 10/08/2011  . ABSCESS, FINGER 08/25/2011  . SACROILIAC JOINT DYSFUNCTION 04/12/2011  . TROCHANTERIC BURSITIS, BILATERAL 04/12/2011  . NEVUS, ATYPICAL 07/14/2010  . VAGINITIS 07/14/2010  . Cervical radiculopathy 07/14/2010  . GERD 12/31/2009  . DIZZINESS 11/10/2009  . CARCINOMA, BASAL CELL 03/14/2008  . VITAMIN D DEFICIENCY 03/06/2008  . HYPERTRIGLYCERIDEMIA 03/06/2008  . FATIGUE 03/04/2008  . Ulcerative colitis ([place]) 02/09/2007  . INSOMNIA 02/09/2007    Social History   Tobacco Use  . Smoking status: Former Smoker    Packs/day: 0.50    Years: 25.00    Pack years: 12.50    Types: Cigarettes    Quit date: 02/27/2006    Years since quitting: 13.9  . Smokeless tobacco: Never Used  Substance Use Topics  . Alcohol use: Yes    Alcohol/week: 3.0 standard drinks    Types: 3 Glasses of wine per week    Comment: every other weekend socially    Current Outpatient Medications:  .  acetaminophen (TYLENOL) 500 MG tablet, Take 1,000 mg by mouth every 6 (six) hours as needed for headache., Disp: , Rfl:  .  albuterol (PROVENTIL HFA;VENTOLIN HFA) 108 (90 Base) MCG/ACT inhaler, Inhale 2 puffs into the lungs every 6 (six) hours as needed for wheezing or shortness of breath., Disp: 1 Inhaler, Rfl: 2 .  buPROPion (WELLBUTRIN XL) 150 MG 24 hr tablet, Take 300 mg by mouth daily. , Disp: , Rfl: 3 .  CALCIUM PO, Take 1,500 mg by mouth daily., Disp: , Rfl:  .  celecoxib (  CELEBREX) 200 MG capsule, Take 200 mg by mouth 2 (two) times daily as needed. , Disp: , Rfl: 1 .  esomeprazole (NEXIUM) 40 MG capsule, Take 1 capsule (40 mg total) by mouth daily., Disp: 30 capsule, Rfl: 0 .  famotidine (PEPCID) 40 MG tablet, Take 1 tablet (40 mg total) by mouth daily., Disp: 30 tablet, Rfl: 0 .  inFLIXimab 500 mg in sodium chloride 0.9 % 200 mL, Inject 500 mg into the vein every 6 (six) weeks. 448 mg, Disp: , Rfl:  .  OVER THE COUNTER MEDICATION, Take 1 tablet by  mouth daily. Juice plus, Disp: , Rfl:  .  Probiotic Product (VSL#3) CAPS, Take 1 capsule by mouth 2 (two) times daily., Disp: 60 capsule, Rfl: 5 .  diphenoxylate-atropine (LOMOTIL) 2.5-0.025 MG tablet, Take 1 tablet by mouth 2 (two) times daily. (Patient not taking: Reported on 02/13/2020), Disp: 180 tablet, Rfl: 3 .  GLUCOSAMINE-MSM-HYALURONIC ACD PO, Take 1 tablet by mouth daily., Disp: , Rfl:  .  glycopyrrolate (ROBINUL) 2 MG tablet, Take 1 tablet (2 mg total) by mouth 2 (two) times daily. (Patient not taking: Reported on 02/13/2020), Disp: 60 tablet, Rfl: 5  Allergies  Allergen Reactions  . Tagamet [Cimetidine] Anaphylaxis    REACTION: anaphalactic shock  . Effexor [Venlafaxine]     Swelling   . Mobic [Meloxicam] Swelling  . Ultram [Tramadol] Swelling    Objective:   LMP 09/02/2013  AAOx3, NAD NCAT, EOMI No obvious CN deficits Coloring WNL Pt is able to speak clearly, coherently without shortness of breath or increased work of breathing.  Thought process is linear.  Mood is appropriate.   Assessment and Plan:   HTN- pt has hx of elevated BP in the past and was on HCTZ daily.  She was able to come off the medication and do well for a number of years but recently BP has again been elevated.  Will restart HCTZ and follow up in 4-6 weeks to recheck BP and BMP.  Pt expressed understanding and is in agreement w/ plan.   Frequent falls- new.  Pt reports most falls seem to occur when wearing 'the wrong shoes'.  Recent falls occurred in heels or flip flops.  Encouraged her to be more aware of picking up her feet and tracking her falls to determine if footwear was indeed the problem.  Offered PT evaluation but she declined at this time.  UC- chronic problem.  Following w/ Dr Fuller Plan.  Is getting Remicade infusions.  Will look into the possibility of doing this at the [facility].    Annye Asa, MD 02/13/2020

## 2020-02-21 ENCOUNTER — Other Ambulatory Visit: Payer: Self-pay | Admitting: Gastroenterology

## 2020-03-04 ENCOUNTER — Emergency Department (HOSPITAL_COMMUNITY)
Admission: EM | Admit: 2020-03-04 | Discharge: 2020-03-05 | Disposition: A | Discharge status: Home / Self Care | Payer: BC Managed Care – PPO | Attending: Emergency Medicine | Admitting: Emergency Medicine

## 2020-03-04 ENCOUNTER — Other Ambulatory Visit: Payer: Self-pay

## 2020-03-04 DIAGNOSIS — E876 Hypokalemia: Secondary | ICD-10-CM

## 2020-03-04 DIAGNOSIS — Z87891 Personal history of nicotine dependence: Secondary | ICD-10-CM | POA: Insufficient documentation

## 2020-03-04 DIAGNOSIS — T502X5A Adverse effect of carbonic-anhydrase inhibitors, benzothiadiazides and other diuretics, initial encounter: Secondary | ICD-10-CM | POA: Insufficient documentation

## 2020-03-04 DIAGNOSIS — Z79899 Other long term (current) drug therapy: Secondary | ICD-10-CM | POA: Diagnosis not present

## 2020-03-04 DIAGNOSIS — R55 Syncope and collapse: Secondary | ICD-10-CM | POA: Diagnosis present

## 2020-03-04 DIAGNOSIS — I10 Essential (primary) hypertension: Secondary | ICD-10-CM | POA: Insufficient documentation

## 2020-03-04 MED ORDER — LACTATED RINGERS IV BOLUS
1000.0000 mL | Freq: Once | INTRAVENOUS | Status: AC
  Administered 2020-03-04: 1000 mL via INTRAVENOUS

## 2020-03-04 NOTE — ED Triage Notes (Signed)
Pt arrives via EMS with complaint of near syncope. Pt reports she was at dinner sitting down and began to feel dizzy. She laid on the ground and states that she felt like she was going to pass out but reports no LOC. EMS reports the pt BP dropped to 70/30 while attempting to get orthostatic vitals.

## 2020-03-04 NOTE — ED Provider Notes (Signed)
[facility] EMERGENCY DEPARTMENT Provider Note   CSN: [id number] Arrival date & time: 03/04/20  2218   History Chief Complaint  Patient presents with  . Near Syncope    Melissa Cohen is a 54 y.o. female.  The history is provided by the patient.  Near Syncope  She has history of hypertension, hyperlipidemia, ulcerative colitis and comes in because of a near syncopal episode.  She was eating dinner when she started to feel lightheaded.  Friend was with her noted that she got very pale and she got very sweaty.  She denies nausea but she felt better laying down.  EMS was called and noted that blood pressure dropped to 70/30 when she stood up and she got dizzy again when standing up.  She feels like she is back to baseline.  Of note, she was recently restarted on hydrochlorothiazide because of hypertension.  There have been no other medication changes.  She denies chest pain, heaviness, tightness, pressure.  She denies palpitations.  There is nothing upsetting occurring at the time that she got dizzy.  She does admit to having had one mixed drink.  Also, a friend who is with her states that she has been having falls.  She was recently in [place] and slipped on a wet surface on one occasion and felt voices because she was wearing high heels.  But, she apparently has been having some difficulty stumbling over uneven ground over the past several months.  Past Medical History:  Diagnosis Date  . Allergy   . Anxiety   . Arthritis    Osteo arthritis - bil knees  . Asthma    as a young adult related to allergy flare  . Atypical nevi 08/25/2018   1. LEFT MID BACK (MILD) - NO TX, 2. RIGHT CHEST (SEVERE) - W/S  . Atypical nevi 09/14/2018   MID BACK (SEVERE) - W/S  . Atypical nevi 11/13/2018   LEFT POST. NECK INCIDENTRAL MOLE (MILD)  . Atypical nevi 11/27/2018   RIGHT CHEST + MARGIN  . Atypical nevus 01/02/2018   LEFT UPPER BACK (SEVERE) - W/S  . Atypical nevus 01/04/2019   LEFT STERNUM ( MODERATE)  . Atypical nevus 01/04/2019   LEFT POST NECK INF. (SEVERE)  . Atypical nevus 01/04/2019   RECURRENT MID BACK - [facility] DR. PAVLIS  . Basal cell carcinoma 06/06/2008   LEFT UPPER SHOULDER PROX. @ [facility]  . Blood transfusion without reported diagnosis    from [facility] several  . Depression   . Gastric ulcer   . GERD (gastroesophageal reflux disease)   . Heart murmur   . Hyperlipidemia   . Iron deficiency anemia   . Melanoma ([place])    right cleavage, posterior left shoulder  . MM (malignant melanoma of skin) ([place]) 09/14/2018   LEFT POST. NECK MIS - EXC  . MM (malignant melanoma of skin) ([place]) 09/14/2018   RIGHT CHEST MIS - EXC  . Restless leg syndrome   . Seizures ([place])    2-3 d/t hypogylcermia - last seizure was 1998  . Sleep apnea    sleep study showed mild sleep apnea - no c-papa needed  . Ulcerative colitis    Dx at age 29; had been 57 years since her last 6 and then had a flareup recently in summer of 2017. Was then started back on Remicade and is doing well since.    Patient Active Problem List   Diagnosis Date Noted  .  Frequent falls 02/13/2020  . HTN (hypertension) 02/13/2020  . Systolic murmur 70/96/2836  . Chronic rhinitis 03/17/2016  . Menopausal syndrome (hot flushes) 08/23/2014  . Screening for malignant neoplasm of the cervix 02/25/2014  . General medical examination 12/07/2011  . Anxiety and depression 10/08/2011  . SACROILIAC JOINT DYSFUNCTION 04/12/2011  . TROCHANTERIC BURSITIS, BILATERAL 04/12/2011  . NEVUS, ATYPICAL 07/14/2010  . Cervical radiculopathy 07/14/2010  . GERD 12/31/2009  . CARCINOMA, BASAL CELL 03/14/2008  . VITAMIN D DEFICIENCY 03/06/2008  . HYPERTRIGLYCERIDEMIA 03/06/2008  . Ulcerative colitis ([place]) 02/09/2007  . INSOMNIA 02/09/2007    Past Surgical History:  Procedure Laterality Date  . APPENDECTOMY    . BREAST ENHANCEMENT SURGERY  2004  . CHOLECYSTECTOMY  2011  . COLONOSCOPY    .  right eye lasix     02/17/12  . TONSILLECTOMY AND ADENOIDECTOMY    . TUBAL LIGATION    . UPPER GASTROINTESTINAL ENDOSCOPY    . Uterine ablation    . WISDOM TOOTH EXTRACTION       OB History   No obstetric history on file.     Family History  Problem Relation Age of Onset  . Prostate cancer Father   . Hyperlipidemia Father   . Barrett's esophagus Father   . Cancer Father        prostate  . Colon cancer Father 61       appendical adenocarcinoma 2011  . Diabetes Sister   . Diabetes Other        Grandparents  . Heart attack Maternal Grandfather   . Heart disease Maternal Grandfather   . Colitis Maternal Grandfather   . Ulcerative colitis Mother   . Ulcerative colitis Maternal Grandmother   . Crohn's disease Brother   . Esophageal cancer Neg Hx   . Rectal cancer Neg Hx   . Stomach cancer Neg Hx   . Allergic rhinitis Neg Hx   . Angioedema Neg Hx   . Asthma Neg Hx   . Atopy Neg Hx   . Eczema Neg Hx   . Immunodeficiency Neg Hx   . Colon polyps Neg Hx     Social History   Tobacco Use  . Smoking status: Former Smoker    Packs/day: 0.50    Years: 25.00    Pack years: 12.50    Types: Cigarettes    Quit date: 02/27/2006    Years since quitting: 14.0  . Smokeless tobacco: Never Used  Substance Use Topics  . Alcohol use: Yes    Alcohol/week: 3.0 standard drinks    Types: 3 Glasses of wine per week    Comment: every other weekend socially  . Drug use: No    Home Medications Prior to Admission medications   Medication Sig Start Date End Date Taking? Authorizing Provider  acetaminophen (TYLENOL) 500 MG tablet Take 1,000 mg by mouth every 6 (six) hours as needed for headache.    [provider]  albuterol (PROVENTIL HFA;VENTOLIN HFA) 108 (90 Base) MCG/ACT inhaler Inhale 2 puffs into the lungs every 6 (six) hours as needed for wheezing or shortness of breath. 02/20/16   Midge Minium, MD  buPROPion (WELLBUTRIN XL) 150 MG 24 hr tablet Take 300 mg by mouth  daily.  09/15/18   [provider]  CALCIUM PO Take 1,500 mg by mouth daily.    [provider]  celecoxib (CELEBREX) 200 MG capsule Take 200 mg by mouth 2 (two) times daily as needed.  03/09/17   [provider]  diphenoxylate-atropine (LOMOTIL) 2.5-0.025 MG tablet Take 1 tablet by mouth 2 (two) times daily. Patient not taking: Reported on 02/13/2020 06/08/17   Ladene Artist, MD  esomeprazole (NEXIUM) 40 MG capsule TAKE ONE CAPSULE BY MOUTH ONE TIME DAILY 02/21/20   Ladene Artist, MD  famotidine (PEPCID) 40 MG tablet Take 1 tablet (40 mg total) by mouth daily. 12/10/19   Ladene Artist, MD  [facility] ACD PO Take 1 tablet by mouth daily.    [provider]  glycopyrrolate (ROBINUL) 2 MG tablet Take 1 tablet (2 mg total) by mouth 2 (two) times daily. Patient not taking: Reported on 02/13/2020 05/19/16   Ladene Artist, MD  hydrochlorothiazide (HYDRODIURIL) 12.5 MG tablet Take 1 tablet (12.5 mg total) by mouth daily. 02/13/20   Midge Minium, MD  inFLIXimab 500 mg in sodium chloride 0.9 % 200 mL Inject 500 mg into the vein every 6 (six) weeks. 448 mg 07/18/17   Ladene Artist, MD  OVER THE COUNTER MEDICATION Take 1 tablet by mouth daily. Juice plus    [provider]  Probiotic Product (VSL#3) CAPS Take 1 capsule by mouth 2 (two) times daily. 05/28/16   Levin Erp, PA  escitalopram (LEXAPRO) 10 MG tablet Take 1 tablet (10 mg total) by mouth daily. 12/07/11 02/23/12  Midge Minium, MD    Allergies    Tagamet [cimetidine], Effexor [venlafaxine], Mobic [meloxicam], and Ultram [tramadol]  Review of Systems   Review of Systems  Cardiovascular: Positive for near-syncope.  All other systems reviewed and are negative.   Physical Exam Updated Vital Signs BP 128/80   Pulse 84   Temp 98 F (36.7 C) (Oral)   Resp 17   Ht 5' 4"  (1.626 m)   Wt 88 kg   LMP 09/02/2013   SpO2 99%   BMI 33.30 kg/m   Physical Exam  Vitals and nursing note reviewed.   54 year old female, resting comfortably and in no acute distress. Vital signs are normal. Oxygen saturation is 99%, which is normal. Head is normocephalic and atraumatic. PERRLA, EOMI. Oropharynx is clear. Neck is nontender and supple without adenopathy or JVD. Back is nontender and there is no CVA tenderness. Lungs are clear without rales, wheezes, or rhonchi. Chest is nontender. Heart has regular rate and rhythm without murmur. Abdomen is soft, flat, nontender without masses or hepatosplenomegaly and peristalsis is normoactive. Extremities have no cyanosis or edema, full range of motion is present. Skin is warm and dry without rash. Neurologic: Mental status is normal, cranial nerves are intact, there are no motor or sensory deficits.  ED Results / Procedures / Treatments   Labs (all labs ordered are listed, but only abnormal results are displayed) Labs Reviewed  COMPREHENSIVE METABOLIC PANEL - Abnormal; Notable for the following components:      Result Value   Potassium 3.0 (*)    Total Protein 6.4 (*)    Total Bilirubin <0.1 (*)    All other components within normal limits  CBC WITH DIFFERENTIAL/PLATELET  TROPONIN I (HIGH SENSITIVITY)  TROPONIN I (HIGH SENSITIVITY)    EKG EKG Interpretation  Date/Time:  Tuesday March 04 2020 22:31:22 EDT Ventricular Rate:  81 PR Interval:    QRS Duration: 118 QT Interval:  412 QTC Calculation: 479 R Axis:   -4 Text Interpretation: Sinus rhythm Incomplete right bundle branch block Low voltage, precordial leads Probable left ventricular hypertrophy When compared with ECG of 11/11/2009, No significant change was found  Confirmed by Delora Fuel ([id number]) on 03/04/2020 11:03:34 PM   Procedures Procedures  Medications Ordered in ED Medications  lactated ringers bolus 1,000 mL (0 mLs Intravenous Stopped 03/05/20 0016)  potassium chloride SA (KLOR-CON) CR tablet 40 mEq (40 mEq Oral Given 03/05/20 0331)    ED  Course  I have reviewed the triage vital signs and the nursing notes.  Pertinent labs & imaging results that were available during my care of the patient were reviewed by me and considered in my medical decision making (see chart for details).  Near syncope.  Description sounds like a vasovagal episode, although it would be unusual not to have nausea as part of the episode.  She appears to be back to her baseline.  Based on history, does not sound like she had an arrhythmia.  ECG shows no acute changes.  Will check screening labs and give IV fluids.  Old records are reviewed confirming reinstitution of hydrochlorothiazide 12.5 mg daily on March 17.  Labs shows significant hypokalemia, probably diuretic induced.  She is feeling better after IV fluids.  She was able to ambulate without difficulty.  She is referred to neurology for outpatient evaluation of her balance issues, given prescription for K. Dur.  MDM Rules/Calculators/A&P Patient at low risk based on [place] syncope rule.  Final Clinical Impression(s) / ED Diagnoses Final diagnoses:  Near syncope  Diuretic-induced hypokalemia    Rx / DC Orders ED Discharge Orders         Ordered    potassium chloride SA (KLOR-CON) 20 MEQ tablet     03/05/20 7340           Delora Fuel, MD [birth year] [phone]

## 2020-03-05 LAB — COMPREHENSIVE METABOLIC PANEL
ALT: 36 U/L (ref 0–44)
AST: 30 U/L (ref 15–41)
Albumin: 3.8 g/dL (ref 3.5–5.0)
Alkaline Phosphatase: 72 U/L (ref 38–126)
Anion gap: 13 (ref 5–15)
BUN: 12 mg/dL (ref 6–20)
CO2: 26 mmol/L (ref 22–32)
Calcium: 9.2 mg/dL (ref 8.9–10.3)
Chloride: 102 mmol/L (ref 98–111)
Creatinine, Ser: 0.92 mg/dL (ref 0.44–1.00)
GFR calc Af Amer: >60 mL/min (ref >60)
GFR calc non Af Amer: >60 mL/min (ref >60)
Glucose, Bld: 95 mg/dL (ref 70–99)
Potassium: 3 mmol/L — ABNORMAL LOW (ref 3.5–5.1)
Sodium: 141 mmol/L (ref 135–145)
Total Bilirubin: <0.1 mg/dL — ABNORMAL LOW (ref 0.3–1.2)
Total Protein: 6.4 g/dL — ABNORMAL LOW (ref 6.5–8.1)

## 2020-03-05 LAB — CBC WITH DIFFERENTIAL/PLATELET
Abs Immature Granulocytes: 0.03 10*3/uL (ref 0.00–0.07)
Basophils Absolute: 0.1 10*3/uL (ref 0.0–0.1)
Basophils Relative: 1 %
Eosinophils Absolute: 0.1 10*3/uL (ref 0.0–0.5)
Eosinophils Relative: 2 %
HCT: 42.4 % (ref 36.0–46.0)
Hemoglobin: 14.2 g/dL (ref 12.0–15.0)
Immature Granulocytes: 0 %
Lymphocytes Relative: 32 %
Lymphs Abs: 3 10*3/uL (ref 0.7–4.0)
MCH: 30.1 pg (ref 26.0–34.0)
MCHC: 33.5 g/dL (ref 30.0–36.0)
MCV: 89.8 fL (ref 80.0–100.0)
Monocytes Absolute: 0.9 10*3/uL (ref 0.1–1.0)
Monocytes Relative: 9 %
Neutro Abs: 5.3 10*3/uL (ref 1.7–7.7)
Neutrophils Relative %: 56 %
Platelets: 309 10*3/uL (ref 150–400)
RBC: 4.72 MIL/uL (ref 3.87–5.11)
RDW: 13.2 % (ref 11.5–15.5)
WBC: 9.5 10*3/uL (ref 4.0–10.5)
nRBC: 0 % (ref 0.0–0.2)

## 2020-03-05 LAB — TROPONIN I (HIGH SENSITIVITY)
Troponin I (High Sensitivity): 3 ng/L (ref <18)
Troponin I (High Sensitivity): 3 ng/L (ref <18)

## 2020-03-05 MED ORDER — POTASSIUM CHLORIDE CRYS ER 20 MEQ PO TBCR: EXTENDED_RELEASE_TABLET | ORAL | 0 refills | Status: DC

## 2020-03-05 MED ORDER — POTASSIUM CHLORIDE CRYS ER 20 MEQ PO TBCR
40.0000 meq | EXTENDED_RELEASE_TABLET | Freq: Once | ORAL | Status: AC
  Administered 2020-03-05: 40 meq via ORAL
  Filled 2020-03-05: qty 2

## 2020-03-05 NOTE — ED Notes (Signed)
Discharge instructions reviewed with pt. Pt verbalized understanding.   

## 2020-03-05 NOTE — ED Notes (Signed)
Pt ambulated to restroom with RN assistance. Pt noted to have a steady gait. Pt denies any lightheaded or dizziness.

## 2020-03-05 NOTE — Discharge Instructions (Addendum)
Please follow up with the neurologist to investigate your recent tendency to fall.  Return if you are having any problems.

## 2020-03-14 ENCOUNTER — Encounter: Payer: Self-pay | Admitting: Family Medicine

## 2020-03-14 ENCOUNTER — Other Ambulatory Visit: Payer: Self-pay

## 2020-03-14 ENCOUNTER — Ambulatory Visit (INDEPENDENT_AMBULATORY_CARE_PROVIDER_SITE_OTHER): Payer: BC Managed Care – PPO | Admitting: Family Medicine

## 2020-03-14 VITALS — BP 122/82 | HR 80 | Temp 97.8°F | Resp 16 | Ht 64.0 in | Wt 199.5 lb

## 2020-03-14 DIAGNOSIS — E781 Pure hyperglyceridemia: Secondary | ICD-10-CM

## 2020-03-14 DIAGNOSIS — I10 Essential (primary) hypertension: Secondary | ICD-10-CM

## 2020-03-14 DIAGNOSIS — E669 Obesity, unspecified: Secondary | ICD-10-CM | POA: Insufficient documentation

## 2020-03-14 DIAGNOSIS — Z6832 Body mass index (BMI) 32.0-32.9, adult: Secondary | ICD-10-CM | POA: Insufficient documentation

## 2020-03-14 DIAGNOSIS — R296 Repeated falls: Secondary | ICD-10-CM

## 2020-03-14 DIAGNOSIS — L853 Xerosis cutis: Secondary | ICD-10-CM

## 2020-03-14 NOTE — Assessment & Plan Note (Signed)
Better control today.  Swelling has resolved.  Asymptomatic w/ exception of SOB when bending at the waist.  Was hypokalemic in ER.  Now on daily K+.  Check labs and adjust supplementation as needed

## 2020-03-14 NOTE — Patient Instructions (Signed)
Schedule your complete physical in 6 months We'll notify you of your lab results and make any changes if needed Continue to work on healthy diet and regular exercise- you can do it! Continue to drink plenty of water Continue the potassium supplement daily We'll call you with your Neuro referral Call with any questions or concerns [place]!

## 2020-03-14 NOTE — Progress Notes (Signed)
   Subjective:    Patient ID: Melissa Cohen, female    DOB: 1966-09-27, 54 y.o.   MRN: [id number]  HPI HTN- pt was restarted on HCTZ last visit.  BP today is excellent at 122/82.  No CP, SOB (unless bending at the waist), HAs, visual changes, edema.  Pre-syncope- pt was seen in ER on 03/04/20 for pre-syncope.  Work up was WNL w/ exception of low K+ (3.0).  It was felt to be a vasovagal episode and pt felt better w/ potassium supplementation and IVF.  Pt has increased water intake.  Obesity- BMI is now 34.24  No regular exercise.  Not following a particular diet  Hypertriglyceridemia- pt has remote hx of this.  Not currently on medication.  Frequent falls- pt doesn't want a neuro referral but partner and daughter feel she should be evaluated   Review of Systems For ROS see HPI   This visit occurred during the SARS-CoV-2 public health emergency.  Safety protocols were in place, including screening questions prior to the visit, additional usage of staff PPE, and extensive cleaning of exam room while observing appropriate contact time as indicated for disinfecting solutions.       Objective:   Physical Exam Vitals reviewed.  Constitutional:      General: She is not in acute distress.    Appearance: Normal appearance. She is well-developed. She is obese.  HENT:     Head: Normocephalic and atraumatic.  Eyes:     Conjunctiva/sclera: Conjunctivae normal.     Pupils: Pupils are equal, round, and reactive to light.  Neck:     Thyroid: No thyromegaly.  Cardiovascular:     Rate and Rhythm: Normal rate and regular rhythm.     Heart sounds: Normal heart sounds. No murmur.  Pulmonary:     Effort: Pulmonary effort is normal. No respiratory distress.     Breath sounds: Normal breath sounds.  Abdominal:     General: There is no distension.     Palpations: Abdomen is soft.     Tenderness: There is no abdominal tenderness.  Musculoskeletal:     Cervical back: Normal range of motion and  neck supple.  Lymphadenopathy:     Cervical: No cervical adenopathy.  Skin:    General: Skin is warm and dry.  Neurological:     Mental Status: She is alert and oriented to person, place, and time.  Psychiatric:        Behavior: Behavior normal.           Assessment & Plan:  Dry skin- pt reports sxs are severe.  Check thyroid labs to assess.

## 2020-03-14 NOTE — Assessment & Plan Note (Signed)
Pt has remote hx of this.  Check labs and tx prn.

## 2020-03-14 NOTE — Assessment & Plan Note (Signed)
Pt's BMI is now >34 and w/ her hypertriglyceridemia and HTN this qualifies as morbid obesity.  Will check labs to risk stratify.  Encouraged healthy diet and regular exercise.  Will follow.

## 2020-03-14 NOTE — Assessment & Plan Note (Signed)
Pt initially declined neuro referral but partner and daughter are concerned so she is willing to go for evaluation.  Referral placed.

## 2020-03-17 ENCOUNTER — Encounter: Payer: Self-pay | Admitting: Family Medicine

## 2020-03-19 ENCOUNTER — Other Ambulatory Visit: Payer: Self-pay

## 2020-03-19 LAB — BASIC METABOLIC PANEL
BUN: 15 mg/dL (ref 7–25)
CO2: 23 mmol/L (ref 20–32)
Calcium: 9.4 mg/dL (ref 8.6–10.4)
Chloride: 105 mmol/L (ref 98–110)
Creat: 0.95 mg/dL (ref 0.50–1.05)
Glucose, Bld: 106 mg/dL — ABNORMAL HIGH (ref 65–99)
Potassium: 3.9 mmol/L (ref 3.5–5.3)
Sodium: 142 mmol/L (ref 135–146)

## 2020-03-19 LAB — TEST AUTHORIZATION

## 2020-03-19 LAB — LIPID PANEL
Cholesterol: 243 mg/dL — ABNORMAL HIGH (ref <200)
HDL: 56 mg/dL (ref >=50)
LDL Cholesterol (Calc): 135 mg/dL (calc) — ABNORMAL HIGH
Non-HDL Cholesterol (Calc): 187 mg/dL (calc) — ABNORMAL HIGH (ref <130)
Total CHOL/HDL Ratio: 4.3 (calc) (ref <5.0)
Triglycerides: 361 mg/dL — ABNORMAL HIGH (ref <150)

## 2020-03-19 LAB — CBC WITH DIFFERENTIAL/PLATELET
Absolute Monocytes: 685 cells/uL (ref 200–950)
Basophils Absolute: 39 cells/uL (ref 0–200)
Basophils Relative: 0.5 %
Eosinophils Absolute: 139 cells/uL (ref 15–500)
Eosinophils Relative: 1.8 %
HCT: 41.2 % (ref 35.0–45.0)
Hemoglobin: 14 g/dL (ref 11.7–15.5)
Lymphs Abs: 3018 cells/uL (ref 850–3900)
MCH: 30.4 pg (ref 27.0–33.0)
MCHC: 34 g/dL (ref 32.0–36.0)
MCV: 89.4 fL (ref 80.0–100.0)
MPV: 10.3 fL (ref 7.5–12.5)
Monocytes Relative: 8.9 %
Neutro Abs: 3819 cells/uL (ref 1500–7800)
Neutrophils Relative %: 49.6 %
Platelets: 297 10*3/uL (ref 140–400)
RBC: 4.61 10*6/uL (ref 3.80–5.10)
RDW: 13.4 % (ref 11.0–15.0)
Total Lymphocyte: 39.2 %
WBC: 7.7 10*3/uL (ref 3.8–10.8)

## 2020-03-19 LAB — HEPATIC FUNCTION PANEL
AG Ratio: 1.8 (calc) (ref 1.0–2.5)
ALT: 33 U/L — ABNORMAL HIGH (ref 6–29)
AST: 25 U/L (ref 10–35)
Albumin: 4.2 g/dL (ref 3.6–5.1)
Alkaline phosphatase (APISO): 75 U/L (ref 37–153)
Bilirubin, Direct: 0.1 mg/dL (ref 0.0–0.2)
Globulin: 2.4 g/dL (calc) (ref 1.9–3.7)
Indirect Bilirubin: 0.2 mg/dL (calc) (ref 0.2–1.2)
Total Bilirubin: 0.3 mg/dL (ref 0.2–1.2)
Total Protein: 6.6 g/dL (ref 6.1–8.1)

## 2020-03-19 LAB — T4, FREE: Free T4: 1.2 ng/dL (ref 0.8–1.8)

## 2020-03-19 LAB — T3, FREE: T3, Free: 2.8 pg/mL (ref 2.3–4.2)

## 2020-03-19 LAB — TSH: TSH: 1.33 mIU/L

## 2020-03-19 NOTE — Progress Notes (Signed)
T3 and T4 were not released with other labs. [facility] called and additional lab test ordered. 24 - 48 hour turnaround time for both test.

## 2020-03-20 ENCOUNTER — Encounter: Payer: Self-pay | Admitting: General Practice

## 2020-03-21 ENCOUNTER — Telehealth: Payer: Self-pay

## 2020-03-21 ENCOUNTER — Other Ambulatory Visit: Payer: BC Managed Care – PPO

## 2020-03-21 ENCOUNTER — Encounter: Payer: Self-pay | Admitting: Gastroenterology

## 2020-03-21 ENCOUNTER — Ambulatory Visit (INDEPENDENT_AMBULATORY_CARE_PROVIDER_SITE_OTHER): Payer: BC Managed Care – PPO | Admitting: Gastroenterology

## 2020-03-21 VITALS — BP 124/82 | HR 82 | Temp 97.8°F | Ht 64.0 in | Wt 202.4 lb

## 2020-03-21 DIAGNOSIS — R7989 Other specified abnormal findings of blood chemistry: Secondary | ICD-10-CM

## 2020-03-21 DIAGNOSIS — K5100 Ulcerative (chronic) pancolitis without complications: Secondary | ICD-10-CM

## 2020-03-21 MED ORDER — ESOMEPRAZOLE MAGNESIUM 40 MG PO CPDR: 40.0000 mg | DELAYED_RELEASE_CAPSULE | Freq: Every day | ORAL | 11 refills | Status: DC

## 2020-03-21 MED ORDER — FAMOTIDINE 40 MG PO TABS: 40.0000 mg | ORAL_TABLET | Freq: Every day | ORAL | 11 refills | Status: DC

## 2020-03-21 MED ORDER — FAMOTIDINE 40 MG PO TABS: 40.0000 mg | ORAL_TABLET | Freq: Every day | ORAL | 3 refills | Status: DC

## 2020-03-21 MED ORDER — ESOMEPRAZOLE MAGNESIUM 40 MG PO CPDR: 40.0000 mg | DELAYED_RELEASE_CAPSULE | Freq: Every day | ORAL | 3 refills | Status: DC

## 2020-03-21 NOTE — Patient Instructions (Signed)
Your provider has requested that you go to the basement level for lab work before leaving today. Press "B" on the elevator. The lab is located at the first door on the left as you exit the elevator.  We have sent the following medications to your pharmacy for you to pick up at your convenience: Nexium and pepcid.  You have been scheduled for an abdominal ultrasound at [facility] (1st floor of hospital) on 03/28/20 at 10:30am. Please arrive 15 minutes prior to your appointment for registration. Make certain not to have anything to eat or drink 6 hours prior to your appointment. Should you need to reschedule your appointment, please contact radiology at [phone]. This test typically takes about 30 minutes to perform.  Due to recent changes in healthcare laws, you may see the results of your imaging and laboratory studies on MyChart before your provider has had a chance to review them.  We understand that in some cases there may be results that are confusing or concerning to you. Not all laboratory results come back in the same time frame and the provider may be waiting for multiple results in order to interpret others.  Please give [place] 48 hours in order for your provider to thoroughly review all the results before contacting the office for clarification of your results.   Thank you for choosing me and [facility].  Pricilla Riffle. Dagoberto Ligas., MD., Marval Regal

## 2020-03-21 NOTE — Telephone Encounter (Signed)
Called patient to inform her we sent referral to Dr. Migdalia Dk office to medical weight management. Also that they will contact her with appt date and time. Patient verbalized understanding.

## 2020-03-21 NOTE — Telephone Encounter (Signed)
-----   Message from Ladene Artist, MD sent at 03/21/2020 12:32 PM EDT ----- Referral to Dr. Kalman Drape group for weight loss.

## 2020-03-21 NOTE — Addendum Note (Signed)
Addended by: Dorisann Frames L on: 03/21/2020 01:34 PM   Modules accepted: Orders

## 2020-03-21 NOTE — Progress Notes (Signed)
    History of Present Illness: This is a 54 year old female with ulcerative pancolitis. Last office visit 08/2018 and colonoscopy 09/2018.  She receives Remicade infusions at [facility] and has been doing well.  She has no colorectal complaints.  Her reflux symptoms are very well controlled on her current regimen.  She has had regular blood work obtained [employer] I have reviewed blood work from February 26, 2020 with a minimally elevated sodium at 146 otherwise normal CMP, sed rate 5, CRP less than 1, CBC normal.  QuantiFERON gold was negative in August 2020.  Current Medications, Allergies, Past Medical History, Past Surgical History, Family History and Social History were reviewed in [employer] record.   Physical Exam: General: Well developed, well nourished, no acute distress Head: Normocephalic and atraumatic Eyes:  sclerae anicteric, EOMI Ears: Normal auditory acuity Mouth: Not examined, mask on during Covid-19 pandemic Lungs: Clear throughout to auscultation Heart: Regular rate and rhythm; no murmurs, rubs or bruits Abdomen: Soft, non tender and non distended. No masses, hepatosplenomegaly or hernias noted. Normal Bowel sounds Rectal: Not done Musculoskeletal: Symmetrical with no gross deformities  Pulses:  Normal pulses noted Extremities: No clubbing, cyanosis, edema or deformities noted Neurological: Alert oriented x 4, grossly nonfocal Psychological:  Alert and cooperative. Normal mood and affect   Assessment and Recommendations:  1.  Ulcerative pancolitis, well controlled. Continue Remicade infusions.  Obtain HBsAg.  Surveillance colonoscopy recommended in 09/2020.   2. GERD. Continue Nexium 40 mg po qd and famotidine 40 mg po qd.   3. Elevated LFTs.  ALT has been intermittently mildly elevated.  HBsAg as above.  Schedule abdominal ultrasound.  4.  Status post cholecystectomy.  5. BMI=34.74. Weight loss clinic referral.

## 2020-03-24 LAB — HEPATITIS B SURFACE ANTIGEN: Hepatitis B Surface Ag: NONREACTIVE

## 2020-03-26 ENCOUNTER — Other Ambulatory Visit: Payer: Self-pay

## 2020-03-26 ENCOUNTER — Encounter: Payer: Self-pay | Admitting: Neurology

## 2020-03-26 ENCOUNTER — Ambulatory Visit (INDEPENDENT_AMBULATORY_CARE_PROVIDER_SITE_OTHER): Payer: BC Managed Care – PPO | Admitting: Neurology

## 2020-03-26 VITALS — BP 124/86 | HR 93 | Temp 97.9°F | Ht 64.0 in | Wt 201.0 lb

## 2020-03-26 DIAGNOSIS — R55 Syncope and collapse: Secondary | ICD-10-CM

## 2020-03-26 DIAGNOSIS — W19XXXD Unspecified fall, subsequent encounter: Secondary | ICD-10-CM | POA: Insufficient documentation

## 2020-03-26 NOTE — Patient Instructions (Signed)
Near-Syncope Near-syncope is when you suddenly get weak or dizzy, or you feel like you might pass out (faint). This may also be called presyncope. This is due to a lack of blood flow to the brain. During an episode of near-syncope, you may:  Feel dizzy, weak, or light-headed.  Feel sick to your stomach (nauseous).  See all white or all black.  See spots.  Have cold, clammy skin. This condition is caused by a sudden decrease in blood flow to the brain. This decrease can result from various causes, but most of those causes are not dangerous. However, near-syncope may be a sign of a serious medical problem, so it is important to seek medical care. Follow these instructions at home: Medicines  Take over-the-counter and prescription medicines only as told by your doctor.  If you are taking blood pressure or heart medicine, get up slowly and spend many minutes getting ready to sit and then stand. This can help with dizziness. General instructions  Be aware of any changes in your symptoms.  Talk with your doctor about your symptoms. You may need to have testing to find the cause of your near-syncope.  If you start to feel like you might pass out, lie down right away. Raise (elevate) your feet above the level of your heart. Breathe deeply and steadily. Wait until all of the symptoms are gone.  Have someone stay with you until you feel stable.  Do not drive, use machinery, or play sports until your doctor says it is okay.  Drink enough fluid to keep your pee (urine) pale yellow.  Keep all follow-up visits as told by your doctor. This is important. Get help right away if you:  Have a seizure.  Have pain in your: ? Chest. ? Belly (abdomen). ? Back.  Faint once or more than once.  Have a very bad headache.  Are bleeding from your mouth or butt.  Have black or tarry poop (stool).  Have a very fast or uneven heartbeat (palpitations).  Are mixed up (confused).  Have trouble  walking.  Are very weak.  Have trouble seeing. These symptoms may be an emergency. Do not wait to see if the symptoms will go away. Get medical help right away. Call your local emergency services (911 in the [place] Do not drive yourself to the hospital. Summary  Near-syncope is when you suddenly get weak or dizzy, or you feel like you might pass out (faint).  This condition is caused by a lack of blood flow to the brain.  Near-syncope may be a sign of a serious medical problem, so it is important to seek medical care. This information is not intended to replace advice given to you by your health care provider. Make sure you discuss any questions you have with your health care provider. Document Revised: 03/09/2019 Document Reviewed: 10/04/2018 Elsevier Patient Education  2020 Elsevier Inc.  

## 2020-03-26 NOTE — Progress Notes (Signed)
Provider:  Larey Seat, M D  Referring Provider: Midge Minium, MD Primary Care Physician:  Midge Minium, MD  Chief Complaint  Patient presents with  . New Patient (Initial Visit)    pt alone, rm 10. pt presents today because her partner and daughter have noticed more then her that she is having increase in frequency of falls. pt describes that she feels these falls are related to clumsiness due to uneven pavement, she attempted heels on a evening out and ankle rolled, couple of falls were r/t on vacation she had a few drinks and fell. she describes no dizziness. she has only had 1 episode where she went to ED because she felt like she was going to pass out.  . Other     when EMS checked her out she did have orthrostatics, BP dropped 70/30 standing. the took her in.     HPI:  SANDIA PFUND is a 54 y.o. female is seen here upon a referral from ED and from Dr. Birdie Riddle for near syncope.   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   Mrs. Myrla Halsted was seen here about 5 years ago with a chief concern of restless legs at the time but have not been giving her any trouble since she was given iron supplement.  She has a history of hypertension, hyperlipidemia, ulcerative colitis on REMICADE - She  was eating dinner when she started to feel lightheaded on 04 March 2020.  A friend that was with her noted that he was very pale and slightly diaphoretic.  She did not feel nauseated but she did feel the urge to lie down.  When EMS was called they noted a blood pressure as low as 70/30 and she got promptly dizzy again when she stood up.  By the time she had reached the ED she had felt back to baseline.  She had no chest pain, no sense of depression or tightness there was no neck stiffness, no back or neck pain.  She was not in any state of agitation at the time she experienced a spell, she did mention there as she did today that she had some falls in the recent past she was in advocate on a  vacation in [place] and slipped on a wet floor for a surface.  She did feel somewhat less balance because she was wearing high heels and her hands held at loss.  But there have been some difficulties with stumbling on uneven ground over the last several months. There were 4 falls in 2020- while going down stairs. She feels that her foot caught the carpet.  Her daughter,  who attends with a face time conference,  feels she slips easily on minor irregularities on the ground.   She is more fidgety and forgetfull.     ED noted low potassium.     Review of Systems: Out of a complete 14 system review, the patient complains of only the following symptoms, and all other reviewed systems are negative.  word finding delay, forgetting facts,less focussed, distractable. Some falls.    Social History   Socioeconomic History  . Marital status: Divorced    Spouse name: Not on file  . Number of children: 3  . Years of education: Not on file  . Highest education level: Not on file  Occupational History  . Occupation: Ecologist: [employer]  Tobacco Use  . Smoking status: Former Smoker  Packs/day: 0.50    Years: 25.00    Pack years: 12.50    Types: Cigarettes    Quit date: 02/27/2006    Years since quitting: 14.0  . Smokeless tobacco: Never Used  Substance and Sexual Activity  . Alcohol use: Yes    Alcohol/week: 3.0 standard drinks    Types: 3 Glasses of wine per week    Comment: every other weekend socially  . Drug use: No  . Sexual activity: Yes  Other Topics Concern  . Not on file  Social History Narrative   1-2 cups of tea a day.   Currently lives with her partner having. She divorced. She likes to walk daily doing exercise. She now does pet care.   Social Determinants of Health   Financial Resource Strain:   . Difficulty of Paying Living Expenses:   Food Insecurity:   . Worried About Charity fundraiser in the Last Year:   . Arboriculturist in the Last  Year:   Transportation Needs:   . Film/video editor (Medical):   Marland Kitchen Lack of Transportation (Non-Medical):   Physical Activity:   . Days of Exercise per Week:   . Minutes of Exercise per Session:   Stress:   . Feeling of Stress :   Social Connections:   . Frequency of Communication with Friends and Family:   . Frequency of Social Gatherings with Friends and Family:   . Attends Religious Services:   . Active Member of Clubs or Organizations:   . Attends Archivist Meetings:   Marland Kitchen Marital Status:   Intimate Partner Violence:   . Fear of Current or Ex-Partner:   . Emotionally Abused:   Marland Kitchen Physically Abused:   . Sexually Abused:     Family History  Problem Relation Age of Onset  . Prostate cancer Father   . Hyperlipidemia Father   . Barrett's esophagus Father   . Cancer Father        prostate  . Colon cancer Father 86       appendical adenocarcinoma 2011  . Diabetes Sister   . Diabetes Other        Grandparents  . Heart attack Maternal Grandfather   . Heart disease Maternal Grandfather   . Colitis Maternal Grandfather   . Ulcerative colitis Mother   . Ulcerative colitis Maternal Grandmother   . Crohn's disease Brother   . Esophageal cancer Neg Hx   . Rectal cancer Neg Hx   . Stomach cancer Neg Hx   . Allergic rhinitis Neg Hx   . Angioedema Neg Hx   . Asthma Neg Hx   . Atopy Neg Hx   . Eczema Neg Hx   . Immunodeficiency Neg Hx   . Colon polyps Neg Hx     Past Medical History:  Diagnosis Date  . Allergy   . Anxiety   . Arthritis    Osteo arthritis - bil knees  . Asthma    as a young adult related to allergy flare  . Atypical nevi 08/25/2018   1. LEFT MID BACK (MILD) - NO TX, 2. RIGHT CHEST (SEVERE) - W/S  . Atypical nevi 09/14/2018   MID BACK (SEVERE) - W/S  . Atypical nevi 11/13/2018   LEFT POST. NECK INCIDENTRAL MOLE (MILD)  . Atypical nevi 11/27/2018   RIGHT CHEST + MARGIN  . Atypical nevus 01/02/2018   LEFT UPPER BACK (SEVERE) - W/S  .  Atypical nevus 01/04/2019   LEFT  STERNUM ( MODERATE)  . Atypical nevus 01/04/2019   LEFT POST NECK INF. (SEVERE)  . Atypical nevus 01/04/2019   RECURRENT MID BACK - [facility] DR. PAVLIS  . Basal cell carcinoma 06/06/2008   LEFT UPPER SHOULDER PROX. @ [facility]  . Blood transfusion without reported diagnosis    from [facility] several  . Depression   . Gastric ulcer   . GERD (gastroesophageal reflux disease)   . Heart murmur   . Hyperlipidemia   . Iron deficiency anemia   . Melanoma ([place])    right cleavage, posterior left shoulder  . MM (malignant melanoma of skin) ([place]) 09/14/2018   LEFT POST. NECK MIS - EXC  . MM (malignant melanoma of skin) ([place]) 09/14/2018   RIGHT CHEST MIS - EXC  . Restless leg syndrome   . Seizures ([place])    2-3 d/t hypogylcermia - last seizure was 1998  . Sleep apnea    sleep study showed mild sleep apnea - no c-papa needed  . Ulcerative colitis    Dx at age 36; had been 57 years since her last 66 and then had a flareup recently in summer of 2017. Was then started back on Remicade and is doing well since.    Past Surgical History:  Procedure Laterality Date  . APPENDECTOMY    . BREAST ENHANCEMENT SURGERY  2004  . CHOLECYSTECTOMY  2011  . COLONOSCOPY    . right eye lasix     02/17/12  . TONSILLECTOMY AND ADENOIDECTOMY    . TUBAL LIGATION    . UPPER GASTROINTESTINAL ENDOSCOPY    . Uterine ablation    . WISDOM TOOTH EXTRACTION      Current Outpatient Medications  Medication Sig Dispense Refill  . acetaminophen (TYLENOL) 500 MG tablet Take 1,000 mg by mouth every 6 (six) hours as needed for headache.    . albuterol (PROVENTIL HFA;VENTOLIN HFA) 108 (90 Base) MCG/ACT inhaler Inhale 2 puffs into the lungs every 6 (six) hours as needed for wheezing or shortness of breath. 1 Inhaler 2  . B Complex-C (SUPER B COMPLEX PO) Take 1 tablet by mouth daily.    Marland Kitchen buPROPion (WELLBUTRIN XL) 150 MG 24 hr tablet Take 300 mg by mouth daily.   3  . CALCIUM  PO Take 1,500 mg by mouth daily.    . Chlorpheniramine Maleate (CHLOR-TRIMETON PO) Take 2 tablets by mouth daily.    Marland Kitchen esomeprazole (NEXIUM) 40 MG capsule Take 1 capsule (40 mg total) by mouth daily. 90 capsule 3  . famotidine (PEPCID) 40 MG tablet Take 1 tablet (40 mg total) by mouth daily. 90 tablet 3  . hydrochlorothiazide (HYDRODIURIL) 12.5 MG tablet Take 1 tablet (12.5 mg total) by mouth daily. 30 tablet 3  . inFLIXimab 500 mg in sodium chloride 0.9 % 200 mL Inject 500 mg into the vein every 6 (six) weeks. 448 mg    . Multiple Vitamin (DAILY-VITAMIN PO) Take 5,000 Units by mouth daily. Vitamin D3/K2    . OVER THE COUNTER MEDICATION 2 Doses daily. GOLI Apple Cider Vinegar Gummies 2/day    . potassium chloride SA (KLOR-CON) 20 MEQ tablet Take one tablet twice a day for five days, then one tablet once a day 35 tablet 0  . Probiotic Product (PROBIOTIC DAILY PO) Take 1 capsule by mouth daily.    . Red Yeast Rice Extract (RED YEAST RICE PO) Take 1,200 mg by mouth daily.    Marland Kitchen OVER THE COUNTER MEDICATION Take  1 tablet by mouth daily. Juice plus     No current facility-administered medications for this visit.    Allergies as of 03/26/2020 - Review Complete 03/26/2020  Allergen Reaction Noted  . Tagamet [cimetidine] Anaphylaxis 02/09/2007  . Effexor [venlafaxine]  10/06/2018  . Mobic [meloxicam] Swelling 12/18/2015  . Ultram [tramadol] Swelling 09/07/2016    Vitals: BP 124/86   Pulse 93   Temp 97.9 F (36.6 C)   Ht 5' 4"  (1.626 m)   Wt 201 lb (91.2 kg)   LMP 09/02/2013   BMI 34.50 kg/m  Last Weight:  Wt Readings from Last 1 Encounters:  03/26/20 201 lb (91.2 kg)   Last Height:   Ht Readings from Last 1 Encounters:  03/26/20 5' 4"  (1.626 m)    Physical exam:  General: The patient is awake, alert and appears not in acute distress. The patient is well groomed. Head: Normocephalic, atraumatic. Neck is supple.  Cardiovascular:  Regular rate and rhythm, without  murmurs or carotid  bruit, and without distended neck veins. Respiratory: Lungs are clear to auscultation. Skin:  Without evidence of edema, or rash Trunk: BMI is 34.5  elevated and patient  has normal posture.  Neurologic exam : The patient is awake and alert, oriented to place and time.  Memory subjective impaired  inattentive. There is a normal attention span & concentration ability. Speech is fluent without dysarthria, dysphonia or aphasia. Mood and affect are appropriate.  Cranial nerves: Pupils are equal and briskly reactive to light. Funduscopic exam deferred.  Extraocular movements  in vertical and horizontal planes were not smooth, but intact and without nystagmus.  Visual fields by finger perimetry are intact. Hearing to finger rub intact. Facial sensation intact to fine touch. Facial motor strength is symmetric and tongue and uvula move midline.  Tongue protrusion into either cheek is normal. Shoulder shrug is normal.   Motor exam:  Normal tone ,muscle bulk and symmetric  strength in all extremities. She had reduced grip strength.  Sensory:  Fine touch, pinprick and vibration were normal in all extremities. Proprioception was normal.  Coordination: Rapid alternating movements in the fingers/hands were normal. Finger-to-nose maneuver  with evidence of tremor. Some twitching. Gait and station: Patient walks without assistive device and is able unassisted to walk, walks fast and with normal base, turned with 2 steps.  Strength within normal limits. Stance is stable and normal. Tandem gait is unfragmented. Romberg testing is negative  Deep tendon reflexes: in the  upper and lower extremities are symmetric and intact. Babinski maneuver response deferred.    Assessment:  45 minutes - After physical and neurologic examination, review of laboratory studies, imaging, neurophysiology testing and pre-existing records, assessment is that of :   Vasovagal near-syncope with hypotension. This may have been related  to hypokalemia. She takes 12.5 mg of HCTZ. She had a higher diastolic BP   Remicade treatments for autoimmune colitis, may affect sensory nerves. No neuropathy. No gait impairment. Wore flip flops when she fell.   Forgetfulness  Or inattentive? - will need to make a second appointment for this testing.    Plan:  Treatment plan and additional workup :  Rv for memory testing.        Asencion Partridge Zayan Delvecchio MD 03/26/2020

## 2020-03-28 ENCOUNTER — Other Ambulatory Visit: Payer: Self-pay

## 2020-03-28 ENCOUNTER — Ambulatory Visit (HOSPITAL_COMMUNITY)
Admission: RE | Admit: 2020-03-28 | Discharge: 2020-03-28 | Disposition: A | Discharge status: Home / Self Care | Payer: BC Managed Care – PPO | Source: Ambulatory Visit | Attending: Gastroenterology | Admitting: Gastroenterology

## 2020-03-28 DIAGNOSIS — R7989 Other specified abnormal findings of blood chemistry: Secondary | ICD-10-CM | POA: Diagnosis present

## 2020-04-03 ENCOUNTER — Encounter (INDEPENDENT_AMBULATORY_CARE_PROVIDER_SITE_OTHER): Payer: Self-pay

## 2020-04-08 ENCOUNTER — Other Ambulatory Visit: Payer: Self-pay | Admitting: General Practice

## 2020-04-08 ENCOUNTER — Ambulatory Visit (INDEPENDENT_AMBULATORY_CARE_PROVIDER_SITE_OTHER): Payer: BC Managed Care – PPO | Admitting: Family Medicine

## 2020-04-08 ENCOUNTER — Telehealth (INDEPENDENT_AMBULATORY_CARE_PROVIDER_SITE_OTHER): Payer: Self-pay | Admitting: Psychology

## 2020-04-08 ENCOUNTER — Other Ambulatory Visit: Payer: Self-pay

## 2020-04-08 ENCOUNTER — Encounter (INDEPENDENT_AMBULATORY_CARE_PROVIDER_SITE_OTHER): Payer: Self-pay | Admitting: Family Medicine

## 2020-04-08 VITALS — BP 122/80 | HR 76 | Temp 98.1°F | Ht 64.0 in | Wt 196.0 lb

## 2020-04-08 DIAGNOSIS — Z6833 Body mass index (BMI) 33.0-33.9, adult: Secondary | ICD-10-CM

## 2020-04-08 DIAGNOSIS — I10 Essential (primary) hypertension: Secondary | ICD-10-CM

## 2020-04-08 DIAGNOSIS — E559 Vitamin D deficiency, unspecified: Secondary | ICD-10-CM | POA: Diagnosis not present

## 2020-04-08 DIAGNOSIS — R0602 Shortness of breath: Secondary | ICD-10-CM | POA: Diagnosis not present

## 2020-04-08 DIAGNOSIS — R5383 Other fatigue: Secondary | ICD-10-CM

## 2020-04-08 DIAGNOSIS — Z1331 Encounter for screening for depression: Secondary | ICD-10-CM | POA: Diagnosis not present

## 2020-04-08 DIAGNOSIS — K5100 Ulcerative (chronic) pancolitis without complications: Secondary | ICD-10-CM

## 2020-04-08 DIAGNOSIS — Z9189 Other specified personal risk factors, not elsewhere classified: Secondary | ICD-10-CM

## 2020-04-08 DIAGNOSIS — E538 Deficiency of other specified B group vitamins: Secondary | ICD-10-CM

## 2020-04-08 DIAGNOSIS — Z0289 Encounter for other administrative examinations: Secondary | ICD-10-CM

## 2020-04-08 DIAGNOSIS — F5089 Other specified eating disorder: Secondary | ICD-10-CM

## 2020-04-08 DIAGNOSIS — E669 Obesity, unspecified: Secondary | ICD-10-CM

## 2020-04-08 MED ORDER — POTASSIUM CHLORIDE CRYS ER 20 MEQ PO TBCR: 20.0000 meq | EXTENDED_RELEASE_TABLET | Freq: Every day | ORAL | 0 refills | Status: DC

## 2020-04-08 NOTE — Telephone Encounter (Signed)
  Office: [phone]  /  Fax: [phone]  Date of Call: Apr 08, 2020  Time of Call: 12:31pm Duration of Call: 3 minutes Provider: Glennie Isle, PsyD  CONTENT: This provider called Melissa Cohen as she was scheduled for a new patient appointment with this provider. 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 Melissa Cohen acknowledged understanding. She provided verbal consent for this provider to e-mail a list of referral options. No evidence of suicidal and homicidal ideation, plan, or intent  PLAN: This provider will e-mail Melissa Cohen a list of referral options. No further follow-up planned by this provider.

## 2020-04-09 LAB — CBC WITH DIFFERENTIAL/PLATELET
Basophils Absolute: 0.1 10*3/uL (ref 0.0–0.2)
Basos: 1 %
EOS (ABSOLUTE): 0.1 10*3/uL (ref 0.0–0.4)
Eos: 2 %
Hematocrit: 45.3 % (ref 34.0–46.6)
Hemoglobin: 15.2 g/dL (ref 11.1–15.9)
Immature Grans (Abs): 0 10*3/uL (ref 0.0–0.1)
Immature Granulocytes: 0 %
Lymphocytes Absolute: 3.2 10*3/uL — ABNORMAL HIGH (ref 0.7–3.1)
Lymphs: 44 %
MCH: 30.2 pg (ref 26.6–33.0)
MCHC: 33.6 g/dL (ref 31.5–35.7)
MCV: 90 fL (ref 79–97)
Monocytes Absolute: 0.6 10*3/uL (ref 0.1–0.9)
Monocytes: 9 %
Neutrophils Absolute: 3.2 10*3/uL (ref 1.4–7.0)
Neutrophils: 44 %
Platelets: 288 10*3/uL (ref 150–450)
RBC: 5.04 x10E6/uL (ref 3.77–5.28)
RDW: 13.4 % (ref 11.7–15.4)
WBC: 7.2 10*3/uL (ref 3.4–10.8)

## 2020-04-09 LAB — LIPID PANEL WITH LDL/HDL RATIO
Cholesterol, Total: 227 mg/dL — ABNORMAL HIGH (ref 100–199)
HDL: 56 mg/dL (ref >39)
LDL Chol Calc (NIH): 139 mg/dL — ABNORMAL HIGH (ref 0–99)
LDL/HDL Ratio: 2.5 ratio (ref 0.0–3.2)
Triglycerides: 179 mg/dL — ABNORMAL HIGH (ref 0–149)
VLDL Cholesterol Cal: 32 mg/dL (ref 5–40)

## 2020-04-09 LAB — VITAMIN D 25 HYDROXY (VIT D DEFICIENCY, FRACTURES): Vit D, 25-Hydroxy: 61 ng/mL (ref 30.0–100.0)

## 2020-04-09 LAB — COMPREHENSIVE METABOLIC PANEL
ALT: 35 IU/L — ABNORMAL HIGH (ref 0–32)
AST: 23 IU/L (ref 0–40)
Albumin/Globulin Ratio: 2.1 (ref 1.2–2.2)
Albumin: 4.8 g/dL (ref 3.8–4.9)
Alkaline Phosphatase: 77 IU/L (ref 39–117)
BUN/Creatinine Ratio: 19 (ref 9–23)
BUN: 16 mg/dL (ref 6–24)
Bilirubin Total: 0.4 mg/dL (ref 0.0–1.2)
CO2: 24 mmol/L (ref 20–29)
Calcium: 9.7 mg/dL (ref 8.7–10.2)
Chloride: 102 mmol/L (ref 96–106)
Creatinine, Ser: 0.83 mg/dL (ref 0.57–1.00)
GFR calc Af Amer: 92 mL/min/{1.73_m2} (ref >59)
GFR calc non Af Amer: 80 mL/min/{1.73_m2} (ref >59)
Globulin, Total: 2.3 g/dL (ref 1.5–4.5)
Glucose: 87 mg/dL (ref 65–99)
Potassium: 4 mmol/L (ref 3.5–5.2)
Sodium: 141 mmol/L (ref 134–144)
Total Protein: 7.1 g/dL (ref 6.0–8.5)

## 2020-04-09 LAB — T3: T3, Total: 132 ng/dL (ref 71–180)

## 2020-04-09 LAB — TSH: TSH: 1.46 u[IU]/mL (ref 0.450–4.500)

## 2020-04-09 LAB — T4, FREE: Free T4: 1.12 ng/dL (ref 0.82–1.77)

## 2020-04-09 LAB — HEMOGLOBIN A1C
Est. average glucose Bld gHb Est-mCnc: 103 mg/dL
Hgb A1c MFr Bld: 5.2 % (ref 4.8–5.6)

## 2020-04-09 LAB — INSULIN, RANDOM: INSULIN: 19.6 u[IU]/mL (ref 2.6–24.9)

## 2020-04-09 LAB — FOLATE: Folate: >20 ng/mL (ref >3.0)

## 2020-04-09 LAB — VITAMIN B12: Vitamin B-12: 1551 pg/mL — ABNORMAL HIGH (ref 232–1245)

## 2020-04-09 NOTE — Progress Notes (Signed)
Chief Complaint:   OBESITY Melissa Cohen (MR# [id number]) is a 54 y.o. female who presents for evaluation and treatment of obesity and related comorbidities. Current BMI is Body mass index is 33.64 kg/m. Melissa Cohen has been struggling with her weight for many years and has been unsuccessful in either losing weight, maintaining weight loss, or reaching her healthy weight goal.  Melissa Cohen is currently in the action stage of change and ready to dedicate time achieving and maintaining a healthier weight. Melissa Cohen is interested in becoming our patient and working on intensive lifestyle modifications including (but not limited to) diet and exercise for weight loss.  Melissa Cohen's habits were reviewed today and are as follows: she thinks her family will eat healthier with her, she struggles with family and or coworkers weight loss sabotage, her desired weight loss is 56-61 lbs, she has been heavy most of her life, she started gaining weight after her 17rd child at 47 years old, her heaviest weight ever was 207 pounds, she has significant food cravings issues, she snacks frequently in the evenings, she skips meals frequently, she is frequently drinking liquids with calories, she frequently makes poor food choices, she frequently eats larger portions than normal, she has binge eating behaviors and she struggles with emotional eating.  Depression Screen [name]'s Food and Mood (modified PHQ-9) score was 15.  Depression screen PHQ 2/9 04/08/2020  Decreased Interest 3  Down, Depressed, Hopeless 1  PHQ - 2 Score 4  Altered sleeping 2  Tired, decreased energy 3  Change in appetite 1  Feeling bad or failure about yourself  1  Trouble concentrating 3  Moving slowly or fidgety/restless 1  Suicidal thoughts 0  PHQ-9 Score 15  Difficult doing work/chores Very difficult   Subjective:   1. Other fatigue Melissa Cohen admits to daytime somnolence and denies waking up still tired. Patent has a history of symptoms of  daytime fatigue. Melissa Cohen generally gets 4 or 7 hours of sleep per night, and states that she has generally restful sleep. Snoring is present. Apneic episodes are not present. Epworth Sleepiness Score is 9.  2. Shortness of breath on exertion Melissa Cohen notes increasing shortness of breath with exercising and seems to be worsening over time with weight gain. She notes getting out of breath sooner with activity than she used to. This has not gotten worse recently. [name] denies shortness of breath at rest or orthopnea.  3. Vitamin D deficiency Melissa Cohen is on Vit D-K2 supplementation.  4. Essential hypertension [name]'s blood pressure is at goal during her office visit today. She is on hydrochlorothiazide 12.5 mg q daily, and also K+ 20 meq q daily.  5. Ulcerative pancolitis without complication ([place]) [name] is on Remicade infusion every 6 weeks.  6. Vitamin B12 deficiency Melissa Cohen has B12 deficiency with a history of ulcerative pancolitis. She is on B complex vitamins OTC.  7. Other disorder of eating Per Melissa Cohen's intake history, she experienced anorexia during her teenage years, and bulimia in her early 25's.  8. At risk for diarrhea Keydi is at higher risk of diarrhea due to history of ulcerative pancolitis.   Assessment/Plan:   1. Other fatigue Melissa Cohen does feel that her weight is causing her energy to be lower than it should be. Fatigue may be related to obesity, depression or many other causes. Labs will be ordered, and in the meanwhile, Melissa Cohen will focus on self care including making healthy food choices, increasing physical activity and focusing on stress reduction.  -  Comprehensive metabolic panel - CBC with Differential/Platelet - Hemoglobin A1c - Insulin, random - T3 - T4, free - TSH  2. Shortness of breath on exertion Melissa Cohen does feel that she gets out of breath more easily that she used to when she exercises. Melissa Cohen's shortness of breath appears to be obesity related and  exercise induced. She has agreed to work on weight loss and gradually increase exercise to treat her exercise induced shortness of breath. Will continue to monitor closely.  - Lipid Panel With LDL/HDL Ratio  3. Vitamin D deficiency Low Vitamin D level contributes to fatigue and are associated with obesity, breast, and colon cancer. We will check labs today. Archana will follow-up for routine testing of Vitamin D, at least 2-3 times per year to avoid over-replacement.  - VITAMIN D 25 Hydroxy (Vit-D Deficiency, Fractures)  4. Essential hypertension Melissa Cohen start her Category 2 meal plan, and will continue to work on healthy weight loss and exercise to improve blood pressure control. We will watch for signs of hypotension as she continues her lifestyle modifications. We will check labs today.  - Comprehensive metabolic panel  5. Ulcerative pancolitis without complication ([place]) Melissa Cohen will continue to follow up closely with GI. We will check labs today.  - Vitamin B12 - Folate  6. Vitamin B12 deficiency The diagnosis was reviewed with the patient. Melissa Cohen will start her Category 2 meal plan. We will check labs today and will continue to monitor. Orders and follow up as documented in patient record.  - CBC with Differential/Platelet - Vitamin B12 - Folate  7. Other disorder of eating We will refer to Dr. Mallie Mussel, our Bariatric Psychologist for evaluation.  8. Depression screening Melissa Cohen had a positive depression screening. Depression is commonly associated with obesity and often results in emotional eating behaviors. We will monitor this closely and work on CBT to help improve the non-hunger eating patterns. Referral to Psychology may be required if no improvement is seen as she continues in our clinic.  9. At risk for diarrhea Melissa Cohen was given approximately 30 minutes of diarrhea prevention counseling today. She is 54 y.o. female and has risk factors for diarrhea including medications and  changes in diet. We discussed intensive lifestyle modifications today with an emphasis on specific weight loss instructions including dietary strategies.   Repetitive spaced learning was employed today to elicit superior memory formation and behavioral change.  10. Class 1 obesity with serious comorbidity and body mass index (BMI) of 33.0 to 33.9 in adult, unspecified obesity type Ireene is currently in the action stage of change and her goal is to continue with weight loss efforts. I recommend Yoselyn begin the structured treatment plan as follows:  She has agreed to the Category 2 Plan.  Exercise goals: As is.   Behavioral modification strategies: increasing lean protein intake, decreasing simple carbohydrates, decreasing eating out, no skipping meals, meal planning and cooking strategies, travel eating strategies and celebration eating strategies.  She was informed of the importance of frequent follow-up visits to maximize her success with intensive lifestyle modifications for her multiple health conditions. She was informed we would discuss her lab results at her next visit unless there is a critical issue that needs to be addressed sooner. Evy agreed to keep her next visit at the agreed upon time to discuss these results.  Objective:   Blood pressure 122/80, pulse 76, temperature 98.1 F (36.7 C), temperature source Oral, height 5' 4"  (1.626 m), weight 196 lb (88.9 kg), last menstrual period  09/02/2013, SpO2 99 %. Body mass index is 33.64 kg/m.  EKG: Normal sinus rhythm, rate 81 BPM.  Indirect Calorimeter completed today shows a VO2 of 179 and a REE of 1246.  Her calculated basal metabolic rate is 5003 thus her basal metabolic rate is worse than expected.  General: Cooperative, alert, well developed, in no acute distress. HEENT: Conjunctivae and lids unremarkable. Cardiovascular: Regular rhythm.  Lungs: Normal work of breathing. Neurologic: No focal deficits.   Lab Results    Component Value Date   CREATININE 0.83 04/08/2020   BUN 16 04/08/2020   NA 141 04/08/2020   K 4.0 04/08/2020   CL 102 04/08/2020   CO2 24 04/08/2020   Lab Results  Component Value Date   ALT 35 (H) 04/08/2020   AST 23 04/08/2020   ALKPHOS 77 04/08/2020   BILITOT 0.4 04/08/2020   Lab Results  Component Value Date   HGBA1C 5.2 04/08/2020   Lab Results  Component Value Date   INSULIN 19.6 04/08/2020   Lab Results  Component Value Date   TSH 1.460 04/08/2020   Lab Results  Component Value Date   CHOL 227 (H) 04/08/2020   HDL 56 04/08/2020   LDLCALC 139 (H) 04/08/2020   LDLDIRECT 104.1 12/07/2011   TRIG 179 (H) 04/08/2020   CHOLHDL 4.3 03/14/2020   Lab Results  Component Value Date   WBC 7.2 04/08/2020   HGB 15.2 04/08/2020   HCT 45.3 04/08/2020   MCV 90 04/08/2020   PLT 288 04/08/2020   Lab Results  Component Value Date   IRON 44 09/07/2016   TIBC 452 (H) 09/07/2016   FERRITIN 10 (L) 09/07/2016   Attestation Statements:   Reviewed by clinician on day of visit: allergies, medications, problem list, medical history, surgical history, family history, social history, and previous encounter notes.   I, Trixie Dredge, am acting as transcriptionist for Dennard Nip, MD.  I have reviewed the above documentation for accuracy and completeness, and I agree with the above. - Dennard Nip, MD

## 2020-04-22 ENCOUNTER — Ambulatory Visit (INDEPENDENT_AMBULATORY_CARE_PROVIDER_SITE_OTHER): Payer: BC Managed Care – PPO | Admitting: Family Medicine

## 2020-04-22 ENCOUNTER — Encounter (INDEPENDENT_AMBULATORY_CARE_PROVIDER_SITE_OTHER): Payer: Self-pay | Admitting: Family Medicine

## 2020-04-22 ENCOUNTER — Other Ambulatory Visit: Payer: Self-pay

## 2020-04-22 VITALS — BP 113/76 | HR 82 | Temp 98.7°F | Ht 64.0 in | Wt 193.0 lb

## 2020-04-22 DIAGNOSIS — E8881 Metabolic syndrome: Secondary | ICD-10-CM

## 2020-04-22 DIAGNOSIS — E538 Deficiency of other specified B group vitamins: Secondary | ICD-10-CM

## 2020-04-22 DIAGNOSIS — E7849 Other hyperlipidemia: Secondary | ICD-10-CM | POA: Diagnosis not present

## 2020-04-22 DIAGNOSIS — Z9189 Other specified personal risk factors, not elsewhere classified: Secondary | ICD-10-CM

## 2020-04-22 DIAGNOSIS — E669 Obesity, unspecified: Secondary | ICD-10-CM

## 2020-04-22 DIAGNOSIS — I10 Essential (primary) hypertension: Secondary | ICD-10-CM | POA: Diagnosis not present

## 2020-04-22 DIAGNOSIS — Z6833 Body mass index (BMI) 33.0-33.9, adult: Secondary | ICD-10-CM

## 2020-04-22 MED ORDER — METFORMIN HCL 500 MG PO TABS: 500.0000 mg | ORAL_TABLET | Freq: Every day | ORAL | 0 refills | Status: DC

## 2020-04-23 ENCOUNTER — Telehealth (INDEPENDENT_AMBULATORY_CARE_PROVIDER_SITE_OTHER): Payer: BC Managed Care – PPO | Admitting: Psychology

## 2020-04-23 NOTE — Progress Notes (Signed)
Chief Complaint:   OBESITY Melissa Cohen is here to discuss her progress with her obesity treatment plan along with follow-up of her obesity related diagnoses. Melissa Cohen is on the Category 2 Plan and states she is following her eating plan approximately 95% of the time. Melissa Cohen states she is walking 6,000-10,000 steps per day.   Today's visit was #: 2 Starting weight: 196 lbs Starting date: 04/08/2020 Today's weight: 193 lbs Today's date: 04/22/2020 Total lbs lost to date: 3 Total lbs lost since last in-office visit: 3  Interim History: Melissa Cohen notes she got tired quickly of meat protein as prescribed at dinner, and preferred to substitute with other protein sources. She enjoyed lunch sandwich and fruit options. The last few days she reports increased sweet cravings that she satisfied with [employer] bars. She has completely eliminated fast food the last 2 weeks.  Subjective:   1. Insulin resistance Melissa Cohen's insulin level on 04/08/2020 was 19.6, A1c 5.2, and blood glucose 87. She is not on metformin, however she reports increased hunger levels the past few weeks. I discussed labs with the patient.  2. Essential hypertension Melissa Cohen's blood pressure is well controlled at her office visit today. She is on hydrochlorothiazide 12.5 mg q daily, and K+ 20 meq daily. CMP on 04/08/2020 was stable. I discussed labs with the patient today.  3. Other hyperlipidemia Melissa Cohen's lipid panel on 04/08/2020, showed a total cholesterol of 227, triglycerides 179, HDL 56, and LDL 139. Her previous triglycerides on 03/14/2020 was 361. She was not fasting at the time. She is not on statin therapy. I discussed labs with the patient today.  4. Vitamin B 12 deficiency Melissa Cohen's B12 level on 04/08/2020 was 1,551. She was on OTC B complex. She stopped after she reviewed recent labs. I discussed labs with the patient today.  5. At risk for diabetes mellitus Melissa Cohen is at higher than average risk for developing diabetes due to her  obesity and insulin resistance.   Assessment/Plan:   1. Insulin resistance Melissa Cohen will continue her meal plan, and will continue to work on weight loss, exercise, and decreasing simple carbohydrates to help decrease the risk of diabetes. Melissa Cohen agreed to start metformin 500 mg q daily with no refills. We will recheck labs in 3 months. Melissa Cohen agreed to follow-up with [place] as directed to closely monitor her progress.  - metFORMIN (GLUCOPHAGE) 500 MG tablet; Take 1 tablet (500 mg total) by mouth daily with breakfast.  Dispense: 30 tablet; Refill: 0  2. Essential hypertension Melissa Cohen will continue her meal plan, and will continue her current diuretics. She will continue working on healthy weight loss and exercise to improve blood pressure control. We will watch for signs of hypotension as she continues her lifestyle modifications.  3. Other hyperlipidemia Cardiovascular risk and specific lipid/LDL goals reviewed. We discussed several lifestyle modifications today. Melissa Cohen will continue her meal plan, and will continue to work on exercise and weight loss efforts. We will recheck labs in 3 months. Orders and follow up as documented in patient record.   4. Vitamin B 12 deficiency The diagnosis was reviewed with the patient. We will continue to monitor.  We will recheck labs in 3 months. Orders and follow up as documented in patient record.  5. At risk for diabetes mellitus Melissa Cohen was given approximately 30 minutes of diabetes education and counseling today. We discussed intensive lifestyle modifications today with an emphasis on weight loss as well as increasing exercise and decreasing simple carbohydrates in her diet. We  also reviewed medication options with an emphasis on risk versus benefit of those discussed.   Repetitive spaced learning was employed today to elicit superior memory formation and behavioral change.  6. Class 1 obesity with serious comorbidity and body mass index (BMI) of 33.0 to 33.9  in adult, unspecified obesity type Melissa Cohen is currently in the action stage of change. As such, her goal is to continue with weight loss efforts. She has agreed to the Category 2 Plan.   Exercise goals: As is.  Behavioral modification strategies: increasing lean protein intake, decreasing simple carbohydrates, no skipping meals, meal planning and cooking strategies, travel eating strategies and celebration eating strategies.  Melissa Cohen has agreed to follow-up with our clinic in 2 weeks. She was informed of the importance of frequent follow-up visits to maximize her success with intensive lifestyle modifications for her multiple health conditions.   Objective:   Blood pressure 113/76, pulse 82, temperature 98.7 F (37.1 C), temperature source Oral, height 5' 4"  (1.626 m), weight 193 lb (87.5 kg), last menstrual period 09/02/2013, SpO2 97 %. Body mass index is 33.13 kg/m.  General: Cooperative, alert, well developed, in no acute distress. HEENT: Conjunctivae and lids unremarkable. Cardiovascular: Regular rhythm.  Lungs: Normal work of breathing. Neurologic: No focal deficits.   Lab Results  Component Value Date   CREATININE 0.83 04/08/2020   BUN 16 04/08/2020   NA 141 04/08/2020   K 4.0 04/08/2020   CL 102 04/08/2020   CO2 24 04/08/2020   Lab Results  Component Value Date   ALT 35 (H) 04/08/2020   AST 23 04/08/2020   ALKPHOS 77 04/08/2020   BILITOT 0.4 04/08/2020   Lab Results  Component Value Date   HGBA1C 5.2 04/08/2020   Lab Results  Component Value Date   INSULIN 19.6 04/08/2020   Lab Results  Component Value Date   TSH 1.460 04/08/2020   Lab Results  Component Value Date   CHOL 227 (H) 04/08/2020   HDL 56 04/08/2020   LDLCALC 139 (H) 04/08/2020   LDLDIRECT 104.1 12/07/2011   TRIG 179 (H) 04/08/2020   CHOLHDL 4.3 03/14/2020   Lab Results  Component Value Date   WBC 7.2 04/08/2020   HGB 15.2 04/08/2020   HCT 45.3 04/08/2020   MCV 90 04/08/2020   PLT 288  04/08/2020   Lab Results  Component Value Date   IRON 44 09/07/2016   TIBC 452 (H) 09/07/2016   FERRITIN 10 (L) 09/07/2016   Attestation Statements:   Reviewed by clinician on day of visit: allergies, medications, problem list, medical history, surgical history, family history, social history, and previous encounter notes.   I, Trixie Dredge, am acting as transcriptionist for Dennard Nip, MD.  I have reviewed the above documentation for accuracy and completeness, and I agree with the above. -  Dennard Nip, MD

## 2020-04-29 ENCOUNTER — Ambulatory Visit (INDEPENDENT_AMBULATORY_CARE_PROVIDER_SITE_OTHER): Payer: BC Managed Care – PPO | Admitting: Adult Health

## 2020-04-29 ENCOUNTER — Other Ambulatory Visit: Payer: Self-pay

## 2020-04-29 ENCOUNTER — Encounter: Payer: Self-pay | Admitting: Adult Health

## 2020-04-29 ENCOUNTER — Encounter: Payer: Self-pay | Admitting: Psychology

## 2020-04-29 DIAGNOSIS — R413 Other amnesia: Secondary | ICD-10-CM | POA: Diagnosis not present

## 2020-04-29 NOTE — Progress Notes (Signed)
PATIENT: Melissa Cohen DOB: 18-Aug-1966  REASON FOR VISIT: follow up Melissa FROM: patient  Melissa OF PRESENT ILLNESS: Today 04/29/20:  Melissa Cohen is a 54 year old female with a Melissa of memory disturbance.  She returns today for memory evaluation.  She reports that she feels that she does not remember conversations between her and her partner.  She states that particular she does remember details.  She also feels that she is not as organized and unable to multitask like she used to.  She reports that looking at certain tasks she feels very overwhelmed.  Reports that she feels depressed occasionally but is on an antidepressant.  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  Reports that she is able to complete all ADLs independently.  Denies any trouble managing her own finances.  She returns today for an evaluation.  Melissa Cohen is a 54 y.o. female is seen here upon a referral from ED and from Dr. Birdie Riddle for near syncope.   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   Melissa Cohen was seen here about 5 years ago with a chief concern of restless legs at the time but have not been giving her any trouble since she was given iron supplement.  She has a Melissa of hypertension, hyperlipidemia, ulcerative colitis on REMICADE - She  was eating dinner when she started to feel lightheaded on 04 March 2020.  A friend that was with her noted that he was very pale and slightly diaphoretic.  She did not feel nauseated but she did feel the urge to lie down.  When EMS was called they noted a blood pressure as low as 70/30 and she got promptly dizzy again when she stood up.  By the time she had reached the ED she had felt back to baseline.  She had no chest pain, no sense of depression or tightness there was no neck stiffness, no back or neck pain.  She was not in any state of agitation at the time she experienced a spell, she did mention there as she did today  that she had some falls in the recent past she was in advocate on a vacation in [place] and slipped on a wet floor for a surface.  She did feel somewhat less balance because she was wearing high heels and her hands held at loss.  But there have been some difficulties with stumbling on uneven ground over the last several months. There were 4 falls in 2020- while going down stairs. She feels that her foot caught the carpet.  Her daughter,  who attends with a face time conference,  feels she slips easily on minor irregularities on the ground.   She is more fidgety and forgetfull.     ED noted low potassium.     REVIEW OF SYSTEMS: Out of a complete 14 system review of symptoms, the patient complains only of the following symptoms, and all other reviewed systems are negative.  See HPI  ALLERGIES: Allergies  Allergen Reactions  . Tagamet [Cimetidine] Anaphylaxis    REACTION: anaphalactic shock  . Effexor [Venlafaxine]     Swelling   . Mobic [Meloxicam] Swelling  . Ultram [Tramadol] Swelling    HOME MEDICATIONS: Outpatient Medications Prior to Visit  Medication Sig Dispense Refill  . acetaminophen (TYLENOL) 500 MG tablet Take 1,000 mg by mouth every 6 (six) hours as needed for headache.    . B Complex-C (SUPER  B COMPLEX PO) Take 1 tablet by mouth every other day.    Marland Kitchen buPROPion (WELLBUTRIN XL) 300 MG 24 hr tablet Take 300 mg by mouth daily.    . celecoxib (CELEBREX) 200 MG capsule Take 200 mg by mouth. Take one capsule by mouth twice daily as needed    . Chlorpheniramine Maleate (CHLOR-TRIMETON PO) Take 2 tablets by mouth daily as needed.     . Cholecalciferol (VITAMIN D3) 125 MCG (5000 UT) CAPS Take 1 capsule by mouth daily.    Marland Kitchen esomeprazole (NEXIUM) 40 MG capsule Take 1 capsule (40 mg total) by mouth daily. 90 capsule 3  . famotidine (PEPCID) 40 MG tablet Take 1 tablet (40 mg total) by mouth daily. 90 tablet 3  . hydrochlorothiazide (HYDRODIURIL) 12.5 MG tablet Take 1 tablet  (12.5 mg total) by mouth daily. 30 tablet 3  . ibuprofen (ADVIL) 600 MG tablet Take 600 mg by mouth every 6 (six) hours as needed.    . inFLIXimab 500 mg in sodium chloride 0.9 % 200 mL Inject 500 mg into the vein every 6 (six) weeks. 448 mg    . metFORMIN (GLUCOPHAGE) 500 MG tablet Take 1 tablet (500 mg total) by mouth daily with breakfast. 30 tablet 0  . potassium chloride SA (KLOR-CON) 20 MEQ tablet Take 1 tablet (20 mEq total) by mouth daily. 90 tablet 0  . Probiotic Product (PROBIOTIC DAILY PO) Take 1 capsule by mouth daily.    . Red Yeast Rice Extract (RED YEAST RICE PO) Take 1,200 mg by mouth daily.    . Simethicone (GAS-X PO) Take 80 mg by mouth daily as needed.     No facility-administered medications prior to visit.    PAST MEDICAL Melissa: Past Medical Melissa:  Diagnosis Date  . Allergy   . Anemia   . Anxiety   . Arthritis    Osteo arthritis - bil knees  . Asthma    as a young adult related to allergy flare  . Atypical nevi 08/25/2018   1. LEFT MID BACK (MILD) - NO TX, 2. RIGHT CHEST (SEVERE) - W/S  . Atypical nevi 09/14/2018   MID BACK (SEVERE) - W/S  . Atypical nevi 11/13/2018   LEFT POST. NECK INCIDENTRAL MOLE (MILD)  . Atypical nevi 11/27/2018   RIGHT CHEST + MARGIN  . Atypical nevus 01/02/2018   LEFT UPPER BACK (SEVERE) - W/S  . Atypical nevus 01/04/2019   LEFT STERNUM ( MODERATE)  . Atypical nevus 01/04/2019   LEFT POST NECK INF. (SEVERE)  . Atypical nevus 01/04/2019   RECURRENT MID BACK - [facility] DR. PAVLIS  . Back pain   . Basal cell carcinoma 06/06/2008   LEFT UPPER SHOULDER PROX. @ [facility]  . Blood transfusion without reported diagnosis    from [facility] several  . Chest pain   . Depression   . Fatty liver   . Gallbladder problem   . Gastric ulcer   . GERD (gastroesophageal reflux disease)   . Heart murmur   . Hyperlipidemia   . Hypertension   . Iron deficiency anemia   . Joint pain   . Knee pain   . Lower extremity edema   .  Melanoma ([place])    right cleavage, posterior left shoulder  . MM (malignant melanoma of skin) ([place]) 09/14/2018   LEFT POST. NECK MIS - EXC  . MM (malignant melanoma of skin) ([place]) 09/14/2018   RIGHT CHEST MIS - EXC  . Obesity   .  Restless leg syndrome   . 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 Shortness of breath on exertion   . Sleep apnea    sleep study showed mild sleep apnea - no c-papa needed  . Ulcerative colitis    Dx at age 1; had been 16 years since her last 14 and then had a flareup recently in summer of 2017. Was then started back on Remicade and is doing well since.  . Vitamin D deficiency     PAST SURGICAL Melissa: Past Surgical Melissa:  Procedure Laterality Date  . APPENDECTOMY    . BREAST ENHANCEMENT SURGERY  2004  . CHOLECYSTECTOMY  2011  . COLONOSCOPY    . right eye lasix     02/17/12  . TONSILLECTOMY AND ADENOIDECTOMY    . TUBAL LIGATION    . UPPER GASTROINTESTINAL ENDOSCOPY    . Uterine ablation    . WISDOM TOOTH EXTRACTION      FAMILY Melissa: Family Melissa  Problem Relation Age of Onset  . Prostate cancer Father   . Hyperlipidemia Father   . Barrett's esophagus Father   . Cancer Father        prostate  . Colon cancer Father 36       appendical adenocarcinoma 2011  . Hypertension Father   . Diabetes Sister   . Diabetes Other        Grandparents  . Heart attack Maternal Grandfather   . Heart disease Maternal Grandfather   . Colitis Maternal Grandfather   . Ulcerative colitis Mother   . Hypertension Mother   . Hyperlipidemia Mother   . Depression Mother   . Ulcerative colitis Maternal Grandmother   . Crohn's disease Brother   . Esophageal cancer Neg Hx   . Rectal cancer Neg Hx   . Stomach cancer Neg Hx   . Allergic rhinitis Neg Hx   . Angioedema Neg Hx   . Asthma Neg Hx   . Atopy Neg Hx   . Eczema Neg Hx   . Immunodeficiency Neg Hx   . Colon polyps Neg Hx     SOCIAL Melissa: Social Melissa    Socioeconomic Melissa  . Marital status: Significant Other    Spouse name: Not on file  . Number of children: 3  . Years of education: Not on file  . Highest education level: Not on file  Occupational Melissa  . Occupation: Loss adjuster, chartered care provider    Employer: [employer]  Tobacco Use  . Smoking status: Former Smoker    Packs/day: 0.50    Years: 25.00    Pack years: 12.50    Types: Cigarettes    Quit date: 02/27/2006    Years since quitting: 14.1  . Smokeless tobacco: Never Used  Substance and Sexual Activity  . Alcohol use: Yes    Alcohol/week: 3.0 standard drinks    Types: 3 Glasses of wine per week    Comment: every other weekend socially  . Drug use: No  . Sexual activity: Yes  Other Topics Concern  . Not on file  Social Melissa Narrative   1-2 cups of tea a day.   Currently lives with her partner having. She divorced. She likes to walk daily doing exercise. She now does pet care.   Social Determinants of Health   Financial Resource Strain:   . Difficulty of Paying Living Expenses:   Food Insecurity:   . Worried About Charity fundraiser in the  Last Year:   . [place] in the Last Year:   Transportation Needs:   . Film/video editor (Medical):   Marland Kitchen Lack of Transportation (Non-Medical):   Physical Activity:   . Days of Exercise per Week:   . Minutes of Exercise per Session:   Stress:   . Feeling of Stress :   Social Connections:   . Frequency of Communication with Friends and Family:   . Frequency of Social Gatherings with Friends and Family:   . Attends Religious Services:   . Active Member of Clubs or Organizations:   . Attends Archivist Meetings:   Marland Kitchen Marital Status:   Intimate Partner Violence:   . Fear of Current or Ex-Partner:   . Emotionally Abused:   Marland Kitchen Physically Abused:   . Sexually Abused:       PHYSICAL EXAM  Vitals:   04/29/20 1403  BP: 127/83  Pulse: 97  Weight: 191 lb (86.6 kg)  Height: 5' 4"  (1.626 m)   Body mass  index is 32.79 kg/m.   MMSE - Mini Mental State Exam 04/29/2020  Orientation to time 5  Orientation to Place 5  Registration 3  Attention/ Calculation 5  Recall 3  Language- name 2 objects 2  Language- repeat 1  Language- follow 3 step command 3  Language- read & follow direction 1  Write a sentence 1  Copy design 1  Total score 30     Generalized: Well developed, in no acute distress   Neurological examination  Mentation: Alert oriented to time, place, Melissa taking. Follows all commands speech and language fluent Cranial nerve II-XII: Pupils were equal round reactive to light. Extraocular movements were full, visual field were full on confrontational test.  . Head turning and shoulder shrug  were normal and symmetric. Motor: The motor testing reveals 5 over 5 strength of all 4 extremities. Good symmetric motor tone is noted throughout.  Sensory: Sensory testing is intact to soft touch on all 4 extremities. No evidence of extinction is noted.  Coordination: Cerebellar testing reveals good finger-nose-finger and heel-to-shin bilaterally.  Gait and station: Gait is normal.  Reflexes: Deep tendon reflexes are symmetric and normal bilaterally.   DIAGNOSTIC DATA (LABS, IMAGING, TESTING) - I reviewed patient records, labs, notes, testing and imaging myself where available.  Lab Results  Component Value Date   WBC 7.2 04/08/2020   HGB 15.2 04/08/2020   HCT 45.3 04/08/2020   MCV 90 04/08/2020   PLT 288 04/08/2020      Component Value Date/Time   NA 141 04/08/2020 1300   K 4.0 04/08/2020 1300   CL 102 04/08/2020 1300   CO2 24 04/08/2020 1300   GLUCOSE 87 04/08/2020 1300   GLUCOSE 106 (H) 03/14/2020 1441   BUN 16 04/08/2020 1300   CREATININE 0.83 04/08/2020 1300   CREATININE 0.95 03/14/2020 1441   CALCIUM 9.7 04/08/2020 1300   PROT 7.1 04/08/2020 1300   ALBUMIN 4.8 04/08/2020 1300   AST 23 04/08/2020 1300   ALT 35 (H) 04/08/2020 1300   ALKPHOS 77 04/08/2020 1300    BILITOT 0.4 04/08/2020 1300   GFRNONAA 80 04/08/2020 1300   GFRAA 92 04/08/2020 1300   Lab Results  Component Value Date   CHOL 227 (H) 04/08/2020   HDL 56 04/08/2020   LDLCALC 139 (H) 04/08/2020   LDLDIRECT 104.1 12/07/2011   TRIG 179 (H) 04/08/2020   CHOLHDL 4.3 03/14/2020   Lab Results  Component Value Date  HGBA1C 5.2 04/08/2020   Lab Results  Component Value Date   VITAMINB12 1,551 (H) 04/08/2020   Lab Results  Component Value Date   TSH 1.460 04/08/2020      ASSESSMENT AND PLAN 54 y.o. year old female  has a past medical Melissa of Allergy, Anemia, Anxiety, Arthritis, Asthma, Atypical nevi (08/25/2018), Atypical nevi (09/14/2018), Atypical nevi (11/13/2018), Atypical nevi (11/27/2018), Atypical nevus (01/02/2018), Atypical nevus (01/04/2019), Atypical nevus (01/04/2019), Atypical nevus (01/04/2019), Back pain, Basal cell carcinoma (06/06/2008), Blood transfusion without reported diagnosis, Chest pain, Depression, Fatty liver, Gallbladder problem, Gastric ulcer, GERD (gastroesophageal reflux disease), Heart murmur, Hyperlipidemia, Hypertension, Iron deficiency anemia, Joint pain, Knee pain, Lower extremity edema, Melanoma ([place]), MM (malignant melanoma of skin) ([place]) (09/14/2018), MM (malignant melanoma of skin) ([place]) (09/14/2018), Obesity, Restless leg syndrome, Seizures ([place]), Shortness of breath on exertion, Sleep apnea, Ulcerative colitis, and Vitamin D deficiency. here with:   Memory disturbance    MMSE 30/30  MRI Brain with and without contrast ordered to evaluate for other potential causes of memory disturbance  Referral for neuropsychological testing for memory disturbance  Recently had lab work all of which was unremarkable  We will follow-up in 6 months or sooner if needed  I spent 20 minutes of face-to-face and non-face-to-face time with patient.  This included previsit chart review, lab review, study review, order entry, electronic health record  documentation, patient education.  Ward Givens, MSN, NP-C 04/29/2020, 1:58 PM [facility] [address] [place], [place] [address] [phone]

## 2020-04-29 NOTE — Patient Instructions (Signed)
Your Plan:  MMSE 30/30 MRI brain ordered  Referral for additional memory testing     Thank you for coming to see [place] at [facility]. I hope we have been able to provide you high quality care today.  You may receive a patient satisfaction survey over the next few weeks. We would appreciate your feedback and comments so that we may continue to improve ourselves and the health of our patients.

## 2020-05-07 ENCOUNTER — Telehealth: Payer: Self-pay | Admitting: Adult Health

## 2020-05-07 NOTE — Telephone Encounter (Signed)
Pt is asking for a call from [facility] re: the scheduling of her MRI

## 2020-05-07 NOTE — Telephone Encounter (Signed)
LVM for pt to call back about scheduling mri  BCBS Auth: [id number] (exp. 05/06/20 to 11/01/20)

## 2020-05-08 NOTE — Telephone Encounter (Signed)
I spoke to the patient she is scheduled at [facility] for 05/21/20.

## 2020-05-12 ENCOUNTER — Ambulatory Visit (INDEPENDENT_AMBULATORY_CARE_PROVIDER_SITE_OTHER): Payer: BC Managed Care – PPO | Admitting: Family Medicine

## 2020-05-12 ENCOUNTER — Encounter (INDEPENDENT_AMBULATORY_CARE_PROVIDER_SITE_OTHER): Payer: Self-pay | Admitting: Family Medicine

## 2020-05-12 ENCOUNTER — Other Ambulatory Visit: Payer: Self-pay

## 2020-05-12 VITALS — BP 109/61 | HR 83 | Temp 98.8°F | Ht 64.0 in | Wt 187.0 lb

## 2020-05-12 DIAGNOSIS — E8881 Metabolic syndrome: Secondary | ICD-10-CM | POA: Diagnosis not present

## 2020-05-12 DIAGNOSIS — Z9189 Other specified personal risk factors, not elsewhere classified: Secondary | ICD-10-CM

## 2020-05-12 DIAGNOSIS — E538 Deficiency of other specified B group vitamins: Secondary | ICD-10-CM | POA: Diagnosis not present

## 2020-05-12 DIAGNOSIS — E669 Obesity, unspecified: Secondary | ICD-10-CM

## 2020-05-12 DIAGNOSIS — I10 Essential (primary) hypertension: Secondary | ICD-10-CM

## 2020-05-12 DIAGNOSIS — Z6832 Body mass index (BMI) 32.0-32.9, adult: Secondary | ICD-10-CM

## 2020-05-12 MED ORDER — METFORMIN HCL 500 MG PO TABS: 500.0000 mg | ORAL_TABLET | Freq: Two times a day (BID) | ORAL | 0 refills | Status: DC

## 2020-05-14 NOTE — Progress Notes (Signed)
Chief Complaint:   OBESITY Ananya is here to discuss her progress with her obesity treatment plan along with follow-up of her obesity related diagnoses. Sophira is on the Category 2 Plan and states she is following her eating plan approximately 90% of the time. Natori states she is walking 7,000-10,000 steps 7 times per week.  Today's visit was #: 3 Starting weight: 196 lbs Starting date: 04/08/2020 Today's weight: 187 lbs Today's date: 05/12/2020 Total lbs lost to date: 9 Total lbs lost since last in-office visit: 6  Interim History: Shulamit is sticking to the diet with minor tweaks of switching meals around and time of day. She started metformin 3 weeks ago and still hungry in the evenings. She is a Agricultural engineer.  She loves milkshakes and donuts.  She is currently eating [place] bars 2-3 times per day.  Subjective:   1. Insulin resistance Elani's insulin level was 19.6 approximately 1 month ago. She is tolerating her metformin well with no side effects.  2. Essential hypertension Calleigh's blood pressure was 109/61 today, low but well controlled. Cardiovascular ROS: no chest pain or dyspnea on exertion. She denies lightheadedness/ dizziness, no HA, SOB  BP Readings from Last 3 Encounters:  05/12/20 109/61  04/29/20 127/83  04/22/20 113/76   3. Vitamin B12 deficiency Renesme's last B12 level 1 month ago was 1551. She has decreased her B12 intake to every other day since then.  4. At risk for diabetes mellitus Kaylanni is at higher than average risk for developing diabetes due to her obesity.    Assessment/Plan:   1. Insulin resistance Delmi will continue to work on weight loss, diet, exercise, and decreasing simple carbohydrates to help decrease the risk of diabetes. Andrianna agreed to change metformin to 500 mg BID and we will refill for 60 days with no refill. Sahalie agreed to follow-up with [place] as directed to closely monitor her progress.  - metFORMIN (GLUCOPHAGE)  500 MG tablet; Take 1 tablet (500 mg total) by mouth 2 (two) times daily with a meal.  Dispense: 60 tablet; Refill: 0  2. Essential hypertension Stanislawa will continue her current treatment plan, and will continue diet, exercise, and weight loss to improve blood pressure control. We will watch for signs of hypotension as she continues her lifestyle modifications.   3. Vitamin B12 deficiency The diagnosis was reviewed with the patient. Counseling provided today, see below. We will continue to monitor. Kanyla will continue her current dose, and we will recheck labs in 3 months after change in dose. Orders and follow up as documented in patient record.  Counseling . The body needs vitamin B12: to make red blood cells; to make DNA; and to help the nerves work properly so they can carry messages from the brain to the body.  . The main causes of vitamin B12 deficiency include dietary deficiency, digestive diseases, pernicious anemia, and having a surgery in which part of the stomach or small intestine is removed.  . Certain medicines can make it harder for the body to absorb vitamin B12. These medicines include: heartburn medications; some antibiotics; some medications used to treat diabetes, gout, and high cholesterol.  . In some cases, there are no symptoms of this condition. If the condition leads to anemia or nerve damage, various symptoms can occur, such as weakness or fatigue, shortness of breath, and numbness or tingling in your hands and feet.   . Treatment:  o May include taking vitamin B12 supplements.  o Avoid  alcohol.  o Eat lots of healthy foods that contain vitamin B12: - Beef, pork, chicken, [place], and organ meats, such as liver.  - Seafood: This includes clams, rainbow trout, salmon, tuna, and haddock. Eggs.  - Cereal and dairy products that are fortified: This means that vitamin B12 has been added to the food.   4. At risk for diabetes mellitus Monicia was given approximately 30  minutes of diabetes education and counseling today. We discussed intensive lifestyle modifications today with an emphasis on weight loss as well as increasing exercise and decreasing simple carbohydrates in her diet. We also reviewed medication options with an emphasis on risk versus benefit of those discussed.   Repetitive spaced learning was employed today to elicit superior memory formation and behavioral change.  5. Class 1 obesity with serious comorbidity and body mass index (BMI) of 32.0 to 32.9 in adult, unspecified obesity type Jecenia is currently in the action stage of change. As such, her goal is to continue with weight loss efforts. She has agreed to the Category 2 Plan.   Exercise goals: As is.  Behavioral modification strategies: increasing water intake, meal planning and cooking strategies and planning for success.  Oreoluwa has agreed to follow-up with our clinic in 2 weeks. She was informed of the importance of frequent follow-up visits to maximize her success with intensive lifestyle modifications for her multiple health conditions.   Objective:   Blood pressure 109/61, pulse 83, temperature 98.8 F (37.1 C), temperature source Oral, height 5' 4"  (1.626 m), weight 187 lb (84.8 kg), last menstrual period 09/02/2013, SpO2 98 %. Body mass index is 32.1 kg/m.  General: Cooperative, alert, well developed, in no acute distress. HEENT: Conjunctivae and lids unremarkable. Cardiovascular: Regular rhythm.  Lungs: Normal work of breathing. Neurologic: No focal deficits.   Lab Results  Component Value Date   CREATININE 0.83 04/08/2020   BUN 16 04/08/2020   NA 141 04/08/2020   K 4.0 04/08/2020   CL 102 04/08/2020   CO2 24 04/08/2020   Lab Results  Component Value Date   ALT 35 (H) 04/08/2020   AST 23 04/08/2020   ALKPHOS 77 04/08/2020   BILITOT 0.4 04/08/2020   Lab Results  Component Value Date   HGBA1C 5.2 04/08/2020   Lab Results  Component Value Date   INSULIN  19.6 04/08/2020   Lab Results  Component Value Date   TSH 1.460 04/08/2020   Lab Results  Component Value Date   CHOL 227 (H) 04/08/2020   HDL 56 04/08/2020   LDLCALC 139 (H) 04/08/2020   LDLDIRECT 104.1 12/07/2011   TRIG 179 (H) 04/08/2020   CHOLHDL 4.3 03/14/2020   Lab Results  Component Value Date   WBC 7.2 04/08/2020   HGB 15.2 04/08/2020   HCT 45.3 04/08/2020   MCV 90 04/08/2020   PLT 288 04/08/2020   Lab Results  Component Value Date   IRON 44 09/07/2016   TIBC 452 (H) 09/07/2016   FERRITIN 10 (L) 09/07/2016   Attestation Statements:   Reviewed by clinician on day of visit: allergies, medications, problem list, medical history, surgical history, family history, social history, and previous encounter notes.   Wilhemena Durie, am acting as transcriptionist for [employer], DO.  I have reviewed the above documentation for accuracy and completeness, and I agree with the above. Mellody Dance, DO

## 2020-05-21 ENCOUNTER — Ambulatory Visit (INDEPENDENT_AMBULATORY_CARE_PROVIDER_SITE_OTHER): Payer: BC Managed Care – PPO

## 2020-05-21 ENCOUNTER — Other Ambulatory Visit: Payer: Self-pay

## 2020-05-21 DIAGNOSIS — R413 Other amnesia: Secondary | ICD-10-CM | POA: Diagnosis not present

## 2020-05-21 MED ORDER — GADOBENATE DIMEGLUMINE 529 MG/ML IV SOLN
15.0000 mL | Freq: Once | INTRAVENOUS | Status: AC | PRN
  Administered 2020-05-21: 15 mL via INTRAVENOUS

## 2020-05-22 ENCOUNTER — Encounter: Payer: Self-pay | Admitting: Adult Health

## 2020-05-22 ENCOUNTER — Encounter: Payer: Self-pay | Admitting: Family Medicine

## 2020-05-26 ENCOUNTER — Ambulatory Visit (INDEPENDENT_AMBULATORY_CARE_PROVIDER_SITE_OTHER): Payer: BC Managed Care – PPO | Admitting: Adult Health

## 2020-05-26 ENCOUNTER — Other Ambulatory Visit: Payer: Self-pay

## 2020-05-26 ENCOUNTER — Encounter (INDEPENDENT_AMBULATORY_CARE_PROVIDER_SITE_OTHER): Payer: Self-pay | Admitting: Adult Health

## 2020-05-26 VITALS — BP 115/78 | HR 86 | Temp 98.4°F | Ht 64.0 in | Wt 188.0 lb

## 2020-05-26 DIAGNOSIS — Z9189 Other specified personal risk factors, not elsewhere classified: Secondary | ICD-10-CM

## 2020-05-26 DIAGNOSIS — Z6832 Body mass index (BMI) 32.0-32.9, adult: Secondary | ICD-10-CM

## 2020-05-26 DIAGNOSIS — E669 Obesity, unspecified: Secondary | ICD-10-CM

## 2020-05-26 DIAGNOSIS — E8881 Metabolic syndrome: Secondary | ICD-10-CM

## 2020-05-26 DIAGNOSIS — I10 Essential (primary) hypertension: Secondary | ICD-10-CM | POA: Diagnosis not present

## 2020-05-26 MED ORDER — METFORMIN HCL 500 MG PO TABS: 500.0000 mg | ORAL_TABLET | Freq: Three times a day (TID) | ORAL | 0 refills | Status: DC

## 2020-05-27 ENCOUNTER — Telehealth: Payer: Self-pay

## 2020-05-27 DIAGNOSIS — E8881 Metabolic syndrome: Secondary | ICD-10-CM | POA: Insufficient documentation

## 2020-05-27 DIAGNOSIS — E88819 Insulin resistance, unspecified: Secondary | ICD-10-CM | POA: Insufficient documentation

## 2020-05-27 NOTE — Progress Notes (Signed)
Chief Complaint:   OBESITY Melissa Cohen is here to discuss her progress with her obesity treatment plan along with follow-up of her obesity related diagnoses. Melissa Cohen is on the Category 2 Plan and states she is following her eating plan approximately 75% of the time. Melissa Cohen states she is walking 7,000-10,000 steps daily.   Today's visit was #: 4 Starting weight: 196 lbs Starting date: 04/08/2020 Today's weight: 188 lbs Today's date: 05/26/2020 Total lbs lost to date: 8 Total lbs lost since last in-office visit: 0  Interim History: Melissa Cohen continues to experience late afternoon/early evening hunger despite recent increase of metformin to BID. She has multiple vacations in July and August. She recently purchased a gallon water bottle to help her drink more water daily.  Subjective:   Insulin resistance. Melissa Cohen has a diagnosis of insulin resistance based on her elevated fasting insulin level >5. She continues to work on diet and exercise to decrease her risk of diabetes. Due to late afternoon/early evening polyphagia, metformin 500 mg was increased to BID 2 weeks ago. She denies GI upset, however, continues to experience hunger late in the day. She is agreeable to increasing her metformin dose. CMP 04/08/2020 was stable.  Lab Results  Component Value Date   INSULIN 19.6 04/08/2020   Lab Results  Component Value Date   HGBA1C 5.2 04/08/2020   Essential hypertension. Blood pressure is well controlled at today's office visit. Melissa Cohen is on HCTZ 12.5 mg daily and denies cardiac symptoms.  BP Readings from Last 3 Encounters:  05/26/20 115/78  05/12/20 109/61  04/29/20 127/83   Lab Results  Component Value Date   CREATININE 0.83 04/08/2020   CREATININE 0.95 03/14/2020   CREATININE 0.92 03/04/2020    At risk for diarrhea. Melissa Cohen is at higher risk of diarrhea due to increased metformin dose to TID to help decrease polyphagia. We recommended she take metformin with  meals.  Assessment/Plan:   Insulin resistance. Para will continue to work on weight loss, exercise, and decreasing simple carbohydrates to help decrease the risk of diabetes. Melissa Cohen agreed to follow-up with [place] as directed to closely monitor her progress. She will increase metFORMIN (GLUCOPHAGE) to 500 MG tablet TID #90 with 0 refills and will continue to follow the Category 2 meal plan.  Essential hypertension. Melissa is working on healthy weight loss and exercise to improve blood pressure control. We will watch for signs of hypotension as she continues her lifestyle modifications. She will continue her current anti-hypertensives as directed and will continue the Category 2 meal plan.  At risk for diarrhea. Melissa Cohen was given approximately 15 minutes of diarrhea prevention counseling today. She is 54 y.o. female and has risk factors for diarrhea including medications and changes in diet. We discussed intensive lifestyle modifications today with an emphasis on specific weight loss instructions including dietary strategies.   Repetitive spaced learning was employed today to elicit superior memory formation and behavioral change.  Class 1 obesity with serious comorbidity and body mass index (BMI) of 32.0 to 32.9 in adult, unspecified obesity type.  Melissa Cohen is currently in the action stage of change. As such, her goal is to continue with weight loss efforts. She has agreed to the Category 2 Plan.   Handout was provided on Additional Breakfast Options.  Exercise goals: Melissa Cohen will continue her current exercise regimen.   Behavioral modification strategies: no skipping meals, meal planning and cooking strategies, travel eating strategies, celebration eating strategies and planning for success.  Melissa Cohen has  agreed to follow-up with our clinic in 2 weeks. She was informed of the importance of frequent follow-up visits to maximize her success with intensive lifestyle modifications for her multiple health  conditions.   Objective:   Blood pressure 115/78, pulse 86, temperature 98.4 F (36.9 C), temperature source Oral, height 5' 4"  (1.626 m), weight 188 lb (85.3 kg), last menstrual period 09/02/2013, SpO2 99 %. Body mass index is 32.27 kg/m.  General: Cooperative, alert, well developed, in no acute distress. HEENT: Conjunctivae and lids unremarkable. Cardiovascular: Regular rhythm.  Lungs: Normal work of breathing. Neurologic: No focal deficits.   Lab Results  Component Value Date   CREATININE 0.83 04/08/2020   BUN 16 04/08/2020   NA 141 04/08/2020   K 4.0 04/08/2020   CL 102 04/08/2020   CO2 24 04/08/2020   Lab Results  Component Value Date   ALT 35 (H) 04/08/2020   AST 23 04/08/2020   ALKPHOS 77 04/08/2020   BILITOT 0.4 04/08/2020   Lab Results  Component Value Date   HGBA1C 5.2 04/08/2020   Lab Results  Component Value Date   INSULIN 19.6 04/08/2020   Lab Results  Component Value Date   TSH 1.460 04/08/2020   Lab Results  Component Value Date   CHOL 227 (H) 04/08/2020   HDL 56 04/08/2020   LDLCALC 139 (H) 04/08/2020   LDLDIRECT 104.1 12/07/2011   TRIG 179 (H) 04/08/2020   CHOLHDL 4.3 03/14/2020   Lab Results  Component Value Date   WBC 7.2 04/08/2020   HGB 15.2 04/08/2020   HCT 45.3 04/08/2020   MCV 90 04/08/2020   PLT 288 04/08/2020   Lab Results  Component Value Date   IRON 44 09/07/2016   TIBC 452 (H) 09/07/2016   FERRITIN 10 (L) 09/07/2016   Attestation Statements:   Reviewed by clinician on day of visit: allergies, medications, problem list, medical history, surgical history, family history, social history, and previous encounter notes.  I, Melissa Cohen, am acting as Location manager for [employer], NP-C   I have reviewed the above documentation for accuracy and completeness, and I agree with the above. -  Esaw Grandchild, NP

## 2020-05-27 NOTE — Telephone Encounter (Signed)
Pt verified by name and DOB, results given per provider, pt voiced understanding all question answered. 

## 2020-05-27 NOTE — Telephone Encounter (Signed)
-----   Message from Ward Givens, NP sent at 05/26/2020  1:37 PM EDT ----- MRI is normal

## 2020-06-04 ENCOUNTER — Telehealth: Payer: Self-pay | Admitting: Gastroenterology

## 2020-06-04 ENCOUNTER — Other Ambulatory Visit: Payer: Self-pay | Admitting: Family Medicine

## 2020-06-04 NOTE — Telephone Encounter (Signed)
Patient called to inquire about infusion center opening.  At this time, do not have any information when the infusion center will open.

## 2020-06-09 ENCOUNTER — Ambulatory Visit (INDEPENDENT_AMBULATORY_CARE_PROVIDER_SITE_OTHER): Payer: BC Managed Care – PPO | Admitting: Family Medicine

## 2020-06-09 ENCOUNTER — Other Ambulatory Visit: Payer: Self-pay

## 2020-06-09 ENCOUNTER — Encounter (INDEPENDENT_AMBULATORY_CARE_PROVIDER_SITE_OTHER): Payer: Self-pay | Admitting: Family Medicine

## 2020-06-09 VITALS — BP 119/84 | HR 89 | Temp 98.4°F | Ht 64.0 in | Wt 184.0 lb

## 2020-06-09 DIAGNOSIS — E8881 Metabolic syndrome: Secondary | ICD-10-CM | POA: Diagnosis not present

## 2020-06-09 DIAGNOSIS — Z6831 Body mass index (BMI) 31.0-31.9, adult: Secondary | ICD-10-CM | POA: Diagnosis not present

## 2020-06-09 DIAGNOSIS — Z9189 Other specified personal risk factors, not elsewhere classified: Secondary | ICD-10-CM | POA: Diagnosis not present

## 2020-06-09 DIAGNOSIS — E669 Obesity, unspecified: Secondary | ICD-10-CM

## 2020-06-09 MED ORDER — METFORMIN HCL 500 MG PO TABS: 500.0000 mg | ORAL_TABLET | Freq: Three times a day (TID) | ORAL | 0 refills | Status: DC

## 2020-06-11 NOTE — Progress Notes (Signed)
Chief Complaint:   OBESITY Melissa Cohen is here to discuss her progress with her obesity treatment plan along with follow-up of her obesity related diagnoses. Melissa Cohen is on the Category 2 Plan with breakfast options and states she is following her eating plan approximately 80% of the time. Melissa Cohen states she is walking the dogs for 20 minutes 7 times per week.  Today's visit was #: 5 Starting weight: 196 lbs Starting date: 04/08/2020 Today's weight: 184 lbs Today's date: 06/09/2020 Total lbs lost to date: 12 Total lbs lost since last in-office visit: 4  Interim History: Melissa Cohen continues to do well with weight loss on her Category 2 plan. She is walking daily and doing well with meal planning.  Subjective:   1. Insulin resistance Melissa Cohen is stable on metformin, and she notes some increased GI issues and possible hypoglycemia in the evening.  2. At risk for diabetes mellitus Melissa Cohen is at higher than average risk for developing diabetes due to her obesity.   Assessment/Plan:   1. Insulin resistance Melissa Cohen will continue to work on weight loss, exercise, and decreasing simple carbohydrates to help decrease the risk of diabetes. We will refill metformin for 1 month. Melissa Cohen is ok to decrease PM dose to 1/2 tablet. Melissa Cohen agreed to follow-up with [place] as directed to closely monitor her progress.  - metFORMIN (GLUCOPHAGE) 500 MG tablet; Take 1 tablet (500 mg total) by mouth 3 (three) times daily.  Dispense: 90 tablet; Refill: 0  2. At risk for diabetes mellitus Melissa Cohen was given approximately 15 minutes of diabetes education and counseling today. We discussed intensive lifestyle modifications today with an emphasis on weight loss as well as increasing exercise and decreasing simple carbohydrates in her diet. We also reviewed medication options with an emphasis on risk versus benefit of those discussed.   Repetitive spaced learning was employed today to elicit superior memory formation and  behavioral change.  3. Class 1 obesity with serious comorbidity and body mass index (BMI) of 31.0 to 31.9 in adult, unspecified obesity type Melissa Cohen is currently in the action stage of change. As such, her goal is to continue with weight loss efforts. She has agreed to the Category 2 Plan with breakfast options.   Exercise goals: As is.  Behavioral modification strategies: travel eating strategies.  Melissa Cohen has agreed to follow-up with our clinic in 3 weeks. She was informed of the importance of frequent follow-up visits to maximize her success with intensive lifestyle modifications for her multiple health conditions.   Objective:   Blood pressure 119/84, pulse 89, temperature 98.4 F (36.9 C), temperature source Oral, height 5' 4"  (1.626 m), weight 184 lb (83.5 kg), last menstrual period 09/02/2013, SpO2 97 %. Body mass index is 31.58 kg/m.  General: Cooperative, alert, well developed, in no acute distress. HEENT: Conjunctivae and lids unremarkable. Cardiovascular: Regular rhythm.  Lungs: Normal work of breathing. Neurologic: No focal deficits.   Lab Results  Component Value Date   CREATININE 0.83 04/08/2020   BUN 16 04/08/2020   NA 141 04/08/2020   K 4.0 04/08/2020   CL 102 04/08/2020   CO2 24 04/08/2020   Lab Results  Component Value Date   ALT 35 (H) 04/08/2020   AST 23 04/08/2020   ALKPHOS 77 04/08/2020   BILITOT 0.4 04/08/2020   Lab Results  Component Value Date   HGBA1C 5.2 04/08/2020   Lab Results  Component Value Date   INSULIN 19.6 04/08/2020   Lab Results  Component Value Date  TSH 1.460 04/08/2020   Lab Results  Component Value Date   CHOL 227 (H) 04/08/2020   HDL 56 04/08/2020   LDLCALC 139 (H) 04/08/2020   LDLDIRECT 104.1 12/07/2011   TRIG 179 (H) 04/08/2020   CHOLHDL 4.3 03/14/2020   Lab Results  Component Value Date   WBC 7.2 04/08/2020   HGB 15.2 04/08/2020   HCT 45.3 04/08/2020   MCV 90 04/08/2020   PLT 288 04/08/2020   Lab Results   Component Value Date   IRON 44 09/07/2016   TIBC 452 (H) 09/07/2016   FERRITIN 10 (L) 09/07/2016   Attestation Statements:   Reviewed by clinician on day of visit: allergies, medications, problem list, medical history, surgical history, family history, social history, and previous encounter notes.   I, Trixie Dredge, am acting as transcriptionist for Dennard Nip, MD.  I have reviewed the above documentation for accuracy and completeness, and I agree with the above. -  Dennard Nip, MD

## 2020-06-17 ENCOUNTER — Encounter: Payer: BC Managed Care – PPO | Admitting: Psychology

## 2020-06-24 ENCOUNTER — Encounter: Payer: Self-pay | Admitting: Family Medicine

## 2020-06-30 ENCOUNTER — Ambulatory Visit (INDEPENDENT_AMBULATORY_CARE_PROVIDER_SITE_OTHER): Payer: BC Managed Care – PPO | Admitting: Family Medicine

## 2020-07-03 ENCOUNTER — Encounter: Payer: BC Managed Care – PPO | Admitting: Psychology

## 2020-07-15 ENCOUNTER — Encounter: Payer: Self-pay | Admitting: Family Medicine

## 2020-07-15 MED ORDER — BUPROPION HCL ER (XL) 300 MG PO TB24: 300.0000 mg | ORAL_TABLET | Freq: Every day | ORAL | 1 refills | Status: DC

## 2020-07-15 MED ORDER — CELECOXIB 200 MG PO CAPS: 200.0000 mg | ORAL_CAPSULE | Freq: Two times a day (BID) | ORAL | 3 refills | Status: DC

## 2020-07-15 NOTE — Telephone Encounter (Signed)
Last OV was in April. Please advise?

## 2020-07-15 NOTE — Telephone Encounter (Signed)
Pt seen in April. Medication pended for PCP.

## 2020-07-16 ENCOUNTER — Other Ambulatory Visit: Payer: Self-pay

## 2020-07-16 ENCOUNTER — Encounter (INDEPENDENT_AMBULATORY_CARE_PROVIDER_SITE_OTHER): Payer: Self-pay | Admitting: Family Medicine

## 2020-07-16 ENCOUNTER — Ambulatory Visit (INDEPENDENT_AMBULATORY_CARE_PROVIDER_SITE_OTHER): Payer: BC Managed Care – PPO | Admitting: Family Medicine

## 2020-07-16 ENCOUNTER — Ambulatory Visit (INDEPENDENT_AMBULATORY_CARE_PROVIDER_SITE_OTHER): Payer: BC Managed Care – PPO | Admitting: Physician Assistant

## 2020-07-16 VITALS — BP 130/82 | HR 87 | Temp 98.3°F | Ht 64.0 in | Wt 182.0 lb

## 2020-07-16 DIAGNOSIS — E669 Obesity, unspecified: Secondary | ICD-10-CM | POA: Diagnosis not present

## 2020-07-16 DIAGNOSIS — I10 Essential (primary) hypertension: Secondary | ICD-10-CM

## 2020-07-16 DIAGNOSIS — Z6831 Body mass index (BMI) 31.0-31.9, adult: Secondary | ICD-10-CM

## 2020-07-16 DIAGNOSIS — E8881 Metabolic syndrome: Secondary | ICD-10-CM | POA: Diagnosis not present

## 2020-07-16 MED ORDER — METFORMIN HCL 500 MG PO TABS: 500.0000 mg | ORAL_TABLET | Freq: Two times a day (BID) | ORAL | 0 refills | Status: DC

## 2020-07-16 NOTE — Progress Notes (Signed)
Chief Complaint:   OBESITY Melissa Cohen is here to discuss her progress with her obesity treatment plan along with follow-up of her obesity related diagnoses. Melissa Cohen is on the Category 2 Plan and states she is following her eating plan approximately 50% of the time. Melissa Cohen states she is not exercising due to having COVID.  Today's visit was #: 6 Starting weight: 196 lbs Starting date: 04/08/2020 Today's weight: 182 lbs Today's date: 07/16/2020 Total lbs lost to date: 14 lbs Total lbs lost since last in-office visit: 2 lbs  Interim History: Melissa Cohen says she had COVID.  She tested positive on July 24th, but her symptoms started 1-2 days prior.  She says she still has decreased energy levels and no taste or smell.  She is following with her PCP regarding her recent COVID.  She is able to eat all of the foods on the plan, but since she cannot taste, she never feels satisfied.  She says she picks a lot at night after dinner.  She can taste chocolate.  She has been eating cookies, chips, and chocolate icing.  She will be going on a cruise for a month. She is doing 4 cruises back to back.  She says she is going with a very health conscious friend.  Subjective:   1. Insulin resistance [place] has a diagnosis of insulin resistance based on her elevated fasting insulin level >5. She continues to work on diet and exercise to decrease her risk of diabetes.  She is tolerating her medications well.  She takes 1 with breakfast and 1/2 at dinner.  Lab Results  Component Value Date   INSULIN 19.6 04/08/2020   Lab Results  Component Value Date   HGBA1C 5.2 04/08/2020   2. Essential hypertension Review: taking medications as instructed, no medication side effects noted, no chest pain on exertion, no dyspnea on exertion, no swelling of ankles.  She is on HCTZ and is tolerating it well.  BP Readings from Last 3 Encounters:  07/16/20 130/82  06/09/20 119/84  05/26/20 115/78   Assessment/Plan:   1.  Insulin resistance Melissa Cohen will continue to work on weight loss, exercise, and decreasing simple carbohydrates to help decrease the risk of diabetes. Melissa Cohen agreed to follow-up with Melissa Cohen as directed to closely monitor her progress.  Will refill metformin, as per below.  Take 1 tablet at lunch and 1 at dinner.  - Refill metFORMIN (GLUCOPHAGE) 500 MG tablet; Take 1 tablet (500 mg total) by mouth 2 (two) times daily.  Dispense: 60 tablet; Refill: 0  2. Essential hypertension Melissa Cohen is working on healthy weight loss and exercise to improve blood pressure control. We will watch for signs of hypotension as she continues her lifestyle modifications.  Blood pressure is at goal today.  3. Class 1 obesity with serious comorbidity and body mass index (BMI) of 31.0 to 31.9 in adult, unspecified obesity type Melissa Cohen is currently in the action stage of change. As such, her goal is to continue with weight loss efforts. She has agreed to the Category 2 Plan.   Exercise goals: As is.  Behavioral modification strategies: increasing lean protein intake, decreasing simple carbohydrates, keeping healthy foods in the home, ways to avoid night time snacking and planning for success.  Melissa Cohen has agreed to follow-up with our clinic in 2 weeks. She was informed of the importance of frequent follow-up visits to maximize her success with intensive lifestyle modifications for her multiple health conditions.   Objective:   Blood pressure  130/82, pulse 87, temperature 98.3 F (36.8 C), height 5' 4"  (1.626 m), weight 182 lb (82.6 kg), last menstrual period 09/02/2013, SpO2 100 %. Body mass index is 31.24 kg/m.  General: Cooperative, alert, well developed, in no acute distress. HEENT: Conjunctivae and lids unremarkable. Cardiovascular: Regular rhythm.  Lungs: Normal work of breathing. Neurologic: No focal deficits.   Lab Results  Component Value Date   CREATININE 0.83 04/08/2020   BUN 16 04/08/2020   NA 141 04/08/2020     K 4.0 04/08/2020   CL 102 04/08/2020   CO2 24 04/08/2020   Lab Results  Component Value Date   ALT 35 (H) 04/08/2020   AST 23 04/08/2020   ALKPHOS 77 04/08/2020   BILITOT 0.4 04/08/2020   Lab Results  Component Value Date   HGBA1C 5.2 04/08/2020   Lab Results  Component Value Date   INSULIN 19.6 04/08/2020   Lab Results  Component Value Date   TSH 1.460 04/08/2020   Lab Results  Component Value Date   CHOL 227 (H) 04/08/2020   HDL 56 04/08/2020   LDLCALC 139 (H) 04/08/2020   LDLDIRECT 104.1 12/07/2011   TRIG 179 (H) 04/08/2020   CHOLHDL 4.3 03/14/2020   Lab Results  Component Value Date   WBC 7.2 04/08/2020   HGB 15.2 04/08/2020   HCT 45.3 04/08/2020   MCV 90 04/08/2020   PLT 288 04/08/2020   Lab Results  Component Value Date   IRON 44 09/07/2016   TIBC 452 (H) 09/07/2016   FERRITIN 10 (L) 09/07/2016   Attestation Statements:   Reviewed by clinician on day of visit: allergies, medications, problem list, medical history, surgical history, family history, social history, and previous encounter notes.  Time spent on visit including pre-visit chart review and post-visit care and charting was 26 minutes.   I, Water quality scientist, CMA, am acting as Location manager for [employer], DO.  I have reviewed the above documentation for acracy and completeness, and I agree with the above. Mellody Dance, DO

## 2020-07-17 ENCOUNTER — Other Ambulatory Visit: Payer: Self-pay | Admitting: Family Medicine

## 2020-07-17 NOTE — Telephone Encounter (Signed)
Last cmp was 04/08/20 and patient's potassium level was at 4.0 Please advise if refill needed for potassium.

## 2020-08-19 ENCOUNTER — Encounter: Payer: Self-pay | Admitting: Family Medicine

## 2020-08-20 MED ORDER — CYCLOBENZAPRINE HCL 10 MG PO TABS: 10.0000 mg | ORAL_TABLET | Freq: Three times a day (TID) | ORAL | 0 refills | Status: DC | PRN

## 2020-09-03 ENCOUNTER — Encounter (INDEPENDENT_AMBULATORY_CARE_PROVIDER_SITE_OTHER): Payer: Self-pay | Admitting: Family Medicine

## 2020-09-03 ENCOUNTER — Other Ambulatory Visit: Payer: Self-pay

## 2020-09-03 ENCOUNTER — Ambulatory Visit (INDEPENDENT_AMBULATORY_CARE_PROVIDER_SITE_OTHER): Payer: BC Managed Care – PPO | Admitting: Family Medicine

## 2020-09-03 VITALS — BP 110/75 | HR 93 | Temp 98.7°F | Ht 64.0 in | Wt 192.0 lb

## 2020-09-03 DIAGNOSIS — Z9189 Other specified personal risk factors, not elsewhere classified: Secondary | ICD-10-CM

## 2020-09-03 DIAGNOSIS — Z6833 Body mass index (BMI) 33.0-33.9, adult: Secondary | ICD-10-CM

## 2020-09-03 DIAGNOSIS — E8881 Metabolic syndrome: Secondary | ICD-10-CM | POA: Diagnosis not present

## 2020-09-03 DIAGNOSIS — E669 Obesity, unspecified: Secondary | ICD-10-CM

## 2020-09-03 DIAGNOSIS — I10 Essential (primary) hypertension: Secondary | ICD-10-CM | POA: Diagnosis not present

## 2020-09-03 MED ORDER — METFORMIN HCL 500 MG PO TABS: 500.0000 mg | ORAL_TABLET | Freq: Two times a day (BID) | ORAL | 0 refills | Status: DC

## 2020-09-04 NOTE — Progress Notes (Signed)
Chief Complaint:   OBESITY Melissa Cohen is here to discuss her progress with her obesity treatment plan along with follow-up of her obesity related diagnoses. Melissa Cohen is on the Category 2 Plan and states she is following her eating plan approximately 50% of the time. Melissa Cohen states she is walking for 12,000 steps 7 days per week.  Today's visit was #: 7 Starting weight: 196 lbs Starting date: 04/08/2020 Today's weight: 192 lbs Today's date: 09/03/2020 Total lbs lost to date: 4 lbs Total lbs lost since last in-office visit: +10 lbs  Interim History:  - Melissa Cohen says she just got off 6 weeks of cruising.  She went from ship to ship- from one cruise line to another.  She just got back in town 30 minutes ago and wanted to get right back in with [place].    She tried to eat healthier choices and got more sleep per day, but of course, as per pt explains, there is a lot of indulgent activity on a cruise ship  Plan:  Get back at it and restart plan!    Assessment/Plan:    1. Insulin resistance She says she has not been taking any of her medications as prescribed while on the cruise for 6 weeks.     Plan:  Refill metformin.  Take as prescribed.  Get back on plan with decreasing simple carbs, inc fiber and losing wt.   -Refill metFORMIN (GLUCOPHAGE) 500 MG tablet; Take 1 tablet (500 mg total) by mouth 2 (two) times daily.  Dispense: 60 tablet; Refill: 0    2. Essential hypertension Medications: HCTZ 12.5 mg daily.   - Patient reports good compliance with medication and/or lifestyle modification  - Her denies acute concerns or problems related to treatment plan  - She denies new onset of: chest pain, exercise intolerance, shortness of breath, dizziness, etc  Today their BP is BP: 110/75  Last 3 blood pressure readings in our office are as follows: BP Readings from Last 3 Encounters:  09/03/20 110/75  07/16/20 130/82  06/09/20 119/84   Lab Results  Component Value Date    CREATININE 0.83 04/08/2020    PLAN:  - Blood pressure currently is at goal  - Patient will continue current treatment regimen  - Counseled patient on pathophysiology of disease and discussed various treatment options, which always includes dietary and lifestyle modification as first line.   - Lifestyle changes such as following our low salt, heart healthy meal plan and engaging in a regular exercise program discussed extensively with patient.   - Ambulatory blood pressure monitoring encouraged at least 2-3 times weekly.  Keep log and bring in every office visit.  Reminded patient that if they ever feel poorly in any way, to check their blood pressure and pulse as well.  - We will continue to monitor closely alongside PCP/ specialists.  Pt reminded to also f/up with those individuals as instructed by them.      3. At risk for diabetes mellitus - Melissa Cohen was given extensive diabetes prevention education and counseling today of more than 9 minutes.  - Counseled patient on pathophysiology of disease and discussed various treatment options which always includes dietary and lifestyle modification as first line.   - Importance of healthy diet with very limited amounts of simple carbohydrates discussed with patient in addition to regular aerobic exercise of 27mn 5d/week or more.     4. Class 1 obesity with serious comorbidity and body mass index (BMI) of  33.0 to 33.9 in adult, unspecified obesity type  Melissa Cohen is currently in the action stage of change. As such, her goal is to continue with weight loss efforts. She has agreed to the Category 2 Plan.   Exercise goals: For substantial health benefits, adults should do at least 150 minutes (2 hours and 30 minutes) a week of moderate-intensity, or 75 minutes (1 hour and 15 minutes) a week of vigorous-intensity aerobic physical activity, or an equivalent combination of moderate- and vigorous-intensity aerobic activity. Aerobic activity should be  performed in episodes of at least 10 minutes, and preferably, it should be spread throughout the week.  Behavioral modification strategies: meal planning and cooking strategies, keeping healthy foods in the home and planning for success.  Melissa Cohen has agreed to follow-up with our clinic in 2 weeks. She was informed of the importance of frequent follow-up visits to maximize her success with intensive lifestyle modifications for her multiple health conditions.   Objective:   Blood pressure 110/75, pulse 93, temperature 98.7 F (37.1 C), height 5' 4"  (1.626 m), weight 192 lb (87.1 kg), last menstrual period 09/02/2013, SpO2 95 %. Body mass index is 32.96 kg/m.  General: Cooperative, alert, well developed, in no acute distress. HEENT: Conjunctivae and lids unremarkable. Cardiovascular: Regular rhythm.  Lungs: Normal work of breathing. Neurologic: No focal deficits.   Lab Results  Component Value Date   CREATININE 0.83 04/08/2020   BUN 16 04/08/2020   NA 141 04/08/2020   K 4.0 04/08/2020   CL 102 04/08/2020   CO2 24 04/08/2020   Lab Results  Component Value Date   ALT 35 (H) 04/08/2020   AST 23 04/08/2020   ALKPHOS 77 04/08/2020   BILITOT 0.4 04/08/2020   Lab Results  Component Value Date   HGBA1C 5.2 04/08/2020   Lab Results  Component Value Date   INSULIN 19.6 04/08/2020   Lab Results  Component Value Date   TSH 1.460 04/08/2020   Lab Results  Component Value Date   CHOL 227 (H) 04/08/2020   HDL 56 04/08/2020   LDLCALC 139 (H) 04/08/2020   LDLDIRECT 104.1 12/07/2011   TRIG 179 (H) 04/08/2020   CHOLHDL 4.3 03/14/2020   Lab Results  Component Value Date   WBC 7.2 04/08/2020   HGB 15.2 04/08/2020   HCT 45.3 04/08/2020   MCV 90 04/08/2020   PLT 288 04/08/2020   Lab Results  Component Value Date   IRON 44 09/07/2016   TIBC 452 (H) 09/07/2016   FERRITIN 10 (L) 09/07/2016   Attestation Statements:   Reviewed by clinician on day of visit: allergies,  medications, problem list, medical history, surgical history, family history, social history, and previous encounter notes.  I, Water quality scientist, CMA, am acting as Location manager for [employer], DO.  I have reviewed the above documentation for accuracy and completeness, and I agree with the above. Mellody Dance, DO

## 2020-09-12 ENCOUNTER — Other Ambulatory Visit: Payer: Self-pay | Admitting: Family Medicine

## 2020-09-17 ENCOUNTER — Other Ambulatory Visit: Payer: Self-pay

## 2020-09-17 ENCOUNTER — Encounter (INDEPENDENT_AMBULATORY_CARE_PROVIDER_SITE_OTHER): Payer: Self-pay | Admitting: Family Medicine

## 2020-09-17 ENCOUNTER — Ambulatory Visit (INDEPENDENT_AMBULATORY_CARE_PROVIDER_SITE_OTHER): Payer: BC Managed Care – PPO | Admitting: Family Medicine

## 2020-09-17 VITALS — BP 110/78 | HR 90 | Temp 97.8°F | Ht 64.0 in | Wt 188.0 lb

## 2020-09-17 DIAGNOSIS — Z6832 Body mass index (BMI) 32.0-32.9, adult: Secondary | ICD-10-CM

## 2020-09-17 DIAGNOSIS — Z9189 Other specified personal risk factors, not elsewhere classified: Secondary | ICD-10-CM | POA: Diagnosis not present

## 2020-09-17 DIAGNOSIS — E669 Obesity, unspecified: Secondary | ICD-10-CM | POA: Diagnosis not present

## 2020-09-17 DIAGNOSIS — E8881 Metabolic syndrome: Secondary | ICD-10-CM | POA: Diagnosis not present

## 2020-09-17 MED ORDER — METFORMIN HCL 500 MG PO TABS: 500.0000 mg | ORAL_TABLET | Freq: Two times a day (BID) | ORAL | 0 refills | Status: DC

## 2020-09-19 ENCOUNTER — Other Ambulatory Visit: Payer: Self-pay

## 2020-09-19 ENCOUNTER — Encounter: Payer: Self-pay | Admitting: Family Medicine

## 2020-09-19 ENCOUNTER — Ambulatory Visit (INDEPENDENT_AMBULATORY_CARE_PROVIDER_SITE_OTHER): Payer: BC Managed Care – PPO | Admitting: Family Medicine

## 2020-09-19 ENCOUNTER — Encounter: Payer: BC Managed Care – PPO | Admitting: Family Medicine

## 2020-09-19 VITALS — BP 118/82 | HR 81 | Temp 97.1°F | Resp 15 | Ht 63.0 in | Wt 193.2 lb

## 2020-09-19 DIAGNOSIS — E559 Vitamin D deficiency, unspecified: Secondary | ICD-10-CM | POA: Diagnosis not present

## 2020-09-19 DIAGNOSIS — I10 Essential (primary) hypertension: Secondary | ICD-10-CM | POA: Diagnosis not present

## 2020-09-19 DIAGNOSIS — Z0000 Encounter for general adult medical examination without abnormal findings: Secondary | ICD-10-CM

## 2020-09-19 LAB — BASIC METABOLIC PANEL
BUN: 19 mg/dL (ref 6–23)
CO2: 30 mEq/L (ref 19–32)
Calcium: 9.5 mg/dL (ref 8.4–10.5)
Chloride: 103 mEq/L (ref 96–112)
Creatinine, Ser: 0.84 mg/dL (ref 0.40–1.20)
GFR: 77.79 mL/min (ref >60.00)
Glucose, Bld: 81 mg/dL (ref 70–99)
Potassium: 4 mEq/L (ref 3.5–5.1)
Sodium: 140 mEq/L (ref 135–145)

## 2020-09-19 LAB — LIPID PANEL
Cholesterol: 205 mg/dL — ABNORMAL HIGH (ref 0–200)
HDL: 62.7 mg/dL (ref >39.00)
LDL Cholesterol: 111 mg/dL — ABNORMAL HIGH (ref 0–99)
NonHDL: 142.43
Total CHOL/HDL Ratio: 3
Triglycerides: 158 mg/dL — ABNORMAL HIGH (ref 0.0–149.0)
VLDL: 31.6 mg/dL (ref 0.0–40.0)

## 2020-09-19 LAB — CBC WITH DIFFERENTIAL/PLATELET
Basophils Absolute: 0 10*3/uL (ref 0.0–0.1)
Basophils Relative: 0.5 % (ref 0.0–3.0)
Eosinophils Absolute: 0.1 10*3/uL (ref 0.0–0.7)
Eosinophils Relative: 1.7 % (ref 0.0–5.0)
HCT: 41.6 % (ref 36.0–46.0)
Hemoglobin: 14.3 g/dL (ref 12.0–15.0)
Lymphocytes Relative: 45.3 % (ref 12.0–46.0)
Lymphs Abs: 2.8 10*3/uL (ref 0.7–4.0)
MCHC: 34.4 g/dL (ref 30.0–36.0)
MCV: 89.1 fl (ref 78.0–100.0)
Monocytes Absolute: 0.4 10*3/uL (ref 0.1–1.0)
Monocytes Relative: 7.1 % (ref 3.0–12.0)
Neutro Abs: 2.8 10*3/uL (ref 1.4–7.7)
Neutrophils Relative %: 45.4 % (ref 43.0–77.0)
Platelets: 299 10*3/uL (ref 150.0–400.0)
RBC: 4.67 Mil/uL (ref 3.87–5.11)
RDW: 14.4 % (ref 11.5–15.5)
WBC: 6.2 10*3/uL (ref 4.0–10.5)

## 2020-09-19 LAB — HEPATIC FUNCTION PANEL
ALT: 27 U/L (ref 0–35)
AST: 22 U/L (ref 0–37)
Albumin: 4.4 g/dL (ref 3.5–5.2)
Alkaline Phosphatase: 67 U/L (ref 39–117)
Bilirubin, Direct: 0.1 mg/dL (ref 0.0–0.3)
Total Bilirubin: 0.5 mg/dL (ref 0.2–1.2)
Total Protein: 6.7 g/dL (ref 6.0–8.3)

## 2020-09-19 LAB — TSH: TSH: 1.53 u[IU]/mL (ref 0.35–4.50)

## 2020-09-19 LAB — VITAMIN D 25 HYDROXY (VIT D DEFICIENCY, FRACTURES): VITD: 57.72 ng/mL (ref 30.00–100.00)

## 2020-09-19 NOTE — Assessment & Plan Note (Signed)
Pt's PE WNL w/ exception of obesity.  UTD on GYN, colonoscopy, immunizations.  Check labs.  Anticipatory guidance provided.

## 2020-09-19 NOTE — Assessment & Plan Note (Signed)
Pt has hx of this.  Check labs and replete prn. 

## 2020-09-19 NOTE — Assessment & Plan Note (Signed)
Chronic problem.  Well controlled.  Currently asymptomatic.  Check labs.  Will follow.

## 2020-09-19 NOTE — Progress Notes (Signed)
   Subjective:    Patient ID: Melissa Cohen, female    DOB: 19-Aug-1966, 54 y.o.   MRN: [id number]  HPI CPE- UTD on pap, mammo, colonoscopy (due next month).  UTD on flu, COVID, Tdap  Reviewed past medical, surgical, family and social histories.   Health Maintenance  Topic Date Due  . HIV Screening  02/12/2021 (Originally 06/27/1979)  . COLONOSCOPY  10/06/2020  . MAMMOGRAM  07/26/2021  . PAP SMEAR-Modifier  07/26/2022  . TETANUS/TDAP  09/29/2023  . INFLUENZA VACCINE  Completed  . COVID-19 Vaccine  Completed  . Hepatitis C Screening  Completed    Patient Care Team    Relationship Specialty Notifications Start End  Midge Minium, MD PCP - General Family Medicine  02/25/20   Ladene Artist, MD Consulting Physician Gastroenterology  09/09/16       Review of Systems Patient reports no vision/ hearing changes, adenopathy,fever, weight change,  persistant/recurrent hoarseness , swallowing issues, chest pain, palpitations, edema, persistant/recurrent cough, hemoptysis, dyspnea (rest/exertional/paroxysmal nocturnal), gastrointestinal bleeding (melena, rectal bleeding), abdominal pain, significant heartburn, bowel changes, GU symptoms (dysuria, hematuria, incontinence), Gyn symptoms (abnormal  bleeding, pain),  syncope, focal weakness, numbness & tingling, skin/hair/nail changes, abnormal bruising or bleeding, anxiety, or depression.   + memory issues- pt has seen neuro and is supposed to schedule w/ Neuropsych  This visit occurred during the SARS-CoV-2 public health emergency.  Safety protocols were in place, including screening questions prior to the visit, additional usage of staff PPE, and extensive cleaning of exam room while observing appropriate contact time as indicated for disinfecting solutions.       Objective:   Physical Exam General Appearance:    Alert, cooperative, no distress, appears stated age  Head:    Normocephalic, without obvious abnormality, atraumatic    Eyes:    PERRL, conjunctiva/corneas clear, EOM's intact, fundi    benign, both eyes  Ears:    Normal TM's and external ear canals, both ears  Nose:   Deferred due to COVID  Throat:   Neck:   Supple, symmetrical, trachea midline, no adenopathy;    Thyroid: no enlargement/tenderness/nodules  Back:     Symmetric, no curvature, ROM normal, no CVA tenderness  Lungs:     Clear to auscultation bilaterally, respirations unlabored  Chest Wall:    No tenderness or deformity   Heart:    Regular rate and rhythm, S1 and S2 normal, no murmur, rub   or gallop  Breast Exam:    Deferred to mammo  Abdomen:     Soft, non-tender, bowel sounds active all four quadrants,    no masses, no organomegaly  Genitalia:    Deferred  Rectal:    Extremities:   Extremities normal, atraumatic, no cyanosis or edema  Pulses:   2+ and symmetric all extremities  Skin:   Skin color, texture, turgor normal, no rashes or lesions  Lymph nodes:   Cervical, supraclavicular, and axillary nodes normal  Neurologic:   CNII-XII intact, normal strength, sensation and reflexes    throughout          Assessment & Plan:

## 2020-09-19 NOTE — Patient Instructions (Signed)
Follow up in 6 months to recheck BP and cholesterol We'll notify you of your lab results and make any changes if needed Continue to work on healthy diet and regular exercise- you can do it! Call and schedule your neuropsych testing at your convenience Call with any questions or concerns Stay Safe!  Stay Healthy!

## 2020-09-22 ENCOUNTER — Encounter: Payer: Self-pay | Admitting: General Practice

## 2020-09-23 NOTE — Progress Notes (Addendum)
Chief Complaint:   OBESITY Melissa Cohen is here to discuss her progress with her obesity treatment plan along with follow-up of her obesity related diagnoses. Melissa Cohen is on the Category 2 Plan and states she is following her eating plan approximately 80% of the time. Melissa Cohen states she is walking for 20-60 minutes 7 times per week.  Today's visit was #: 8 Starting weight: 196 lbs Starting date: 04/08/2020 Today's weight: 188 lbs Today's date: 09/17/2020 Total lbs lost to date: 8 lbs Total lbs lost since last in-office visit: 4 lbs Total weight loss percentage to date: -4.08%   Interim History:  Melissa Cohen says she followed the plan 100% until this past Friday when she went to [place] and ate and drank while there.  She ate tomato bisque and beignet donuts and drank ciders.   When she ate on plan, hunger and cravings were well controlled.    She has upcoming travel again -another 3 weeks of cruising on various ships starting October 31st.    Assessment/Plan:   1. Insulin resistance Melissa Cohen is taking metformin every morning, but sometimes she forgets the evening dose.  She says it is helping some, but admits eating the "right foods" is most important.  Plan:  Will refill metformin today, as per below.  Continue prudent nutritional plan and weight loss.  Lab Results  Component Value Date   INSULIN 19.6 04/08/2020   Lab Results  Component Value Date   HGBA1C 5.2 04/08/2020   -Refill metFORMIN (GLUCOPHAGE) 500 MG tablet; Take 1 tablet (500 mg total) by mouth 2 (two) times daily.  Dispense: 60 tablet; Refill: 0   2. At risk for medication nonadherence Melissa Cohen was given approximately 10 minutes of counseling today to help her avoid medication non-adherence.  We discussed importance of taking medications at a similar time each day and the use of daily pill organizers to help improve medication adherence.   3. Class 1 obesity with serious comorbidity and body mass index (BMI) of 32.0 to  32.9 in adult, unspecified obesity type  Melissa Cohen is currently in the action stage of change. As such, her goal is to continue with weight loss efforts. She has agreed to the Category 2 Plan.   Exercise goals: As is.  Behavioral modification strategies: increasing lean protein intake, decreasing simple carbohydrates, no skipping meals, keeping healthy foods in the home, travel eating strategies and holiday eating strategies .   Melissa Cohen has agreed to follow-up with our clinic "when she is next available" due to being out of town so much with her frequent travel.   She was informed of the importance of frequent follow-up visits to maximize her success with intensive lifestyle modifications for her multiple health conditions.   Objective:   Blood pressure 110/78, pulse 90, temperature 97.8 F (36.6 C), height 5' 4"  (1.626 m), weight 188 lb (85.3 kg), last menstrual period 09/02/2013, SpO2 90 %. Body mass index is 32.27 kg/m.  General: Cooperative, alert, well developed, in no acute distress. HEENT: Conjunctivae and lids unremarkable. Cardiovascular: Regular rhythm.  Lungs: Normal work of breathing. Neurologic: No focal deficits.   Lab Results  Component Value Date   CREATININE 0.84 09/19/2020   BUN 19 09/19/2020   NA 140 09/19/2020   K 4.0 09/19/2020   CL 103 09/19/2020   CO2 30 09/19/2020   Lab Results  Component Value Date   ALT 27 09/19/2020   AST 22 09/19/2020   ALKPHOS 67 09/19/2020   BILITOT 0.5 09/19/2020  Lab Results  Component Value Date   HGBA1C 5.2 04/08/2020   Lab Results  Component Value Date   INSULIN 19.6 04/08/2020   Lab Results  Component Value Date   TSH 1.53 09/19/2020   Lab Results  Component Value Date   CHOL 205 (H) 09/19/2020   HDL 62.70 09/19/2020   LDLCALC 111 (H) 09/19/2020   LDLDIRECT 104.1 12/07/2011   TRIG 158.0 (H) 09/19/2020   CHOLHDL 3 09/19/2020   Lab Results  Component Value Date   WBC 6.2 09/19/2020   HGB 14.3 09/19/2020     HCT 41.6 09/19/2020   MCV 89.1 09/19/2020   PLT 299.0 09/19/2020   Lab Results  Component Value Date   IRON 44 09/07/2016   TIBC 452 (H) 09/07/2016   FERRITIN 10 (L) 09/07/2016   Attestation Statements:   Reviewed by clinician on day of visit: allergies, medications, problem list, medical history, surgical history, family history, social history, and previous encounter notes.  I, Water quality scientist, CMA, am acting as Location manager for [employer], DO.  I have reviewed the above documentation for accuracy and completeness, and I agree with the above. Marjory Sneddon, D.O.  The [place] was signed into law in 2016 which includes the topic of electronic health records.  This provides immediate access to information in MyChart.  This includes consultation notes, operative notes, office notes, lab results and pathology reports.  If you have any questions about what you read please let us know at your next visit so we can discuss your concerns and take corrective action if need be.  We are right here with you.

## 2020-10-20 ENCOUNTER — Other Ambulatory Visit: Payer: Self-pay

## 2020-10-20 ENCOUNTER — Other Ambulatory Visit: Payer: Self-pay | Admitting: Family Medicine

## 2020-10-20 ENCOUNTER — Ambulatory Visit (INDEPENDENT_AMBULATORY_CARE_PROVIDER_SITE_OTHER): Payer: BC Managed Care – PPO | Admitting: Family Medicine

## 2020-10-20 ENCOUNTER — Encounter (INDEPENDENT_AMBULATORY_CARE_PROVIDER_SITE_OTHER): Payer: Self-pay | Admitting: Family Medicine

## 2020-10-20 VITALS — BP 129/82 | HR 89 | Temp 98.1°F | Ht 64.0 in | Wt 188.0 lb

## 2020-10-20 DIAGNOSIS — Z9189 Other specified personal risk factors, not elsewhere classified: Secondary | ICD-10-CM | POA: Diagnosis not present

## 2020-10-20 DIAGNOSIS — E559 Vitamin D deficiency, unspecified: Secondary | ICD-10-CM | POA: Diagnosis not present

## 2020-10-20 DIAGNOSIS — E7849 Other hyperlipidemia: Secondary | ICD-10-CM | POA: Diagnosis not present

## 2020-10-20 DIAGNOSIS — R632 Polyphagia: Secondary | ICD-10-CM

## 2020-10-20 DIAGNOSIS — I10 Essential (primary) hypertension: Secondary | ICD-10-CM

## 2020-10-20 DIAGNOSIS — E8881 Metabolic syndrome: Secondary | ICD-10-CM | POA: Diagnosis not present

## 2020-10-20 DIAGNOSIS — E669 Obesity, unspecified: Secondary | ICD-10-CM

## 2020-10-20 DIAGNOSIS — Z6832 Body mass index (BMI) 32.0-32.9, adult: Secondary | ICD-10-CM

## 2020-10-20 MED ORDER — SAXENDA 18 MG/3ML ~~LOC~~ SOPN: 3.0000 mg | PEN_INJECTOR | Freq: Every day | SUBCUTANEOUS | 0 refills | Status: DC

## 2020-10-20 MED ORDER — INSULIN PEN NEEDLE 32G X 4 MM MISC: 1.0000 | Freq: Every day | 0 refills | Status: DC

## 2020-10-21 ENCOUNTER — Encounter (INDEPENDENT_AMBULATORY_CARE_PROVIDER_SITE_OTHER): Payer: Self-pay | Admitting: Family Medicine

## 2020-10-21 MED ORDER — METFORMIN HCL 500 MG PO TABS: 500.0000 mg | ORAL_TABLET | Freq: Two times a day (BID) | ORAL | 0 refills | Status: DC

## 2020-10-21 NOTE — Telephone Encounter (Signed)
LOV 09/19/20, Potassium was at 4.0

## 2020-10-22 NOTE — Progress Notes (Signed)
Chief Complaint:   OBESITY Melissa Cohen is here to discuss her progress with her obesity treatment plan along with follow-up of her obesity related diagnoses.   Today's visit was #: 9 Starting weight: 196 lbs Starting date: 04/08/2020 Today's weight: 188 lbs Today's date: 10/20/2020 Total lbs lost to date: 8 lbs Body mass index is 32.27 kg/m.  Total weight loss percentage to date: -4.08%  Interim History: Melissa Cohen says she has been drinking more water.  She is interested in advancing her medication.  She says that she has happy she has maintained her weight during her last trip.  She will be going to visit her sister, and then is going on another cruise prior to her next visit. Nutrition Plan: the Category 2 Plan.  Anti-obesity medications: Metformin. No side effects. Activity: Increased walking.  Assessment/Plan:   1. Essential hypertension At goal. Medications: HCTZ 12.5 mg daily.   Plan: Avoid buying foods that are: processed, frozen, or prepackaged to avoid excess salt. We will continue to monitor symptoms as they relate to her weight loss journey.  BP Readings from Last 3 Encounters:  10/20/20 129/82  09/19/20 118/82  09/17/20 110/78   Lab Results  Component Value Date   CREATININE 0.84 09/19/2020   2. Insulin resistance At goal. Goal is HgbA1c < 5.7, fasting insulin closer to 5.  Medication: metformin 500 mg twice daily.  She will continue to focus on protein-rich, low simple carbohydrate foods. We reviewed the importance of hydration, regular exercise for stress reduction, and restorative sleep.   Lab Results  Component Value Date   HGBA1C 5.2 04/08/2020   Lab Results  Component Value Date   INSULIN 19.6 04/08/2020   - Refill metFORMIN (GLUCOPHAGE) 500 MG tablet; Take 1 tablet (500 mg total) by mouth 2 (two) times daily.  Dispense: 60 tablet; Refill: 0  3. Vitamin D deficiency At goal. Current vitamin D is 57.72, tested on 09/19/2020.   Plan:  []   Continue  Vitamin D @50 ,000 IU every week. [x]   Continue home supplement daily. [x]   Follow-up for routine testing of Vitamin D at least 2-3 times per year to avoid over-replacement.  4. Other hyperlipidemia Lipid-lowering medications: Red yeast rice.   Plan: Dietary changes: Increase soluble fiber. Decrease simple carbohydrates. Exercise changes: An average 40 minutes of moderate to vigorous-intensity aerobic activity 3 or 4 times per week.   Lab Results  Component Value Date   CHOL 205 (H) 09/19/2020   HDL 62.70 09/19/2020   LDLCALC 111 (H) 09/19/2020   LDLDIRECT 104.1 12/07/2011   TRIG 158.0 (H) 09/19/2020   CHOLHDL 3 09/19/2020   Lab Results  Component Value Date   ALT 27 09/19/2020   AST 22 09/19/2020   ALKPHOS 67 09/19/2020   BILITOT 0.5 09/19/2020   The 10-year ASCVD risk score Mikey Bussing DC Jr., et al., 2013) is: 2.7%   Values used to calculate the score:     Age: 54 years     Sex: Female     Is Non-Hispanic African American: No     Diabetic: No     Tobacco smoker: No     Systolic Blood Pressure: 254 mmHg     Is BP treated: Yes     HDL Cholesterol: 62.7 mg/dL     Total Cholesterol: 205 mg/dL  5. Polyphagia Worsening. Plan:  Will start Saxenda 0.6 mg subcutaneously daily.  We have reviewed the risks and benefits of Saxenda. The patient denies a personal or family history  of medullary thyroid cancer or MENII. The patient denies a history of pancreatitis.  Alternative treatment options have been discussed. Patient understands that all anti-obesity medications are contraindicated in pregnancy. The potential risks and benefits of Saxenda were reviewed with the patient, and alternative treatment options were discussed. All questions were answered, and the patient wishes to move forward with this medication.  Please visit [email or web address] for information about this medication. Please visit [email or web address] to review how to administer Saxenda.   Start with escalation dose: ? Week 1:  0.6 mg [place] once daily x 1 week ? Week 2: 1.2 mg [place] once daily x 1 week ? Week 3: 1.8 mg [place] once daily x 1 week ? Week 4: 2.4 mg [place] once daily x 1 week ? Week 5 onward: 3 mg [place] once daily  6. At risk for nausea Melissa Cohen was given approximately 15 minutes of nausea prevention counseling today. Melissa Cohen is at risk for nausea due to her new or current medication. She was encouraged to titrate her medication slowly, make sure to stay hydrated, eat smaller portions throughout the day, and avoid high fat meals.   7. Class 1 obesity with serious comorbidity and body mass index (BMI) of 32.0 to 32.9 in adult, unspecified obesity type  - Start Liraglutide -Weight Management (SAXENDA) 18 MG/3ML SOPN; Inject 3 mg into the skin daily.  Dispense: 15 mL; Refill: 0 - Start Insulin Pen Needle 32G X 4 MM MISC; 1 each by Does not apply route daily.  Dispense: 100 each; Refill: 0  Course: Melissa Cohen is currently in the action stage of change. As such, her goal is to continue with weight loss efforts.  She has previously tried Qsymia, Contrave, and Belvique and only lost weight for the first 2 weeks.  Nutrition goals: She has agreed to keeping a food journal and adhering to recommended goals of 1000-1200 calories and 85 grams of protein.   Exercise goals: For substantial health benefits, adults should do at least 150 minutes (2 hours and 30 minutes) a week of moderate-intensity, or 75 minutes (1 hour and 15 minutes) a week of vigorous-intensity aerobic physical activity, or an equivalent combination of moderate- and vigorous-intensity aerobic activity. Aerobic activity should be performed in episodes of at least 10 minutes, and preferably, it should be spread throughout the week.  Behavioral modification strategies: increasing lean protein intake, decreasing simple carbohydrates, increasing vegetables, increasing water intake and travel eating strategies.  Melissa Cohen has agreed to follow-up with our clinic in 2-3  weeks. She was informed of the importance of frequent follow-up visits to maximize her success with intensive lifestyle modifications for her multiple health conditions.   Objective:   Blood pressure 129/82, pulse 89, temperature 98.1 F (36.7 C), temperature source Oral, height 5' 4"  (1.626 m), weight 188 lb (85.3 kg), last menstrual period 09/02/2013, SpO2 96 %. Body mass index is 32.27 kg/m.  General: Cooperative, alert, well developed, in no acute distress. HEENT: Conjunctivae and lids unremarkable. Cardiovascular: Regular rhythm.  Lungs: Normal work of breathing. Neurologic: No focal deficits.   Lab Results  Component Value Date   CREATININE 0.84 09/19/2020   BUN 19 09/19/2020   NA 140 09/19/2020   K 4.0 09/19/2020   CL 103 09/19/2020   CO2 30 09/19/2020   Lab Results  Component Value Date   ALT 27 09/19/2020   AST 22 09/19/2020   ALKPHOS 67 09/19/2020   BILITOT 0.5 09/19/2020   Lab Results  Component Value Date  HGBA1C 5.2 04/08/2020   Lab Results  Component Value Date   INSULIN 19.6 04/08/2020   Lab Results  Component Value Date   TSH 1.53 09/19/2020   Lab Results  Component Value Date   CHOL 205 (H) 09/19/2020   HDL 62.70 09/19/2020   LDLCALC 111 (H) 09/19/2020   LDLDIRECT 104.1 12/07/2011   TRIG 158.0 (H) 09/19/2020   CHOLHDL 3 09/19/2020   Lab Results  Component Value Date   WBC 6.2 09/19/2020   HGB 14.3 09/19/2020   HCT 41.6 09/19/2020   MCV 89.1 09/19/2020   PLT 299.0 09/19/2020   Lab Results  Component Value Date   IRON 44 09/07/2016   TIBC 452 (H) 09/07/2016   FERRITIN 10 (L) 09/07/2016   Attestation Statements:   Reviewed by clinician on day of visit: allergies, medications, problem list, medical history, surgical history, family history, social history, and previous encounter notes.  I, Water quality scientist, CMA, am acting as transcriptionist for Briscoe Deutscher, DO  I have reviewed the above documentation for accuracy and completeness,  and I agree with the above. Briscoe Deutscher, DO

## 2020-10-29 ENCOUNTER — Ambulatory Visit: Payer: BC Managed Care – PPO | Admitting: Adult Health

## 2020-11-03 ENCOUNTER — Encounter: Payer: Self-pay | Admitting: Family Medicine

## 2020-11-03 ENCOUNTER — Telehealth: Payer: Self-pay | Admitting: Family Medicine

## 2020-11-03 NOTE — Telephone Encounter (Signed)
I addressed this w/ pt via MyChart

## 2020-11-03 NOTE — Telephone Encounter (Signed)
Patient called in inquiring about her Wellbuturin that is now 372m . Pt states that it will run out early and does she need to go back to the 3025ma day or ok to stay on the 60055m day. Please advise

## 2020-11-03 NOTE — Telephone Encounter (Signed)
Pt called in stating that she was unaware that her wellbutrin script changed to 319m tabs once a day. She states for the last month she has been taking 2 300 mg tabs of the wellburtin. She wanted to make Dr. TBirdie Riddleaware of this because she is will run out of the script early. She also wanted to know if Dr. TBirdie Riddlewanted her to go back to 3040ma day or is it ok to stay on 60055mnce a day. She states that she is doing fine on this but wanted to know Dr. TabVirgil Benedictoughts on this.   Please advise

## 2020-11-04 ENCOUNTER — Encounter (INDEPENDENT_AMBULATORY_CARE_PROVIDER_SITE_OTHER): Payer: Self-pay | Admitting: Family Medicine

## 2020-11-04 ENCOUNTER — Other Ambulatory Visit: Payer: Self-pay

## 2020-11-04 ENCOUNTER — Ambulatory Visit (INDEPENDENT_AMBULATORY_CARE_PROVIDER_SITE_OTHER): Payer: BC Managed Care – PPO | Admitting: Family Medicine

## 2020-11-04 VITALS — BP 125/73 | HR 74 | Temp 97.9°F | Ht 64.0 in | Wt 184.0 lb

## 2020-11-04 DIAGNOSIS — E8881 Metabolic syndrome: Secondary | ICD-10-CM

## 2020-11-04 DIAGNOSIS — Z6831 Body mass index (BMI) 31.0-31.9, adult: Secondary | ICD-10-CM

## 2020-11-04 DIAGNOSIS — Z9189 Other specified personal risk factors, not elsewhere classified: Secondary | ICD-10-CM | POA: Diagnosis not present

## 2020-11-04 DIAGNOSIS — F39 Unspecified mood [affective] disorder: Secondary | ICD-10-CM | POA: Diagnosis not present

## 2020-11-04 DIAGNOSIS — I10 Essential (primary) hypertension: Secondary | ICD-10-CM

## 2020-11-04 DIAGNOSIS — R632 Polyphagia: Secondary | ICD-10-CM

## 2020-11-04 DIAGNOSIS — E669 Obesity, unspecified: Secondary | ICD-10-CM

## 2020-11-04 MED ORDER — SAXENDA 18 MG/3ML ~~LOC~~ SOPN: 3.0000 mg | PEN_INJECTOR | Freq: Every day | SUBCUTANEOUS | 0 refills | Status: DC

## 2020-11-04 MED ORDER — METFORMIN HCL 500 MG PO TABS: 500.0000 mg | ORAL_TABLET | Freq: Two times a day (BID) | ORAL | 0 refills | Status: DC

## 2020-11-04 NOTE — Telephone Encounter (Signed)
Noted  

## 2020-11-05 NOTE — Progress Notes (Signed)
Chief Complaint:   OBESITY Melissa Cohen is here to discuss her progress with her obesity treatment plan along with follow-up of her obesity related diagnoses. Melissa Cohen is on keeping a food journal and adhering to recommended goals of 1000-1200 calories and 85 grams protein and states she is following her eating plan approximately 25% of the time. Melissa Cohen states she is walking 12,000-13,000 steps daily 60 minutes 7 times per week.  Today's visit was #: 10 Starting weight: 196 lbs Starting date: 04/08/2020 Today's weight: 184 lbs Today's date: 11/04/2020 Total lbs lost to date: 12 lbs Total lbs lost since last in-office visit: 4 lbs Total weight loss percentage to date: -6.12%  Interim History: Melissa Cohen reports that she is focused on vegetables and protein for most meals and did PC/[place] but did not track. She was prescribed Saxenda by colleague on 10/24/2020. She is up to 1.1 mg daily and tolerating it well.   Plan: Refill Saxenda at 3 mg daily for 15 ml with no refills.  Assessment/Plan:   1. Insulin resistance Melissa Cohen is prescribed Metformin daily and tolerating it well without side effects.  Melissa Cohen has a diagnosis of insulin resistance based on her elevated fasting insulin level >5. She continues to work on diet and exercise to decrease her risk of diabetes.  Lab Results  Component Value Date   INSULIN 19.6 04/08/2020   Lab Results  Component Value Date   HGBA1C 5.2 04/08/2020   Plan: Continue Metformin. Moet denies need for refill but likely cannot come back until after the new year, so we will refill Metformin for 30 day supply. Good blood sugar control is important to decrease the likelihood of diabetic complications such as nephropathy, neuropathy, limb loss, blindness, coronary artery disease, and death. Intensive lifestyle modification including diet, exercise and weight loss are the first line of treatment for diabetes.   Refill- metFORMIN (GLUCOPHAGE) 500 MG tablet; Take 1 tablet  (500 mg total) by mouth 2 (two) times daily.  Dispense: 60 tablet; Refill: 0  2. Essential hypertension Melissa Cohen is prescribed HCTZ 12.5 mg. Review: taking medications as instructed, no medication side effects noted, no chest pain on exertion, no dyspnea on exertion, no swelling of ankles.   BP Readings from Last 3 Encounters:  11/04/20 125/73  10/20/20 129/82  09/19/20 118/82   Plan: Melissa Cohen's blood pressure is at goal today. Home blood pressure monitoring recommending, especially as she loses weight. Continue to decrease salt and start exercise. Melissa Cohen is working on healthy weight loss and exercise to improve blood pressure control. We will watch for signs of hypotension as she continues her lifestyle modifications.  3. Polyphagia Melissa Cohen is prescribed Saxenda, as of about 10 days ago, and tolerating it well. She denies side effects or concerns. She notes that it works well for appetite.   Plan: Refill Saxenda and increase dose to 3 mg daily as instructed (every 2 days or so if tolerated). Follow meal plan and increase exercise.    4. Mood disorder, with emotional eating Melissa Cohen is prescribed Wellbutrin daily and is tolerating it well. Emotional eating is under good control lately and especially so if she eats on the plan and gets proteins in.  Plan: Continue Wellbutrin 300 mg daily. We will continue to monitor emotional eating and mood, along with PCP.  5. At risk for activity intolerance Melissa Cohen's Vitamin D level was 57.72 on 09/19/2020. She is currently taking OTC vitamin D3 5,000 units each day. She denies nausea, vomiting or muscle weakness.  Plan: Continue current treatment plan. Low Vitamin D level contributes to fatigue and are associated with obesity, breast, and colon cancer. She agrees to continue to take prescription Vitamin D @50 ,000 IU every week and will follow-up for routine testing of Vitamin D, at least 2-3 times per year to avoid over-replacement.  6. Class 1 obesity with  serious comorbidity and body mass index (BMI) of 31.0 to 31.9 in adult, unspecified obesity type  Refill- Liraglutide -Weight Management (SAXENDA) 18 MG/3ML SOPN; Inject 3 mg into the skin daily.  Dispense: 15 mL; Refill: 0  Holli is currently in the action stage of change. As such, her goal is to continue with weight loss efforts. She has agreed to keeping a food journal and adhering to recommended goals of 1000-1200 calories and 85 grams protein.   Exercise goals: Advised formal walking regimen or exercise 20 minutes a day. All adults should avoid inactivity. Some physical activity is better than none, and adults who participate in any amount of physical activity gain some health benefits.  Behavioral modification strategies: increasing lean protein intake, decreasing simple carbohydrates, decreasing eating out, meal planning and cooking strategies, celebration eating strategies and planning for success.  Melissa Cohen has agreed to follow-up with our clinic in 2-4 weeks. She was informed of the importance of frequent follow-up visits to maximize her success with intensive lifestyle modifications for her multiple health conditions.   Objective:   Blood pressure 125/73, pulse 74, temperature 97.9 F (36.6 C), height 5' 4"  (1.626 m), weight 184 lb (83.5 kg), last menstrual period 09/02/2013, SpO2 100 %. Body mass index is 31.58 kg/m.  General: Cooperative, alert, well developed, in no acute distress. HEENT: Conjunctivae and lids unremarkable. Cardiovascular: Regular rhythm.  Lungs: Normal work of breathing. Neurologic: No focal deficits.   Lab Results  Component Value Date   CREATININE 0.84 09/19/2020   BUN 19 09/19/2020   NA 140 09/19/2020   K 4.0 09/19/2020   CL 103 09/19/2020   CO2 30 09/19/2020   Lab Results  Component Value Date   ALT 27 09/19/2020   AST 22 09/19/2020   ALKPHOS 67 09/19/2020   BILITOT 0.5 09/19/2020   Lab Results  Component Value Date   HGBA1C 5.2 04/08/2020    Lab Results  Component Value Date   INSULIN 19.6 04/08/2020   Lab Results  Component Value Date   TSH 1.53 09/19/2020   Lab Results  Component Value Date   CHOL 205 (H) 09/19/2020   HDL 62.70 09/19/2020   LDLCALC 111 (H) 09/19/2020   LDLDIRECT 104.1 12/07/2011   TRIG 158.0 (H) 09/19/2020   CHOLHDL 3 09/19/2020   Lab Results  Component Value Date   WBC 6.2 09/19/2020   HGB 14.3 09/19/2020   HCT 41.6 09/19/2020   MCV 89.1 09/19/2020   PLT 299.0 09/19/2020   Lab Results  Component Value Date   IRON 44 09/07/2016   TIBC 452 (H) 09/07/2016   FERRITIN 10 (L) 09/07/2016    Attestation Statements:   Reviewed by clinician on day of visit: allergies, medications, problem list, medical history, surgical history, family history, social history, and previous encounter notes.  Coral Ceo, am acting as Location manager for [employer], DO.  I have reviewed the above documentation for accuracy and completeness, and I agree with the above. Marjory Sneddon, D.O.  The [place] was signed into law in 2016 which includes the topic of electronic health records.  This provides immediate access  to information in [place].  This includes consultation notes, operative notes, office notes, lab results and pathology reports.  If you have any questions about what you read please let us know at your next visit so we can discuss your concerns and take corrective action if need be.  We are right here with you.

## 2020-11-06 ENCOUNTER — Encounter (INDEPENDENT_AMBULATORY_CARE_PROVIDER_SITE_OTHER): Payer: Self-pay | Admitting: Family Medicine

## 2020-11-06 NOTE — Telephone Encounter (Signed)
Dr Raliegh Scarlet, please advise

## 2020-11-18 ENCOUNTER — Other Ambulatory Visit: Payer: Self-pay

## 2020-11-18 ENCOUNTER — Ambulatory Visit (INDEPENDENT_AMBULATORY_CARE_PROVIDER_SITE_OTHER): Payer: BC Managed Care – PPO | Admitting: Bariatrics

## 2020-11-18 ENCOUNTER — Encounter (INDEPENDENT_AMBULATORY_CARE_PROVIDER_SITE_OTHER): Payer: Self-pay | Admitting: Bariatrics

## 2020-11-18 VITALS — BP 109/75 | HR 88 | Temp 97.7°F | Ht 64.0 in | Wt 184.0 lb

## 2020-11-18 DIAGNOSIS — Z6831 Body mass index (BMI) 31.0-31.9, adult: Secondary | ICD-10-CM | POA: Diagnosis not present

## 2020-11-18 DIAGNOSIS — I10 Essential (primary) hypertension: Secondary | ICD-10-CM

## 2020-11-18 DIAGNOSIS — E8881 Metabolic syndrome: Secondary | ICD-10-CM | POA: Diagnosis not present

## 2020-11-18 DIAGNOSIS — E669 Obesity, unspecified: Secondary | ICD-10-CM

## 2020-11-18 MED ORDER — METFORMIN HCL 500 MG PO TABS: 500.0000 mg | ORAL_TABLET | Freq: Every day | ORAL | 0 refills | Status: DC

## 2020-11-18 NOTE — Progress Notes (Signed)
Chief Complaint:   OBESITY Melissa Cohen is here to discuss her progress with her obesity treatment plan along with follow-up of her obesity related diagnoses. Melissa Cohen is on the Category 2 Plan and states she is following her eating plan approximately 80-90% of the time. Melissa Cohen states she is walking 9,000-10,000 steps daily.   Today's visit was #: 11 Starting weight: 196 lbs Starting date: 04/08/2020 Today's weight: 184 lbs Today's date: 11/18/2020 Total lbs lost to date: 12 Total lbs lost since last in-office visit: 0  Interim History: Melissa Cohen states that she is doing well. Her weight remains the same. She is taking Saxenda 1.2 mg and 5 clicks at 1.5 mg.  Subjective:   Essential hypertension. Melissa Cohen is taking HCTZ. Blood pressure is controlled.  BP Readings from Last 3 Encounters:  11/18/20 109/75  11/04/20 125/73  10/20/20 129/82   Lab Results  Component Value Date   CREATININE 0.84 09/19/2020   CREATININE 0.83 04/08/2020   CREATININE 0.95 03/14/2020   Insulin resistance. Melissa Cohen has a diagnosis of insulin resistance based on her elevated fasting insulin level >5. She continues to work on diet and exercise to decrease her risk of diabetes. Melissa Cohen is taking metformin and denies polyphagia.  Lab Results  Component Value Date   INSULIN 19.6 04/08/2020   Lab Results  Component Value Date   HGBA1C 5.2 04/08/2020   Assessment/Plan:   Essential hypertension. Mixtli is working on healthy weight loss and exercise to improve blood pressure control. We will watch for signs of hypotension as she continues her lifestyle modifications. She will continue her medication as directed.   Insulin resistance. Melissa Cohen will continue to work on weight loss, exercise, and decreasing simple carbohydrates to help decrease the risk of diabetes. Melissa Cohen agreed to follow-up with [place] as directed to closely monitor her progress. She will take metFORMIN (GLUCOPHAGE) 500 MG tablet 1 tablet with  breakfast daily #30 with 0 refills.  Class 1 obesity with serious comorbidity and body mass index (BMI) of 31.0 to 31.9 in adult, unspecified obesity type.  Melissa Cohen is currently in the action stage of change. As such, her goal is to continue with weight loss efforts. She has agreed to the Category 2 Plan.   She will work on meal planning and intentional eating.   Melissa Cohen will continue Saxenda 1.5 mg SQ at this time.  Exercise goals: All adults should avoid inactivity. Some physical activity is better than none, and adults who participate in any amount of physical activity gain some health benefits.  Behavioral modification strategies: increasing lean protein intake, decreasing simple carbohydrates, increasing vegetables, increasing water intake, decreasing eating out, no skipping meals, meal planning and cooking strategies, keeping healthy foods in the home, travel eating strategies, holiday eating strategies , celebration eating strategies, avoiding temptations and planning for success.  Melissa Cohen has agreed to follow-up with our clinic in 2-3 weeks. She was informed of the importance of frequent follow-up visits to maximize her success with intensive lifestyle modifications for her multiple health conditions.   Objective:   Blood pressure 109/75, pulse 88, temperature 97.7 F (36.5 C), temperature source Oral, height 5' 4"  (1.626 m), weight 184 lb (83.5 kg), last menstrual period 09/02/2013, SpO2 96 %. Body mass index is 31.58 kg/m.  General: Cooperative, alert, well developed, in no acute distress. HEENT: Conjunctivae and lids unremarkable. Cardiovascular: Regular rhythm.  Lungs: Normal work of breathing. Neurologic: No focal deficits.   Lab Results  Component Value Date   CREATININE  0.84 09/19/2020   BUN 19 09/19/2020   NA 140 09/19/2020   K 4.0 09/19/2020   CL 103 09/19/2020   CO2 30 09/19/2020   Lab Results  Component Value Date   ALT 27 09/19/2020   AST 22 09/19/2020    ALKPHOS 67 09/19/2020   BILITOT 0.5 09/19/2020   Lab Results  Component Value Date   HGBA1C 5.2 04/08/2020   Lab Results  Component Value Date   INSULIN 19.6 04/08/2020   Lab Results  Component Value Date   TSH 1.53 09/19/2020   Lab Results  Component Value Date   CHOL 205 (H) 09/19/2020   HDL 62.70 09/19/2020   LDLCALC 111 (H) 09/19/2020   LDLDIRECT 104.1 12/07/2011   TRIG 158.0 (H) 09/19/2020   CHOLHDL 3 09/19/2020   Lab Results  Component Value Date   WBC 6.2 09/19/2020   HGB 14.3 09/19/2020   HCT 41.6 09/19/2020   MCV 89.1 09/19/2020   PLT 299.0 09/19/2020   Lab Results  Component Value Date   IRON 44 09/07/2016   TIBC 452 (H) 09/07/2016   FERRITIN 10 (L) 09/07/2016   Attestation Statements:   Reviewed by clinician on day of visit: allergies, medications, problem list, medical history, surgical history, family history, social history, and previous encounter notes.  Time spent on visit including pre-visit chart review and post-visit charting and care was 20 minutes.   Migdalia Dk, am acting as Location manager for [employer], DO   I have reviewed the above documentation for accuracy and completeness, and I agree with the above. Jearld Lesch, DO

## 2020-11-19 ENCOUNTER — Encounter (INDEPENDENT_AMBULATORY_CARE_PROVIDER_SITE_OTHER): Payer: Self-pay | Admitting: Bariatrics

## 2020-11-21 ENCOUNTER — Encounter: Payer: Self-pay | Admitting: Family Medicine

## 2020-12-02 ENCOUNTER — Ambulatory Visit (INDEPENDENT_AMBULATORY_CARE_PROVIDER_SITE_OTHER): Payer: BC Managed Care – PPO | Admitting: Family Medicine

## 2020-12-09 ENCOUNTER — Ambulatory Visit (INDEPENDENT_AMBULATORY_CARE_PROVIDER_SITE_OTHER): Payer: 59 | Admitting: Family Medicine

## 2020-12-09 ENCOUNTER — Encounter (INDEPENDENT_AMBULATORY_CARE_PROVIDER_SITE_OTHER): Payer: Self-pay | Admitting: Family Medicine

## 2020-12-09 ENCOUNTER — Other Ambulatory Visit: Payer: Self-pay

## 2020-12-09 VITALS — BP 114/77 | HR 90 | Temp 97.9°F | Ht 64.0 in | Wt 179.0 lb

## 2020-12-09 DIAGNOSIS — E669 Obesity, unspecified: Secondary | ICD-10-CM

## 2020-12-09 DIAGNOSIS — Z6830 Body mass index (BMI) 30.0-30.9, adult: Secondary | ICD-10-CM | POA: Diagnosis not present

## 2020-12-09 DIAGNOSIS — E8881 Metabolic syndrome: Secondary | ICD-10-CM | POA: Diagnosis not present

## 2020-12-09 DIAGNOSIS — Z9189 Other specified personal risk factors, not elsewhere classified: Secondary | ICD-10-CM | POA: Insufficient documentation

## 2020-12-09 MED ORDER — RYBELSUS 7 MG PO TABS: ORAL_TABLET | ORAL | 0 refills | Status: DC

## 2020-12-11 ENCOUNTER — Other Ambulatory Visit: Payer: Self-pay

## 2020-12-11 ENCOUNTER — Ambulatory Visit (INDEPENDENT_AMBULATORY_CARE_PROVIDER_SITE_OTHER): Payer: 59 | Admitting: Otolaryngology

## 2020-12-11 VITALS — Temp 94.3°F

## 2020-12-11 DIAGNOSIS — R439 Unspecified disturbances of smell and taste: Secondary | ICD-10-CM | POA: Diagnosis not present

## 2020-12-11 DIAGNOSIS — R432 Parageusia: Secondary | ICD-10-CM | POA: Diagnosis not present

## 2020-12-11 NOTE — Progress Notes (Signed)
HPI: Melissa Cohen is a 55 y.o. female who presents for evaluation of odd taste and smell following COVID.  Apparently patient developed COVID last July and lost her sense of smell and taste.  Her roommate also lost her sense of smell and taste but recovered after a week or 2.  She had a booster shot in October and following the booster shot she developed a weird or abnormal smell and taste mostly taste.  Since October she has had chronic problems with abnormal taste of certain foods especially meats as well as coffee.  Some foods she can taste normal but others are bad.  She does not have any trouble breathing through her nose and no nasal sinus issues. She has tried retraining her smell as she has read some online about loss of smell and taste.  Past Medical History:  Diagnosis Date  . Allergy   . Anemia   . Anxiety   . Arthritis    Osteo arthritis - bil knees  . Asthma    as a young adult related to allergy flare  . Atypical nevi 08/25/2018   1. LEFT MID BACK (MILD) - NO TX, 2. RIGHT CHEST (SEVERE) - W/S  . Atypical nevi 09/14/2018   MID BACK (SEVERE) - W/S  . Atypical nevi 11/13/2018   LEFT POST. NECK INCIDENTRAL MOLE (MILD)  . Atypical nevi 11/27/2018   RIGHT CHEST + MARGIN  . Atypical nevus 01/02/2018   LEFT UPPER BACK (SEVERE) - W/S  . Atypical nevus 01/04/2019   LEFT STERNUM ( MODERATE)  . Atypical nevus 01/04/2019   LEFT POST NECK INF. (SEVERE)  . Atypical nevus 01/04/2019   RECURRENT MID BACK - [facility] DR. PAVLIS  . Back pain   . Basal cell carcinoma 06/06/2008   LEFT UPPER SHOULDER PROX. @ [facility]  . Blood transfusion without reported diagnosis    from [facility] several  . Chest pain   . Depression   . Fatty liver   . Gallbladder problem   . Gastric ulcer   . GERD (gastroesophageal reflux disease)   . Heart murmur   . Hyperlipidemia   . Hypertension   . Iron deficiency anemia   . Joint pain   . Knee pain   . Lower extremity edema   . Melanoma  ([place])    right cleavage, posterior left shoulder  . MM (malignant melanoma of skin) ([place]) 09/14/2018   LEFT POST. NECK MIS - EXC  . MM (malignant melanoma of skin) ([place]) 09/14/2018   RIGHT CHEST MIS - EXC  . Obesity   . Restless leg syndrome   . Seizures ([place])    2-3 d/t hypogylcermia - last seizure was 1998  . Shortness of breath on exertion   . Sleep apnea    sleep study showed mild sleep apnea - no c-papa needed  . Ulcerative colitis    Dx at age 5; had been 22 years since her last 68 and then had a flareup recently in summer of 2017. Was then started back on Remicade and is doing well since.  . Vitamin D deficiency    Past Surgical History:  Procedure Laterality Date  . APPENDECTOMY    . BREAST ENHANCEMENT SURGERY  2004  . CHOLECYSTECTOMY  2011  . COLONOSCOPY    . right eye lasix     02/17/12  . TONSILLECTOMY AND ADENOIDECTOMY    . TUBAL LIGATION    . UPPER GASTROINTESTINAL ENDOSCOPY    .  Uterine ablation    . WISDOM TOOTH EXTRACTION     Social History   Socioeconomic History  . Marital status: Significant Other    Spouse name: Not on file  . Number of children: 3  . Years of education: Not on file  . Highest education level: Not on file  Occupational History  . Occupation: Loss adjuster, chartered care provider    Employer: [employer]  Tobacco Use  . Smoking status: Former Smoker    Packs/day: 0.50    Years: 25.00    Pack years: 12.50    Types: Cigarettes    Quit date: 02/27/2006    Years since quitting: 14.7  . Smokeless tobacco: Never Used  Vaping Use  . Vaping Use: Never used  Substance and Sexual Activity  . Alcohol use: Yes    Alcohol/week: 3.0 standard drinks    Types: 3 Glasses of wine per week    Comment: every other weekend socially  . Drug use: No  . Sexual activity: Yes  Other Topics Concern  . Not on file  Social History Narrative   1-2 cups of tea a day.   Currently lives with her partner having. She divorced. She likes to walk daily doing exercise.  She now does pet care.   Social Determinants of Health   Financial Resource Strain: Not on file  Food Insecurity: Not on file  Transportation Needs: Not on file  Physical Activity: Not on file  Stress: Not on file  Social Connections: Not on file   Family History  Problem Relation Age of Onset  . Prostate cancer Father   . Hyperlipidemia Father   . Barrett's esophagus Father   . Cancer Father        prostate  . Colon cancer Father 46       appendical adenocarcinoma 2011  . Hypertension Father   . Diabetes Sister   . Diabetes Other        Grandparents  . Heart attack Maternal Grandfather   . Heart disease Maternal Grandfather   . Colitis Maternal Grandfather   . Ulcerative colitis Mother   . Hypertension Mother   . Hyperlipidemia Mother   . Depression Mother   . Ulcerative colitis Maternal Grandmother   . Crohn's disease Brother   . Esophageal cancer Neg Hx   . Rectal cancer Neg Hx   . Stomach cancer Neg Hx   . Allergic rhinitis Neg Hx   . Angioedema Neg Hx   . Asthma Neg Hx   . Atopy Neg Hx   . Eczema Neg Hx   . Immunodeficiency Neg Hx   . Colon polyps Neg Hx    Allergies  Allergen Reactions  . Tagamet [Cimetidine] Anaphylaxis    REACTION: anaphalactic shock  . Effexor [Venlafaxine]     Swelling   . Mobic [Meloxicam] Swelling  . Ultram [Tramadol] Swelling   Prior to Admission medications   Medication Sig Start Date End Date Taking? Authorizing Provider  acetaminophen (TYLENOL) 500 MG tablet Take 1,000 mg by mouth every 6 (six) hours as needed for headache.    [provider]  B Complex-C (SUPER B COMPLEX PO) Take 1 tablet by mouth every other day.    [provider]  buPROPion (WELLBUTRIN XL) 300 MG 24 hr tablet TAKE ONE TABLET BY MOUTH ONE TIME DAILY 10/21/20   Midge Minium, MD  celecoxib (CELEBREX) 200 MG capsule Take 1 capsule (200 mg total) by mouth 2 (two) times daily. Take one capsule  by mouth twice daily as needed 07/15/20    Midge Minium, MD  Chlorpheniramine Maleate (CHLOR-TRIMETON PO) Take 2 tablets by mouth daily as needed.     [provider]  Cholecalciferol (VITAMIN D3) 125 MCG (5000 UT) CAPS Take 1 capsule by mouth daily.    [provider]  cyclobenzaprine (FLEXERIL) 10 MG tablet Take 1 tablet (10 mg total) by mouth 3 (three) times daily as needed for muscle spasms. 08/20/20   Midge Minium, MD  esomeprazole (NEXIUM) 40 MG capsule Take 1 capsule (40 mg total) by mouth daily. 03/21/20   Ladene Artist, MD  famotidine (PEPCID) 40 MG tablet Take 1 tablet (40 mg total) by mouth daily. 03/21/20   Ladene Artist, MD  gabapentin (NEURONTIN) 300 MG capsule Take 300 mg by mouth 3 (three) times daily. 12/04/20   [provider]  hydrochlorothiazide (HYDRODIURIL) 12.5 MG tablet TAKE ONE TABLET BY MOUTH ONE TIME DAILY 10/21/20   Midge Minium, MD  ibuprofen (ADVIL) 600 MG tablet Take 600 mg by mouth every 6 (six) hours as needed.    [provider]  inFLIXimab 500 mg in sodium chloride 0.9 % 200 mL Inject 500 mg into the vein every 6 (six) weeks. 448 mg 07/18/17   Ladene Artist, MD  Insulin Pen Needle 32G X 4 MM MISC 1 each by Does not apply route daily. 10/20/20   Briscoe Deutscher, DO  metFORMIN (GLUCOPHAGE) 500 MG tablet Take 1 tablet (500 mg total) by mouth daily with breakfast. 11/18/20   Jearld Lesch A, DO  potassium chloride SA (KLOR-CON) 20 MEQ tablet TAKE ONE TABLET BY MOUTH ONE TIME DAILY 10/21/20   Midge Minium, MD  Probiotic Product (PROBIOTIC DAILY PO) Take 1 capsule by mouth daily.    [provider]  Red Yeast Rice Extract (RED YEAST RICE PO) Take 1,200 mg by mouth daily.    [provider]  Semaglutide (RYBELSUS) 7 MG TABS 1 po qd 12/09/20   Opalski, Neoma Laming, DO  Simethicone (GAS-X PO) Take 80 mg by mouth daily as needed.    [provider]     Positive ROS: Otherwise negative  All other systems have been reviewed and  were otherwise negative with the exception of those mentioned in the HPI and as above.  Physical Exam: Constitutional: Alert, well-appearing, no acute distress Ears: External ears without lesions or tenderness. Ear canals are clear bilaterally with intact, clear TMs.  Nasal: External nose without lesions. Septum is midline with clear nasal passages bilaterally.  Minimal mucosal edema.  Both middle meatus regions were clear bilaterally with no signs of infection.  No polyps or obstructive lesions noted within the nasal cavity. Oral: Lips and gums without lesions. Tongue and palate mucosa without lesions. Posterior oropharynx clear. Neck: No palpable adenopathy or masses Respiratory: Breathing comfortably  Skin: No facial/neck lesions or rash noted.  Procedures  Assessment: Abnormal taste and smell following COVID in July of last year.  Plan: I discussed with her concerning minimal treatment options at this time concerning long-term distorted taste and smell following COVID. Recommended use of Nasacort 2 sprays each nostril at night for the next month.  Radene Journey, MD

## 2020-12-11 NOTE — Progress Notes (Signed)
Chief Complaint:   OBESITY Melissa Cohen is here to discuss her progress with her obesity treatment plan along with follow-up of her obesity related diagnoses. Melissa Cohen is on the Category 2 Plan and states she is following her eating plan approximately 75% of the time. Melissa Cohen states she is walking 8,000-10,000 steps 45 minutes 7 times per week.  Today's visit was #: 12 Starting weight: 196 lbs Starting date: 04/08/2020 Today's weight: 179 lbs Today's date: 12/09/2020 Total lbs lost to date: 17 lbs Total lbs lost since last in-office visit: 5 lbs Total weight loss percentage to date: -8.67%  Interim History: Melissa Cohen hasn't been on cruise, thus she has been more on the plan than usual. However, her smell and taste have been completely off and she can only stand cheese as her protein. She is doing 3-4 protein shakes a day to make up for this.   Plan: Patina is encouraged to eat protein, rather than drink it.  Assessment/Plan:   Meds ordered this encounter  Medications  . Semaglutide (RYBELSUS) 7 MG TABS    Sig: 1 po qd    Dispense:  30 tablet    Refill:  0    1. Insulin resistance Melissa Cohen is on Metformin and usually takes 1 every morning and/or 1 in the evening. She is tolerating it well with no issues. She denies need for refill today.  Plan: Continue Metformin. Continue prudent nutritional plan. Continue weight loss and increase exercise as tolerated.   2. Morbid obesity (Melissa Cohen) Melissa Cohen increased Saxenda from 1.5 mg to 1.8 mg just a couple days ago. Works great for hunger and cravings. However, insurance only covers Victoza, Ozempic, Byetta, and Rybelsus per pt as she checked with her insurance and brought this list today to office.  Patient desires to change to an oral medication after our discussion about this class of medicines.  Plan:  - Discontinue Saxenda.    - Start Rybelsus 7 mg, as per below.  - Risks/benefits extensively discussed with patient prior to med change.  All  questions answered..  Start- Semaglutide (RYBELSUS) 7 MG TABS; 1 po qd  Dispense: 30 tablet; Refill: 0   3. At risk for impaired metabolic function Melissa Cohen was given approximately 15 minutes of impaired  metabolic function prevention counseling today. We discussed intensive lifestyle modifications today with an emphasis on specific nutrition and exercise instructions and strategies.    4. Class 1 obesity with serious comorbidity and body mass index (BMI) of 30.0 to 30.9 in adult, unspecified obesity type Melissa Cohen is currently in the action stage of change. As such, her goal is to continue with weight loss efforts. She has agreed to change to keeping a food journal and adhering to recommended goals of 1000 calories and 70-80 protein.   Exercise goals: As is  Behavioral modification strategies: meal planning and cooking strategies, keeping healthy foods in the home and planning for success.  Melissa Cohen has agreed to follow-up with our clinic in 2-4 weeks. She was informed of the importance of frequent follow-up visits to maximize her success with intensive lifestyle modifications for her multiple health conditions.   Objective:   Blood pressure 114/77, pulse 90, temperature 97.9 F (36.6 C), height 5' 4"  (1.626 m), weight 179 lb (81.2 kg), last menstrual period 09/02/2013, SpO2 98 %. Body mass index is 30.73 kg/m.  General: Cooperative, alert, well developed, in no acute distress. HEENT: Conjunctivae and lids unremarkable. Cardiovascular: Regular rhythm.  Lungs: Normal work of breathing. Neurologic: No focal  deficits.   Lab Results  Component Value Date   CREATININE 0.84 09/19/2020   BUN 19 09/19/2020   NA 140 09/19/2020   K 4.0 09/19/2020   CL 103 09/19/2020   CO2 30 09/19/2020   Lab Results  Component Value Date   ALT 27 09/19/2020   AST 22 09/19/2020   ALKPHOS 67 09/19/2020   BILITOT 0.5 09/19/2020   Lab Results  Component Value Date   HGBA1C 5.2 04/08/2020   Lab Results   Component Value Date   INSULIN 19.6 04/08/2020   Lab Results  Component Value Date   TSH 1.53 09/19/2020   Lab Results  Component Value Date   CHOL 205 (H) 09/19/2020   HDL 62.70 09/19/2020   LDLCALC 111 (H) 09/19/2020   LDLDIRECT 104.1 12/07/2011   TRIG 158.0 (H) 09/19/2020   CHOLHDL 3 09/19/2020   Lab Results  Component Value Date   WBC 6.2 09/19/2020   HGB 14.3 09/19/2020   HCT 41.6 09/19/2020   MCV 89.1 09/19/2020   PLT 299.0 09/19/2020   Lab Results  Component Value Date   IRON 44 09/07/2016   TIBC 452 (H) 09/07/2016   FERRITIN 10 (L) 09/07/2016    Attestation Statements:   Reviewed by clinician on day of visit: allergies, medications, problem list, medical history, surgical history, family history, social history, and previous encounter notes.  Coral Ceo, am acting as Location manager for [employer], DO.  I have reviewed the above documentation for accuracy and completeness, and I agree with the above. Marjory Sneddon, D.O.  The [place] was signed into law in 2016 which includes the topic of electronic health records.  This provides immediate access to information in MyChart.  This includes consultation notes, operative notes, office notes, lab results and pathology reports.  If you have any questions about what you read please let us know at your next visit so we can discuss your concerns and take corrective action if need be.  We are right here with you.

## 2020-12-12 ENCOUNTER — Other Ambulatory Visit: Payer: Self-pay

## 2020-12-19 ENCOUNTER — Encounter (INDEPENDENT_AMBULATORY_CARE_PROVIDER_SITE_OTHER): Payer: Self-pay | Admitting: Family Medicine

## 2020-12-20 ENCOUNTER — Other Ambulatory Visit: Payer: Self-pay | Admitting: Gastroenterology

## 2020-12-24 ENCOUNTER — Ambulatory Visit (INDEPENDENT_AMBULATORY_CARE_PROVIDER_SITE_OTHER): Payer: 59 | Admitting: Family Medicine

## 2021-01-06 ENCOUNTER — Ambulatory Visit (INDEPENDENT_AMBULATORY_CARE_PROVIDER_SITE_OTHER): Payer: BC Managed Care – PPO | Admitting: Family Medicine

## 2021-01-07 ENCOUNTER — Encounter: Payer: Self-pay | Admitting: Gastroenterology

## 2021-01-13 ENCOUNTER — Ambulatory Visit (INDEPENDENT_AMBULATORY_CARE_PROVIDER_SITE_OTHER): Payer: 59 | Admitting: Family Medicine

## 2021-01-13 ENCOUNTER — Other Ambulatory Visit: Payer: Self-pay

## 2021-01-13 ENCOUNTER — Encounter (INDEPENDENT_AMBULATORY_CARE_PROVIDER_SITE_OTHER): Payer: Self-pay | Admitting: Family Medicine

## 2021-01-13 VITALS — BP 116/80 | HR 76 | Temp 98.0°F | Ht 64.0 in | Wt 177.0 lb

## 2021-01-13 DIAGNOSIS — Z9189 Other specified personal risk factors, not elsewhere classified: Secondary | ICD-10-CM

## 2021-01-13 DIAGNOSIS — I10 Essential (primary) hypertension: Secondary | ICD-10-CM | POA: Diagnosis not present

## 2021-01-13 DIAGNOSIS — E669 Obesity, unspecified: Secondary | ICD-10-CM

## 2021-01-13 DIAGNOSIS — E7849 Other hyperlipidemia: Secondary | ICD-10-CM | POA: Diagnosis not present

## 2021-01-13 DIAGNOSIS — E8881 Metabolic syndrome: Secondary | ICD-10-CM

## 2021-01-13 DIAGNOSIS — Z6830 Body mass index (BMI) 30.0-30.9, adult: Secondary | ICD-10-CM

## 2021-01-14 MED ORDER — OZEMPIC (0.25 OR 0.5 MG/DOSE) 2 MG/1.5ML ~~LOC~~ SOPN: 0.5000 mg | PEN_INJECTOR | SUBCUTANEOUS | 0 refills | Status: DC

## 2021-01-14 NOTE — Telephone Encounter (Signed)
Last OV with Dr Wallace 

## 2021-01-19 NOTE — Progress Notes (Signed)
Chief Complaint:   OBESITY Melissa Cohen is here to discuss her progress with her obesity treatment plan along with follow-up of her obesity related diagnoses.   Today's visit was #: 13 Starting weight: 196 lbs Starting date: 04/08/2020 Today's weight: 177 lbs Today's date: 01/13/2021 Total lbs lost to date: 19 lbs Body mass index is 30.38 kg/m.  Total weight loss percentage to date: -9.69%  Interim History: Melissa Cohen says that her new insurance will not cover anti-obesity medications.  Melissa Cohen was helpful, she says.  She reports that Rybelsus is not helpful and is difficult to take. Nutrition Plan: keeping a food journal and adhering to recommended goals of 1000 calories and 70-80 protein for 80% of the time. Anti-obesity medications: Rybelsus 7 mg daily. Reported side effects: Difficult to take. Activity: Walking 8,000-10,000 steps per day 7 days per week.  Assessment/Plan:   1. Metabolic syndrome Starting goal: Lose 7-10% of starting weight. She will continue to focus on protein-rich, low simple carbohydrate foods. We reviewed the importance of hydration, regular exercise for stress reduction, and restorative sleep.  We will continue to check lab work every 3 months, with 10% weight loss, or should any other concerns arise.  Plan:  Start Ozempic, as per below.  - Start Semaglutide,0.25 or 0.5MG/DOS, (OZEMPIC, 0.25 OR 0.5 MG/DOSE,) 2 MG/1.5ML SOPN; Inject 0.5 mg into the skin once a week.  Dispense: 1.5 mL; Refill: 0  2. Insulin resistance Not at goal. Goal is HgbA1c < 5.7, fasting insulin closer to 5.  Medication: metformin 500 mg daily and Rybelsus 7 mg daily.    Plan:  She will continue to focus on protein-rich, low simple carbohydrate foods. We reviewed the importance of hydration, regular exercise for stress reduction, and restorative sleep.   Lab Results  Component Value Date   HGBA1C 5.2 04/08/2020   Lab Results  Component Value Date   INSULIN 19.6 04/08/2020   3.  Essential hypertension At goal. Medications: HCTZ 12.5 mg daily.   Plan: Avoid buying foods that are: processed, frozen, or prepackaged to avoid excess salt. We will watch for signs of hypotension as she continues lifestyle modifications. We will continue to monitor closely alongside her PCP and/or Specialist.  Regular follow up with PCP and specialists was also encouraged.   BP Readings from Last 3 Encounters:  01/13/21 116/80  12/09/20 114/77  11/18/20 109/75   Lab Results  Component Value Date   CREATININE 0.84 09/19/2020   4. Other hyperlipidemia Course: Not at goal. Lipid-lowering medications: red yeast rice.   Plan: Dietary changes: Increase soluble fiber, decrease simple carbohydrates, decrease saturated fat. Exercise changes: Moderate to vigorous-intensity aerobic activity 150 minutes per week or as tolerated. We will continue to monitor along with PCP/specialists as it pertains to her weight loss journey.  Lab Results  Component Value Date   CHOL 205 (H) 09/19/2020   HDL 62.70 09/19/2020   LDLCALC 111 (H) 09/19/2020   LDLDIRECT 104.1 12/07/2011   TRIG 158.0 (H) 09/19/2020   CHOLHDL 3 09/19/2020   Lab Results  Component Value Date   ALT 27 09/19/2020   AST 22 09/19/2020   ALKPHOS 67 09/19/2020   BILITOT 0.5 09/19/2020   The 10-year ASCVD risk score Mikey Bussing DC Jr., et al., 2013) is: 2.2%   Values used to calculate the score:     Age: 55 years     Sex: Female     Is Non-Hispanic African American: No     Diabetic: No  Tobacco smoker: No     Systolic Blood Pressure: 836 mmHg     Is BP treated: Yes     HDL Cholesterol: 62.7 mg/dL     Total Cholesterol: 205 mg/dL  5. At risk for nausea Melissa Cohen was given approximately 8 minutes of nausea prevention counseling today. Melissa Cohen is at risk for nausea due to her new or current medication. She was encouraged to titrate her medication slowly, make sure to stay hydrated, eat smaller portions throughout the day, and  avoid high fat meals.   6. Class 1 obesity with serious comorbidity and body mass index (BMI) of 30.0 to 30.9 in adult, unspecified obesity type  Course: Melissa Cohen is currently in the action stage of change. As such, her goal is to continue with weight loss efforts.   Nutrition goals: She has agreed to keeping a food journal and adhering to recommended goals of 1000 calories and 70-80 grams of protein.   Exercise goals: For substantial health benefits, adults should do at least 150 minutes (2 hours and 30 minutes) a week of moderate-intensity, or 75 minutes (1 hour and 15 minutes) a week of vigorous-intensity aerobic physical activity, or an equivalent combination of moderate- and vigorous-intensity aerobic activity. Aerobic activity should be performed in episodes of at least 10 minutes, and preferably, it should be spread throughout the week.  Behavioral modification strategies: increasing lean protein intake, decreasing simple carbohydrates, increasing vegetables and increasing water intake.  Melissa Cohen has agreed to follow-up with our clinic in 4 weeks. She was informed of the importance of frequent follow-up visits to maximize her success with intensive lifestyle modifications for her multiple health conditions.   Objective:   Blood pressure 116/80, pulse 76, temperature 98 F (36.7 C), temperature source Oral, height 5' 4"  (1.626 m), weight 177 lb (80.3 kg), last menstrual period 09/02/2013, SpO2 98 %. Body mass index is 30.38 kg/m.  General: Cooperative, alert, well developed, in no acute distress. HEENT: Conjunctivae and lids unremarkable. Cardiovascular: Regular rhythm.  Lungs: Normal work of breathing. Neurologic: No focal deficits.   Lab Results  Component Value Date   CREATININE 0.84 09/19/2020   BUN 19 09/19/2020   NA 140 09/19/2020   K 4.0 09/19/2020   CL 103 09/19/2020   CO2 30 09/19/2020   Lab Results  Component Value Date   ALT 27 09/19/2020   AST 22 09/19/2020    ALKPHOS 67 09/19/2020   BILITOT 0.5 09/19/2020   Lab Results  Component Value Date   HGBA1C 5.2 04/08/2020   Lab Results  Component Value Date   INSULIN 19.6 04/08/2020   Lab Results  Component Value Date   TSH 1.53 09/19/2020   Lab Results  Component Value Date   CHOL 205 (H) 09/19/2020   HDL 62.70 09/19/2020   LDLCALC 111 (H) 09/19/2020   LDLDIRECT 104.1 12/07/2011   TRIG 158.0 (H) 09/19/2020   CHOLHDL 3 09/19/2020   Lab Results  Component Value Date   WBC 6.2 09/19/2020   HGB 14.3 09/19/2020   HCT 41.6 09/19/2020   MCV 89.1 09/19/2020   PLT 299.0 09/19/2020   Lab Results  Component Value Date   IRON 44 09/07/2016   TIBC 452 (H) 09/07/2016   FERRITIN 10 (L) 09/07/2016   Attestation Statements:   Reviewed by clinician on day of visit: allergies, medications, problem list, medical history, surgical history, family history, social history, and previous encounter notes.  I, Water quality scientist, CMA, am acting as Location manager for [employer],  DO  I have reviewed the above documentation for accuracy and completeness, and I agree with the above. Briscoe Deutscher, DO

## 2021-01-30 ENCOUNTER — Encounter (INDEPENDENT_AMBULATORY_CARE_PROVIDER_SITE_OTHER): Payer: Self-pay | Admitting: Family Medicine

## 2021-02-01 ENCOUNTER — Other Ambulatory Visit: Payer: Self-pay | Admitting: Family Medicine

## 2021-02-01 DIAGNOSIS — F39 Unspecified mood [affective] disorder: Secondary | ICD-10-CM

## 2021-02-03 MED ORDER — OZEMPIC (1 MG/DOSE) 4 MG/3ML ~~LOC~~ SOPN
1.0000 mg | PEN_INJECTOR | SUBCUTANEOUS | 0 refills | Status: DC
Start: 2021-02-03 — End: 2021-02-05

## 2021-02-03 NOTE — Telephone Encounter (Signed)
Dr.Wallace °

## 2021-02-05 ENCOUNTER — Ambulatory Visit (INDEPENDENT_AMBULATORY_CARE_PROVIDER_SITE_OTHER): Payer: 59 | Admitting: Family Medicine

## 2021-02-05 ENCOUNTER — Other Ambulatory Visit: Payer: Self-pay

## 2021-02-05 ENCOUNTER — Encounter (INDEPENDENT_AMBULATORY_CARE_PROVIDER_SITE_OTHER): Payer: Self-pay | Admitting: Family Medicine

## 2021-02-05 VITALS — BP 100/62 | HR 78 | Temp 98.1°F | Ht 64.0 in | Wt 173.0 lb

## 2021-02-05 DIAGNOSIS — E8881 Metabolic syndrome: Secondary | ICD-10-CM | POA: Diagnosis not present

## 2021-02-05 DIAGNOSIS — K5100 Ulcerative (chronic) pancolitis without complications: Secondary | ICD-10-CM | POA: Diagnosis not present

## 2021-02-05 DIAGNOSIS — Z9189 Other specified personal risk factors, not elsewhere classified: Secondary | ICD-10-CM

## 2021-02-05 DIAGNOSIS — E669 Obesity, unspecified: Secondary | ICD-10-CM

## 2021-02-05 DIAGNOSIS — F39 Unspecified mood [affective] disorder: Secondary | ICD-10-CM | POA: Diagnosis not present

## 2021-02-05 DIAGNOSIS — Z6830 Body mass index (BMI) 30.0-30.9, adult: Secondary | ICD-10-CM

## 2021-02-05 DIAGNOSIS — E66811 Obesity, class 1: Secondary | ICD-10-CM

## 2021-02-05 MED ORDER — OZEMPIC (0.25 OR 0.5 MG/DOSE) 2 MG/1.5ML ~~LOC~~ SOPN: 0.5000 mg | PEN_INJECTOR | SUBCUTANEOUS | 0 refills | Status: DC

## 2021-02-08 DIAGNOSIS — E8881 Metabolic syndrome: Secondary | ICD-10-CM | POA: Insufficient documentation

## 2021-02-08 NOTE — Progress Notes (Signed)
Chief Complaint:   OBESITY Melissa Cohen is here to discuss her progress with her obesity treatment plan along with follow-up of her obesity related diagnoses.   Today's visit was #: 14 Starting weight: 196 lbs Starting date: 04/08/2020 Today's weight: 173 lbs Today's date: 02/05/2021 Total lbs lost to date: 23 lbs Body mass index is 29.7 kg/m.  Total weight loss percentage to date: -11.73%  Interim History:  Melissa Cohen is tolerating Ozempic well.  Still endorses polyphagia. Current Meal Plan: keeping a food journal and adhering to recommended goals of 1200 calories and 60 grams of protein for 100% of the time.  Current Exercise Plan: Walking for 45 minutes 4-5 times per week. Current Anti-Obesity Medications: Ozempic 0.5 mg subcutaneously weekly. Side effects: None.  Assessment/Plan:   1. Metabolic syndrome Improving with > 11% total weight loss since beginning with HWW.  She will continue to focus on protein-rich, low simple carbohydrate foods. We reviewed the importance of hydration, regular exercise for stress reduction, and restorative sleep.  We will continue to check lab work every 3 months, with 10% weight loss, or should any other concerns arise.  - Refill Semaglutide,0.25 or 0.5MG/DOS, (OZEMPIC, 0.25 OR 0.5 MG/DOSE,) 2 MG/1.5ML SOPN; Inject 0.5 mg into the skin once a week.  Dispense: 4.5 mL; Refill: 0  2. Ulcerative pancolitis without complication ([place]) Regana gets Remicade infusions every 6 weeks.  We will continue to monitor symptoms as they relate to her weight loss journey.  3. Mood disorder, with emotional eating Improving, but not optimized. Medication: Wellbutrin XL 300 mg daily.  Plan:  Continue Wellbutrin. Motivational interviewing as well as evidence-based interventions for health behavior change were utilized today including the discussion of self monitoring techniques, problem-solving barriers and SMART goal setting techniques.  Specifically regarding patient's less  desirable eating habits and patterns, we employed the technique of small changes when she cannot fully commit to her prudent nutritional plan.  4. At risk for complication associated with hypotension Melissa Cohen was given approximately 9 minutes of education and counseling today regarding the condition of hypotension, the pathophysiology of the condition and concerns regarding prevention and treatment of this condition.  We discussed risks of medications used to treat hypertension, which in the setting of weight loss, can inadvertently cause hypotension.  She understands importance of proper hydration and also of more prudent home BP monitoring while actively losing weight.     5. Class 1 obesity with serious comorbidity and body mass index (BMI) of 30.0 to 30.9 in adult, unspecified obesity type  Course: Melissa Cohen is currently in the action stage of change. As such, her goal is to continue with weight loss efforts.   Nutrition goals: She has agreed to keeping a food journal and adhering to recommended goals of 1200 calories and 60 grams of protein.   Exercise goals: For substantial health benefits, adults should do at least 150 minutes (2 hours and 30 minutes) a week of moderate-intensity, or 75 minutes (1 hour and 15 minutes) a week of vigorous-intensity aerobic physical activity, or an equivalent combination of moderate- and vigorous-intensity aerobic activity. Aerobic activity should be performed in episodes of at least 10 minutes, and preferably, it should be spread throughout the week.  Behavioral modification strategies: increasing lean protein intake and increasing water intake.  Melissa Cohen has agreed to follow-up with our clinic in 2-3 weeks. She was informed of the importance of frequent follow-up visits to maximize her success with intensive lifestyle modifications for her multiple health conditions.  Objective:   Blood pressure 100/62, pulse 78, temperature 98.1 F (36.7 C), temperature source  Oral, height 5' 4"  (1.626 m), weight 173 lb (78.5 kg), last menstrual period 09/02/2013, SpO2 98 %. Body mass index is 29.7 kg/m.  General: Cooperative, alert, well developed, in no acute distress. HEENT: Conjunctivae and lids unremarkable. Cardiovascular: Regular rhythm.  Lungs: Normal work of breathing. Neurologic: No focal deficits.   Lab Results  Component Value Date   CREATININE 0.84 09/19/2020   BUN 19 09/19/2020   NA 140 09/19/2020   K 4.0 09/19/2020   CL 103 09/19/2020   CO2 30 09/19/2020   Lab Results  Component Value Date   ALT 27 09/19/2020   AST 22 09/19/2020   ALKPHOS 67 09/19/2020   BILITOT 0.5 09/19/2020   Lab Results  Component Value Date   HGBA1C 5.2 04/08/2020   Lab Results  Component Value Date   INSULIN 19.6 04/08/2020   Lab Results  Component Value Date   TSH 1.53 09/19/2020   Lab Results  Component Value Date   CHOL 205 (H) 09/19/2020   HDL 62.70 09/19/2020   LDLCALC 111 (H) 09/19/2020   LDLDIRECT 104.1 12/07/2011   TRIG 158.0 (H) 09/19/2020   CHOLHDL 3 09/19/2020   Lab Results  Component Value Date   WBC 6.2 09/19/2020   HGB 14.3 09/19/2020   HCT 41.6 09/19/2020   MCV 89.1 09/19/2020   PLT 299.0 09/19/2020   Lab Results  Component Value Date   IRON 44 09/07/2016   TIBC 452 (H) 09/07/2016   FERRITIN 10 (L) 09/07/2016   Attestation Statements:   Reviewed by clinician on day of visit: allergies, medications, problem list, medical history, surgical history, family history, social history, and previous encounter notes.  I, Water quality scientist, CMA, am acting as transcriptionist for Briscoe Deutscher, DO  I have reviewed the above documentation for accuracy and completeness, and I agree with the above. Briscoe Deutscher, DO

## 2021-02-23 ENCOUNTER — Ambulatory Visit: Payer: BC Managed Care – PPO | Admitting: Adult Health

## 2021-03-03 ENCOUNTER — Other Ambulatory Visit: Payer: Self-pay | Admitting: Family Medicine

## 2021-03-04 ENCOUNTER — Encounter (INDEPENDENT_AMBULATORY_CARE_PROVIDER_SITE_OTHER): Payer: Self-pay | Admitting: Family Medicine

## 2021-03-04 ENCOUNTER — Other Ambulatory Visit: Payer: Self-pay

## 2021-03-04 ENCOUNTER — Ambulatory Visit (INDEPENDENT_AMBULATORY_CARE_PROVIDER_SITE_OTHER): Payer: 59 | Admitting: Family Medicine

## 2021-03-04 VITALS — BP 102/71 | HR 89 | Temp 98.3°F | Ht 64.0 in | Wt 170.0 lb

## 2021-03-04 DIAGNOSIS — Z6833 Body mass index (BMI) 33.0-33.9, adult: Secondary | ICD-10-CM | POA: Diagnosis not present

## 2021-03-04 DIAGNOSIS — Z9189 Other specified personal risk factors, not elsewhere classified: Secondary | ICD-10-CM

## 2021-03-04 DIAGNOSIS — E669 Obesity, unspecified: Secondary | ICD-10-CM

## 2021-03-04 DIAGNOSIS — R632 Polyphagia: Secondary | ICD-10-CM | POA: Diagnosis not present

## 2021-03-04 DIAGNOSIS — E8881 Metabolic syndrome: Secondary | ICD-10-CM | POA: Diagnosis not present

## 2021-03-04 MED ORDER — ONDANSETRON 4 MG PO TBDP: 4.0000 mg | ORAL_TABLET | Freq: Four times a day (QID) | ORAL | 0 refills | Status: DC | PRN

## 2021-03-05 ENCOUNTER — Ambulatory Visit (INDEPENDENT_AMBULATORY_CARE_PROVIDER_SITE_OTHER): Payer: 59 | Admitting: Adult Health

## 2021-03-10 MED ORDER — OZEMPIC (1 MG/DOSE) 4 MG/3ML ~~LOC~~ SOPN: 1.0000 mg | PEN_INJECTOR | SUBCUTANEOUS | 0 refills | Status: DC

## 2021-03-10 NOTE — Progress Notes (Signed)
Chief Complaint:   OBESITY Melissa Cohen is here to discuss her progress with her obesity treatment plan along with follow-up of her obesity related diagnoses.   Today's visit was #: 15 Starting weight: 196 lbs Starting date: 04/08/2020 Today's weight: 170 lbs Today's date: 03/04/2021 Total lbs lost to date: 26 lbs Body mass index is 29.18 kg/m.  Total weight loss percentage to date: -13.27%  Interim History: Melissa Cohen says she is going on another cruise next week. She would like to start 1 mg Ozempic after she gets back. She tolerated the 0.5 mg Ozempic with no side effects.  Current Meal Plan: keeping a food journal and adhering to recommended goals of 1200 calories and 60 grams of protein for 75% of the time.  Current Exercise Plan: Walking for 45 minutes 5 times per week. Current Anti-Obesity Medications: Ozempic 0.5 mg subcutaneously weekly. Side effects: none.  Assessment/Plan:   1. Metabolic syndrome Starting goal: Lose 7-10% of starting weight. She will continue to focus on protein-rich, low simple carbohydrate foods. We reviewed the importance of hydration, regular exercise for stress reduction, and restorative sleep.  We will continue to check lab work every 3 months, with 10% weight loss, or should any other concerns arise.  Plan: Will start Ozempic 1 mg subcutaneously weekly and Zofran 77m as needed.  - Start ondansetron (ZOFRAN ODT) 4 MG disintegrating tablet; Take 1 tablet (4 mg total) by mouth every 6 (six) hours as needed for nausea or vomiting.  Dispense: 20 tablet; Refill: 0  2. Polyphagia Not at goal. Polyphagia refers to excessive feelings of hunger. She will continue to focus on protein-rich, low simple carbohydrate foods. We reviewed the importance of hydration, regular exercise for stress reduction, and restorative sleep.  3. At risk for nausea Melissa RAYSONwas given approximately 8 minutes of nausea prevention counseling today. Melissa Cohen at risk for nausea  due to her new or current medication. She was encouraged to titrate her medication slowly, make sure to stay hydrated, eat smaller portions throughout the day, and avoid high fat meals.   4. Obesity, current BMI 29.3  Course: Melissa Cohen currently in the action stage of change. As such, her goal is to continue with weight loss efforts.   Nutrition goals: She has agreed to keeping a food journal and adhering to recommended goals of 1200 calories and 60 grams of protein.   Exercise goals: For substantial health benefits, adults should do at least 150 minutes (2 hours and 30 minutes) a week of moderate-intensity, or 75 minutes (1 hour and 15 minutes) a week of vigorous-intensity aerobic physical activity, or an equivalent combination of moderate- and vigorous-intensity aerobic activity. Aerobic activity should be performed in episodes of at least 10 minutes, and preferably, it should be spread throughout the week.  Behavioral modification strategies: increasing lean protein intake, decreasing simple carbohydrates, increasing vegetables and increasing water intake.  Melissa Cohen agreed to follow-up with our clinic in 4 weeks. She was informed of the importance of frequent follow-up visits to maximize her success with intensive lifestyle modifications for her multiple health conditions.   Objective:   Blood pressure 102/71, pulse 89, temperature 98.3 F (36.8 C), temperature source Oral, height 5' 4"  (1.626 m), weight 170 lb (77.1 kg), last menstrual period 09/02/2013, SpO2 98 %. Body mass index is 29.18 kg/m.  General: Cooperative, alert, well developed, in no acute distress. HEENT: Conjunctivae and lids unremarkable. Cardiovascular: Regular rhythm.  Lungs: Normal work of breathing. Neurologic: No  focal deficits.   Lab Results  Component Value Date   CREATININE 0.84 09/19/2020   BUN 19 09/19/2020   NA 140 09/19/2020   K 4.0 09/19/2020   CL 103 09/19/2020   CO2 30 09/19/2020   Lab Results   Component Value Date   ALT 27 09/19/2020   AST 22 09/19/2020   ALKPHOS 67 09/19/2020   BILITOT 0.5 09/19/2020   Lab Results  Component Value Date   HGBA1C 5.2 04/08/2020   Lab Results  Component Value Date   INSULIN 19.6 04/08/2020   Lab Results  Component Value Date   TSH 1.53 09/19/2020   Lab Results  Component Value Date   CHOL 205 (H) 09/19/2020   HDL 62.70 09/19/2020   LDLCALC 111 (H) 09/19/2020   LDLDIRECT 104.1 12/07/2011   TRIG 158.0 (H) 09/19/2020   CHOLHDL 3 09/19/2020   Lab Results  Component Value Date   WBC 6.2 09/19/2020   HGB 14.3 09/19/2020   HCT 41.6 09/19/2020   MCV 89.1 09/19/2020   PLT 299.0 09/19/2020   Lab Results  Component Value Date   IRON 44 09/07/2016   TIBC 452 (H) 09/07/2016   FERRITIN 10 (L) 09/07/2016   Attestation Statements:   Reviewed by clinician on day of visit: allergies, medications, problem list, medical history, surgical history, family history, social history, and previous encounter notes.  Leodis Binet Friedenbach, CMA, am acting as Location manager for [employer], DO.  I have reviewed the above documentation for accuracy and completeness, and I agree with the above. Briscoe Deutscher, DO

## 2021-03-18 LAB — HM MAMMOGRAPHY

## 2021-03-19 ENCOUNTER — Ambulatory Visit (INDEPENDENT_AMBULATORY_CARE_PROVIDER_SITE_OTHER): Payer: 59 | Admitting: Gastroenterology

## 2021-03-19 ENCOUNTER — Encounter: Payer: Self-pay | Admitting: Gastroenterology

## 2021-03-19 VITALS — HR 90 | Ht 64.0 in | Wt 174.0 lb

## 2021-03-19 DIAGNOSIS — K5100 Ulcerative (chronic) pancolitis without complications: Secondary | ICD-10-CM | POA: Diagnosis not present

## 2021-03-19 DIAGNOSIS — K219 Gastro-esophageal reflux disease without esophagitis: Secondary | ICD-10-CM

## 2021-03-19 MED ORDER — FAMOTIDINE 40 MG PO TABS: 40.0000 mg | ORAL_TABLET | Freq: Every day | ORAL | 3 refills | Status: DC

## 2021-03-19 MED ORDER — ESOMEPRAZOLE MAGNESIUM 40 MG PO CPDR: 40.0000 mg | DELAYED_RELEASE_CAPSULE | Freq: Every day | ORAL | 3 refills | Status: DC

## 2021-03-19 NOTE — Patient Instructions (Signed)
We have sent the following medications to your pharmacy for you to pick up at your convenience: Nexium and Pepcid.  It has been recommended to you by your physician that you have a(n) Colonoscopy completed. Per your request, we did not schedule the procedure(s) today. Please contact our office at [phone] should you decide to have the procedure completed. You will be scheduled for a pre-visit and procedure at that time.  We will contact you regarding changing your infusion center.  Thank you for choosing me and [facility].  Pricilla Riffle. Dagoberto Ligas., MD., [facility]

## 2021-03-19 NOTE — Progress Notes (Signed)
    History of Present Illness: This is a 55 year old female with ulcerative pancolitis.  Her last office visit was March 21, 2020.  She receives Remicade infusions at [facility] and has been doing well. No diarrhea, abdominal pain, hematochezia. She has achieved an intentional slow weight loss. Her last infusion was 4/5.  Current Medications, Allergies, Past Medical History, Past Surgical History, Family History and Social History were reviewed in [employer] record.    Physical Exam: General: Well developed, well nourished, no acute distress Head: Normocephalic and atraumatic Eyes: Sclerae anicteric, EOMI Ears: Normal auditory acuity Mouth: Not examined, mask on during Covid-19 pandemic Lungs: Clear throughout to auscultation Heart: Regular rate and rhythm; no murmurs, rubs or bruits Abdomen: Soft, non tender and non distended. No masses, hepatosplenomegaly or hernias noted. Normal Bowel sounds Rectal: Deferred to colonoscopy Musculoskeletal: Symmetrical with no gross deformities  Pulses:  Normal pulses noted Extremities: No clubbing, cyanosis, edema or deformities noted Neurological: Alert oriented x 4, grossly nonfocal Psychological:  Alert and cooperative. Normal mood and affect   Assessment and Recommendations:  1.  Ulcerative pancolitis well controlled on Remicade.  Continue Remicade infusions 67m/kg every 6 weeks. If insurance coverage allows [facility] She is overdue for surveillance colonoscopy. Schedule colonoscopy soon when she has care partner lined up. The risks (including bleeding, perforation, infection, missed lesions, medication reactions and possible hospitalization or surgery if complications occur), benefits, and alternatives to colonoscopy with possible biopsy and possible polypectomy were discussed with the patient and they consent to proceed.   2.  Family history of colon cancer, father.  Colonoscopy as above.   3.  GERD.  Continue Nexium 40 mg daily and Pepcid 40 mg daily prn.  Follow antireflux measures.  4.  Hepatic steatosis. Intermittently elevated ALT. Last LFTs in October 2021 were normal. Continue long term carb modified, fat modified, weight loss program under the care of Dr. BLeafy Ro  BMI has decreased to 29.87 from 34.74 over the past year.

## 2021-03-20 ENCOUNTER — Telehealth: Payer: Self-pay | Admitting: Pharmacy Technician

## 2021-03-20 ENCOUNTER — Other Ambulatory Visit: Payer: Self-pay | Admitting: Pharmacy Technician

## 2021-03-20 NOTE — Telephone Encounter (Signed)
Patient notified to keep her appointment with [facility] until approved to move to [facility] infusion and her insurance approved.Melissa Cohen

## 2021-03-20 NOTE — Telephone Encounter (Signed)
Auth Submission: Payer: [employer] Medication & CPT/J Code(s) submitted: Remicade (Infliximab) J1745 Route of submission (phone, fax, portal): FAX [phone] PHONE: [phone] Units/visits requested: 4   Will update once we receive a response.

## 2021-03-23 NOTE — Telephone Encounter (Signed)
Auth Submission: APPROVED  Payer: [employer] Medication & CPT/J Code(s) submitted: REMICADE (JCODE [id number]) Route of submission (phone, fax, portal): VIA FAX [phone] PHONE: [phone] Units/visits requested: 9 VISITS Reference number: [id number] Approval dates: 03/29/21 - 03/29/22 (buy/bill)

## 2021-03-26 ENCOUNTER — Encounter: Payer: Self-pay | Admitting: Gastroenterology

## 2021-04-01 DIAGNOSIS — M7502 Adhesive capsulitis of left shoulder: Secondary | ICD-10-CM | POA: Insufficient documentation

## 2021-04-02 ENCOUNTER — Ambulatory Visit (INDEPENDENT_AMBULATORY_CARE_PROVIDER_SITE_OTHER): Payer: 59 | Admitting: Family Medicine

## 2021-04-03 ENCOUNTER — Other Ambulatory Visit (HOSPITAL_COMMUNITY): Payer: Self-pay

## 2021-04-03 MED ORDER — GABAPENTIN 300 MG PO CAPS
ORAL_CAPSULE | ORAL | 3 refills | Status: DC
  Filled 2021-04-03: qty 90, 17d supply, fill #0

## 2021-04-06 ENCOUNTER — Other Ambulatory Visit (HOSPITAL_COMMUNITY): Payer: Self-pay

## 2021-04-06 ENCOUNTER — Encounter (INDEPENDENT_AMBULATORY_CARE_PROVIDER_SITE_OTHER): Payer: Self-pay | Admitting: Family Medicine

## 2021-04-06 NOTE — Telephone Encounter (Signed)
Pt last seen by Dr. Wallace.  

## 2021-04-08 ENCOUNTER — Encounter (INDEPENDENT_AMBULATORY_CARE_PROVIDER_SITE_OTHER): Payer: Self-pay | Admitting: Family Medicine

## 2021-04-08 ENCOUNTER — Other Ambulatory Visit: Payer: Self-pay

## 2021-04-08 ENCOUNTER — Ambulatory Visit (INDEPENDENT_AMBULATORY_CARE_PROVIDER_SITE_OTHER): Payer: 59 | Admitting: Family Medicine

## 2021-04-08 VITALS — BP 103/71 | HR 79 | Temp 98.2°F | Ht 64.0 in | Wt 165.0 lb

## 2021-04-08 DIAGNOSIS — Z6833 Body mass index (BMI) 33.0-33.9, adult: Secondary | ICD-10-CM

## 2021-04-08 DIAGNOSIS — I10 Essential (primary) hypertension: Secondary | ICD-10-CM

## 2021-04-08 DIAGNOSIS — F39 Unspecified mood [affective] disorder: Secondary | ICD-10-CM | POA: Diagnosis not present

## 2021-04-08 DIAGNOSIS — E669 Obesity, unspecified: Secondary | ICD-10-CM

## 2021-04-08 DIAGNOSIS — E8881 Metabolic syndrome: Secondary | ICD-10-CM

## 2021-04-08 DIAGNOSIS — Z9189 Other specified personal risk factors, not elsewhere classified: Secondary | ICD-10-CM

## 2021-04-08 MED ORDER — OZEMPIC (2 MG/DOSE) 8 MG/3ML ~~LOC~~ SOPN: 2.0000 mg | PEN_INJECTOR | SUBCUTANEOUS | 1 refills | Status: DC

## 2021-04-09 ENCOUNTER — Other Ambulatory Visit (HOSPITAL_COMMUNITY): Payer: Self-pay

## 2021-04-13 ENCOUNTER — Other Ambulatory Visit: Payer: Self-pay | Admitting: Family Medicine

## 2021-04-14 ENCOUNTER — Telehealth: Payer: Self-pay

## 2021-04-14 ENCOUNTER — Other Ambulatory Visit: Payer: Self-pay

## 2021-04-14 ENCOUNTER — Ambulatory Visit (INDEPENDENT_AMBULATORY_CARE_PROVIDER_SITE_OTHER): Payer: 59

## 2021-04-14 ENCOUNTER — Telehealth: Payer: Self-pay | Admitting: Gastroenterology

## 2021-04-14 VITALS — BP 130/81 | HR 74 | Temp 98.5°F | Resp 18 | Ht 64.0 in | Wt 170.0 lb

## 2021-04-14 DIAGNOSIS — K5100 Ulcerative (chronic) pancolitis without complications: Secondary | ICD-10-CM

## 2021-04-14 MED ORDER — DIPHENHYDRAMINE HCL 25 MG PO CAPS: 25.0000 mg | ORAL_CAPSULE | Freq: Once | ORAL | Status: DC

## 2021-04-14 MED ORDER — ACETAMINOPHEN 325 MG PO TABS: 650.0000 mg | ORAL_TABLET | Freq: Once | ORAL | Status: DC

## 2021-04-14 MED ORDER — HYDROCORTISONE NA SUCCINATE PF 100 MG IJ SOLR: 100.0000 mg | Freq: Once | INTRAMUSCULAR | Status: DC

## 2021-04-14 MED ORDER — SODIUM CHLORIDE 0.9 % IV SOLN
5.0000 mg/kg | Freq: Once | INTRAVENOUS | Status: AC
  Administered 2021-04-14: 400 mg via INTRAVENOUS
  Filled 2021-04-14: qty 40

## 2021-04-14 NOTE — Telephone Encounter (Signed)
Inbound call from patient. Wants to have a discussion about the infusion center. States it took them 2 hours to get the IV started and it is unacceptable. She wants a call at [phone] about referral patients there.

## 2021-04-14 NOTE — Telephone Encounter (Signed)
Spoke with Tresa Res from Pharmacy and explained delay and concerns of patient.  Also shared where patient offered to call before her appointment to confirm she was coming.  He stated that if she called 30 to 40 minutes prior then the medication prep could begin at that time. Information relayed to patient.

## 2021-04-14 NOTE — Telephone Encounter (Signed)
Called patient and discussed the process at the infusion center.  I advised her that I will forward on to providers here and we will communicate with the infusion center about concerns.   Patient notified that we have forwarded her concerns on to the pharmacist and lead physician at the site. Hopefully someone will reach out to her directly with her concerns.

## 2021-04-14 NOTE — Progress Notes (Signed)
Diagnosis: Ulcerative Colitis  Provider:  Marshell Garfinkel, MD  Procedure: Infusion  IV Type: Peripheral, IV Location: R Forearm  Remicade (Infliximab), Dose: 461m  Infusion Start Time: 13818 Infusion Stop Time: 12993 Post Infusion IV Care: Patient declined observation and Peripheral IV Discontinued. Pt upset due to longer waiting for medication receive from pharmacy. Pharmacy tech explained the process. Nurse manager SRichardson Landryspoke and explained the situation as well.  Discharge: Condition: Good, Destination: Home . AVS provided to patient.   Performed by:  SArnoldo Morale RN

## 2021-04-15 NOTE — Progress Notes (Signed)
Chief Complaint:   OBESITY Melissa Cohen is here to discuss her progress with her obesity treatment plan along with follow-up of her obesity related diagnoses.   Today's visit was #: 29 Starting weight: 196 lbs Starting date: 04/08/2020 Today's weight: 165 lbs Today's date: 04/08/2021 Weight change since last visit: 5 lbs Total lbs lost to date: 31 lbs Body mass index is 28.32 kg/m.  Total weight loss percentage to date: -15.82%  Interim History:  Melissa Cohen says she had a great time on her vacation cruise with her grandchildren. Current Meal Plan: keeping a food journal and adhering to recommended goals of 1200 calories and 60 grams of protein for 80% of the time.  Current Exercise Plan: Walking for 30-45 minutes 5-6 times per week. Current Anti-Obesity Medications: Ozempic 1 mg subcutaneously weekly. Side effects: None.  Assessment/Plan:   Meds ordered this encounter  Medications  . Semaglutide, 2 MG/DOSE, (OZEMPIC, 2 MG/DOSE,) 8 MG/3ML SOPN    Sig: Inject 2 mg into the skin once a week.    Dispense:  9 mL    Refill:  1    1. Metabolic syndrome Starting goal: Lose 7-10% of starting weight. She will continue to focus on protein-rich, low simple carbohydrate foods. We reviewed the importance of hydration, regular exercise for stress reduction, and restorative sleep.  We will continue to check lab work every 3 months, with 10% weight loss, or should any other concerns arise. Plan:  Increase Ozempic to 2 mg subcutaneously weekly as per below.  - Increase Semaglutide, 2 MG/DOSE, (OZEMPIC, 2 MG/DOSE,) 8 MG/3ML SOPN; Inject 2 mg into the skin once a week.  Dispense: 9 mL; Refill: 1  2. Essential hypertension, off medications At goal. Medications: None.  She has been off HCTZ for 1 week.  Plan: Avoid buying foods that are: processed, frozen, or prepackaged to avoid excess salt. We will watch for signs of hypotension as she continues lifestyle modifications.  BP Readings from Last 3  Encounters:  04/14/21 130/81  04/08/21 103/71  03/04/21 102/71   Lab Results  Component Value Date   CREATININE 0.84 09/19/2020   3. Mood disorder, with emotional eating Improving, but not optimized. Medication: Wellbutrin XL 300 mg daily.  Plan: Discussed cues and consequences, how thoughts affect eating, model of thoughts, feelings, and behaviors, and strategies for change by focusing on the cue. Discussed cognitive distortions, coping thoughts, and how to change your thoughts. Behavior modification techniques were discussed today to help deal with emotional/non-hunger eating behaviors.  4. At risk for nausea Melissa Cohen is at increased risk for nausea due to increasing her Ozempic dose.  5. Obesity, current BMI 28.5  Course: Melissa Cohen is currently in the action stage of change. As such, her goal is to continue with weight loss efforts.   Nutrition goals: She has agreed to keeping a food journal and adhering to recommended goals of 1200 calories and 60 grams of protein.   Exercise goals: For substantial health benefits, adults should do at least 150 minutes (2 hours and 30 minutes) a week of moderate-intensity, or 75 minutes (1 hour and 15 minutes) a week of vigorous-intensity aerobic physical activity, or an equivalent combination of moderate- and vigorous-intensity aerobic activity. Aerobic activity should be performed in episodes of at least 10 minutes, and preferably, it should be spread throughout the week.  Behavioral modification strategies: increasing lean protein intake, decreasing simple carbohydrates, increasing vegetables and increasing water intake.  Melissa Cohen has agreed to follow-up with our clinic in  4 weeks. She was informed of the importance of frequent follow-up visits to maximize her success with intensive lifestyle modifications for her multiple health conditions.   Objective:   Blood pressure 103/71, pulse 79, temperature 98.2 F (36.8 C), temperature source Oral, height 5'  4" (1.626 m), weight 165 lb (74.8 kg), last menstrual period 09/02/2013, SpO2 100 %. Body mass index is 28.32 kg/m.  General: Cooperative, alert, well developed, in no acute distress. HEENT: Conjunctivae and lids unremarkable. Cardiovascular: Regular rhythm.  Lungs: Normal work of breathing. Neurologic: No focal deficits.   Lab Results  Component Value Date   CREATININE 0.84 09/19/2020   BUN 19 09/19/2020   NA 140 09/19/2020   K 4.0 09/19/2020   CL 103 09/19/2020   CO2 30 09/19/2020   Lab Results  Component Value Date   ALT 27 09/19/2020   AST 22 09/19/2020   ALKPHOS 67 09/19/2020   BILITOT 0.5 09/19/2020   Lab Results  Component Value Date   HGBA1C 5.2 04/08/2020   Lab Results  Component Value Date   INSULIN 19.6 04/08/2020   Lab Results  Component Value Date   TSH 1.53 09/19/2020   Lab Results  Component Value Date   CHOL 205 (H) 09/19/2020   HDL 62.70 09/19/2020   LDLCALC 111 (H) 09/19/2020   LDLDIRECT 104.1 12/07/2011   TRIG 158.0 (H) 09/19/2020   CHOLHDL 3 09/19/2020   Lab Results  Component Value Date   WBC 6.2 09/19/2020   HGB 14.3 09/19/2020   HCT 41.6 09/19/2020   MCV 89.1 09/19/2020   PLT 299.0 09/19/2020   Lab Results  Component Value Date   IRON 44 09/07/2016   TIBC 452 (H) 09/07/2016   FERRITIN 10 (L) 09/07/2016    Attestation Statements:   Reviewed by clinician on day of visit: allergies, medications, problem list, medical history, surgical history, family history, social history, and previous encounter notes.  I, Water quality scientist, CMA, am acting as transcriptionist for Briscoe Deutscher, DO  I have reviewed the above documentation for accuracy and completeness, and I agree with the above. Briscoe Deutscher, DO

## 2021-04-19 ENCOUNTER — Other Ambulatory Visit (INDEPENDENT_AMBULATORY_CARE_PROVIDER_SITE_OTHER): Payer: Self-pay | Admitting: Family Medicine

## 2021-04-19 DIAGNOSIS — E8881 Metabolic syndrome: Secondary | ICD-10-CM

## 2021-04-20 NOTE — Telephone Encounter (Signed)
Pt last seen by Dr. Wallace.  

## 2021-04-22 ENCOUNTER — Other Ambulatory Visit: Payer: Self-pay

## 2021-04-22 ENCOUNTER — Ambulatory Visit (AMBULATORY_SURGERY_CENTER): Payer: 59

## 2021-04-22 VITALS — Ht 64.0 in | Wt 165.0 lb

## 2021-04-22 DIAGNOSIS — K5100 Ulcerative (chronic) pancolitis without complications: Secondary | ICD-10-CM

## 2021-04-22 MED ORDER — NA SULFATE-K SULFATE-MG SULF 17.5-3.13-1.6 GM/177ML PO SOLN: 1.0000 | Freq: Once | ORAL | 0 refills | Status: AC

## 2021-04-22 NOTE — Progress Notes (Signed)
Pt verified name, DOB, address and insurance during PV today.   Pt mailed instruction packet to include copy of consent form to read and not return, and instructions. PV completed over the phone. Pt encouraged to call with questions or issues.   No allergies to soy or egg Pt is not on blood thinners or diet pills Denies issues with sedation/intubation Denies atrial flutter/fib Denies constipation   Pt is aware of Covid safety and care partner requirements.

## 2021-05-13 ENCOUNTER — Other Ambulatory Visit (INDEPENDENT_AMBULATORY_CARE_PROVIDER_SITE_OTHER): Payer: Self-pay | Admitting: Family Medicine

## 2021-05-13 ENCOUNTER — Ambulatory Visit (INDEPENDENT_AMBULATORY_CARE_PROVIDER_SITE_OTHER): Payer: 59 | Admitting: Family Medicine

## 2021-05-13 DIAGNOSIS — E8881 Metabolic syndrome: Secondary | ICD-10-CM

## 2021-05-13 DIAGNOSIS — M545 Low back pain, unspecified: Secondary | ICD-10-CM | POA: Insufficient documentation

## 2021-05-13 NOTE — Telephone Encounter (Signed)
Last seen by Dr. Wallace. 

## 2021-05-14 ENCOUNTER — Other Ambulatory Visit: Payer: Self-pay

## 2021-05-14 ENCOUNTER — Ambulatory Visit (AMBULATORY_SURGERY_CENTER): Payer: 59 | Admitting: Gastroenterology

## 2021-05-14 ENCOUNTER — Encounter: Payer: Self-pay | Admitting: Gastroenterology

## 2021-05-14 VITALS — BP 103/76 | HR 75 | Temp 96.8°F | Resp 18 | Ht 64.0 in | Wt 165.0 lb

## 2021-05-14 DIAGNOSIS — K635 Polyp of colon: Secondary | ICD-10-CM | POA: Diagnosis not present

## 2021-05-14 DIAGNOSIS — K5100 Ulcerative (chronic) pancolitis without complications: Secondary | ICD-10-CM | POA: Diagnosis present

## 2021-05-14 DIAGNOSIS — D128 Benign neoplasm of rectum: Secondary | ICD-10-CM

## 2021-05-14 DIAGNOSIS — K5140 Inflammatory polyps of colon without complications: Secondary | ICD-10-CM

## 2021-05-14 DIAGNOSIS — K621 Rectal polyp: Secondary | ICD-10-CM

## 2021-05-14 DIAGNOSIS — K219 Gastro-esophageal reflux disease without esophagitis: Secondary | ICD-10-CM

## 2021-05-14 DIAGNOSIS — K5730 Diverticulosis of large intestine without perforation or abscess without bleeding: Secondary | ICD-10-CM

## 2021-05-14 DIAGNOSIS — D125 Benign neoplasm of sigmoid colon: Secondary | ICD-10-CM

## 2021-05-14 MED ORDER — SODIUM CHLORIDE 0.9 % IV SOLN: 500.0000 mL | Freq: Once | INTRAVENOUS | Status: DC

## 2021-05-14 NOTE — Op Note (Signed)
[facility] Patient Name: Melissa Cohen Procedure Date: 05/14/2021 2:28 PM MRN: [id number] Endoscopist: Ladene Artist , MD Age: 55 Referring MD:  Date of Birth: [birth year] Gender: Female Account #: [id number] Procedure:                Colonoscopy Indications:              High risk colon cancer surveillance: Ulcerative                            pancolitis of 8 (or more) years duration Medicines:                Monitored Anesthesia Care Procedure:                Pre-Anesthesia Assessment:                           - Prior to the procedure, a History and Physical                            was performed, and patient medications and                            allergies were reviewed. The patient's tolerance of                            previous anesthesia was also reviewed. The risks                            and benefits of the procedure and the sedation                            options and risks were discussed with the patient.                            All questions were answered, and informed consent                            was obtained. Prior Anticoagulants: The patient has                            taken no previous anticoagulant or antiplatelet                            agents. ASA Grade Assessment: III - A patient with                            severe systemic disease. After reviewing the risks                            and benefits, the patient was deemed in                            satisfactory condition to undergo the procedure.  After obtaining informed consent, the colonoscope                            was passed under direct vision. Throughout the                            procedure, the patient's blood pressure, pulse, and                            oxygen saturations were monitored continuously. The                            Olympus CF-HQ190 [id number]) Colonoscope was                            introduced through  the anus and advanced to the the                            terminal ileum, with identification of the                            appendiceal orifice and IC valve. The ileocecal                            valve, appendiceal orifice, and rectum were                            photographed. The quality of the bowel preparation                            was good. The colonoscopy was performed without                            difficulty. The patient tolerated the procedure                            well. Scope In: 2:51:21 PM Scope Out: 3:07:04 PM Scope Withdrawal Time: 0 hours 12 minutes 13 seconds  Total Procedure Duration: 0 hours 15 minutes 43 seconds  Findings:                 The perianal and digital rectal examinations were                            normal.                           The terminal ileum appeared normal.                           Diffuse mild inflammation characterized by erythema                            and scarring was found in the entire colon.  Biopsies were taken with a cold forceps for                            histology.                           Two sessile polyps were found in the rectum and                            sigmoid colon. The polyps were 6 to 7 mm in size.                            These polyps were removed with a cold snare.                            Resection and retrieval were complete.                           Multiple sessile and semi-pedunculated pseudopolyps                            were found in the rectum, sigmoid colon and                            descending colon. The polyps were 4 to 8 mm in                            size. Not biopsied or removed.                           External and internal hemorrhoids were found during                            retroflexion. The hemorrhoids were moderate and                            Grade I (internal hemorrhoids that do not prolapse).                            The exam was otherwise without abnormality on                            direct and retroflexion views. Complications:            No immediate complications. Estimated blood loss:                            None. Estimated Blood Loss:     Estimated blood loss: none. Impression:               - The examined portion of the ileum was normal.                           - Diffuse mild inflammation was found in the entire  examined colon secondary to quiescent ulcerative                            colitis. Biopsied.                           - Two 6 to 7 mm polyps in the rectum and in the                            sigmoid colon, removed with a cold snare. Resected                            and retrieved.                           - Multiple 4 to 8 mm polyps in the rectum, in the                            sigmoid colon and in the descending colon.                           - External and internal hemorrhoids.                           - The examination was otherwise normal on direct                            and retroflexion views. Recommendation:           - Repeat colonoscopy after studies are complete for                            surveillance based on pathology results.                           - Patient has a contact number available for                            emergencies. The signs and symptoms of potential                            delayed complications were discussed with the                            patient. Return to normal activities tomorrow.                            Written discharge instructions were provided to the                            patient.                           - Resume previous diet.                           -  Continue present medications.                           - Await pathology results. Ladene Artist, MD 05/14/2021 3:15:41 PM This report has been signed electronically.

## 2021-05-14 NOTE — Progress Notes (Signed)
Called to room to assist during endoscopic procedure.  Patient ID and intended procedure confirmed with present staff. Received instructions for my participation in the procedure from the performing physician.  

## 2021-05-14 NOTE — Patient Instructions (Signed)
Handouts provided on polyps and hemorrhoids.   YOU HAD AN ENDOSCOPIC PROCEDURE TODAY AT [facility]:   Refer to the procedure report that was given to you for any specific questions about what was found during the examination.  If the procedure report does not answer your questions, please call your gastroenterologist to clarify.  If you requested that your care partner not be given the details of your procedure findings, then the procedure report has been included in a sealed envelope for you to review at your convenience later.  YOU SHOULD EXPECT: Some feelings of bloating in the abdomen. Passage of more gas than usual.  Walking can help get rid of the air that was put into your GI tract during the procedure and reduce the bloating. If you had a lower endoscopy (such as a colonoscopy or flexible sigmoidoscopy) you may notice spotting of blood in your stool or on the toilet paper. If you underwent a bowel prep for your procedure, you may not have a normal bowel movement for a few days.  Please Note:  You might notice some irritation and congestion in your nose or some drainage.  This is from the oxygen used during your procedure.  There is no need for concern and it should clear up in a day or so.  SYMPTOMS TO REPORT IMMEDIATELY:  Following lower endoscopy (colonoscopy or flexible sigmoidoscopy):  Excessive amounts of blood in the stool  Significant tenderness or worsening of abdominal pains  Swelling of the abdomen that is new, acute  Fever of 100F or higher  For urgent or emergent issues, a gastroenterologist can be reached at any hour by calling [phone]. Do not use MyChart messaging for urgent concerns.    DIET:  We do recommend a small meal at first, but then you may proceed to your regular diet.  Drink plenty of fluids but you should avoid alcoholic beverages for 24 hours.  ACTIVITY:  You should plan to take it easy for the rest of today and you should NOT DRIVE  or use heavy machinery until tomorrow (because of the sedation medicines used during the test).    FOLLOW UP: Our staff will call the number listed on your records 48-72 hours following your procedure to check on you and address any questions or concerns that you may have regarding the information given to you following your procedure. If we do not reach you, we will leave a message.  We will attempt to reach you two times.  During this call, we will ask if you have developed any symptoms of COVID 19. If you develop any symptoms (ie: fever, flu-like symptoms, shortness of breath, cough etc.) before then, please call [phone].  If you test positive for Covid 19 in the 2 weeks post procedure, please call and report this information to [place].    If any biopsies were taken you will be contacted by phone or by letter within the next 1-3 weeks.  Please call us at [phone] if you have not heard about the biopsies in 3 weeks.    SIGNATURES/CONFIDENTIALITY: You and/or your care partner have signed paperwork which will be entered into your electronic medical record.  These signatures attest to the fact that that the information above on your After Visit Summary has been reviewed and is understood.  Full responsibility of the confidentiality of this discharge information lies with you and/or your care-partner.

## 2021-05-14 NOTE — Progress Notes (Signed)
Report to PACU, RN, vss, BBS= Clear.  

## 2021-05-18 ENCOUNTER — Telehealth: Payer: Self-pay | Admitting: *Deleted

## 2021-05-18 NOTE — Telephone Encounter (Signed)
  Follow up Call-  Call back number 05/14/2021 10/06/2018  Post procedure Call Back phone  # [phone] cell  Permission to leave phone message Yes Yes  Some recent data might be hidden     Patient questions:  Do you have a fever, pain , or abdominal swelling? No. Pain Score  0 *  Have you tolerated food without any problems? Yes.    Have you been able to return to your normal activities? Yes.    Do you have any questions about your discharge instructions: Diet   No. Medications  No. Follow up visit  No.  Do you have questions or concerns about your Care? No.  Actions: * If pain score is 4 or above: No action needed, pain <4.  Have you developed a fever since your procedure? no  2.   Have you had an respiratory symptoms (SOB or cough) since your procedure? no  3.   Have you tested positive for COVID 19 since your procedure no  4.   Have you had any family members/close contacts diagnosed with the COVID 19 since your procedure?  no   If yes to any of these questions please route to Joylene John, RN and Joella Prince, RN

## 2021-05-19 ENCOUNTER — Encounter (INDEPENDENT_AMBULATORY_CARE_PROVIDER_SITE_OTHER): Payer: Self-pay | Admitting: Family Medicine

## 2021-05-21 ENCOUNTER — Encounter: Payer: Self-pay | Admitting: Gastroenterology

## 2021-05-26 ENCOUNTER — Ambulatory Visit: Payer: 59

## 2021-05-27 ENCOUNTER — Encounter: Payer: Self-pay | Admitting: *Deleted

## 2021-06-05 ENCOUNTER — Ambulatory Visit (INDEPENDENT_AMBULATORY_CARE_PROVIDER_SITE_OTHER): Payer: 59

## 2021-06-05 ENCOUNTER — Other Ambulatory Visit: Payer: Self-pay

## 2021-06-05 VITALS — BP 114/77 | HR 74 | Temp 98.6°F | Resp 16

## 2021-06-05 DIAGNOSIS — K5100 Ulcerative (chronic) pancolitis without complications: Secondary | ICD-10-CM

## 2021-06-05 MED ORDER — INFLIXIMAB 100 MG IV SOLR
5.0000 mg/kg | Freq: Once | INTRAVENOUS | Status: AC
Start: 2021-06-05 — End: 2021-06-05
  Administered 2021-06-05: 400 mg via INTRAVENOUS
  Filled 2021-06-05 (×2): qty 40

## 2021-06-05 NOTE — Progress Notes (Signed)
Diagnosis: Ulcerative pancolitis  Provider:  Marshell Garfinkel, MD  Procedure: Infusion  IV Type: Peripheral, IV Location: L Antecubital  Remicade (Infliximab), Dose: 443m  Infusion Start Time: 06/05/21 1338  Infusion Stop Time: 06/05/21 1555  Post Infusion IV Care: Observation period completed and Peripheral IV Discontinued  Discharge: Condition: Good, Destination: Home . AVS provided to patient.   Performed by:  MJonelle Sidle RN

## 2021-06-16 ENCOUNTER — Other Ambulatory Visit: Payer: Self-pay

## 2021-06-16 ENCOUNTER — Encounter (INDEPENDENT_AMBULATORY_CARE_PROVIDER_SITE_OTHER): Payer: Self-pay | Admitting: Family Medicine

## 2021-06-16 ENCOUNTER — Ambulatory Visit (INDEPENDENT_AMBULATORY_CARE_PROVIDER_SITE_OTHER): Payer: 59 | Admitting: Family Medicine

## 2021-06-16 ENCOUNTER — Telehealth (INDEPENDENT_AMBULATORY_CARE_PROVIDER_SITE_OTHER): Payer: Self-pay

## 2021-06-16 VITALS — BP 107/71 | HR 82 | Temp 98.0°F | Ht 64.0 in | Wt 155.0 lb

## 2021-06-16 DIAGNOSIS — E669 Obesity, unspecified: Secondary | ICD-10-CM | POA: Diagnosis not present

## 2021-06-16 DIAGNOSIS — Z9189 Other specified personal risk factors, not elsewhere classified: Secondary | ICD-10-CM | POA: Diagnosis not present

## 2021-06-16 DIAGNOSIS — E8881 Metabolic syndrome: Secondary | ICD-10-CM | POA: Diagnosis not present

## 2021-06-16 DIAGNOSIS — F5081 Binge eating disorder: Secondary | ICD-10-CM

## 2021-06-16 DIAGNOSIS — K5100 Ulcerative (chronic) pancolitis without complications: Secondary | ICD-10-CM

## 2021-06-16 DIAGNOSIS — Z6833 Body mass index (BMI) 33.0-33.9, adult: Secondary | ICD-10-CM

## 2021-06-16 NOTE — Telephone Encounter (Signed)
Dr.Wallace °

## 2021-06-16 NOTE — Telephone Encounter (Signed)
Pt called in and stated that  she was seen today and that dr. Juleen China switched her medicine and that her pharmacy hasn't got the request. The pt is calling to see what the status of the prescription is. Please advise

## 2021-06-17 MED ORDER — LISDEXAMFETAMINE DIMESYLATE 20 MG PO CAPS: 20.0000 mg | ORAL_CAPSULE | Freq: Every day | ORAL | 0 refills | Status: DC

## 2021-06-17 NOTE — Telephone Encounter (Signed)
Replied to pts mychart msg

## 2021-06-18 ENCOUNTER — Telehealth: Payer: Self-pay | Admitting: Pharmacy Technician

## 2021-06-18 NOTE — Telephone Encounter (Signed)
Patient is enrolled in [facility]. [phone].  Had conference call with patient and Ranelle Oyster Rep to explain process.   Once patient has signed form, I will fax to [place].  Awaiting patient to come in and sign forms.

## 2021-06-21 NOTE — Progress Notes (Signed)
Chief Complaint:   OBESITY Melissa Cohen is here to discuss her progress with her obesity treatment plan along with follow-up of her obesity related diagnoses. See Medical Weight Management Flowsheet for complete bioelectrical impedance results.  Today's visit was #: 51 Starting weight: 196 lbs Starting date: 04/08/2020 Today's weight: 155 lbs Today's date: 06/16/2021 Weight change since last visit: 10 lbs Total lbs lost to date: 41 lbs Body mass index is 26.61 kg/m.  Total weight loss percentage to date: -20.92%  Interim History:   Melissa Cohen says she enjoyed 3 cruises since her last visti.  She endorses increased polyphagia and impulsive eating. Nutrition Plan: keeping a food journal and adhering to recommended goals of 1200 calories and 60 grams of protein. Activity:  Core strengthening for 15 minutes 2-3 times per week. Anti-obesity medications: Ozempic 2 mg subcutaneously weekly. Reported side effects: None.  Assessment/Plan:   1. Metabolic syndrome Starting goal: Lose 7-10% of starting weight. She will continue to focus on protein-rich, low simple carbohydrate foods. We reviewed the importance of hydration, regular exercise for stress reduction, and restorative sleep.  We will continue to check lab work every 3 months, with 10% weight loss, or should any other concerns arise.  2. Ulcerative pancolitis without complication ([place]) Melissa Cohen gets Remicade infusions every 6 weeks.  We will continue to monitor symptoms as they relate to her weight loss journey.  3. Binge eating disorder Melissa Cohen will start Vyvanse 20 mg daily, as per below.  People who binge eat feel as if they don't have control over how much they eat and have feelings of guilt or self-loathing after a binge eating episode. Melissa Cohen estimates that about 30 percent of adults with binge eating disorder also have a history of ADHD. The FDA has approved Vyvanse as a treatment option for both ADHD and binge eating. Vyvanse  targets the brain's reward center by increasing the levels of dopamine and norepinephrine, the chemicals of the brain responsible for feelings of pleasure. Mindful eating is the recommended nutritional approach to treating BED.   I have consulted the [place] Controlled Substances Registry for this patient, and feel the risk/benefit ratio today is favorable for proceeding with this prescription for a controlled substance. The patient understands monitoring parameters and red flags.   - Start lisdexamfetamine (VYVANSE) 20 MG capsule; Take 1 capsule (20 mg total) by mouth daily.  Dispense: 30 capsule; Refill: 0  4. At risk for adverse drug event Due to Melissa Cohen's current conditions and medications, she is at a higher risk for drug side effect.  At least 9 minutes was spent on counseling her about these concerns today.  We discussed the benefits and potential risks of these medications, and all of patient's concerns were addressed and questions were answered.     5. Obesity with current BMI of 26.7  Course: Melissa Cohen is currently in the action stage of change. As such, her goal is to continue with weight loss efforts.   Nutrition goals: She has agreed to keeping a food journal and adhering to recommended goals of 1200 calories and 60 grams of protein.   Exercise goals:  As is.  Behavioral modification strategies: increasing lean protein intake, decreasing simple carbohydrates, increasing vegetables, increasing water intake, and emotional eating strategies.  Melissa Cohen has agreed to follow-up with our clinic in 4 weeks. She was informed of the importance of frequent follow-up visits to maximize her success with intensive lifestyle modifications for her multiple health conditions.   Objective:  Blood pressure 107/71, pulse 82, temperature 98 F (36.7 C), temperature source Oral, height 5' 4"  (1.626 m), weight 155 lb (70.3 kg), last menstrual period 09/02/2013, SpO2 95 %. Body mass index is 26.61  kg/m.  General: Cooperative, alert, well developed, in no acute distress. HEENT: Conjunctivae and lids unremarkable. Cardiovascular: Regular rhythm.  Lungs: Normal work of breathing. Neurologic: No focal deficits.   Lab Results  Component Value Date   CREATININE 0.84 09/19/2020   BUN 19 09/19/2020   NA 140 09/19/2020   K 4.0 09/19/2020   CL 103 09/19/2020   CO2 30 09/19/2020   Lab Results  Component Value Date   ALT 27 09/19/2020   AST 22 09/19/2020   ALKPHOS 67 09/19/2020   BILITOT 0.5 09/19/2020   Lab Results  Component Value Date   HGBA1C 5.2 04/08/2020   Lab Results  Component Value Date   INSULIN 19.6 04/08/2020   Lab Results  Component Value Date   TSH 1.53 09/19/2020   Lab Results  Component Value Date   CHOL 205 (H) 09/19/2020   HDL 62.70 09/19/2020   LDLCALC 111 (H) 09/19/2020   LDLDIRECT 104.1 12/07/2011   TRIG 158.0 (H) 09/19/2020   CHOLHDL 3 09/19/2020   Lab Results  Component Value Date   VD25OH 57.72 09/19/2020   VD25OH 61.0 04/08/2020   VD25OH 41.54 07/31/2015   Lab Results  Component Value Date   WBC 6.2 09/19/2020   HGB 14.3 09/19/2020   HCT 41.6 09/19/2020   MCV 89.1 09/19/2020   PLT 299.0 09/19/2020   Lab Results  Component Value Date   IRON 44 09/07/2016   TIBC 452 (H) 09/07/2016   FERRITIN 10 (L) 09/07/2016   Attestation Statements:   Reviewed by clinician on day of visit: allergies, medications, problem list, medical history, surgical history, family history, social history, and previous encounter notes.  I, Water quality scientist, CMA, am acting as transcriptionist for Briscoe Deutscher, DO  I have reviewed the above documentation for accuracy and completeness, and I agree with the above. Briscoe Deutscher, DO

## 2021-07-06 ENCOUNTER — Other Ambulatory Visit (INDEPENDENT_AMBULATORY_CARE_PROVIDER_SITE_OTHER): Payer: Self-pay | Admitting: Family Medicine

## 2021-07-06 DIAGNOSIS — E8881 Metabolic syndrome: Secondary | ICD-10-CM

## 2021-07-06 NOTE — Telephone Encounter (Signed)
Last OV with Dr Wallace 

## 2021-07-07 ENCOUNTER — Ambulatory Visit: Payer: 59

## 2021-07-08 ENCOUNTER — Other Ambulatory Visit (INDEPENDENT_AMBULATORY_CARE_PROVIDER_SITE_OTHER): Payer: Self-pay | Admitting: Family Medicine

## 2021-07-08 DIAGNOSIS — E8881 Metabolic syndrome: Secondary | ICD-10-CM

## 2021-07-08 NOTE — Telephone Encounter (Signed)
Dr.Wallace °

## 2021-07-10 ENCOUNTER — Encounter (INDEPENDENT_AMBULATORY_CARE_PROVIDER_SITE_OTHER): Payer: Self-pay

## 2021-07-10 DIAGNOSIS — K5100 Ulcerative (chronic) pancolitis without complications: Secondary | ICD-10-CM

## 2021-07-10 NOTE — Telephone Encounter (Signed)
I spoke with the patient about her message.  She reports that the end of week 4 post infusion she starts to have diarrhea and some generalized abdominal pain. She is due for her infusion for Remicade next week.  She is interested in discussing alternative medications to Remicade , oral agents specifically. She is traveling a great deal and having to return to [facility] for her infusions.  She wants to explore increasing her dose to 10 mg/kg every 8 weeks (currently on 5 mg q 6 weeks) or possibly an oral or injectable alternative.  She will be returning to [place] on 9/10.  I have scheduled her for an office visit to discuss.  She is requesting labs to be drawn with her next infusion on 8/19 cmp, iron studies, quantiferon, lft, and sed rate.  Would you want to add a Infliximab antibody and level?  She is aware Dr. Fuller Plan is out of the office and she will call me back or message back next Wed is she has not heard about answers from labs.

## 2021-07-11 ENCOUNTER — Encounter: Payer: Self-pay | Admitting: Family Medicine

## 2021-07-13 NOTE — Telephone Encounter (Signed)
Pt called in checking on this, please advise

## 2021-07-14 ENCOUNTER — Other Ambulatory Visit: Payer: Self-pay

## 2021-07-14 ENCOUNTER — Other Ambulatory Visit: Payer: Self-pay | Admitting: Family

## 2021-07-14 MED ORDER — SCOPOLAMINE 1 MG/3DAYS TD PT72: 1.0000 | MEDICATED_PATCH | TRANSDERMAL | 12 refills | Status: DC

## 2021-07-14 NOTE — Telephone Encounter (Signed)
Melissa Artist, MD  You 23 hours ago (1:24 PM)   Please send CBC, CMP, HBsAg, Quantiferron gold, TSH, ESR, CRP, Fe, TIBC, ferritin.   Given colonoscopy findings and symptoms it's reasonable to change Remicade dosing to 10 mg/kg q8 weeks so if she agrees let's do that.     Patient contacted and agrees with plans.  She is aware that pre-cert may not have time to be authorized for this infusion on Friday.

## 2021-07-16 ENCOUNTER — Encounter (INDEPENDENT_AMBULATORY_CARE_PROVIDER_SITE_OTHER): Payer: Self-pay | Admitting: Family Medicine

## 2021-07-16 ENCOUNTER — Ambulatory Visit (INDEPENDENT_AMBULATORY_CARE_PROVIDER_SITE_OTHER): Payer: 59 | Admitting: Family Medicine

## 2021-07-16 ENCOUNTER — Other Ambulatory Visit: Payer: Self-pay

## 2021-07-16 VITALS — BP 90/62 | HR 76 | Temp 98.0°F | Ht 64.0 in | Wt 149.0 lb

## 2021-07-16 DIAGNOSIS — Z9189 Other specified personal risk factors, not elsewhere classified: Secondary | ICD-10-CM | POA: Diagnosis not present

## 2021-07-16 DIAGNOSIS — Z6833 Body mass index (BMI) 33.0-33.9, adult: Secondary | ICD-10-CM

## 2021-07-16 DIAGNOSIS — E8881 Metabolic syndrome: Secondary | ICD-10-CM | POA: Diagnosis not present

## 2021-07-16 DIAGNOSIS — F5081 Binge eating disorder: Secondary | ICD-10-CM | POA: Diagnosis not present

## 2021-07-16 DIAGNOSIS — R632 Polyphagia: Secondary | ICD-10-CM

## 2021-07-16 DIAGNOSIS — E669 Obesity, unspecified: Secondary | ICD-10-CM | POA: Diagnosis not present

## 2021-07-16 MED ORDER — LISDEXAMFETAMINE DIMESYLATE 20 MG PO CAPS: 20.0000 mg | ORAL_CAPSULE | Freq: Every day | ORAL | 0 refills | Status: DC

## 2021-07-17 ENCOUNTER — Other Ambulatory Visit (INDEPENDENT_AMBULATORY_CARE_PROVIDER_SITE_OTHER): Payer: 59

## 2021-07-17 ENCOUNTER — Ambulatory Visit (INDEPENDENT_AMBULATORY_CARE_PROVIDER_SITE_OTHER): Payer: 59

## 2021-07-17 VITALS — BP 124/82 | HR 62 | Temp 98.0°F | Resp 18

## 2021-07-17 DIAGNOSIS — K5100 Ulcerative (chronic) pancolitis without complications: Secondary | ICD-10-CM

## 2021-07-17 LAB — CBC WITH DIFFERENTIAL/PLATELET
Basophils Absolute: 0 10*3/uL (ref 0.0–0.1)
Basophils Relative: 0.6 % (ref 0.0–3.0)
Eosinophils Absolute: 0.1 10*3/uL (ref 0.0–0.7)
Eosinophils Relative: 1.3 % (ref 0.0–5.0)
HCT: 42.7 % (ref 36.0–46.0)
Hemoglobin: 14.4 g/dL (ref 12.0–15.0)
Lymphocytes Relative: 48 % — ABNORMAL HIGH (ref 12.0–46.0)
Lymphs Abs: 2.4 10*3/uL (ref 0.7–4.0)
MCHC: 33.8 g/dL (ref 30.0–36.0)
MCV: 90.9 fl (ref 78.0–100.0)
Monocytes Absolute: 0.4 10*3/uL (ref 0.1–1.0)
Monocytes Relative: 8.7 % (ref 3.0–12.0)
Neutro Abs: 2.1 10*3/uL (ref 1.4–7.7)
Neutrophils Relative %: 41.4 % — ABNORMAL LOW (ref 43.0–77.0)
Platelets: 226 10*3/uL (ref 150.0–400.0)
RBC: 4.7 Mil/uL (ref 3.87–5.11)
RDW: 13.5 % (ref 11.5–15.5)
WBC: 5 10*3/uL (ref 4.0–10.5)

## 2021-07-17 LAB — COMPREHENSIVE METABOLIC PANEL
ALT: 14 U/L (ref 0–35)
AST: 14 U/L (ref 0–37)
Albumin: 4.3 g/dL (ref 3.5–5.2)
Alkaline Phosphatase: 52 U/L (ref 39–117)
BUN: 14 mg/dL (ref 6–23)
CO2: 28 mEq/L (ref 19–32)
Calcium: 9.7 mg/dL (ref 8.4–10.5)
Chloride: 103 mEq/L (ref 96–112)
Creatinine, Ser: 0.96 mg/dL (ref 0.40–1.20)
GFR: 65.89 mL/min (ref >60.00)
Glucose, Bld: 79 mg/dL (ref 70–99)
Potassium: 3.5 mEq/L (ref 3.5–5.1)
Sodium: 140 mEq/L (ref 135–145)
Total Bilirubin: 0.6 mg/dL (ref 0.2–1.2)
Total Protein: 6.8 g/dL (ref 6.0–8.3)

## 2021-07-17 LAB — IBC + FERRITIN
Ferritin: 76.1 ng/mL (ref 10.0–291.0)
Iron: 91 ug/dL (ref 42–145)
Saturation Ratios: 25 % (ref 20.0–50.0)
TIBC: 364 ug/dL (ref 250.0–450.0)
Transferrin: 260 mg/dL (ref 212.0–360.0)

## 2021-07-17 LAB — SEDIMENTATION RATE: Sed Rate: 6 mm/hr (ref 0–30)

## 2021-07-17 LAB — C-REACTIVE PROTEIN: CRP: <1 mg/dL (ref 0.5–20.0)

## 2021-07-17 LAB — TSH: TSH: 1.59 u[IU]/mL (ref 0.35–5.50)

## 2021-07-17 MED ORDER — ANTICOAGULANT SODIUM CITRATE 4% (200MG/5ML) IV SOLN
5.0000 mL | Freq: Once | Status: DC | PRN
  Filled 2021-07-17: qty 5

## 2021-07-17 MED ORDER — SODIUM CHLORIDE 0.9% FLUSH: 3.0000 mL | Freq: Once | INTRAVENOUS | Status: DC | PRN

## 2021-07-17 MED ORDER — DIPHENHYDRAMINE HCL 50 MG/ML IJ SOLN: 50.0000 mg | Freq: Once | INTRAMUSCULAR | Status: DC | PRN

## 2021-07-17 MED ORDER — EPINEPHRINE 0.3 MG/0.3ML IJ SOAJ: 0.3000 mg | Freq: Once | INTRAMUSCULAR | Status: DC | PRN

## 2021-07-17 MED ORDER — METHYLPREDNISOLONE SODIUM SUCC 125 MG IJ SOLR: 125.0000 mg | Freq: Once | INTRAMUSCULAR | Status: DC | PRN

## 2021-07-17 MED ORDER — HEPARIN SOD (PORK) LOCK FLUSH 100 UNIT/ML IV SOLN: 250.0000 [IU] | Freq: Once | INTRAVENOUS | Status: DC | PRN

## 2021-07-17 MED ORDER — SODIUM CHLORIDE 0.9 % IV SOLN: Freq: Once | INTRAVENOUS | Status: DC | PRN

## 2021-07-17 MED ORDER — ALBUTEROL SULFATE HFA 108 (90 BASE) MCG/ACT IN AERS: 2.0000 | INHALATION_SPRAY | Freq: Once | RESPIRATORY_TRACT | Status: DC | PRN

## 2021-07-17 MED ORDER — HEPARIN SOD (PORK) LOCK FLUSH 100 UNIT/ML IV SOLN: 500.0000 [IU] | Freq: Once | INTRAVENOUS | Status: DC | PRN

## 2021-07-17 MED ORDER — SODIUM CHLORIDE 0.9% FLUSH: 10.0000 mL | Freq: Once | INTRAVENOUS | Status: DC | PRN

## 2021-07-17 MED ORDER — SODIUM CHLORIDE 0.9 % IV SOLN
400.0000 mg | INTRAVENOUS | Status: DC
  Administered 2021-07-17: 400 mg via INTRAVENOUS
  Filled 2021-07-17: qty 40

## 2021-07-17 MED ORDER — ALTEPLASE 2 MG IJ SOLR: 2.0000 mg | Freq: Once | INTRAMUSCULAR | Status: DC | PRN

## 2021-07-17 NOTE — Progress Notes (Signed)
Diagnosis: Ulcerative pancolitis  Provider:  Marshell Garfinkel, MD  Procedure: Infusion  IV Type: Peripheral, IV Location: L Forearm  Remicade (Infliximab), Dose: 413m  Infusion Start Time: 09.20  07/17/2021  Infusion Stop Time: 11.36 07/17/2021  Post Infusion IV Care: Observation period completed and Peripheral IV Discontinued  Discharge: Condition: Good, Destination: Home . AVS provided to patient.   Performed by:  SArnoldo Morale RN

## 2021-07-20 ENCOUNTER — Other Ambulatory Visit: Payer: Self-pay | Admitting: Pharmacy Technician

## 2021-07-20 NOTE — Telephone Encounter (Signed)
Auth Submission: APPROVED  Payer: [employer] Medication & CPT/J Code(s) submitted: Remicade (Infliximab) J1745 Route of submission (phone, fax, portal): PHONE Auth type: Buy/Bill Units/visits requested: 80 UNITS Reference number: [id number]  (Note: fyi- new approval for increased dose - 80 units)

## 2021-07-21 ENCOUNTER — Telehealth: Payer: Self-pay | Admitting: Pharmacy Technician

## 2021-07-21 MED ORDER — LISDEXAMFETAMINE DIMESYLATE 20 MG PO CAPS: 20.0000 mg | ORAL_CAPSULE | Freq: Every day | ORAL | 0 refills | Status: DC

## 2021-07-21 NOTE — Telephone Encounter (Signed)
Patient is currently enrolled in the [facility].  Active date: 02/17/21 - 11/28/21 Melissa Cohen [phone] Fax# [phone]

## 2021-07-21 NOTE — Progress Notes (Signed)
Chief Complaint:   OBESITY Melissa Cohen is here to discuss her progress with her obesity treatment plan along with follow-up of her obesity related diagnoses.   Today's visit was #: 27 Starting weight: 196 lbs Starting date: 04/08/2020 Today's weight: 149 lbs Today's date: 07/16/2021 Weight change since last visit: 6 lbs Total lbs lost to date: 47 lbs Body mass index is 25.58 kg/m.  Total weight loss percentage to date: -23.98%  Current Meal Plan: keeping a food journal and adhering to recommended goals of 1200 calories and 60 grams of protein for 90-95% of the time.  Current Exercise Plan: Core strengthening/walking for 45 minutes 5-6 times per week. Current Anti-Obesity Medications: Ozempic 2 mg subcutaneously weekly. Side effects: None.  Interim History:  Melissa Cohen is down 47 pounds (149 pounds).  Her goal is 135 pounds, which will still keep her in a healthy BMI range.  Her blood pressure is low today, but history of the same and without orthostatic symptoms.  Assessment/Plan:   1. Metabolic syndrome GOAL MET x 2. Lose 7-10% of starting weight. She will continue to focus on protein-rich, low simple carbohydrate foods. We reviewed the importance of hydration, regular exercise for stress reduction, and restorative sleep.  We will continue to check lab work every 3 months, with 10% weight loss, or should any other concerns arise.  2. Polyphagia Controlled. Current treatment: Ozempic 2 mg subcutaneously weekly. She will continue to focus on protein-rich, low simple carbohydrate foods. We reviewed the importance of hydration, regular exercise for stress reduction, and restorative sleep.  3. Binge eating disorder Chiquitta is taking Vyvanse 20 mg daily.  The current medical regimen is effective;  continue present plan and medications.  I have consulted the [place] Controlled Substances Registry for this patient, and feel the risk/benefit ratio today is favorable for proceeding with this  prescription for a controlled substance. The patient understands monitoring parameters and red flags.   - Refill lisdexamfetamine (VYVANSE) 20 MG capsule; Take 1 capsule (20 mg total) by mouth daily.  Dispense: 30 capsule; Refill: 0  4. At risk for complication associated with hypotension Allyson was given approximately 8 minutes of education and counseling today regarding the condition of hypotension, the pathophysiology of the condition and concerns regarding prevention and treatment of this condition.  We discussed risks of medications used to treat hypertension, which in the setting of weight loss, can inadvertently cause hypotension.  Signs of hypotension such as feeling lightheaded or unsteady, esp when getting up first thing in the AM or off the toilet, were reviewed in detail and all questions were answered.    5. Obesity with current BMI of 25.6  Course: Preston is currently in the action stage of change. As such, her goal is to continue with weight loss efforts.   Nutrition goals: She has agreed to keeping a food journal and adhering to recommended goals of 1200 calories and 60 grams of protein.   Exercise goals:  As is.  Behavioral modification strategies: increasing lean protein intake, decreasing simple carbohydrates, increasing vegetables, and increasing water intake.  Melissa Cohen has agreed to follow-up with our clinic in 4 weeks. She was informed of the importance of frequent follow-up visits to maximize her success with intensive lifestyle modifications for her multiple health conditions.   Objective:   Blood pressure 90/62, pulse 76, temperature 98 F (36.7 C), temperature source Oral, height 5' 4"  (1.626 m), weight 149 lb (67.6 kg), last menstrual period 09/02/2013, SpO2 97 %. Body mass  index is 25.58 kg/m.  General: Cooperative, alert, well developed, in no acute distress. HEENT: Conjunctivae and lids unremarkable. Cardiovascular: Regular rhythm.  Lungs: Normal work of  breathing. Neurologic: No focal deficits.   Lab Results  Component Value Date   CREATININE 0.96 07/17/2021   BUN 14 07/17/2021   NA 140 07/17/2021   K 3.5 07/17/2021   CL 103 07/17/2021   CO2 28 07/17/2021   Lab Results  Component Value Date   ALT 14 07/17/2021   AST 14 07/17/2021   ALKPHOS 52 07/17/2021   BILITOT 0.6 07/17/2021   Lab Results  Component Value Date   HGBA1C 5.2 04/08/2020   Lab Results  Component Value Date   INSULIN 19.6 04/08/2020   Lab Results  Component Value Date   TSH 1.59 07/17/2021   Lab Results  Component Value Date   CHOL 205 (H) 09/19/2020   HDL 62.70 09/19/2020   LDLCALC 111 (H) 09/19/2020   LDLDIRECT 104.1 12/07/2011   TRIG 158.0 (H) 09/19/2020   CHOLHDL 3 09/19/2020   Lab Results  Component Value Date   VD25OH 57.72 09/19/2020   VD25OH 61.0 04/08/2020   VD25OH 41.54 07/31/2015   Lab Results  Component Value Date   WBC 5.0 07/17/2021   HGB 14.4 07/17/2021   HCT 42.7 07/17/2021   MCV 90.9 07/17/2021   PLT 226.0 07/17/2021   Lab Results  Component Value Date   IRON 91 07/17/2021   TIBC 364.0 07/17/2021   FERRITIN 76.1 07/17/2021   Attestation Statements:   Reviewed by clinician on day of visit: allergies, medications, problem list, medical history, surgical history, family history, social history, and previous encounter notes.  I, Water quality scientist, CMA, am acting as transcriptionist for Briscoe Deutscher, DO  I have reviewed the above documentation for accuracy and completeness, and I agree with the above. Briscoe Deutscher, DO

## 2021-07-22 LAB — QUANTIFERON-TB GOLD PLUS
Mitogen-NIL: >10 IU/mL
NIL: 0.14 IU/mL
QuantiFERON-TB Gold Plus: NEGATIVE
TB1-NIL: <0 IU/mL
TB2-NIL: <0 IU/mL

## 2021-07-22 LAB — HEPATITIS B SURFACE ANTIGEN: Hepatitis B Surface Ag: NONREACTIVE

## 2021-08-07 ENCOUNTER — Other Ambulatory Visit: Payer: Self-pay | Admitting: Pharmacy Technician

## 2021-08-10 ENCOUNTER — Other Ambulatory Visit: Payer: Self-pay | Admitting: Pharmacy Technician

## 2021-08-10 ENCOUNTER — Ambulatory Visit (INDEPENDENT_AMBULATORY_CARE_PROVIDER_SITE_OTHER): Payer: 59 | Admitting: Gastroenterology

## 2021-08-10 ENCOUNTER — Telehealth: Payer: Self-pay

## 2021-08-10 ENCOUNTER — Encounter: Payer: Self-pay | Admitting: Gastroenterology

## 2021-08-10 VITALS — BP 100/68 | HR 80 | Ht 63.0 in | Wt 152.5 lb

## 2021-08-10 DIAGNOSIS — K51019 Ulcerative (chronic) pancolitis with unspecified complications: Secondary | ICD-10-CM

## 2021-08-10 MED ORDER — HUMIRA-CD/UC/HS STARTER 80 MG/0.8ML ~~LOC~~ AJKT: 160.0000 mg | AUTO-INJECTOR | Freq: Once | SUBCUTANEOUS | 0 refills | Status: DC

## 2021-08-10 MED ORDER — HUMIRA (2 PEN) 40 MG/0.4ML ~~LOC~~ AJKT: 40.0000 mg | AUTO-INJECTOR | SUBCUTANEOUS | 3 refills | Status: DC

## 2021-08-10 NOTE — Progress Notes (Signed)
    History of Present Illness: This is a 55 year old female with ulcerative pancolitis.  In August she reported diarrhea and mild generalized abdominal pain were occurring about 4-5 weeks after receiving her Remicade infusions.  We raised the possibly of increasing her Remicade to 10 mg/kg every 8 weeks or changing to another biologic. Her current dose is 5 mg/kg every 6 weeks.  She relates that intravenous infusions are becoming increasingly difficult and less convenient for her due to her current job and she would like to consider changing to a subcutaneously delivered biologic. CBC, CMP, TSH, CRP, ESR and iron studies in August were unremarkable.  Quantiferron gold, HBsAg in August were both negative.   Colonoscopy 04/2021:  - The examined portion of the ileum was normal. - Diffuse mild inflammation was found in the entire examined colon secondary to quiescent ulcerative colitis. Biopsied. - Two 6 to 7 mm polyps in the rectum and in the sigmoid colon, removed with a cold snare. Resected and retrieved. - Multiple 4 to 8 mm polyps in the rectum, in the sigmoid colon and in the descending colon. - External and internal hemorrhoids. - The examination was otherwise normal on direct and retroflexion views.   Current Medications, Allergies, Past Medical History, Past Surgical History, Family History and Social History were reviewed in [employer] record.   Physical Exam: General: Well developed, well nourished, no acute distress Head: Normocephalic and atraumatic Eyes: Sclerae anicteric, EOMI Ears: Normal auditory acuity Mouth: Not examined, mask on during Covid-19 pandemic Lungs: Clear throughout to auscultation Heart: Regular rate and rhythm; no murmurs, rubs or bruits Abdomen: Soft, non tender and non distended. No masses, hepatosplenomegaly or hernias noted. Normal Bowel sounds Rectal: Not done Musculoskeletal: Symmetrical with no gross deformities  Pulses:  Normal  pulses noted Extremities: No clubbing, cyanosis, edema or deformities noted Neurological: Alert oriented x 4, grossly nonfocal Psychological:  Alert and cooperative. Normal mood and affect   Assessment and Recommendations:  Ulcerative pancolitis, quiescent  Currently maintained on Remicade 5 mg/kg infusions every 6 weeks with breakthrough diarrhea, mild abdominal pain typically beginning 4-5 weeks following her infusion.  We discussed concerns raised regarding her interactions with the [facility] infusion center staff at her last visit and she provided her perspective.  She requested to change to a self-delivered SQ biologic which is medically reasonable and would be more convenient for her.  We will determine if Humira is covered by her insurance plan and if so begin Humira SQ. [facility] infusion center appts will no longer be necessary as she is changing to a SQ biologic.  Her next infusion would be due on September 30 however since she typically has breakthrough symptoms a week or so in advance we can target to begin Humira September 20-30.  REV in 3 months.

## 2021-08-10 NOTE — Telephone Encounter (Signed)
Rx sent for new start Humira and maintenance.  Patient notified that she needs to download co-pay card and that will work with her insurance to obtain approval

## 2021-08-10 NOTE — Patient Instructions (Signed)
[facility] will contact you regarding your biologic change.   Please follow up with Dr. Fuller Plan in 3 months.   Thank you for choosing me and [facility].  Pricilla Riffle. Dagoberto Ligas., MD., Marval Regal

## 2021-08-12 ENCOUNTER — Other Ambulatory Visit (INDEPENDENT_AMBULATORY_CARE_PROVIDER_SITE_OTHER): Payer: Self-pay | Admitting: Family Medicine

## 2021-08-12 DIAGNOSIS — F5081 Binge eating disorder: Secondary | ICD-10-CM

## 2021-08-12 NOTE — Telephone Encounter (Signed)
Prior auth submitted to Cover My Meds

## 2021-08-13 MED ORDER — LISDEXAMFETAMINE DIMESYLATE 20 MG PO CAPS: 20.0000 mg | ORAL_CAPSULE | Freq: Every day | ORAL | 0 refills | Status: DC

## 2021-08-13 NOTE — Telephone Encounter (Signed)
Prior auth from [place] filled out and faxed to [phone]

## 2021-08-13 NOTE — Telephone Encounter (Signed)
Pt last seen by Dr. Wallace.  

## 2021-08-16 ENCOUNTER — Other Ambulatory Visit: Payer: Self-pay | Admitting: Family Medicine

## 2021-08-16 DIAGNOSIS — F39 Unspecified mood [affective] disorder: Secondary | ICD-10-CM

## 2021-08-17 ENCOUNTER — Other Ambulatory Visit (HOSPITAL_COMMUNITY): Payer: Self-pay

## 2021-08-17 ENCOUNTER — Other Ambulatory Visit: Payer: Self-pay | Admitting: Family Medicine

## 2021-08-17 DIAGNOSIS — F39 Unspecified mood [affective] disorder: Secondary | ICD-10-CM

## 2021-08-18 ENCOUNTER — Telehealth: Payer: Self-pay | Admitting: Gastroenterology

## 2021-08-18 ENCOUNTER — Ambulatory Visit: Payer: 59

## 2021-08-18 MED ORDER — HUMIRA (2 PEN) 40 MG/0.4ML ~~LOC~~ AJKT
40.0000 mg | AUTO-INJECTOR | SUBCUTANEOUS | 3 refills | Status: DC
Start: 2021-08-18 — End: 2021-08-18

## 2021-08-18 MED ORDER — HUMIRA (2 PEN) 40 MG/0.4ML ~~LOC~~ AJKT: 40.0000 mg | AUTO-INJECTOR | SUBCUTANEOUS | 1 refills | Status: DC

## 2021-08-18 MED ORDER — HUMIRA-CD/UC/HS STARTER 80 MG/0.8ML ~~LOC~~ AJKT: 160.0000 mg | AUTO-INJECTOR | Freq: Once | SUBCUTANEOUS | 0 refills | Status: DC

## 2021-08-18 NOTE — Telephone Encounter (Signed)
See myChart messages for additional details.

## 2021-08-18 NOTE — Telephone Encounter (Signed)
Inbound call from patient. Requesting a call back to discuss other options for medication. States her insurance will only pay for one month supply of Humira and that is not going to work for her because she travel a lot which can be up to 6 weeks at a time.

## 2021-08-19 ENCOUNTER — Other Ambulatory Visit: Payer: Self-pay | Admitting: Family Medicine

## 2021-08-19 DIAGNOSIS — F39 Unspecified mood [affective] disorder: Secondary | ICD-10-CM

## 2021-08-21 ENCOUNTER — Other Ambulatory Visit (HOSPITAL_COMMUNITY): Payer: Self-pay

## 2021-08-21 MED ORDER — GABAPENTIN 300 MG PO CAPS
ORAL_CAPSULE | ORAL | 3 refills | Status: DC
  Filled 2021-08-21: qty 90, 30d supply, fill #0

## 2021-08-26 ENCOUNTER — Encounter (INDEPENDENT_AMBULATORY_CARE_PROVIDER_SITE_OTHER): Payer: Self-pay | Admitting: Family Medicine

## 2021-08-26 ENCOUNTER — Ambulatory Visit (INDEPENDENT_AMBULATORY_CARE_PROVIDER_SITE_OTHER): Payer: 59 | Admitting: Family Medicine

## 2021-08-26 ENCOUNTER — Other Ambulatory Visit: Payer: Self-pay

## 2021-08-26 VITALS — BP 102/68 | HR 76 | Temp 98.1°F | Ht 64.0 in | Wt 146.0 lb

## 2021-08-26 DIAGNOSIS — Z6833 Body mass index (BMI) 33.0-33.9, adult: Secondary | ICD-10-CM

## 2021-08-26 DIAGNOSIS — E669 Obesity, unspecified: Secondary | ICD-10-CM

## 2021-08-26 DIAGNOSIS — E8881 Metabolic syndrome: Secondary | ICD-10-CM | POA: Diagnosis not present

## 2021-08-26 DIAGNOSIS — F5081 Binge eating disorder: Secondary | ICD-10-CM | POA: Diagnosis not present

## 2021-08-26 DIAGNOSIS — Z9189 Other specified personal risk factors, not elsewhere classified: Secondary | ICD-10-CM

## 2021-08-26 MED ORDER — LISDEXAMFETAMINE DIMESYLATE 30 MG PO CAPS: 30.0000 mg | ORAL_CAPSULE | Freq: Every day | ORAL | 0 refills | Status: DC

## 2021-08-26 MED ORDER — LISDEXAMFETAMINE DIMESYLATE 40 MG PO CAPS: 40.0000 mg | ORAL_CAPSULE | ORAL | 0 refills | Status: DC

## 2021-08-27 ENCOUNTER — Other Ambulatory Visit (HOSPITAL_COMMUNITY): Payer: Self-pay

## 2021-08-27 NOTE — Progress Notes (Signed)
Chief Complaint:   OBESITY Melissa Cohen is here to discuss her progress with her obesity treatment plan along with follow-up of her obesity related diagnoses.   Today's visit was #: 37 Starting weight: 196 lbs Starting date: 04/08/2020 Today's weight: 146 lbs Today's date: 08/26/2021 Weight change since last visit: 3 lbs Total lbs lost to date: 50 lbs Body mass index is 25.06 kg/m.  Total weight loss percentage to date: -25.51%  Current Meal Plan: keeping a food journal and adhering to recommended goals of 1200 calories and 60 grams of protein for 80% of the time.  Current Exercise Plan: Walking, core strengthening for 30+ minutes 2-7 times per week. Current Anti-Obesity Medications: Ozempic 2 mg subcutaneously weekly. Side effects: None.  Interim History:  Melissa Cohen is down 57 pounds!  She says she is having increased hunger and cravings.  Her goal weight is 135 pounds.  Assessment/Plan:   1. Metabolic syndrome She will continue to focus on protein-rich, low simple carbohydrate foods. We reviewed the importance of hydration, regular exercise for stress reduction, and restorative sleep.  We will continue to check lab work every 3 months, with 10% weight loss, or should any other concerns arise.  2. Binge eating disorder Melissa Cohen has been taking Vyvanse 20 mg daily for BED.  She endorses increased impulsive eating.  Plan:  Will advance dose of Vyvanse to 30 mg daily for 1 month, and then 40 mg daily.  - Increase lisdexamfetamine (VYVANSE) 30 MG capsule; Take 1 capsule (30 mg total) by mouth daily.  Dispense: 30 capsule; Refill: 0 - Increase lisdexamfetamine (VYVANSE) 40 MG capsule; Take 1 capsule (40 mg total) by mouth every morning.  Dispense: 30 capsule; Refill: 0  I have consulted the [place] Controlled Substances Registry for this patient, and feel the risk/benefit ratio today is favorable for proceeding with this prescription for a controlled substance. The patient understands monitoring  parameters and red flags.   3. At risk for heart disease Due to Melissa Cohen's current state of health and medical condition(s), she is at a higher risk for heart disease.  This puts the patient at much greater risk to subsequently develop cardiopulmonary conditions that can significantly affect patient's quality of life in a negative manner.    At least 8 minutes were spent on counseling Melissa Cohen about these concerns today. Evidence-based interventions for health behavior change were utilized today including the discussion of self monitoring techniques, problem-solving barriers, and SMART goal setting techniques.  Specifically, regarding patient's less desirable eating habits and patterns, we employed the technique of small changes when Melissa Cohen has not been able to fully commit to her prudent nutritional plan.  4. Obesity with current BMI of 25.1  Course: Melissa Cohen is currently in the action stage of change. As such, her goal is to continue with weight loss efforts.   Nutrition goals: She has agreed to keeping a food journal and adhering to recommended goals of 1200 calories and 60 grams of protein.   Exercise goals:  As is.  Behavioral modification strategies: increasing lean protein intake, decreasing simple carbohydrates, increasing vegetables, increasing water intake, and emotional eating strategies.  Melissa Cohen has agreed to follow-up with our clinic in 5-6 weeks. She was informed of the importance of frequent follow-up visits to maximize her success with intensive lifestyle modifications for her multiple health conditions.   Objective:   Blood pressure 102/68, pulse 76, temperature 98.1 F (36.7 C), temperature source Oral, height 5' 4"  (1.626 m), weight 146 lb (66.2 kg),  last menstrual period 09/02/2013, SpO2 99 %. Body mass index is 25.06 kg/m.  General: Cooperative, alert, well developed, in no acute distress. HEENT: Conjunctivae and lids unremarkable. Cardiovascular: Regular rhythm.  Lungs:  Normal work of breathing. Neurologic: No focal deficits.   Lab Results  Component Value Date   CREATININE 0.96 07/17/2021   BUN 14 07/17/2021   NA 140 07/17/2021   K 3.5 07/17/2021   CL 103 07/17/2021   CO2 28 07/17/2021   Lab Results  Component Value Date   ALT 14 07/17/2021   AST 14 07/17/2021   ALKPHOS 52 07/17/2021   BILITOT 0.6 07/17/2021   Lab Results  Component Value Date   HGBA1C 5.2 04/08/2020   Lab Results  Component Value Date   INSULIN 19.6 04/08/2020   Lab Results  Component Value Date   TSH 1.59 07/17/2021   Lab Results  Component Value Date   CHOL 205 (H) 09/19/2020   HDL 62.70 09/19/2020   LDLCALC 111 (H) 09/19/2020   LDLDIRECT 104.1 12/07/2011   TRIG 158.0 (H) 09/19/2020   CHOLHDL 3 09/19/2020   Lab Results  Component Value Date   VD25OH 57.72 09/19/2020   VD25OH 61.0 04/08/2020   VD25OH 41.54 07/31/2015   Lab Results  Component Value Date   WBC 5.0 07/17/2021   HGB 14.4 07/17/2021   HCT 42.7 07/17/2021   MCV 90.9 07/17/2021   PLT 226.0 07/17/2021   Lab Results  Component Value Date   IRON 91 07/17/2021   TIBC 364.0 07/17/2021   FERRITIN 76.1 07/17/2021   Attestation Statements:   Reviewed by clinician on day of visit: allergies, medications, problem list, medical history, surgical history, family history, social history, and previous encounter notes.  I, Water quality scientist, CMA, am acting as transcriptionist for Briscoe Deutscher, DO  I have reviewed the above documentation for accuracy and completeness, and I agree with the above. Briscoe Deutscher, DO

## 2021-08-28 ENCOUNTER — Ambulatory Visit: Payer: 59

## 2021-09-11 ENCOUNTER — Telehealth: Payer: Self-pay | Admitting: Gastroenterology

## 2021-09-11 ENCOUNTER — Encounter: Payer: Self-pay | Admitting: Family Medicine

## 2021-09-11 NOTE — Telephone Encounter (Signed)
I spoke with Melissa Cohen.  She is aware I have request from [facility] for misfire of her pen, but Dr. Fuller Plan must physically sign and he is out of the office.  She states that she was told I could provide a verbal to [phone].  Humira replacement authorized with [facility].  They will reach out today to arrange delivery.

## 2021-09-11 NOTE — Telephone Encounter (Signed)
Pt called stating she needed to talk to you urgently about making sure her medication got called in this morning as her pharmacy does not have weekend hours and she needs the med for Monday.  Please call patient as soon as possible.  Thank you.

## 2021-09-14 ENCOUNTER — Other Ambulatory Visit (INDEPENDENT_AMBULATORY_CARE_PROVIDER_SITE_OTHER): Payer: Self-pay | Admitting: Family Medicine

## 2021-09-14 DIAGNOSIS — E8881 Metabolic syndrome: Secondary | ICD-10-CM

## 2021-09-16 ENCOUNTER — Other Ambulatory Visit (INDEPENDENT_AMBULATORY_CARE_PROVIDER_SITE_OTHER): Payer: Self-pay | Admitting: Family Medicine

## 2021-09-16 ENCOUNTER — Other Ambulatory Visit: Payer: Self-pay | Admitting: Family Medicine

## 2021-09-16 ENCOUNTER — Encounter (INDEPENDENT_AMBULATORY_CARE_PROVIDER_SITE_OTHER): Payer: Self-pay | Admitting: Family Medicine

## 2021-09-16 DIAGNOSIS — E8881 Metabolic syndrome: Secondary | ICD-10-CM

## 2021-09-16 NOTE — Telephone Encounter (Signed)
Dr.Wallace °

## 2021-09-16 NOTE — Telephone Encounter (Signed)
Please see message and advise.  Thank you. ° °

## 2021-09-22 ENCOUNTER — Ambulatory Visit: Payer: 59

## 2021-09-29 ENCOUNTER — Ambulatory Visit (INDEPENDENT_AMBULATORY_CARE_PROVIDER_SITE_OTHER): Payer: 59 | Admitting: Family Medicine

## 2021-09-29 ENCOUNTER — Encounter (INDEPENDENT_AMBULATORY_CARE_PROVIDER_SITE_OTHER): Payer: Self-pay | Admitting: Family Medicine

## 2021-09-29 ENCOUNTER — Other Ambulatory Visit (HOSPITAL_COMMUNITY): Payer: Self-pay

## 2021-09-29 ENCOUNTER — Other Ambulatory Visit: Payer: Self-pay

## 2021-09-29 VITALS — BP 109/75 | HR 79 | Temp 97.8°F | Ht 62.5 in | Wt 137.0 lb

## 2021-09-29 DIAGNOSIS — E669 Obesity, unspecified: Secondary | ICD-10-CM | POA: Diagnosis not present

## 2021-09-29 DIAGNOSIS — E8881 Metabolic syndrome: Secondary | ICD-10-CM

## 2021-09-29 DIAGNOSIS — Z6833 Body mass index (BMI) 33.0-33.9, adult: Secondary | ICD-10-CM | POA: Diagnosis not present

## 2021-09-29 DIAGNOSIS — F5081 Binge eating disorder: Secondary | ICD-10-CM

## 2021-09-29 MED ORDER — LISDEXAMFETAMINE DIMESYLATE 40 MG PO CAPS: 40.0000 mg | ORAL_CAPSULE | ORAL | 0 refills | Status: DC

## 2021-09-29 MED ORDER — OZEMPIC (1 MG/DOSE) 4 MG/3ML ~~LOC~~ SOPN
1.0000 mg | PEN_INJECTOR | SUBCUTANEOUS | 0 refills | Status: DC
  Filled 2021-09-29: qty 18, 84d supply, fill #0

## 2021-09-29 MED ORDER — OZEMPIC (2 MG/DOSE) 8 MG/3ML ~~LOC~~ SOPN
2.0000 mg | PEN_INJECTOR | SUBCUTANEOUS | 0 refills | Status: DC
  Filled 2021-09-29: qty 3, fill #0
  Filled 2021-09-30: qty 3, 28d supply, fill #0

## 2021-09-30 ENCOUNTER — Other Ambulatory Visit (HOSPITAL_COMMUNITY): Payer: Self-pay

## 2021-09-30 NOTE — Progress Notes (Signed)
Chief Complaint:   OBESITY Melissa Cohen is here to discuss her progress with her obesity treatment plan along with follow-up of her obesity related diagnoses. See Medical Weight Management Flowsheet for complete bioelectrical impedance results.  Today's visit was #: 20 Starting weight: 196 lbs Starting date: 04/08/2020 Weight change since last visit: 9 lbs Total lbs lost to date: 59 lbs Total weight loss percentage to date: -30.10%  Nutrition Plan: Keeping a food journal and adhering to recommended goals of 1200 calories and 60 grams of protein daily for 80-90% of the time. Activity: Walking for 30-45 minutes 5+ times per week.  Anti-obesity medications: Ozempic 2 mg subcutaneously weekly. Reported side effects: None.  Interim History: [name] has had 4-5 weeks of cruises coming up.  She says she is happy with her weight.  Plan:  Maintenance.  Discussed decreasing dose of Ozempic if unable to take in enough calories.  Assessment/Plan:   1. Metabolic syndrome Starting goal: Lose 7-10% of starting weight. She will continue to focus on protein-rich, low simple carbohydrate foods. We reviewed the importance of hydration, regular exercise for stress reduction, and restorative sleep.  We will continue to check lab work every 3 months, with 10% weight loss, or should any other concerns arise.  Plan:  Continue Ozempic 2 mg subcutaneously weekly, as per below.  - Refill Semaglutide, 2 MG/DOSE, (OZEMPIC, 2 MG/DOSE,) 8 MG/3ML SOPN; Inject 2 mg into the skin once a week.  Dispense: 2 mL; Refill: 0 - Semaglutide, 1 MG/DOSE, (OZEMPIC, 1 MG/DOSE,) 4 MG/3ML SOPN; Inject 1 mg into the skin 2 (two) times a week.  Dispense: 18 mL; Refill: 0  2. Binge eating disorder [name] is taking Vyanse 40 mg daily for BED. The current medical regimen is effective;  continue present plan and medications.  The goals for treatment of BED are to reduce eating binges and to achieve healthy eating habits. Because binge  eating can correlate with negative emotions, treatment may also address any other mental-health issues, such as depression.  People who binge eat feel as if they don't have control over how much they eat and have feelings of guilt or self-loathing after a binge eating episode.  The FDA has approved Vyvanse as a treatment option for binge eating disorder. Vyvanse targets the brain's reward center by increasing the levels of dopamine and norepinephrine, the chemicals of the brain responsible for feelings of pleasure.  Mindful eating is the recommended nutritional approach to treating BED.   - Refill lisdexamfetamine (VYVANSE) 40 MG capsule; Take 1 capsule (40 mg total) by mouth every morning.  Dispense: 30 capsule; Refill: 0  I have consulted the [place] Controlled Substances Registry for this patient, and feel the risk/benefit ratio today is favorable for proceeding with this prescription for a controlled substance. The patient understands monitoring parameters and red flags.   3. Obesity, current BMI 23.6  Course: Melissa Cohen is currently in the action stage of change. As such, her goal is to continue with weight loss efforts.   Nutrition goals: She has agreed to practicing portion control and making smarter food choices, such as increasing vegetables and decreasing simple carbohydrates.   Exercise goals:  As is.  Behavioral modification strategies: increasing lean protein intake, decreasing simple carbohydrates, increasing vegetables, and increasing water intake.  Melissa Cohen has agreed to follow-up with our clinic in 8 weeks. She was informed of the importance of frequent follow-up visits to maximize her success with intensive lifestyle modifications for her multiple health conditions.  Objective:   Blood pressure 109/75, pulse 79, temperature 97.8 F (36.6 C), temperature source Oral, height 5' 2.5" (1.588 m), weight 137 lb (62.1 kg), last menstrual period 09/02/2013, SpO2 97 %. Body mass index is  24.66 kg/m.  General: Cooperative, alert, well developed, in no acute distress. HEENT: Conjunctivae and lids unremarkable. Cardiovascular: Regular rhythm.  Lungs: Normal work of breathing. Neurologic: No focal deficits.   Lab Results  Component Value Date   CREATININE 0.96 07/17/2021   BUN 14 07/17/2021   NA 140 07/17/2021   K 3.5 07/17/2021   CL 103 07/17/2021   CO2 28 07/17/2021   Lab Results  Component Value Date   ALT 14 07/17/2021   AST 14 07/17/2021   ALKPHOS 52 07/17/2021   BILITOT 0.6 07/17/2021   Lab Results  Component Value Date   HGBA1C 5.2 04/08/2020   Lab Results  Component Value Date   INSULIN 19.6 04/08/2020   Lab Results  Component Value Date   TSH 1.59 07/17/2021   Lab Results  Component Value Date   CHOL 205 (H) 09/19/2020   HDL 62.70 09/19/2020   LDLCALC 111 (H) 09/19/2020   LDLDIRECT 104.1 12/07/2011   TRIG 158.0 (H) 09/19/2020   CHOLHDL 3 09/19/2020   Lab Results  Component Value Date   VD25OH 57.72 09/19/2020   VD25OH 61.0 04/08/2020   VD25OH 41.54 07/31/2015   Lab Results  Component Value Date   WBC 5.0 07/17/2021   HGB 14.4 07/17/2021   HCT 42.7 07/17/2021   MCV 90.9 07/17/2021   PLT 226.0 07/17/2021   Lab Results  Component Value Date   IRON 91 07/17/2021   TIBC 364.0 07/17/2021   FERRITIN 76.1 07/17/2021   Attestation Statements:   Reviewed by clinician on day of visit: allergies, medications, problem list, medical history, surgical history, family history, social history, and previous encounter notes.  Time spent on visit including pre-visit chart review and post-visit care and documentation was 43 minutes. Time was spent on: preparing to see the patient (eg, review of tests), performing a medically necessary appropriate examination and/or evaluation, counseling and educating the patient/family/caregiver, and documenting clinical information in the electronic or other health.   I, Water quality scientist, CMA, am acting as  transcriptionist for Briscoe Deutscher, DO  I have reviewed the above documentation for accuracy and completeness, and I agree with the above. -  Briscoe Deutscher, DO, MS, FAAFP, DABOM - Family and Bariatric Medicine.

## 2021-10-19 ENCOUNTER — Ambulatory Visit: Payer: 59

## 2021-11-03 ENCOUNTER — Ambulatory Visit: Payer: 59

## 2021-11-12 ENCOUNTER — Encounter: Payer: Self-pay | Admitting: Family Medicine

## 2021-11-12 ENCOUNTER — Encounter (INDEPENDENT_AMBULATORY_CARE_PROVIDER_SITE_OTHER): Payer: Self-pay | Admitting: Family Medicine

## 2021-11-12 ENCOUNTER — Ambulatory Visit (INDEPENDENT_AMBULATORY_CARE_PROVIDER_SITE_OTHER): Payer: 59 | Admitting: Family Medicine

## 2021-11-12 VITALS — BP 114/74 | HR 79 | Temp 97.7°F | Resp 16 | Ht 62.5 in | Wt 137.8 lb

## 2021-11-12 DIAGNOSIS — E559 Vitamin D deficiency, unspecified: Secondary | ICD-10-CM

## 2021-11-12 DIAGNOSIS — F5081 Binge eating disorder: Secondary | ICD-10-CM

## 2021-11-12 DIAGNOSIS — E8881 Metabolic syndrome: Secondary | ICD-10-CM

## 2021-11-12 DIAGNOSIS — I10 Essential (primary) hypertension: Secondary | ICD-10-CM | POA: Diagnosis not present

## 2021-11-12 DIAGNOSIS — Z0000 Encounter for general adult medical examination without abnormal findings: Secondary | ICD-10-CM

## 2021-11-12 LAB — CBC WITH DIFFERENTIAL/PLATELET
Basophils Absolute: 0 10*3/uL (ref 0.0–0.1)
Basophils Relative: 0.4 % (ref 0.0–3.0)
Eosinophils Absolute: 0.1 10*3/uL (ref 0.0–0.7)
Eosinophils Relative: 1.2 % (ref 0.0–5.0)
HCT: 45.3 % (ref 36.0–46.0)
Hemoglobin: 15.4 g/dL — ABNORMAL HIGH (ref 12.0–15.0)
Lymphocytes Relative: 45.6 % (ref 12.0–46.0)
Lymphs Abs: 2.8 10*3/uL (ref 0.7–4.0)
MCHC: 33.9 g/dL (ref 30.0–36.0)
MCV: 91.5 fl (ref 78.0–100.0)
Monocytes Absolute: 0.5 10*3/uL (ref 0.1–1.0)
Monocytes Relative: 7.6 % (ref 3.0–12.0)
Neutro Abs: 2.8 10*3/uL (ref 1.4–7.7)
Neutrophils Relative %: 45.2 % (ref 43.0–77.0)
Platelets: 297 10*3/uL (ref 150.0–400.0)
RBC: 4.95 Mil/uL (ref 3.87–5.11)
RDW: 13.5 % (ref 11.5–15.5)
WBC: 6.1 10*3/uL (ref 4.0–10.5)

## 2021-11-12 NOTE — Assessment & Plan Note (Signed)
Pt's PE WNL.  UTD on colonoscopy, pap, mammo, Tdap, flu.  Check labs.  Anticipatory guidance provided.

## 2021-11-12 NOTE — Assessment & Plan Note (Signed)
Check labs and replete prn. 

## 2021-11-12 NOTE — Progress Notes (Signed)
° °  Subjective:    Patient ID: Melissa Cohen, female    DOB: 1966/09/03, 55 y.o.   MRN: [id number]  HPI CPE- UTD on colonoscopy, mammo, pap, flu, Tdap.  Pt has lost 60+ lbs since last visit  Patient Care Team    Relationship Specialty Notifications Start End  Midge Minium, MD PCP - General Family Medicine  08/17/21   Ladene Artist, MD Consulting Physician Gastroenterology  09/09/16   Ward Givens, NP Consulting Physician Neurology  09/19/20   Tamsen Meek, MD Consulting Physician Dermatology  09/19/20   Leonie Man, MD Consulting Physician Cardiology  09/19/20     Health Maintenance  Topic Date Due   HIV Screening  Never done   Pneumococcal Vaccine 9-60 Years old (2 - PPSV23 if available, else PCV20) 07/12/2019   COVID-19 Vaccine (4 - Booster for Moderna series) 10/14/2021   PAP SMEAR-Modifier  07/26/2022   MAMMOGRAM  03/19/2023   COLONOSCOPY (Pts 45-53yr Insurance coverage will need to be confirmed)  05/15/2023   TETANUS/TDAP  09/29/2023   INFLUENZA VACCINE  Completed   Hepatitis C Screening  Completed   Zoster Vaccines- Shingrix  Completed   HPV VACCINES  Aged Out      Review of Systems Patient reports no vision/ hearing changes, adenopathy,fever, persistant/recurrent hoarseness , swallowing issues, chest pain, palpitations, edema, persistant/recurrent cough, hemoptysis, dyspnea (rest/exertional/paroxysmal nocturnal), gastrointestinal bleeding (melena, rectal bleeding), abdominal pain, significant heartburn, bowel changes, GU symptoms (dysuria, hematuria, incontinence), Gyn symptoms (abnormal  bleeding, pain),  syncope, focal weakness, memory loss, numbness & tingling, skin/hair/nail changes, abnormal bruising or bleeding, anxiety, or depression.   This visit occurred during the SARS-CoV-2 public health emergency.  Safety protocols were in place, including screening questions prior to the visit, additional usage of staff PPE, and extensive cleaning  of exam room while observing appropriate contact time as indicated for disinfecting solutions.      Objective:   Physical Exam General Appearance:    Alert, cooperative, no distress, appears stated age  Head:    Normocephalic, without obvious abnormality, atraumatic  Eyes:    PERRL, conjunctiva/corneas clear, EOM's intact, fundi    benign, both eyes  Ears:    Normal TM's and external ear canals, both ears  Nose:   Deferred due to COVID  Throat:   Neck:   Supple, symmetrical, trachea midline, no adenopathy;    Thyroid: no enlargement/tenderness/nodules  Back:     Symmetric, no curvature, ROM normal, no CVA tenderness  Lungs:     Clear to auscultation bilaterally, respirations unlabored  Chest Wall:    No tenderness or deformity   Heart:    Regular rate and rhythm, S1 and S2 normal, no murmur, rub   or gallop  Breast Exam:    Deferred to mammo  Abdomen:     Soft, non-tender, bowel sounds active all four quadrants,    no masses, no organomegaly  Genitalia:    Deferred to GYN  Rectal:    Extremities:   Extremities normal, atraumatic, no cyanosis or edema  Pulses:   2+ and symmetric all extremities  Skin:   Skin color, texture, turgor normal, no rashes or lesions  Lymph nodes:   Cervical, supraclavicular, and axillary nodes normal  Neurologic:   CNII-XII intact, normal strength, sensation and reflexes    throughout          Assessment & Plan:

## 2021-11-12 NOTE — Assessment & Plan Note (Signed)
Chronic problem.  Well controlled today.  Currently asymptomatic.

## 2021-11-12 NOTE — Patient Instructions (Signed)
Follow up in 1 year or as needed We'll notify you of your lab results and make any changes if needed Continue to work on healthy diet and regular exercise- you look great!!!  I'm SO proud of you!! Call with any questions or concerns Stay Safe!  Stay Healthy! Happy Holidays!!

## 2021-11-13 ENCOUNTER — Telehealth: Payer: Self-pay

## 2021-11-13 LAB — BASIC METABOLIC PANEL
BUN: 12 mg/dL (ref 6–23)
CO2: 29 mEq/L (ref 19–32)
Calcium: 10 mg/dL (ref 8.4–10.5)
Chloride: 102 mEq/L (ref 96–112)
Creatinine, Ser: 1.02 mg/dL (ref 0.40–1.20)
GFR: 61.12 mL/min (ref >60.00)
Glucose, Bld: 67 mg/dL — ABNORMAL LOW (ref 70–99)
Potassium: 4 mEq/L (ref 3.5–5.1)
Sodium: 140 mEq/L (ref 135–145)

## 2021-11-13 LAB — LIPID PANEL
Cholesterol: 213 mg/dL — ABNORMAL HIGH (ref 0–200)
HDL: 70.6 mg/dL (ref >39.00)
LDL Cholesterol: 122 mg/dL — ABNORMAL HIGH (ref 0–99)
NonHDL: 142.25
Total CHOL/HDL Ratio: 3
Triglycerides: 100 mg/dL (ref 0.0–149.0)
VLDL: 20 mg/dL (ref 0.0–40.0)

## 2021-11-13 LAB — TSH: TSH: 2.13 u[IU]/mL (ref 0.35–5.50)

## 2021-11-13 LAB — HEPATIC FUNCTION PANEL
ALT: 12 U/L (ref 0–35)
AST: 16 U/L (ref 0–37)
Albumin: 4.5 g/dL (ref 3.5–5.2)
Alkaline Phosphatase: 54 U/L (ref 39–117)
Bilirubin, Direct: 0.1 mg/dL (ref 0.0–0.3)
Total Bilirubin: 0.6 mg/dL (ref 0.2–1.2)
Total Protein: 6.9 g/dL (ref 6.0–8.3)

## 2021-11-13 LAB — VITAMIN D 25 HYDROXY (VIT D DEFICIENCY, FRACTURES): VITD: 86.75 ng/mL (ref 30.00–100.00)

## 2021-11-13 NOTE — Telephone Encounter (Signed)
Patient is aware of labs

## 2021-11-13 NOTE — Telephone Encounter (Signed)
-----   Message from Midge Minium, MD sent at 11/13/2021  2:39 PM EST ----- Labs look great!  No changes at this time

## 2021-11-16 ENCOUNTER — Encounter: Payer: Self-pay | Admitting: Family Medicine

## 2021-11-16 ENCOUNTER — Encounter: Payer: Self-pay | Admitting: Gastroenterology

## 2021-11-16 NOTE — Telephone Encounter (Signed)
Pt last seen by Dr. Wallace.  

## 2021-11-17 ENCOUNTER — Other Ambulatory Visit: Payer: Self-pay

## 2021-11-17 MED ORDER — OZEMPIC (1 MG/DOSE) 4 MG/3ML ~~LOC~~ SOPN
1.0000 mg | PEN_INJECTOR | SUBCUTANEOUS | 0 refills | Status: DC
Start: 2021-11-17 — End: 2021-12-01

## 2021-11-17 MED ORDER — LISDEXAMFETAMINE DIMESYLATE 30 MG PO CAPS
30.0000 mg | ORAL_CAPSULE | Freq: Every day | ORAL | 0 refills | Status: DC
Start: 2021-11-17 — End: 2021-12-15

## 2021-11-17 MED ORDER — HUMIRA (2 PEN) 40 MG/0.4ML ~~LOC~~ AJKT: 40.0000 mg | AUTO-INJECTOR | SUBCUTANEOUS | 1 refills | Status: DC

## 2021-11-25 ENCOUNTER — Other Ambulatory Visit: Payer: Self-pay | Admitting: Pharmacy Technician

## 2021-11-26 ENCOUNTER — Encounter (INDEPENDENT_AMBULATORY_CARE_PROVIDER_SITE_OTHER): Payer: Self-pay | Admitting: Family Medicine

## 2021-11-26 ENCOUNTER — Encounter: Payer: Self-pay | Admitting: Family Medicine

## 2021-11-26 ENCOUNTER — Other Ambulatory Visit: Payer: Self-pay | Admitting: Family Medicine

## 2021-11-26 ENCOUNTER — Other Ambulatory Visit (INDEPENDENT_AMBULATORY_CARE_PROVIDER_SITE_OTHER): Payer: Self-pay | Admitting: Family Medicine

## 2021-11-26 MED ORDER — CELECOXIB 200 MG PO CAPS: 200.0000 mg | ORAL_CAPSULE | Freq: Two times a day (BID) | ORAL | 0 refills | Status: DC

## 2021-11-26 MED ORDER — GABAPENTIN 300 MG PO CAPS: 900.0000 mg | ORAL_CAPSULE | Freq: Every day | ORAL | 0 refills | Status: DC

## 2021-11-26 NOTE — Telephone Encounter (Signed)
Please refer to previous message for handling of this request.

## 2021-11-26 NOTE — Telephone Encounter (Signed)
Pt called in checking on this, please advise   She states that she has lost 70 lbs and her cholesterol is still high   Pt can be reached at the home # or via mychart.   Please see notes

## 2021-11-27 ENCOUNTER — Encounter (INDEPENDENT_AMBULATORY_CARE_PROVIDER_SITE_OTHER): Payer: Self-pay | Admitting: Family Medicine

## 2021-11-27 DIAGNOSIS — E8881 Metabolic syndrome: Secondary | ICD-10-CM

## 2021-12-01 MED ORDER — VICTOZA 18 MG/3ML ~~LOC~~ SOPN: 1.2000 mg | PEN_INJECTOR | Freq: Every day | SUBCUTANEOUS | 0 refills | Status: DC

## 2021-12-01 NOTE — Telephone Encounter (Signed)
See other mychart msg

## 2021-12-02 ENCOUNTER — Telehealth: Payer: Self-pay | Admitting: Gastroenterology

## 2021-12-02 NOTE — Telephone Encounter (Signed)
Inbound call from [facility], stated that patient needs a pre authorization for Humira pen. Please advise

## 2021-12-03 ENCOUNTER — Other Ambulatory Visit: Payer: Self-pay

## 2021-12-03 ENCOUNTER — Encounter (INDEPENDENT_AMBULATORY_CARE_PROVIDER_SITE_OTHER): Payer: Self-pay | Admitting: Family Medicine

## 2021-12-03 ENCOUNTER — Ambulatory Visit (INDEPENDENT_AMBULATORY_CARE_PROVIDER_SITE_OTHER): Payer: 59 | Admitting: Family Medicine

## 2021-12-03 VITALS — BP 115/79 | HR 72 | Temp 97.4°F | Ht 62.5 in | Wt 133.0 lb

## 2021-12-03 DIAGNOSIS — E7800 Pure hypercholesterolemia, unspecified: Secondary | ICD-10-CM

## 2021-12-03 DIAGNOSIS — K760 Fatty (change of) liver, not elsewhere classified: Secondary | ICD-10-CM | POA: Diagnosis not present

## 2021-12-03 DIAGNOSIS — I519 Heart disease, unspecified: Secondary | ICD-10-CM | POA: Diagnosis not present

## 2021-12-03 DIAGNOSIS — E669 Obesity, unspecified: Secondary | ICD-10-CM | POA: Diagnosis not present

## 2021-12-03 DIAGNOSIS — Z6824 Body mass index (BMI) 24.0-24.9, adult: Secondary | ICD-10-CM

## 2021-12-03 MED ORDER — OZEMPIC (2 MG/DOSE) 8 MG/3ML ~~LOC~~ SOPN: 2.0000 mg | PEN_INJECTOR | SUBCUTANEOUS | 0 refills | Status: DC

## 2021-12-03 NOTE — Telephone Encounter (Signed)
Humira has been approved for 12/03/21-03/03/22.

## 2021-12-03 NOTE — Telephone Encounter (Signed)
Prior Auth initiated with [employer] pharmacy plan.  Case # [id number].  Will take 4-15 calendar days for a response.  They advised me their clinical team will reach out with additional questions.

## 2021-12-07 NOTE — Progress Notes (Signed)
Chief Complaint:   OBESITY Melissa Cohen is here to discuss her progress with her obesity treatment plan along with follow-up of her obesity related diagnoses. See Medical Weight Management Flowsheet for complete bioelectrical impedance results.  Today's visit was #: 21 Starting weight: 196 lbs Starting date: 04/08/2020 Weight change since last visit: 4 lbs Total lbs lost to date: 63 lbs Total weight loss percentage to date: -32.14%  Nutrition Plan: Practicing portion control and making smarter food choices, such as increasing vegetables and decreasing simple carbohydrates for 0% of the time. Activity: Walking for 30 minutes 5 times per week.   Interim History: Melissa Cohen has been taking 1.5 mg Ozempic (decreased from 2 mg).  She says she has been feeling more hungry.  Assessment/Plan:   1. Cardiac complication Diastolic dysfunction.  Plan:  Increase Ozempic to 2 mg subcutaneously weekly, as per below.  - Increase Semaglutide, 2 MG/DOSE, (OZEMPIC, 2 MG/DOSE,) 8 MG/3ML SOPN; Inject 2 mg into the skin once a week.  Dispense: 9 mL; Refill: 0  2. Fatty liver Intensive lifestyle modifications are the first line treatment for this issue. We discussed several lifestyle modifications today and she will continue to work on diet, exercise and weight loss efforts.   3. Pure hypercholesterolemia Course: Uncontrolled.  Lipid-lowering medications: None.  Strong family history of heart disease.   Plan: Dietary changes: Increase soluble fiber, decrease simple carbohydrates, decrease saturated fat. Exercise changes: Moderate to vigorous-intensity aerobic activity 150 minutes per week or as tolerated. We will continue to monitor along with PCP/specialists as it pertains to her weight loss journey.  Will order Cardiac CT.  Lab Results  Component Value Date   CHOL 213 (H) 11/12/2021   HDL 70.60 11/12/2021   LDLCALC 122 (H) 11/12/2021   LDLDIRECT 104.1 12/07/2011   TRIG 100.0 11/12/2021   CHOLHDL 3  11/12/2021   Lab Results  Component Value Date   ALT 12 11/12/2021   AST 16 11/12/2021   ALKPHOS 54 11/12/2021   BILITOT 0.6 11/12/2021   The 10-year ASCVD risk score (Arnett DK, et al., 2019) is: 1.7%   Values used to calculate the score:     Age: 56 years     Sex: Female     Is Non-Hispanic African American: No     Diabetic: No     Tobacco smoker: No     Systolic Blood Pressure: 664 mmHg     Is BP treated: No     HDL Cholesterol: 70.6 mg/dL     Total Cholesterol: 213 mg/dL  - CT CARDIAC SCORING ([facility] LOCATIONS ONLY); Future  4. Obesity, current BMI 24.1  Course: Melissa Cohen is currently in the action stage of change. As such, her goal is to continue with weight loss efforts.   Nutrition goals: She has agreed to practicing portion control and making smarter food choices, such as increasing vegetables and decreasing simple carbohydrates.   Exercise goals:  As is.  Behavioral modification strategies: increasing lean protein intake, decreasing simple carbohydrates, and increasing vegetables.  Melissa Cohen has agreed to follow-up with our clinic in 4-8 weeks. She was informed of the importance of frequent follow-up visits to maximize her success with intensive lifestyle modifications for her multiple health conditions.   Objective:   Blood pressure 115/79, pulse 72, temperature (!) 97.4 F (36.3 C), temperature source Oral, height 5' 2.5" (1.588 m), weight 133 lb (60.3 kg), last menstrual period 09/02/2013, SpO2 100 %. Body mass index is 23.94 kg/m.  General: Cooperative, alert,  well developed, in no acute distress. HEENT: Conjunctivae and lids unremarkable. Cardiovascular: Regular rhythm.  Lungs: Normal work of breathing. Neurologic: No focal deficits.   Lab Results  Component Value Date   CREATININE 1.02 11/12/2021   BUN 12 11/12/2021   NA 140 11/12/2021   K 4.0 11/12/2021   CL 102 11/12/2021   CO2 29 11/12/2021   Lab Results  Component Value Date   ALT 12 11/12/2021    AST 16 11/12/2021   ALKPHOS 54 11/12/2021   BILITOT 0.6 11/12/2021   Lab Results  Component Value Date   HGBA1C 5.2 04/08/2020   Lab Results  Component Value Date   INSULIN 19.6 04/08/2020   Lab Results  Component Value Date   TSH 2.13 11/12/2021   Lab Results  Component Value Date   CHOL 213 (H) 11/12/2021   HDL 70.60 11/12/2021   LDLCALC 122 (H) 11/12/2021   LDLDIRECT 104.1 12/07/2011   TRIG 100.0 11/12/2021   CHOLHDL 3 11/12/2021   Lab Results  Component Value Date   VD25OH 86.75 11/12/2021   VD25OH 57.72 09/19/2020   VD25OH 61.0 04/08/2020   Lab Results  Component Value Date   WBC 6.1 11/12/2021   HGB 15.4 (H) 11/12/2021   HCT 45.3 11/12/2021   MCV 91.5 11/12/2021   PLT 297.0 11/12/2021   Lab Results  Component Value Date   IRON 91 07/17/2021   TIBC 364.0 07/17/2021   FERRITIN 76.1 07/17/2021   Attestation Statements:   Reviewed by clinician on day of visit: allergies, medications, problem list, medical history, surgical history, family history, social history, and previous encounter notes.  I, Water quality scientist, CMA, am acting as transcriptionist for Briscoe Deutscher, DO  I have reviewed the above documentation for accuracy and completeness, and I agree with the above. -  Briscoe Deutscher, DO, MS, FAAFP, DABOM - Family and Bariatric Medicine.

## 2021-12-14 ENCOUNTER — Encounter (INDEPENDENT_AMBULATORY_CARE_PROVIDER_SITE_OTHER): Payer: Self-pay | Admitting: Family Medicine

## 2021-12-15 ENCOUNTER — Other Ambulatory Visit (INDEPENDENT_AMBULATORY_CARE_PROVIDER_SITE_OTHER): Payer: Self-pay

## 2021-12-15 ENCOUNTER — Encounter (INDEPENDENT_AMBULATORY_CARE_PROVIDER_SITE_OTHER): Payer: Self-pay | Admitting: Family Medicine

## 2021-12-15 ENCOUNTER — Ambulatory Visit: Payer: 59

## 2021-12-15 DIAGNOSIS — F5081 Binge eating disorder: Secondary | ICD-10-CM

## 2021-12-15 DIAGNOSIS — E8881 Metabolic syndrome: Secondary | ICD-10-CM

## 2021-12-15 NOTE — Telephone Encounter (Signed)
Dr.Wallace °

## 2021-12-15 NOTE — Telephone Encounter (Signed)
LAST APPOINTMENT DATE: 12/03/21 NEXT APPOINTMENT DATE: 02/09/22  No Pharmacies Listed Patient is requesting a refill of the following medications: Requested Prescriptions   Pending Prescriptions Disp Refills   lisdexamfetamine (VYVANSE) 30 MG capsule 30 capsule 0    Sig: Take 1 capsule (30 mg total) by mouth daily.    Date last filled: 11/17/21 Previously prescribed by Dr Juleen China  Lab Results  Component Value Date   HGBA1C 5.2 04/08/2020   Lab Results  Component Value Date   LDLCALC 122 (H) 11/12/2021   CREATININE 1.02 11/12/2021   Lab Results  Component Value Date   VD25OH 86.75 11/12/2021   VD25OH 57.72 09/19/2020   VD25OH 61.0 04/08/2020    BP Readings from Last 3 Encounters:  12/03/21 115/79  11/12/21 114/74  09/29/21 109/75

## 2021-12-17 MED ORDER — VICTOZA 18 MG/3ML ~~LOC~~ SOPN: 1.2000 mg | PEN_INJECTOR | Freq: Every day | SUBCUTANEOUS | 0 refills | Status: DC

## 2021-12-21 MED ORDER — LISDEXAMFETAMINE DIMESYLATE 30 MG PO CAPS: 30.0000 mg | ORAL_CAPSULE | Freq: Every day | ORAL | 0 refills | Status: DC

## 2021-12-30 ENCOUNTER — Telehealth (INDEPENDENT_AMBULATORY_CARE_PROVIDER_SITE_OTHER): Payer: Self-pay

## 2021-12-30 NOTE — Telephone Encounter (Signed)
Pt called in and stated that she would like a call back from [place]. The pt has concerns about Victoza. Please advise

## 2021-12-31 NOTE — Telephone Encounter (Signed)
Pt is requesting an appeal to be done.  Victoza denied due to pt not having DX of type 2 DM

## 2022-01-20 ENCOUNTER — Other Ambulatory Visit: Payer: Self-pay | Admitting: Family Medicine

## 2022-01-20 ENCOUNTER — Other Ambulatory Visit: Payer: Self-pay | Admitting: Gastroenterology

## 2022-01-26 ENCOUNTER — Ambulatory Visit: Payer: 59

## 2022-02-09 ENCOUNTER — Other Ambulatory Visit: Payer: Self-pay | Admitting: Family Medicine

## 2022-02-09 ENCOUNTER — Other Ambulatory Visit: Payer: Self-pay

## 2022-02-09 ENCOUNTER — Encounter (INDEPENDENT_AMBULATORY_CARE_PROVIDER_SITE_OTHER): Payer: Self-pay | Admitting: Family Medicine

## 2022-02-09 ENCOUNTER — Ambulatory Visit (INDEPENDENT_AMBULATORY_CARE_PROVIDER_SITE_OTHER): Payer: 59 | Admitting: Family Medicine

## 2022-02-09 VITALS — BP 111/76 | HR 73 | Temp 97.9°F | Ht 62.5 in | Wt 136.0 lb

## 2022-02-09 DIAGNOSIS — E88819 Insulin resistance, unspecified: Secondary | ICD-10-CM

## 2022-02-09 DIAGNOSIS — F39 Unspecified mood [affective] disorder: Secondary | ICD-10-CM

## 2022-02-09 DIAGNOSIS — F5081 Binge eating disorder: Secondary | ICD-10-CM

## 2022-02-09 DIAGNOSIS — R632 Polyphagia: Secondary | ICD-10-CM

## 2022-02-09 DIAGNOSIS — F3289 Other specified depressive episodes: Secondary | ICD-10-CM

## 2022-02-09 DIAGNOSIS — Z6824 Body mass index (BMI) 24.0-24.9, adult: Secondary | ICD-10-CM

## 2022-02-09 DIAGNOSIS — E669 Obesity, unspecified: Secondary | ICD-10-CM

## 2022-02-09 DIAGNOSIS — E8881 Metabolic syndrome: Secondary | ICD-10-CM | POA: Diagnosis not present

## 2022-02-10 LAB — INSULIN, RANDOM: INSULIN: 9.5 u[IU]/mL (ref 2.6–24.9)

## 2022-02-10 LAB — HEMOGLOBIN A1C
Est. average glucose Bld gHb Est-mCnc: 88 mg/dL
Hgb A1c MFr Bld: 4.7 % — ABNORMAL LOW (ref 4.8–5.6)

## 2022-02-10 MED ORDER — VYVANSE 40 MG PO CHEW: 40.0000 mg | CHEWABLE_TABLET | ORAL | 0 refills | Status: DC

## 2022-02-11 ENCOUNTER — Other Ambulatory Visit: Payer: Self-pay

## 2022-02-11 NOTE — Progress Notes (Signed)
Chief Complaint:   OBESITY Melissa Cohen is here to discuss her progress with her obesity treatment plan along with follow-up of her obesity related diagnoses. See Medical Weight Management Flowsheet for complete bioelectrical impedance results.  Today's visit was #: 22 Starting weight: 196 lbs Starting date: 04/08/2020 Weight change since last visit: +3 lbs Total lbs lost to date: 60 lbs Total weight loss percentage to date: -30.61%  Nutrition Plan: Practicing portion control and making smarter food choices, such as increasing vegetables and decreasing simple carbohydrates for 0% of the time. Activity: Increased walking.  Assessment/Plan:   1. Insulin resistance Goal is HgbA1c < 5.7, fasting insulin closer to 5.  Medication: None.    Plan:  She will continue to focus on protein-rich, low simple carbohydrate foods. We reviewed the importance of hydration, regular exercise for stress reduction, and restorative sleep.  Will check labs today.  Lab Results  Component Value Date   HGBA1C 4.7 (L) 02/09/2022   Lab Results  Component Value Date   INSULIN 9.5 02/09/2022   INSULIN 19.6 04/08/2020   - Hemoglobin A1c - Insulin, random  2. Polyphagia Not at goal. Current treatment: None.  She has had a difficult time getting a GLP-1RA due to no AOM coverage and no DM2.    Plan: She will continue to focus on protein-rich, low simple carbohydrate foods. We reviewed the importance of hydration, regular exercise for stress reduction, and restorative sleep.  3. Binge eating disorder [name] is taking Vyvanse 30 mg daily for BED.  Plan:  Increase Vyvanse to 40 mg daily.  The goals for treatment of BED are to reduce eating binges and to achieve healthy eating habits. Because binge eating can correlate with negative emotions, treatment may also address any other mental-health issues, such as depression. People who binge eat feel as if they don't have control over how much they eat and have  feelings of guilt or self-loathing after a binge eating episode.  The FDA has approved Vyvanse as a treatment option for binge eating disorder. Vyvanse targets the brain's reward center by increasing the levels of dopamine and norepinephrine, the chemicals of the brain responsible for feelings of pleasure.  Mindful eating is the recommended nutritional approach to treating BED.   - Increase Lisdexamfetamine Dimesylate (VYVANSE) 40 MG CHEW; Chew 40 mg by mouth every morning.  Dispense: 30 tablet; Refill: 0  I have consulted the [place] Controlled Substances Registry for this patient, and feel the risk/benefit ratio today is favorable for proceeding with this prescription for a controlled substance. The patient understands monitoring parameters and red flags.   4. Other depression, with emotional eating [name] will look into Psychologist for behavioral modification counseling.  5. Obesity, current BMI 24.6  Course: [name] is currently in the action stage of change. As such, her goal is to continue with weight loss efforts.   Nutrition goals: She has agreed to practicing portion control and making smarter food choices, such as increasing vegetables and decreasing simple carbohydrates.   Exercise goals:  As is.  Behavioral modification strategies: increasing lean protein intake, decreasing simple carbohydrates, and increasing vegetables.  [name] has agreed to follow-up with our clinic in 6 weeks. She was informed of the importance of frequent follow-up visits to maximize her success with intensive lifestyle modifications for her multiple health conditions.   Objective:   Blood pressure 111/76, pulse 73, temperature 97.9 F (36.6 C), temperature source Oral, height 5' 2.5" (1.588 m), weight 136 lb (61.7 kg),  last menstrual period 09/02/2013, SpO2 97 %. Body mass index is 24.48 kg/m.  General: Cooperative, alert, well developed, in no acute distress. HEENT: Conjunctivae and lids  unremarkable. Cardiovascular: Regular rhythm.  Lungs: Normal work of breathing. Neurologic: No focal deficits.   Lab Results  Component Value Date   CREATININE 1.02 11/12/2021   BUN 12 11/12/2021   NA 140 11/12/2021   K 4.0 11/12/2021   CL 102 11/12/2021   CO2 29 11/12/2021   Lab Results  Component Value Date   ALT 12 11/12/2021   AST 16 11/12/2021   ALKPHOS 54 11/12/2021   BILITOT 0.6 11/12/2021   Lab Results  Component Value Date   HGBA1C 4.7 (L) 02/09/2022   HGBA1C 5.2 04/08/2020   Lab Results  Component Value Date   INSULIN 9.5 02/09/2022   INSULIN 19.6 04/08/2020   Lab Results  Component Value Date   TSH 2.13 11/12/2021   Lab Results  Component Value Date   CHOL 213 (H) 11/12/2021   HDL 70.60 11/12/2021   LDLCALC 122 (H) 11/12/2021   LDLDIRECT 104.1 12/07/2011   TRIG 100.0 11/12/2021   CHOLHDL 3 11/12/2021   Lab Results  Component Value Date   VD25OH 86.75 11/12/2021   VD25OH 57.72 09/19/2020   VD25OH 61.0 04/08/2020   Lab Results  Component Value Date   WBC 6.1 11/12/2021   HGB 15.4 (H) 11/12/2021   HCT 45.3 11/12/2021   MCV 91.5 11/12/2021   PLT 297.0 11/12/2021   Lab Results  Component Value Date   IRON 91 07/17/2021   TIBC 364.0 07/17/2021   FERRITIN 76.1 07/17/2021   Attestation Statements:   Reviewed by clinician on day of visit: allergies, medications, problem list, medical history, surgical history, family history, social history, and previous encounter notes.  I, Water quality scientist, CMA, am acting as transcriptionist for Briscoe Deutscher, DO  I have reviewed the above documentation for accuracy and completeness, and I agree with the above. -  Briscoe Deutscher, DO, MS, FAAFP, DABOM - Family and Bariatric Medicine.

## 2022-02-15 ENCOUNTER — Other Ambulatory Visit: Payer: Self-pay | Admitting: Family Medicine

## 2022-02-15 DIAGNOSIS — F39 Unspecified mood [affective] disorder: Secondary | ICD-10-CM

## 2022-02-18 ENCOUNTER — Other Ambulatory Visit: Payer: Self-pay

## 2022-02-24 ENCOUNTER — Other Ambulatory Visit (INDEPENDENT_AMBULATORY_CARE_PROVIDER_SITE_OTHER): Payer: Self-pay | Admitting: Family Medicine

## 2022-02-24 ENCOUNTER — Other Ambulatory Visit: Payer: Self-pay | Admitting: Gastroenterology

## 2022-03-09 ENCOUNTER — Ambulatory Visit: Payer: 59

## 2022-03-10 ENCOUNTER — Ambulatory Visit
Admission: RE | Admit: 2022-03-10 | Discharge: 2022-03-10 | Disposition: A | Discharge status: Home / Self Care | Payer: No Typology Code available for payment source | Source: Ambulatory Visit | Attending: Family Medicine | Admitting: Family Medicine

## 2022-03-10 DIAGNOSIS — E7800 Pure hypercholesterolemia, unspecified: Secondary | ICD-10-CM

## 2022-03-11 ENCOUNTER — Telehealth: Payer: Self-pay

## 2022-03-11 NOTE — Telephone Encounter (Signed)
PA sent in for humira.  ?

## 2022-03-15 ENCOUNTER — Encounter: Payer: Self-pay | Admitting: Family Medicine

## 2022-03-16 ENCOUNTER — Telehealth: Payer: Self-pay | Admitting: Family Medicine

## 2022-03-16 NOTE — Telephone Encounter (Signed)
chief Complaint Cough ?Reason for Call Symptomatic / Request for Health Information ?Initial Comment Caller states they tested positive for covid. Sent a ?message to doctor for medication. On medication ?that compromises immune system. Had covid a ?few years ago. Needs a documentation for travel ?insurance. Sore throat, post nasal drip, cough, ?fatigue ?Translation No ?Nurse Assessment ?Nurse: Mariea Clonts, RN, Estill Bamberg Date/Time (Eastern Time): 03/15/2022 5:23:40 PM ?Confirm and document reason for call. If ?symptomatic, describe symptoms. ?---Caller states they tested positive for Covid today. ?Needs a documentation for travel insurance because ?going on cruise in 2 days. Has a Sore throat, post nasal ?drip, cough, fatigue started today. Denies any other ?symptoms. Sent a message to doctor for medication. Is ?wanting to know about Paxlovid. ?Does the patient have any new or worsening ?symptoms? ---Yes ?Will a triage be completed? ---Yes ?Related visit to physician within the last 2 weeks? ---No ?Does the PT have any chronic conditions? (i.e. ?diabetes, asthma, this includes High risk factors for ?pregnancy, etc.) ?---No ?Is this a behavioral health or substance abuse call? ---No ?PLEASE NOTE: All timestamps contained within this report are represented as [place] Standard Time. ?CONFIDENTIALTY NOTICE: This fax transmission is intended only for the addressee. It contains information that is legally privileged, confidential or ?otherwise protected from use or disclosure. If you are not the intended recipient, you are strictly prohibited from reviewing, disclosing, copying using ?or disseminating any of this information or taking any action in reliance on or regarding this information. If you have received this fax in error, please ?notify us immediately by telephone so that we can arrange for its return to [place]. Phone: [phone], Toll-Free: [phone], Fax: [phone] ?Page: 2 of 2 ?Call Id:  [id number] ?Guidelines ?Guideline Title Affirmed Question Affirmed Notes Nurse Date/Time (Eastern ?Time) ?COVID-19 - ?Diagnosed or ?Suspected ?[1] [id number] ?diagnosed by ?positive lab test ?(e.g., PCR, rapid ?self-test kit) AND ?[2] mild symptoms ?(e.g., cough, fever, ?others) AND [4] no ?complications or ?SOB ?Mariea Clonts, RN, ?Estill Bamberg ?03/15/2022 5:29:03 ?PM ?Disp. Time (Eastern ?Time) Disposition Final User ?03/15/2022 5:38:08 PM Home Care Yes Mariea Clonts, RN, Estill Bamberg ?Caller Disagree/Comply Comply ?Caller Understands Yes ?PreDisposition Call Doctor ?Care Advice Given Per Guideline ?HOME CARE: * You should be able to treat this at home. GENERAL CARE ADVICE FOR COVID-19 SYMPTOMS: * Cough: ?Use cough drops. * Feeling dehydrated: Drink extra liquids. If the air in your home is dry, use a humidifier. * Fever: For fever over ?101 F (38.3 C), take acetaminophen every 4 to 6 hours (Adults 650 mg) OR ibuprofen every 6 to 8 hours (Adults 400 mg). Before ?taking any medicine, read all the instructions on the package. Do not take aspirin unless your doctor has prescribed it for you. CALL ?BACK IF: * Fever over 103 F (39.4 C) * Fever lasts over 3 days * Chest pain or difficulty breathing occurs * You become worse ?CARE ADVICE given per COVID-19 - DIAGNOSED OR SUSPECTED (Adult) guideline. ?

## 2022-03-24 ENCOUNTER — Other Ambulatory Visit: Payer: Self-pay | Admitting: Family Medicine

## 2022-03-24 ENCOUNTER — Other Ambulatory Visit (HOSPITAL_BASED_OUTPATIENT_CLINIC_OR_DEPARTMENT_OTHER): Payer: Self-pay | Admitting: Family Medicine

## 2022-03-24 DIAGNOSIS — Z1231 Encounter for screening mammogram for malignant neoplasm of breast: Secondary | ICD-10-CM

## 2022-03-25 ENCOUNTER — Encounter (INDEPENDENT_AMBULATORY_CARE_PROVIDER_SITE_OTHER): Payer: Self-pay | Admitting: Family Medicine

## 2022-03-25 ENCOUNTER — Telehealth (INDEPENDENT_AMBULATORY_CARE_PROVIDER_SITE_OTHER): Payer: 59 | Admitting: Family Medicine

## 2022-03-25 DIAGNOSIS — Z6824 Body mass index (BMI) 24.0-24.9, adult: Secondary | ICD-10-CM

## 2022-03-25 DIAGNOSIS — E66811 Obesity, class 1: Secondary | ICD-10-CM

## 2022-03-25 DIAGNOSIS — R7303 Prediabetes: Secondary | ICD-10-CM | POA: Diagnosis not present

## 2022-03-25 DIAGNOSIS — E669 Obesity, unspecified: Secondary | ICD-10-CM | POA: Diagnosis not present

## 2022-03-25 DIAGNOSIS — K7689 Other specified diseases of liver: Secondary | ICD-10-CM | POA: Diagnosis not present

## 2022-03-30 ENCOUNTER — Encounter (HOSPITAL_BASED_OUTPATIENT_CLINIC_OR_DEPARTMENT_OTHER): Payer: Self-pay

## 2022-03-30 ENCOUNTER — Ambulatory Visit (HOSPITAL_BASED_OUTPATIENT_CLINIC_OR_DEPARTMENT_OTHER)
Admission: RE | Admit: 2022-03-30 | Discharge: 2022-03-30 | Disposition: A | Discharge status: Home / Self Care | Payer: 59 | Source: Ambulatory Visit | Attending: Family Medicine | Admitting: Family Medicine

## 2022-03-30 ENCOUNTER — Other Ambulatory Visit (HOSPITAL_BASED_OUTPATIENT_CLINIC_OR_DEPARTMENT_OTHER): Payer: Self-pay | Admitting: Family Medicine

## 2022-03-30 DIAGNOSIS — Z1231 Encounter for screening mammogram for malignant neoplasm of breast: Secondary | ICD-10-CM

## 2022-04-05 ENCOUNTER — Other Ambulatory Visit: Payer: Self-pay

## 2022-04-05 ENCOUNTER — Encounter: Payer: Self-pay | Admitting: Gastroenterology

## 2022-04-05 ENCOUNTER — Ambulatory Visit
Admission: RE | Admit: 2022-04-05 | Discharge: 2022-04-05 | Disposition: A | Discharge status: Home / Self Care | Payer: 59 | Source: Ambulatory Visit | Attending: Urgent Care | Admitting: Urgent Care

## 2022-04-05 ENCOUNTER — Telehealth: Payer: Self-pay

## 2022-04-05 VITALS — BP 107/73 | HR 86 | Temp 97.8°F | Resp 20

## 2022-04-05 DIAGNOSIS — K51919 Ulcerative colitis, unspecified with unspecified complications: Secondary | ICD-10-CM

## 2022-04-05 DIAGNOSIS — D849 Immunodeficiency, unspecified: Secondary | ICD-10-CM

## 2022-04-05 DIAGNOSIS — M542 Cervicalgia: Secondary | ICD-10-CM | POA: Diagnosis not present

## 2022-04-05 DIAGNOSIS — W57XXXA Bitten or stung by nonvenomous insect and other nonvenomous arthropods, initial encounter: Secondary | ICD-10-CM

## 2022-04-05 DIAGNOSIS — S1096XA Insect bite of unspecified part of neck, initial encounter: Secondary | ICD-10-CM | POA: Diagnosis not present

## 2022-04-05 DIAGNOSIS — K769 Liver disease, unspecified: Secondary | ICD-10-CM

## 2022-04-05 MED ORDER — DOXYCYCLINE HYCLATE 100 MG PO CAPS
100.0000 mg | ORAL_CAPSULE | Freq: Two times a day (BID) | ORAL | 0 refills | Status: DC
Start: 2022-04-05 — End: 2022-04-12

## 2022-04-05 NOTE — ED Triage Notes (Addendum)
Pt here for tick that was self-removed on 5/3. Has had a headache since then tired.  The bite spot has formed a bump, redness and drainage.Teledoc said to go to UC for labs and abx. Tick is a lone star tick that was brought with pt.  ?

## 2022-04-05 NOTE — Telephone Encounter (Signed)
MRI orders have been placed, schedulers notified. Patient has been notified & scheduled for follow up for 04/07/22 at 3:00 pm.  ?

## 2022-04-05 NOTE — ED Provider Notes (Signed)
?[facility] ? ? ?MRN: [id number] DOB: 03/28/1966 ? ?Subjective:  ? ?Melissa Cohen is a 55 y.o. female presenting for 1 week history of persistent and progressively worsening left-sided neck pain with slight drainage.  Symptoms have been from a tick bite.  Has concerns about tickborne illness.  She did have a video visit and was advised to come get treated with antibiotics and get lab work.  Patient would like this done today.  Denies fever, joint pains, body pains, chest pain, throat pain, red eyes, coughing.  She does have a compromised immune system as she is on Humira.  Has a history of metabolic syndrome, no diabetes.  Is not taking her medications for this currently. ? ?No current facility-administered medications for this encounter. ? ?Current Outpatient Medications:  ?  Adalimumab (HUMIRA PEN) 40 MG/0.4ML PNKT, Inject 40 mg into the skin every 14 (fourteen) days. After the completion of the starter kit, Disp: 6 each, Rfl: 1 ?  B Complex-C (SUPER B COMPLEX PO), Take 1 tablet by mouth every other day., Disp: , Rfl:  ?  buPROPion (WELLBUTRIN XL) 300 MG 24 hr tablet, TAKE ONE TABLET BY MOUTH ONE TIME DAILY, Disp: 90 tablet, Rfl: 1 ?  celecoxib (CELEBREX) 200 MG capsule, TAKE ONE CAPSULE BY MOUTH TWICE A DAY, Disp: 180 capsule, Rfl: 0 ?  Cholecalciferol (VITAMIN D3) 125 MCG (5000 UT) CAPS, Take 1 capsule by mouth daily., Disp: , Rfl:  ?  cyclobenzaprine (FLEXERIL) 10 MG tablet, Take 1 tablet (10 mg total) by mouth 3 (three) times daily as needed for muscle spasms., Disp: 30 tablet, Rfl: 0 ?  esomeprazole (NEXIUM) 40 MG capsule, TAKE ONE CAPSULE BY MOUTH ONE TIME DAILY, Disp: 90 capsule, Rfl: 3 ?  famotidine (PEPCID) 40 MG tablet, TAKE ONE TABLET BY MOUTH ONE TIME DAILY, Disp: 90 tablet, Rfl: 3 ?  gabapentin (NEURONTIN) 300 MG capsule, Take 3 capsules (900 mg total) by mouth at bedtime., Disp: 270 capsule, Rfl: 0 ?  Insulin Pen Needle 32G X 4 MM MISC, 1 each by Does not apply route  daily., Disp: 100 each, Rfl: 0 ?  liraglutide (VICTOZA) 18 MG/3ML SOPN, Inject 1.2 mg into the skin daily. (Patient not taking: Reported on 03/25/2022), Disp: 18 mL, Rfl: 0 ?  Lisdexamfetamine Dimesylate (VYVANSE) 40 MG CHEW, Chew 40 mg by mouth every morning., Disp: 30 tablet, Rfl: 0 ?  ondansetron (ZOFRAN-ODT) 4 MG disintegrating tablet, PLACE ONE TABLET ON THE TONGUE EVERY 6 HOURS AS NEEDED FOR NAUSEA AND VOMITING, Disp: 20 tablet, Rfl: 0 ?  Probiotic Product (PROBIOTIC DAILY PO), Take 1 capsule by mouth daily., Disp: , Rfl:  ?  RESTASIS 0.05 % ophthalmic emulsion, , Disp: , Rfl:  ?  scopolamine (TRANSDERM-SCOP, 1.5 MG,) 1 MG/3DAYS, Place 1 patch (1.5 mg total) onto the skin every 3 (three) days., Disp: 10 patch, Rfl: 12  ? ?Allergies  ?Allergen Reactions  ? Tagamet [Cimetidine] Anaphylaxis  ?  REACTION: anaphalactic shock  ? Effexor [Venlafaxine]   ?  Swelling ?  ? Mobic [Meloxicam] Swelling  ? Ultram [Tramadol] Swelling  ? ? ?Past Medical History:  ?Diagnosis Date  ? Allergy   ? Anemia   ? Anxiety   ? Arthritis   ? Osteo arthritis - bil knees  ? Asthma   ? as a young adult related to allergy flare  ? Atypical nevi 08/25/2018  ? 1. LEFT MID BACK (MILD) - NO TX, 2. RIGHT CHEST (SEVERE) - W/S  ?   Atypical nevi 09/14/2018  ? MID BACK (SEVERE) - W/S  ? Atypical nevi 11/13/2018  ? LEFT POST. NECK INCIDENTRAL MOLE (MILD)  ? Atypical nevi 11/27/2018  ? RIGHT CHEST + MARGIN  ? Atypical nevus 01/02/2018  ? LEFT UPPER BACK (SEVERE) - W/S  ? Atypical nevus 01/04/2019  ? LEFT STERNUM ( MODERATE)  ? Atypical nevus 01/04/2019  ? LEFT POST NECK INF. (SEVERE)  ? Atypical nevus 01/04/2019  ? RECURRENT MID BACK - [facility] DR. PAVLIS  ? Back pain   ? Basal cell carcinoma 06/06/2008  ? LEFT UPPER SHOULDER PROX. @ [facility]  ? Blood transfusion without reported diagnosis   ? from [facility] several  ? Chest pain   ? Depression   ? Fatty liver   ? Gallbladder problem   ? Gastric ulcer   ? GERD (gastroesophageal reflux disease)    ? Heart murmur   ? Hyperlipidemia   ? Hypertension   ? Iron deficiency anemia   ? Joint pain   ? Knee pain   ? Lower extremity edema   ? Melanoma (HCC)   ? right cleavage, posterior left shoulder  ? MM (malignant melanoma of skin) (HCC) 09/14/2018  ? LEFT POST. NECK MIS - EXC  ? MM (malignant melanoma of skin) (HCC) 09/14/2018  ? RIGHT CHEST MIS - EXC  ? Obesity   ? Restless leg syndrome   ? Seizures (HCC)   ? 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 Shortness of breath on exertion   ? Sleep apnea   ? sleep study showed mild sleep apnea - no c-papa needed  ? Ulcerative colitis   ? Dx at age 17; had been 30 years since her last flareup and then had a flareup recently in summer of 2017. Was then started back on Remicade and is doing well since.  ? Vitamin D deficiency   ?  ? ?Past Surgical History:  ?Procedure Laterality Date  ? APPENDECTOMY    ? BREAST ENHANCEMENT SURGERY  2004  ? CHOLECYSTECTOMY  2011  ? COLONOSCOPY  2019  ? right eye lasix    ? 02/17/12  ? TONSILLECTOMY AND ADENOIDECTOMY    ? TUBAL LIGATION    ? UPPER GASTROINTESTINAL ENDOSCOPY    ? Uterine ablation    ? WISDOM TOOTH EXTRACTION    ? ? ?Family History  ?Problem Relation Age of Onset  ? Prostate cancer Father   ? Hyperlipidemia Father   ? Barrett's esophagus Father   ? Cancer Father   ?     prostate  ? Colon cancer Father 73  ?     appendical adenocarcinoma 2011  ? Hypertension Father   ? Diabetes Sister   ? Diabetes Other   ?     Grandparents  ? Heart attack Maternal Grandfather   ? Heart disease Maternal Grandfather   ? Colitis Maternal Grandfather   ? Ulcerative colitis Mother   ? Hypertension Mother   ? Hyperlipidemia Mother   ? Depression Mother   ? Colon polyps Mother   ? Ulcerative colitis Maternal Grandmother   ? Crohn's disease Brother   ? Esophageal cancer Neg Hx   ? Rectal cancer Neg Hx   ? Stomach cancer Neg Hx   ? Allergic rhinitis Neg Hx   ? Angioedema Neg Hx   ? Asthma Neg Hx   ? Atopy Neg Hx   ? Eczema Neg Hx   ?  Immunodeficiency Neg Hx   ? ? ?  Social History  ? ?Tobacco Use  ? Smoking status: Former  ?  Packs/day: 0.50  ?  Years: 25.00  ?  Pack years: 12.50  ?  Types: Cigarettes  ?  Quit date: 02/27/2006  ?  Years since quitting: 16.1  ? Smokeless tobacco: Never  ?Vaping Use  ? Vaping Use: Never used  ?Substance Use Topics  ? Alcohol use: Yes  ?  Alcohol/week: 3.0 standard drinks  ?  Types: 3 Glasses of wine per week  ?  Comment: every other weekend socially  ? Drug use: No  ? ? ?ROS ? ? ?Objective:  ? ?Vitals: ?BP 107/73   Pulse 86   Temp 97.8 ?F (36.6 ?C)   Resp 20   LMP 09/02/2013   SpO2 98%  ? ?Physical Exam ?Constitutional:   ?   General: She is not in acute distress. ?   Appearance: Normal appearance. She is well-developed. She is not ill-appearing, toxic-appearing or diaphoretic.  ?HENT:  ?   Head: Normocephalic and atraumatic.  ?   Nose: Nose normal.  ?   Mouth/Throat:  ?   Mouth: Mucous membranes are moist.  ?Eyes:  ?   General: No scleral icterus.    ?   Right eye: No discharge.     ?   Left eye: No discharge.  ?   Extraocular Movements: Extraocular movements intact.  ?Neck:  ? ?Cardiovascular:  ?   Rate and Rhythm: Normal rate.  ?Pulmonary:  ?   Effort: Pulmonary effort is normal.  ?Skin: ?   General: Skin is warm and dry.  ?Neurological:  ?   General: No focal deficit present.  ?   Mental Status: She is alert and oriented to person, place, and time.  ?Psychiatric:     ?   Mood and Affect: Mood normal.     ?   Behavior: Behavior normal.  ? ? ?Assessment and Plan :  ? ?PDMP not reviewed this encounter. ? ?1. Neck pain   ?2. Tick bite of neck, initial encounter   ?3. Immunocompromised (HCC)   ?4. Ulcerative colitis with complication, unspecified location (HCC)   ? ?Labs pending, offered empiric treatment for tickborne illness with possible developing cellulitis/abscess.  Area is not amenable to incision and drainage currently.  She is immunocompromise given her use of Humira and therefore this would be the best  approach.  Recommend supportive care otherwise.  We will follow-up with the lab results once they are available. Counseled patient on potential for adverse effects with medications prescribed/recommended today, ER and ret

## 2022-04-06 NOTE — Telephone Encounter (Signed)
Appointment cancelled for tomorrow. Patient will call to reschedule once she has completed MRI.  ?

## 2022-04-07 ENCOUNTER — Ambulatory Visit: Payer: 59 | Admitting: Gastroenterology

## 2022-04-07 LAB — COMPREHENSIVE METABOLIC PANEL
ALT: 21 IU/L (ref 0–32)
AST: 23 IU/L (ref 0–40)
Albumin/Globulin Ratio: 1.7 (ref 1.2–2.2)
Albumin: 3.8 g/dL (ref 3.8–4.9)
Alkaline Phosphatase: 61 IU/L (ref 44–121)
BUN/Creatinine Ratio: 14 (ref 9–23)
BUN: 12 mg/dL (ref 6–24)
Bilirubin Total: 0.2 mg/dL (ref 0.0–1.2)
CO2: 23 mmol/L (ref 20–29)
Calcium: 9.1 mg/dL (ref 8.7–10.2)
Chloride: 106 mmol/L (ref 96–106)
Creatinine, Ser: 0.87 mg/dL (ref 0.57–1.00)
Globulin, Total: 2.3 g/dL (ref 1.5–4.5)
Glucose: 79 mg/dL (ref 70–99)
Potassium: 4.1 mmol/L (ref 3.5–5.2)
Sodium: 143 mmol/L (ref 134–144)
Total Protein: 6.1 g/dL (ref 6.0–8.5)
eGFR: 78 mL/min/{1.73_m2} (ref >59)

## 2022-04-07 LAB — CBC WITH DIFFERENTIAL/PLATELET
Basophils Absolute: 0.1 10*3/uL (ref 0.0–0.2)
Basos: 1 %
EOS (ABSOLUTE): 0.1 10*3/uL (ref 0.0–0.4)
Eos: 2 %
Hematocrit: 42.4 % (ref 34.0–46.6)
Hemoglobin: 14.1 g/dL (ref 11.1–15.9)
Immature Grans (Abs): 0 10*3/uL (ref 0.0–0.1)
Immature Granulocytes: 0 %
Lymphocytes Absolute: 2 10*3/uL (ref 0.7–3.1)
Lymphs: 35 %
MCH: 30.8 pg (ref 26.6–33.0)
MCHC: 33.3 g/dL (ref 31.5–35.7)
MCV: 93 fL (ref 79–97)
Monocytes Absolute: 0.5 10*3/uL (ref 0.1–0.9)
Monocytes: 9 %
Neutrophils Absolute: 3 10*3/uL (ref 1.4–7.0)
Neutrophils: 53 %
Platelets: 258 10*3/uL (ref 150–450)
RBC: 4.58 x10E6/uL (ref 3.77–5.28)
RDW: 12.3 % (ref 11.7–15.4)
WBC: 5.6 10*3/uL (ref 3.4–10.8)

## 2022-04-07 LAB — EHRLICHIA ANTIBODY PANEL
E. Chaffeensis (HME) IgM Titer: NEGATIVE
E.Chaffeensis (HME) IgG: NEGATIVE
HGE IgG Titer: NEGATIVE
HGE IgM Titer: NEGATIVE

## 2022-04-07 LAB — LYME DISEASE SEROLOGY W/REFLEX: Lyme Total Antibody EIA: NEGATIVE

## 2022-04-07 LAB — ROCKY MTN SPOTTED FVR ABS PNL(IGG+IGM)
RMSF IgG: POSITIVE — AB
RMSF IgM: 1.17 index — ABNORMAL HIGH (ref 0.00–0.89)

## 2022-04-07 LAB — RMSF, IGG, IFA: RMSF, IGG, IFA: <1:64 {titer}

## 2022-04-08 ENCOUNTER — Telehealth: Payer: Self-pay | Admitting: Family Medicine

## 2022-04-08 NOTE — Telephone Encounter (Signed)
Left pt a VM asking to call office in regards to her message this morning  ?

## 2022-04-08 NOTE — Telephone Encounter (Signed)
Pt called in stating that she was seen at the UC on Monday, she has tested positive for [facility] mt spotted fever. She wanted to make sure she was on the correct medication. She also states that she has a continuing headache. ? ? ?Please advise   ? ?Records are in the system  ? ?

## 2022-04-08 NOTE — Progress Notes (Signed)
TeleHealth Visit:  Due to the COVID-19 pandemic, this visit was completed with telemedicine (audio/video) technology to reduce patient and provider exposure as well as to preserve personal protective equipment.   Melissa Cohen has verbally consented to this TeleHealth visit. The patient is located at home, the provider is located at the [facility] office. The participants in this visit include the listed provider and patient. The visit was conducted today via MyChart video.   Chief Complaint: OBESITY Melissa Cohen is here to discuss her progress with her obesity treatment plan along with follow-up of her obesity related diagnoses. Melissa Cohen is on practicing portion control and making smarter food choices, such as increasing vegetables and decreasing simple carbohydrates and states she is following her eating plan approximately 0% of the time. Melissa Cohen states she is doing 0 minutes 0 times per week.  Today's visit was #: 23 Starting weight: 196 lbs Starting date: 04/08/2020  Interim History: Melissa Cohen is working on maintaining her weight loss. She has gained back approximately 10 lbs of her weight, but is doing well maintaining at that weight. She continues to work on increasing her protein and she is trying to get 60-80 grams of protein daily.    Subjective:   1. Liver cyst Melissa Cohen had a cardiac CT scan which shows a small liver cyst. She is concerned about this, especially with her family history of GI cancer. Dr. Fuller Plan is her gastroenterologist. She requests a referral to see if further evaluation is warranted. She denies abdominal pain, and LFTs were within normal limits.   2. Pre-diabetes Melissa Cohen has been on Ozempic, but her insurance will no longer cover unless she has the diabetes mellitus diagnosis. Her insurance will not cover any other GLP-1 either. She continues to work on her diet and exercise to help prevent diabetes mellitus.   Assessment/Plan:   1. Liver cyst We will refer to  Dr. Fuller Plan at [facility] for evaluation.   - Ambulatory referral to Gastroenterology  2. Pre-diabetes Melissa Cohen was encouraged to continue as is and she plans to continue her care with Dr. Juleen China at [facility].   3. Obesity, current BMI 24.6 Melissa Cohen is currently in the action stage of change. As such, her goal is to continue with weight loss efforts. She has agreed to practicing portion control and making smarter food choices, such as increasing vegetables and decreasing simple carbohydrates.   Behavioral modification strategies: increasing lean protein intake and better snacking choices.  Melissa Cohen has agreed to follow-up with our clinic in 0 weeks, as she will be changing to [facility]. She was informed of the importance of frequent follow-up visits to maximize her success with intensive lifestyle modifications for her multiple health conditions.  Objective:   VITALS: Per patient if applicable, see vitals. GENERAL: Alert and in no acute distress. CARDIOPULMONARY: No increased WOB. Speaking in clear sentences.  PSYCH: Pleasant and cooperative. Speech normal rate and rhythm. Affect is appropriate. Insight and judgement are appropriate. Attention is focused, linear, and appropriate.  NEURO: Oriented as arrived to appointment on time with no prompting.   Lab Results  Component Value Date   CREATININE 0.87 04/05/2022   BUN 12 04/05/2022   NA 143 04/05/2022   K 4.1 04/05/2022   CL 106 04/05/2022   CO2 23 04/05/2022   Lab Results  Component Value Date   ALT 21 04/05/2022   AST 23 04/05/2022   ALKPHOS 61 04/05/2022   BILITOT 0.2 04/05/2022   Lab Results  Component Value  Date   HGBA1C 4.7 (L) 02/09/2022   HGBA1C 5.2 04/08/2020   Lab Results  Component Value Date   INSULIN 9.5 02/09/2022   INSULIN 19.6 04/08/2020   Lab Results  Component Value Date   TSH 2.13 11/12/2021   Lab Results  Component Value Date   CHOL 213 (H) 11/12/2021   HDL 70.60 11/12/2021   LDLCALC 122 (H)  11/12/2021   LDLDIRECT 104.1 12/07/2011   TRIG 100.0 11/12/2021   CHOLHDL 3 11/12/2021   Lab Results  Component Value Date   VD25OH 86.75 11/12/2021   VD25OH 57.72 09/19/2020   VD25OH 61.0 04/08/2020   Lab Results  Component Value Date   WBC 5.6 04/05/2022   HGB 14.1 04/05/2022   HCT 42.4 04/05/2022   MCV 93 04/05/2022   PLT 258 04/05/2022   Lab Results  Component Value Date   IRON 91 07/17/2021   TIBC 364.0 07/17/2021   FERRITIN 76.1 07/17/2021    Attestation Statements:   Reviewed by clinician on day of visit: allergies, medications, problem list, medical history, surgical history, family history, social history, and previous encounter notes.   I, Trixie Dredge, am acting as transcriptionist for Dennard Nip, MD.  I have reviewed the above documentation for accuracy and completeness, and I agree with the above. - Dennard Nip, MD

## 2022-04-09 NOTE — Telephone Encounter (Signed)
Pt informed and made tentative appt for Thursday next week for follow up  ?

## 2022-04-09 NOTE — Telephone Encounter (Signed)
Pt was given Doxycycline 1 tablet 2 times daily on 04/05/22 should pt follow up with you given ED visit/ ? ?

## 2022-04-09 NOTE — Telephone Encounter (Signed)
Doxycycline is definitely the appropriate drug.  Lots of fluids, rest, tylenol or ibuprofen for headache. ?

## 2022-04-12 ENCOUNTER — Ambulatory Visit (INDEPENDENT_AMBULATORY_CARE_PROVIDER_SITE_OTHER): Payer: 59 | Admitting: Family Medicine

## 2022-04-12 ENCOUNTER — Encounter: Payer: Self-pay | Admitting: Family Medicine

## 2022-04-12 VITALS — BP 110/70 | HR 81 | Temp 97.8°F | Resp 16 | Wt 142.4 lb

## 2022-04-12 DIAGNOSIS — A770 Spotted fever due to Rickettsia rickettsii: Secondary | ICD-10-CM

## 2022-04-12 MED ORDER — DOXYCYCLINE HYCLATE 100 MG PO CAPS: 100.0000 mg | ORAL_CAPSULE | Freq: Two times a day (BID) | ORAL | 0 refills | Status: DC

## 2022-04-12 NOTE — Progress Notes (Signed)
? ?  Subjective:  ? ? Patient ID: Melissa Cohen, female    DOB: Feb 08, 1966, 56 y.o.   MRN: [id number] ? ?HPI ?RMSF- pt found tick the night of 5/3.  Pt thinks it was likely there since 5/1.  Developed a headache on 5/4 that persisted through the weekend.  Was started on Doxycycline on 5/8.  Since then has had persistent headache, tingling of tongue, facial pain.  'it feels like there's something going on in my neurological system'.  Finished last Doxycycline this AM.  No documented fever throughout this.  No rash. ? ? ?Review of Systems ?For ROS see HPI  ?   ?Objective:  ? Physical Exam ?Vitals reviewed.  ?Constitutional:   ?   General: She is not in acute distress. ?   Appearance: Normal appearance. She is well-developed. She is not ill-appearing.  ?HENT:  ?   Head: Normocephalic and atraumatic.  ?   Right Ear: Tympanic membrane and ear canal normal.  ?   Left Ear: Tympanic membrane and ear canal normal.  ?Eyes:  ?   Conjunctiva/sclera: Conjunctivae normal.  ?   Pupils: Pupils are equal, round, and reactive to light.  ?Cardiovascular:  ?   Rate and Rhythm: Normal rate and regular rhythm.  ?   Heart sounds: Normal heart sounds.  ?Pulmonary:  ?   Effort: Pulmonary effort is normal. No respiratory distress.  ?   Breath sounds: Normal breath sounds. No wheezing or rales.  ?Musculoskeletal:     ?   General: No tenderness.  ?   Cervical back: Normal range of motion and neck supple.  ?Lymphadenopathy:  ?   Cervical: No cervical adenopathy.  ?Skin: ?   General: Skin is warm and dry.  ?Neurological:  ?   General: No focal deficit present.  ?   Mental Status: She is alert and oriented to person, place, and time.  ?   Cranial Nerves: No cranial nerve deficit.  ?   Coordination: Coordination normal.  ?   Deep Tendon Reflexes: Reflexes are normal and symmetric.  ?Psychiatric:     ?   Behavior: Behavior normal.     ?   Thought Content: Thought content normal.     ?   Judgment: Judgment normal.  ? ? ? ? ? ?   ?Assessment & Plan:   ?RMSF- new.  Pt had + test after presenting to ER w/ known tick bite, severe headache, tingling of face and tongue.  She was treated w/ 7 days of Doxy but given her immunosuppressed state, I worry that this is not enough.  Particularly in the setting of persistent HA.  Add additional 7 days of Doxy.  Encouraged fluids, rest, and Tylenol/Ibuprofen.  If no improvement or if sxs worsen, she is to let me know or go to ER if sxs are severe.  Pt expressed understanding and is in agreement w/ plan.  ? ?

## 2022-04-12 NOTE — Patient Instructions (Signed)
Follow up as needed or as scheduled ?CONTINUE the Doxycycline twice daily for another 7 days ?Drink LOTS of water to help w/ headache ?Tylenol or ibuprofen as needed ?Call with any questions or concerns ?Hang on there!!! ?

## 2022-04-14 ENCOUNTER — Ambulatory Visit (HOSPITAL_COMMUNITY): Payer: 59

## 2022-04-15 ENCOUNTER — Ambulatory Visit: Payer: 59 | Admitting: Family Medicine

## 2022-04-15 ENCOUNTER — Ambulatory Visit (HOSPITAL_BASED_OUTPATIENT_CLINIC_OR_DEPARTMENT_OTHER)
Admission: RE | Admit: 2022-04-15 | Discharge: 2022-04-15 | Disposition: A | Discharge status: Home / Self Care | Payer: 59 | Source: Ambulatory Visit | Attending: Gastroenterology | Admitting: Gastroenterology

## 2022-04-15 DIAGNOSIS — K769 Liver disease, unspecified: Secondary | ICD-10-CM | POA: Insufficient documentation

## 2022-04-15 DIAGNOSIS — R932 Abnormal findings on diagnostic imaging of liver and biliary tract: Secondary | ICD-10-CM | POA: Diagnosis not present

## 2022-04-15 MED ORDER — GADOBUTROL 1 MMOL/ML IV SOLN
6.4000 mL | Freq: Once | INTRAVENOUS | Status: AC | PRN
  Administered 2022-04-15: 6.4 mL via INTRAVENOUS
  Filled 2022-04-15: qty 7.5

## 2022-04-16 ENCOUNTER — Encounter: Payer: Self-pay | Admitting: Gastroenterology

## 2022-04-23 ENCOUNTER — Ambulatory Visit (HOSPITAL_COMMUNITY): Payer: 59

## 2022-05-18 ENCOUNTER — Other Ambulatory Visit: Payer: Self-pay | Admitting: Gastroenterology

## 2022-06-23 ENCOUNTER — Other Ambulatory Visit: Payer: Self-pay | Admitting: Family Medicine

## 2022-07-04 ENCOUNTER — Other Ambulatory Visit: Payer: Self-pay | Admitting: Family Medicine

## 2022-07-07 ENCOUNTER — Encounter (INDEPENDENT_AMBULATORY_CARE_PROVIDER_SITE_OTHER): Payer: Self-pay

## 2022-07-19 ENCOUNTER — Other Ambulatory Visit: Payer: Self-pay | Admitting: Gastroenterology

## 2022-07-28 ENCOUNTER — Other Ambulatory Visit: Payer: Self-pay | Admitting: Family Medicine

## 2022-07-28 DIAGNOSIS — F39 Unspecified mood [affective] disorder: Secondary | ICD-10-CM

## 2022-08-10 DIAGNOSIS — E7800 Pure hypercholesterolemia, unspecified: Secondary | ICD-10-CM | POA: Diagnosis not present

## 2022-08-10 DIAGNOSIS — Z6827 Body mass index (BMI) 27.0-27.9, adult: Secondary | ICD-10-CM | POA: Diagnosis not present

## 2022-08-10 DIAGNOSIS — Z8639 Personal history of other endocrine, nutritional and metabolic disease: Secondary | ICD-10-CM | POA: Diagnosis not present

## 2022-08-10 DIAGNOSIS — E663 Overweight: Secondary | ICD-10-CM | POA: Diagnosis not present

## 2022-08-10 DIAGNOSIS — K760 Fatty (change of) liver, not elsewhere classified: Secondary | ICD-10-CM | POA: Diagnosis not present

## 2022-08-10 DIAGNOSIS — R69 Illness, unspecified: Secondary | ICD-10-CM | POA: Diagnosis not present

## 2022-08-17 ENCOUNTER — Telehealth: Payer: Self-pay | Admitting: Gastroenterology

## 2022-08-17 NOTE — Telephone Encounter (Signed)
Pharmacy called pertaining to Humira pen RX. A new prescription needs to be either resent via fax or call to give a verbal request for the refill. Phone [phone] fax [phone]

## 2022-08-17 NOTE — Telephone Encounter (Signed)
Received a MyChart message from patient.  She made an appointment to see Dr. Fuller Plan on 10/8 at 9:10 a.m. for her yearly visit.  In the meantime, her insurance and pharmacy has changed since she was here last.  Her insurance is now [place] and [facility] [address]  [facility] [place].  She will need her Humira to be pre-authorized again with this new insurance.  Please call patient and advise.  Thank you.

## 2022-08-18 NOTE — Telephone Encounter (Signed)
[facility]. Patient has OV scheduled for 10/06/22 with Dr. Fuller Plan.

## 2022-08-20 ENCOUNTER — Other Ambulatory Visit (HOSPITAL_COMMUNITY): Payer: Self-pay

## 2022-08-23 ENCOUNTER — Telehealth: Payer: Self-pay | Admitting: Pharmacy Technician

## 2022-08-23 ENCOUNTER — Other Ambulatory Visit (HOSPITAL_COMMUNITY): Payer: Self-pay

## 2022-08-23 NOTE — Telephone Encounter (Signed)
Spoke with patient & she is aware of Humira refills and to keep OV as scheduled in November.

## 2022-08-23 NOTE — Telephone Encounter (Signed)
[facility] called back stating [facility] requires a prior auth for the Humira that was called in last week. Will check with our prior auth team.

## 2022-08-23 NOTE — Telephone Encounter (Signed)
Patient Advocate Encounter  Received notification that prior authorization for HUMIRA is required.   PA submitted on 9.25.23 Key [id number] Status is pending    Luciano Cutter, CPhT Patient Advocate Phone: [phone]

## 2022-08-24 NOTE — Telephone Encounter (Signed)
PA has been submitted for this patient and telephone encounter has been created

## 2022-09-02 ENCOUNTER — Telehealth: Payer: Self-pay | Admitting: Gastroenterology

## 2022-09-02 NOTE — Telephone Encounter (Signed)
Inbound call from [facility] states  prior authorization for HUMIRA is required.Phone number provide [phone].Please advise

## 2022-09-02 NOTE — Telephone Encounter (Signed)
[facility] stating [facility] has not been obtained yet & Rx is on hold until approved.

## 2022-09-03 ENCOUNTER — Other Ambulatory Visit: Payer: Self-pay | Admitting: Gastroenterology

## 2022-09-03 ENCOUNTER — Other Ambulatory Visit (HOSPITAL_COMMUNITY): Payer: Self-pay

## 2022-09-03 ENCOUNTER — Telehealth: Payer: Self-pay | Admitting: Pharmacy Technician

## 2022-09-03 NOTE — Telephone Encounter (Signed)
Resubmitted PA request as it has been more than 7 dayy. Called insurance and unable to push through previous request as it is "stuck". Will continue to follow.

## 2022-09-03 NOTE — Telephone Encounter (Signed)
Patient Advocate Encounter  Received notification that prior authorization for HUMIRA PEN is required.   PA submitted on 10.6.23 Key [id number] Status is pending    Luciano Cutter, CPhT Patient Advocate Phone: [phone]

## 2022-09-06 ENCOUNTER — Encounter: Payer: Self-pay | Admitting: Family Medicine

## 2022-09-06 NOTE — Telephone Encounter (Signed)
Received mychart message from patient stating: "Humira prior authorization Was never received from [facility] and has been CANCELLED !   Now they are having to start all over  I am Almost out of medicine.'  Routing to [place] to see if there is an update on prior auth that is currently pending.

## 2022-09-07 ENCOUNTER — Ambulatory Visit (INDEPENDENT_AMBULATORY_CARE_PROVIDER_SITE_OTHER): Payer: 59 | Admitting: Family Medicine

## 2022-09-07 ENCOUNTER — Encounter: Payer: Self-pay | Admitting: Family Medicine

## 2022-09-07 VITALS — BP 120/68 | HR 76 | Temp 97.4°F | Resp 17 | Ht 62.5 in | Wt 151.0 lb

## 2022-09-07 DIAGNOSIS — M79641 Pain in right hand: Secondary | ICD-10-CM | POA: Diagnosis not present

## 2022-09-07 DIAGNOSIS — M79604 Pain in right leg: Secondary | ICD-10-CM | POA: Diagnosis not present

## 2022-09-07 DIAGNOSIS — K148 Other diseases of tongue: Secondary | ICD-10-CM

## 2022-09-07 DIAGNOSIS — R8290 Unspecified abnormal findings in urine: Secondary | ICD-10-CM | POA: Diagnosis not present

## 2022-09-07 DIAGNOSIS — M79642 Pain in left hand: Secondary | ICD-10-CM | POA: Diagnosis not present

## 2022-09-07 DIAGNOSIS — R768 Other specified abnormal immunological findings in serum: Secondary | ICD-10-CM

## 2022-09-07 DIAGNOSIS — M79605 Pain in left leg: Secondary | ICD-10-CM | POA: Diagnosis not present

## 2022-09-07 LAB — BASIC METABOLIC PANEL
BUN: 9 mg/dL (ref 6–23)
CO2: 28 mEq/L (ref 19–32)
Calcium: 9.1 mg/dL (ref 8.4–10.5)
Chloride: 104 mEq/L (ref 96–112)
Creatinine, Ser: 0.74 mg/dL (ref 0.40–1.20)
GFR: 89.32 mL/min (ref >60.00)
Glucose, Bld: 78 mg/dL (ref 70–99)
Potassium: 4 mEq/L (ref 3.5–5.1)
Sodium: 139 mEq/L (ref 135–145)

## 2022-09-07 LAB — CBC WITH DIFFERENTIAL/PLATELET
Basophils Absolute: 0 10*3/uL (ref 0.0–0.1)
Basophils Relative: 0.9 % (ref 0.0–3.0)
Eosinophils Absolute: 0.3 10*3/uL (ref 0.0–0.7)
Eosinophils Relative: 6.3 % — ABNORMAL HIGH (ref 0.0–5.0)
HCT: 42.7 % (ref 36.0–46.0)
Hemoglobin: 14.3 g/dL (ref 12.0–15.0)
Lymphocytes Relative: 34.7 % (ref 12.0–46.0)
Lymphs Abs: 1.8 10*3/uL (ref 0.7–4.0)
MCHC: 33.6 g/dL (ref 30.0–36.0)
MCV: 88.2 fl (ref 78.0–100.0)
Monocytes Absolute: 0.5 10*3/uL (ref 0.1–1.0)
Monocytes Relative: 8.8 % (ref 3.0–12.0)
Neutro Abs: 2.6 10*3/uL (ref 1.4–7.7)
Neutrophils Relative %: 49.3 % (ref 43.0–77.0)
Platelets: 244 10*3/uL (ref 150.0–400.0)
RBC: 4.85 Mil/uL (ref 3.87–5.11)
RDW: 13.9 % (ref 11.5–15.5)
WBC: 5.3 10*3/uL (ref 4.0–10.5)

## 2022-09-07 LAB — POCT URINALYSIS DIPSTICK
Glucose, UA: NEGATIVE
Leukocytes, UA: NEGATIVE
Protein, UA: NEGATIVE
Spec Grav, UA: 1.02 (ref 1.010–1.025)
Urobilinogen, UA: 0.2 E.U./dL
pH, UA: 7 (ref 5.0–8.0)

## 2022-09-07 LAB — HEPATIC FUNCTION PANEL
ALT: 28 U/L (ref 0–35)
AST: 39 U/L — ABNORMAL HIGH (ref 0–37)
Albumin: 3.6 g/dL (ref 3.5–5.2)
Alkaline Phosphatase: 60 U/L (ref 39–117)
Bilirubin, Direct: 0.1 mg/dL (ref 0.0–0.3)
Total Bilirubin: 0.4 mg/dL (ref 0.2–1.2)
Total Protein: 7.4 g/dL (ref 6.0–8.3)

## 2022-09-07 LAB — TSH: TSH: 1.97 u[IU]/mL (ref 0.35–5.50)

## 2022-09-07 LAB — CK: Total CK: 47 U/L (ref 7–177)

## 2022-09-07 LAB — SEDIMENTATION RATE: Sed Rate: 18 mm/hr (ref 0–30)

## 2022-09-07 NOTE — Telephone Encounter (Signed)
I have manually faxed the PA request for this medication. Not sure why it is not registering in the system, as I have submitted via CoverMyMeds twice now.

## 2022-09-07 NOTE — Progress Notes (Signed)
Subjective:    Patient ID: Melissa Cohen, female    DOB: Oct 17, 1966, 56 y.o.   MRN: [id number]  HPI Hand pain- bilateral.  sxs started ~3 weeks ago.  Pt reports grip strength is weak, painful to make a fist.  Wrist has limited ROM both flexion and extension.  Pain will radiate up both arms.  Sxs started suddenly.  Bilateral leg pain- pt reports calf and thigh pain.  Described as an ache.  No known injuries.  No recent med changes.  No recent vaccines.    'thick tongued'- pt feels that there is something over here (pointing to R side of mouth).  Sxs started this week.  Not slurring speech.  No trouble swalowing   Review of Systems For ROS see HPI     Objective:   Physical Exam Vitals reviewed.  Constitutional:      General: She is not in acute distress.    Appearance: She is not ill-appearing.  HENT:     Head: Normocephalic and atraumatic.     Mouth/Throat:     Mouth: Mucous membranes are moist.     Pharynx: Oropharynx is clear.  Eyes:     Extraocular Movements: Extraocular movements intact.     Conjunctiva/sclera: Conjunctivae normal.     Pupils: Pupils are equal, round, and reactive to light.  Neck:     Thyroid: No thyroid mass, thyromegaly or thyroid tenderness.  Musculoskeletal:        General: Swelling (swelling of MCP joints bilaterally) present.     Right wrist: Tenderness present. No deformity or crepitus. Decreased range of motion. Normal pulse.     Left wrist: Tenderness present. No deformity or crepitus. Decreased range of motion. Normal pulse.     Right hand: Swelling present. Decreased strength (grip) of wrist extension.     Left hand: Swelling present. Decreased strength (grip) of wrist extension.     Cervical back: Normal range of motion and neck supple. No tenderness.     Right upper leg: No swelling or tenderness.     Left upper leg: No swelling or tenderness.     Right lower leg: No swelling or tenderness.     Left lower leg: No swelling or tenderness.      Comments: Pt is not able to extend either wrist without fingers reflexively curling Limited flexion bilaterally  Lymphadenopathy:     Cervical: No cervical adenopathy.  Skin:    General: Skin is warm and dry.  Neurological:     General: No focal deficit present.     Mental Status: She is alert and oriented to person, place, and time.     Cranial Nerves: No cranial nerve deficit.     Coordination: Coordination normal.     Gait: Gait normal.  Psychiatric:        Behavior: Behavior normal.        Thought Content: Thought content normal.     Comments: Flat affect           Assessment & Plan:   Bilateral hand pain- new.  Pt reports this started suddenly 3-4 weeks ago.  States she has decreased ROM of both wrists and is unable to extend her wrist w/o her fingers reflexively curling.  Feels grip is weak and it is painful to make a fist.  MCP joints are mildly swollen.  She is already on Celebrex daily.  Will check labs to r/o rheumatologic/autoimmune process and in the meantime, refer to Hand Specialist.  Pt expressed understanding and is in agreement w/ plan.   Bilateral leg pain- new.  Pt reports this is more of an ache than the bony pain she is experiencing in her hands/wrists/forearms.  Says calves and thighs will 'ache' and feel heavy- 'like I exercised but I didn't'.  Again, check labs.  Pt may need Rheumatology referral based on results.  Pt expressed understanding and is in agreement w/ plan.   Thick tongue- new.  Thankfully pt's face is symmetric and there are no cranial nerve deficits.  Speech is normal today when compared to previous visits.

## 2022-09-07 NOTE — Patient Instructions (Addendum)
We'll notify you of your lab results and determine the next steps We'll call you to schedule your hand appt Make sure you are drinking LOTS of water to help w/ cramping Try and do gentle stretching to avoid stiffness Call with any questions or concerns Hang in there!!!

## 2022-09-08 ENCOUNTER — Telehealth: Payer: Self-pay | Admitting: Family Medicine

## 2022-09-08 ENCOUNTER — Telehealth: Payer: Self-pay

## 2022-09-08 DIAGNOSIS — D721 Eosinophilia, unspecified: Secondary | ICD-10-CM

## 2022-09-08 LAB — VITAMIN D 25 HYDROXY (VIT D DEFICIENCY, FRACTURES): VITD: 96.86 ng/mL (ref 30.00–100.00)

## 2022-09-08 NOTE — Telephone Encounter (Signed)
Patient stated that the referral issue for a hand specialists has been resolved, She can not see Dr Amedeo Plenty but can see his associate next week.

## 2022-09-08 NOTE — Telephone Encounter (Signed)
I have spoke with pt and advised of Dr Virgil Benedict. Message . She is not wanting to listen to me explain to her about the eosinophils she states we need to stop saying it is related to allergies . I have explained to  her that Danae Chen is trying to get her in with the hand specialist . She wants to know what Dr Birdie Riddle will do next once the ANA comes back and if is   normal ?   She is now wanting [place] to send her blood work for someone else to look at the Eosinophils . She states she does have an auto immune disorder . She states she feels as if Dr Birdie Riddle is not doing enough to find out why her hands are cramping . I tried to reassure her Dr Randel Books is taking all the neccessary test to find out the cause

## 2022-09-08 NOTE — Telephone Encounter (Signed)
Caller name: Catrice Zuleta   On DPR? :yes/no: Yes  Call back number: [phone]  Provider they see: Birdie Riddle   Reason for call: Pt called to speak with a nurse about her lab results.

## 2022-09-08 NOTE — Telephone Encounter (Signed)
Pt wants to see Dr Annie Main, noted he is booked till December, pt does not wish to see anyone else and feels you didn't explain the severity of her pains and other associated sxs well enough and that is why he cannot see her sooner and if nothing can be done she will go to their urgent care.   Please advise any actions we can take on this though it seems patient has a plan in mind

## 2022-09-08 NOTE — Telephone Encounter (Signed)
Pt's AST was just slightly above range.  Liver enzymes tend to fluctuate w/ alcohol and medications.  But this is nothing to worry about.  We tend not to look into elevations in the AST or ALT until they are in the 60s or higher  The eosinophils are the allergy white blood cells.  We have seen these elevated in almost all patients recently due to exceptionally high pollen and ragweed counts.  The relative # (which is what is elevated) is a guesstimate they make based on what they see in a microscopic field (% of cells).  But if you look at the absolute number- which is normal- this is an actual cell count.  Again, this is nothing to worry about

## 2022-09-08 NOTE — Telephone Encounter (Signed)
I have called and spoke to patient and reiterated what Dr Birdie Riddle states about the eosinophils. Patient states that there are more reasons than just allergies and it seems like Dr Birdie Riddle has tunnel vision and is only focusing on that. I did explain to her about the absolute and estimated value. She states that her levels have never been this high any other time she's had her blood work done. She wanted to know what next steps were. I let her know that if she wanted it to be rechecked we could have her come in and redraw the lab or Dr Birdie Riddle could place a referral to hematology. Patient agreed to the referral. She inquired about the ANA results, I let her know that we didn't have those as of yet and that we would be calling her when we received those.

## 2022-09-08 NOTE — Telephone Encounter (Signed)
My notes were very clear on the symptoms she described to me and the ortho office requests these notes when referrals are made.  I cannot free up a spot on Dr Maryanna Shape schedule as he is one of the most in-demand doctors in the area (which is why they now have 3 other hand specialists)  I would recommend her seeing one of the other hand specialists b/c if she goes to the [facility] Ur[facility] she will most likely see a PA and not an MD.

## 2022-09-08 NOTE — Telephone Encounter (Signed)
Pt called and is stating she is concerned that her Eosinophils is high . She is questioning if Dr Birdie Riddle need's to closer into this. I tried to reassure her that if Dr Randel Books thought it was abnormal or anything concerning she would have mentioned it . Pt is not accepting that answer . Can you please address ?

## 2022-09-08 NOTE — Telephone Encounter (Signed)
Called office spoke to Melissa Cohen she states that Dr. Amedeo Plenty is booked out until Dec. She states that she sent him a message to see if he would be willing to work the pt in sooner then Dec. She also said there are 3 other hand specialist at their office that could see her before Dec.   I have spoke to the pt making her aware of this, she states that she doesn't want to see anyone else but Dr. Amedeo Plenty and if he doesn't ok her to be seen sooner she will go to their urgent care to be evaluated. She also states that it it more to it then just hand pain. She states her hand are cramping. This is also going up her arms. She states that she doesn't feel like Dr. Birdie Riddle explained that. She wanted to know what I told the office and I told her that I told her the DX of Bilateral hand pain. She states you don't read the notes. I advised pt that I don't because I'm not clinical and that is not in my need to know in order to route the referral.

## 2022-09-08 NOTE — Progress Notes (Signed)
Pt seen results Via my chart

## 2022-09-08 NOTE — Telephone Encounter (Signed)
I have sent a message to Dr Birdie Riddle

## 2022-09-08 NOTE — Telephone Encounter (Signed)
I have placed a hematology referral for them to do any necessary additional evaluation for her elevated eosinophils.  I hate that she feels that we are not doing enough on her behalf, but she was just here yesterday and we have ordered labs and now 2 referrals.  If she truly feels that we are falling short, I would encourage her to get a 2nd opinion or seek care elsewhere as there seem to be trust issues that can impact the doctor/patient relationship

## 2022-09-09 ENCOUNTER — Encounter: Payer: Self-pay | Admitting: Sports Medicine

## 2022-09-09 ENCOUNTER — Ambulatory Visit (INDEPENDENT_AMBULATORY_CARE_PROVIDER_SITE_OTHER): Payer: 59

## 2022-09-09 ENCOUNTER — Ambulatory Visit: Payer: 59 | Admitting: Sports Medicine

## 2022-09-09 VITALS — BP 118/82 | HR 75 | Ht 62.0 in | Wt 151.0 lb

## 2022-09-09 DIAGNOSIS — K760 Fatty (change of) liver, not elsewhere classified: Secondary | ICD-10-CM | POA: Diagnosis not present

## 2022-09-09 DIAGNOSIS — R252 Cramp and spasm: Secondary | ICD-10-CM

## 2022-09-09 DIAGNOSIS — R29898 Other symptoms and signs involving the musculoskeletal system: Secondary | ICD-10-CM

## 2022-09-09 DIAGNOSIS — Z8639 Personal history of other endocrine, nutritional and metabolic disease: Secondary | ICD-10-CM | POA: Diagnosis not present

## 2022-09-09 DIAGNOSIS — M79631 Pain in right forearm: Secondary | ICD-10-CM | POA: Diagnosis not present

## 2022-09-09 DIAGNOSIS — M4312 Spondylolisthesis, cervical region: Secondary | ICD-10-CM | POA: Diagnosis not present

## 2022-09-09 DIAGNOSIS — M2550 Pain in unspecified joint: Secondary | ICD-10-CM | POA: Diagnosis not present

## 2022-09-09 DIAGNOSIS — M79601 Pain in right arm: Secondary | ICD-10-CM

## 2022-09-09 DIAGNOSIS — M542 Cervicalgia: Secondary | ICD-10-CM | POA: Diagnosis not present

## 2022-09-09 DIAGNOSIS — M79632 Pain in left forearm: Secondary | ICD-10-CM | POA: Diagnosis not present

## 2022-09-09 DIAGNOSIS — M79602 Pain in left arm: Secondary | ICD-10-CM

## 2022-09-09 DIAGNOSIS — M79641 Pain in right hand: Secondary | ICD-10-CM | POA: Diagnosis not present

## 2022-09-09 DIAGNOSIS — Z6826 Body mass index (BMI) 26.0-26.9, adult: Secondary | ICD-10-CM | POA: Diagnosis not present

## 2022-09-09 DIAGNOSIS — M79642 Pain in left hand: Secondary | ICD-10-CM | POA: Diagnosis not present

## 2022-09-09 LAB — VITAMIN D 25 HYDROXY (VIT D DEFICIENCY, FRACTURES): VITD: 87.72 ng/mL (ref 30.00–100.00)

## 2022-09-09 LAB — ANA: Anti Nuclear Antibody (ANA): POSITIVE — AB

## 2022-09-09 LAB — RHEUMATOID FACTOR: Rheumatoid fact SerPl-aCnc: <14 IU/mL (ref <14)

## 2022-09-09 LAB — SEDIMENTATION RATE: Sed Rate: 25 mm/hr (ref 0–30)

## 2022-09-09 LAB — HEMOGLOBIN A1C: Hgb A1c MFr Bld: 5.1 % (ref 4.6–6.5)

## 2022-09-09 LAB — ANTI-NUCLEAR AB-TITER (ANA TITER): ANA Titer 1: 1:640 {titer} — ABNORMAL HIGH

## 2022-09-09 LAB — TSH: TSH: 2.07 u[IU]/mL (ref 0.35–5.50)

## 2022-09-09 LAB — C-REACTIVE PROTEIN: CRP: <1 mg/dL (ref 0.5–20.0)

## 2022-09-09 LAB — URIC ACID: Uric Acid, Serum: 6.1 mg/dL (ref 2.4–7.0)

## 2022-09-09 LAB — FERRITIN: Ferritin: 37.7 ng/mL (ref 10.0–291.0)

## 2022-09-09 MED ORDER — METHYLPREDNISOLONE 4 MG PO TBPK: ORAL_TABLET | ORAL | 0 refills | Status: DC

## 2022-09-09 NOTE — Progress Notes (Signed)
Melissa Cohen D.[facility]amar[facility] [place] Phone: [phone]   Assessment and Plan:     1. Bilateral arm pain 2. Right arm weakness 3. Muscle cramping 4. Polyarthralgia -Acute, complicated, initial sports medicine visit - Unclear etiology of bilateral arm soreness, cramping, pain, right hand and wrist weakness that have been present for 3 to 4 weeks.  Also accompanied by bilateral calf cramping. - We will start work-up to investigate a broad differential.  We will obtain a C-spine x-ray at today's visit to evaluate for cervical cause of bilateral upper extremity symptoms.  We will obtain autoimmune lab work to further investigate possible autoimmune or rheumatologic cause with patient having past medical history of ulcerative colitis.  Will obtain A1c as patient has had low blood glucose and low A1c in the past and recent hearted Ozempic for weight loss 5 weeks ago, so would like to rule out hypoglycemia as a cause for patient's symptoms. - Unremarkable CBC and CMP at recent family medicine visit on 09/07/2022 - Start Medrol Dosepak - Recommend discontinuing daily Celebrex as patient has been taking this NSAID for years.  Discussed with patient side effects of taking NSAIDs chronically.  Instead recommend using Tylenol for day-to-day pain relief and Celebrex for breakthrough pain  - C-reactive protein; Future - Cyclic citrul peptide antibody, IgG; Future - Ferritin; Future - Rheumatoid factor; Future - Sedimentation rate; Future - TSH; Future - Uric acid; Future - VITAMIN D 25 Hydroxy (Vit-D Deficiency, Fractures); Future - ANA; Future - HgB A1c - DG Cervical Spine 2 or 3 views; Future    Pertinent previous records reviewed include family medicine note 09/07/2022, family medicine telephone encounter 09/08/2022, CBC and CMP 09/07/2022   Follow Up: 2 weeks for reevaluation   Subjective:   I, Melissa Cohen, am serving as  a Education administrator for Doctor Glennon Mac  Chief Complaint: bilateral forearm pain   HPI:   09/09/22 Patient is a 56 year old female complaining of bilateral forearm pain. Patient states  this started suddenly 3-4 weeks ago.  States she has decreased ROM of both wrists and is unable to extend her wrist w/o her fingers reflexively curling.  Feels grip is weak and it is painful to make a fist.  MCP joints are mildly swollen.  She is already on Celebrex daily.  Will check labs to r/o rheumatologic/autoimmune process and in the meantime, refer to Hand Specialist.  no   moi , she cant put all her hand on her face to wash her face,  cant flatten her hand , pain is radiating up into her arms, her calves are sore , she states it feels like she worked out but she didn't , sensation started that same 3-4 weeks ago , states she has an autoimmune disease   Relevant Historical Information: Ulcerative colitis, hypertension, GERD  Additional pertinent review of systems negative.   Current Outpatient Medications:    B Complex-C (SUPER B COMPLEX PO), Take 1 tablet by mouth every other day., Disp: , Rfl:    Biotin 10000 MCG TABS, Take by mouth., Disp: , Rfl:    buPROPion (WELLBUTRIN XL) 300 MG 24 hr tablet, TAKE ONE TABLET BY MOUTH ONE TIME DAILY, Disp: 90 tablet, Rfl: 1   celecoxib (CELEBREX) 200 MG capsule, TAKE ONE CAPSULE BY MOUTH TWICE A DAY, Disp: 180 capsule, Rfl: 0   Cholecalciferol (VITAMIN D3) 125 MCG (5000 UT) CAPS, Take 1 capsule by mouth daily., Disp: ,  Rfl:    esomeprazole (NEXIUM) 40 MG capsule, TAKE ONE CAPSULE BY MOUTH ONE TIME DAILY, Disp: 90 capsule, Rfl: 3   famotidine (PEPCID) 40 MG tablet, TAKE ONE TABLET BY MOUTH ONE TIME DAILY, Disp: 90 tablet, Rfl: 3   gabapentin (NEURONTIN) 300 MG capsule, TAKE THREE CAPSULES BY MOUTH ONE TIME DAILY AT BEDTIME, Disp: 270 capsule, Rfl: 0   HUMIRA PEN 40 MG/0.4ML PNKT, INJECT 1 PEN  SUBCUTANEOUSLY EVERY 14 DAYS, AFTER COMPLETION OF STARTER KIT, Disp: 2 each,  Rfl: 0   Insulin Pen Needle 32G X 4 MM MISC, 1 each by Does not apply route daily., Disp: 100 each, Rfl: 0   ondansetron (ZOFRAN-ODT) 4 MG disintegrating tablet, PLACE ONE TABLET ON THE TONGUE EVERY 6 HOURS AS NEEDED FOR NAUSEA AND VOMITING, Disp: 20 tablet, Rfl: 0   Probiotic Product (PROBIOTIC DAILY PO), Take 1 capsule by mouth daily., Disp: , Rfl:    Semaglutide,0.25 or 0.5MG/DOS, (OZEMPIC, 0.25 OR 0.5 MG/DOSE,) 2 MG/1.5ML SOPN, Inject into the skin., Disp: , Rfl:    simethicone (MYLICON) 771 MG chewable tablet, Chew 125 mg by mouth every 6 (six) hours as needed for flatulence., Disp: , Rfl:    Objective:     Vitals:   09/09/22 1427  BP: 118/82  Pulse: 75  SpO2: 100%  Weight: 151 lb (68.5 kg)  Height: _0  (1.575 m)      Body mass index is 27.62 kg/m.    Physical Exam:    Cervical Spine: Posture normal Skin: normal, intact  Neurological:   Strength:  Right  Left   Deltoid 5/5 5/5  Bicep 5/5  5/5  Tricep 5/5 5/5  Wrist Flexion 4/5 5/5  Wrist Extension 4/5 5/5  Grip 4/5 5/5  Finger Abduction 4/5 5/5   Sensation: intact to light touch in upper extremities bilaterally  Spurling's:  negative bilaterally Neck ROM: Full active ROM TTP: Bilateral distal forearm NTTP: cervical spinous processes, cervical paraspinal, thoracic paraspinal, trapezius    Electronically signed by:  Melissa Cohen D.[facility] 3:04 PM 09/09/22

## 2022-09-09 NOTE — Patient Instructions (Addendum)
Good to see you  Xray on the way out Labs on the way out  2 week follow up

## 2022-09-09 NOTE — Progress Notes (Signed)
Informed pt of lab results  and advised the referral has been placed as well

## 2022-09-10 NOTE — Telephone Encounter (Signed)
Patient is already currently scheduled for f/u on 11/01/22 with Dr. Fuller Plan. Pending response from prior auth team in regards to Humira.

## 2022-09-11 ENCOUNTER — Other Ambulatory Visit: Payer: Self-pay | Admitting: Family Medicine

## 2022-09-11 LAB — CYCLIC CITRUL PEPTIDE ANTIBODY, IGG: Cyclic Citrullin Peptide Ab: <16 UNITS

## 2022-09-11 LAB — RHEUMATOID FACTOR: Rheumatoid fact SerPl-aCnc: <14 IU/mL (ref <14)

## 2022-09-11 LAB — ANTI-NUCLEAR AB-TITER (ANA TITER): ANA Titer 1: 1:1280 {titer} — ABNORMAL HIGH

## 2022-09-11 LAB — ANA: Anti Nuclear Antibody (ANA): POSITIVE — AB

## 2022-09-14 DIAGNOSIS — M79641 Pain in right hand: Secondary | ICD-10-CM | POA: Diagnosis not present

## 2022-09-14 DIAGNOSIS — S56901A Unspecified injury of unspecified muscles, fascia and tendons at forearm level, right arm, initial encounter: Secondary | ICD-10-CM | POA: Diagnosis not present

## 2022-09-14 DIAGNOSIS — M79642 Pain in left hand: Secondary | ICD-10-CM | POA: Diagnosis not present

## 2022-09-14 NOTE — Telephone Encounter (Signed)
Patient walked in and upset about her refills. Dr. Fuller Plan she is scheduled for follow up with you in December.  She asks is it safe to continue on Humira with a recent positive ANA and will see rheumatology, Dr. Kathlene November , on Thursday for possible lupus.  Okay to continue on her Humira?

## 2022-09-14 NOTE — Telephone Encounter (Signed)
OK to refill and remain on Humira for now. Keep follow up with [place] in December. She is overdue for GI follow up.  Await Dr. Teodoro Spray evaluation and advice on etiology of elevated ANA and on treatment recommended, if any, to provide further advice on Humira.

## 2022-09-15 ENCOUNTER — Other Ambulatory Visit (HOSPITAL_COMMUNITY): Payer: Self-pay

## 2022-09-15 NOTE — Telephone Encounter (Signed)
I called and spoke with Melissa Cohen about her concerns and relayed Dr. Lynne Leader response. I advised her that I will be back in touch once I hear back from pharmacy about the status of her Humira

## 2022-09-16 DIAGNOSIS — M79641 Pain in right hand: Secondary | ICD-10-CM | POA: Diagnosis not present

## 2022-09-16 DIAGNOSIS — Z79899 Other long term (current) drug therapy: Secondary | ICD-10-CM | POA: Diagnosis not present

## 2022-09-16 DIAGNOSIS — M2550 Pain in unspecified joint: Secondary | ICD-10-CM | POA: Diagnosis not present

## 2022-09-16 DIAGNOSIS — M25561 Pain in right knee: Secondary | ICD-10-CM | POA: Diagnosis not present

## 2022-09-16 DIAGNOSIS — M7989 Other specified soft tissue disorders: Secondary | ICD-10-CM | POA: Diagnosis not present

## 2022-09-16 DIAGNOSIS — R768 Other specified abnormal immunological findings in serum: Secondary | ICD-10-CM | POA: Diagnosis not present

## 2022-09-16 DIAGNOSIS — M1990 Unspecified osteoarthritis, unspecified site: Secondary | ICD-10-CM | POA: Diagnosis not present

## 2022-09-16 DIAGNOSIS — M1711 Unilateral primary osteoarthritis, right knee: Secondary | ICD-10-CM | POA: Diagnosis not present

## 2022-09-16 DIAGNOSIS — M79643 Pain in unspecified hand: Secondary | ICD-10-CM | POA: Diagnosis not present

## 2022-09-16 DIAGNOSIS — K760 Fatty (change of) liver, not elsewhere classified: Secondary | ICD-10-CM | POA: Diagnosis not present

## 2022-09-16 DIAGNOSIS — M5459 Other low back pain: Secondary | ICD-10-CM | POA: Diagnosis not present

## 2022-09-16 DIAGNOSIS — E669 Obesity, unspecified: Secondary | ICD-10-CM | POA: Diagnosis not present

## 2022-09-16 DIAGNOSIS — K5190 Ulcerative colitis, unspecified, without complications: Secondary | ICD-10-CM | POA: Diagnosis not present

## 2022-09-16 DIAGNOSIS — M79642 Pain in left hand: Secondary | ICD-10-CM | POA: Diagnosis not present

## 2022-09-16 NOTE — Telephone Encounter (Signed)
Patient has an office visit on 11/01/22.  I called and spoke with her and have offered an earlier appointment on 11/8 and 11/22.  She states she is not able to come on those days.  She will keep her appointment for 12/8. Patient not seen since 08/20/21.  She will need to be seen and evaluated in the office prior to any new notes being available.  Patient has an appointment with Rheumatology today and states she will ask Dr. Kathlene November to write her orders.  She will call back and ask Dr. Kathlene November to send notes from visit today about her elevated ANA.  We are going to reach out to the drug manufacturer to try and obtain a sample of Humira

## 2022-09-16 NOTE — Telephone Encounter (Signed)
What fax number do the chart notes need to be sent to?

## 2022-09-16 NOTE — Telephone Encounter (Signed)
Called and spoke with insurance. Will need current chart notes stating patient is in remission. Will need to send via fax before authorization can be considered. Most recent chart notes are from 07/2021 which only states discussion of Humira. Another case id ([id number]) has been assigned and will only pend for 48 hours. If chart notes are not submitted, this request will be cancelled as well.

## 2022-09-17 NOTE — Telephone Encounter (Signed)
The fax number would be [phone]. That goes to [facility] [employer] Prior Authorization.

## 2022-09-17 NOTE — Telephone Encounter (Signed)
See note below from [facility] regarding the OV notes.

## 2022-09-17 NOTE — Telephone Encounter (Signed)
I haven't received any thing in our fax as of yet, nor any new uploads from Dr Teodoro Spray office. Will continue to follow as information is provided from the office.

## 2022-09-20 DIAGNOSIS — S56901A Unspecified injury of unspecified muscles, fascia and tendons at forearm level, right arm, initial encounter: Secondary | ICD-10-CM | POA: Diagnosis not present

## 2022-09-20 DIAGNOSIS — M5136 Other intervertebral disc degeneration, lumbar region: Secondary | ICD-10-CM | POA: Diagnosis not present

## 2022-09-20 DIAGNOSIS — Z6825 Body mass index (BMI) 25.0-25.9, adult: Secondary | ICD-10-CM | POA: Diagnosis not present

## 2022-09-20 DIAGNOSIS — M24542 Contracture, left hand: Secondary | ICD-10-CM | POA: Diagnosis not present

## 2022-09-23 ENCOUNTER — Ambulatory Visit: Payer: 59 | Admitting: Sports Medicine

## 2022-09-24 ENCOUNTER — Inpatient Hospital Stay: Payer: 59 | Attending: Hematology & Oncology

## 2022-09-24 ENCOUNTER — Encounter: Payer: Self-pay | Admitting: Family

## 2022-09-24 ENCOUNTER — Inpatient Hospital Stay: Payer: 59 | Admitting: Family

## 2022-09-24 ENCOUNTER — Other Ambulatory Visit: Payer: Self-pay | Admitting: Family

## 2022-09-24 VITALS — BP 123/75 | HR 72 | Resp 17 | Ht 63.0 in | Wt 148.1 lb

## 2022-09-24 DIAGNOSIS — D721 Eosinophilia, unspecified: Secondary | ICD-10-CM | POA: Diagnosis not present

## 2022-09-24 DIAGNOSIS — Z833 Family history of diabetes mellitus: Secondary | ICD-10-CM

## 2022-09-24 DIAGNOSIS — D509 Iron deficiency anemia, unspecified: Secondary | ICD-10-CM

## 2022-09-24 DIAGNOSIS — G2581 Restless legs syndrome: Secondary | ICD-10-CM | POA: Diagnosis not present

## 2022-09-24 DIAGNOSIS — Z8042 Family history of malignant neoplasm of prostate: Secondary | ICD-10-CM | POA: Insufficient documentation

## 2022-09-24 DIAGNOSIS — E611 Iron deficiency: Secondary | ICD-10-CM | POA: Insufficient documentation

## 2022-09-24 DIAGNOSIS — Z885 Allergy status to narcotic agent status: Secondary | ICD-10-CM | POA: Insufficient documentation

## 2022-09-24 DIAGNOSIS — Z79899 Other long term (current) drug therapy: Secondary | ICD-10-CM | POA: Diagnosis not present

## 2022-09-24 DIAGNOSIS — Z800 Family history of malignant neoplasm of digestive organs: Secondary | ICD-10-CM

## 2022-09-24 DIAGNOSIS — Z8379 Family history of other diseases of the digestive system: Secondary | ICD-10-CM | POA: Insufficient documentation

## 2022-09-24 DIAGNOSIS — Z8349 Family history of other endocrine, nutritional and metabolic diseases: Secondary | ICD-10-CM | POA: Insufficient documentation

## 2022-09-24 DIAGNOSIS — R200 Anesthesia of skin: Secondary | ICD-10-CM | POA: Insufficient documentation

## 2022-09-24 DIAGNOSIS — Z9049 Acquired absence of other specified parts of digestive tract: Secondary | ICD-10-CM | POA: Insufficient documentation

## 2022-09-24 DIAGNOSIS — Z87891 Personal history of nicotine dependence: Secondary | ICD-10-CM | POA: Insufficient documentation

## 2022-09-24 DIAGNOSIS — Z888 Allergy status to other drugs, medicaments and biological substances status: Secondary | ICD-10-CM | POA: Diagnosis not present

## 2022-09-24 DIAGNOSIS — K219 Gastro-esophageal reflux disease without esophagitis: Secondary | ICD-10-CM

## 2022-09-24 DIAGNOSIS — Z8249 Family history of ischemic heart disease and other diseases of the circulatory system: Secondary | ICD-10-CM | POA: Insufficient documentation

## 2022-09-24 DIAGNOSIS — Z85828 Personal history of other malignant neoplasm of skin: Secondary | ICD-10-CM | POA: Insufficient documentation

## 2022-09-24 DIAGNOSIS — Z83711 Family history of hyperplastic colon polyps: Secondary | ICD-10-CM | POA: Diagnosis not present

## 2022-09-24 DIAGNOSIS — Z818 Family history of other mental and behavioral disorders: Secondary | ICD-10-CM | POA: Diagnosis not present

## 2022-09-24 DIAGNOSIS — Z8616 Personal history of COVID-19: Secondary | ICD-10-CM | POA: Diagnosis not present

## 2022-09-24 LAB — CMP (CANCER CENTER ONLY)
ALT: 13 U/L (ref 0–44)
AST: 13 U/L — ABNORMAL LOW (ref 15–41)
Albumin: 3.8 g/dL (ref 3.5–5.0)
Alkaline Phosphatase: 50 U/L (ref 38–126)
Anion gap: 6 (ref 5–15)
BUN: 16 mg/dL (ref 6–20)
CO2: 32 mmol/L (ref 22–32)
Calcium: 9.5 mg/dL (ref 8.9–10.3)
Chloride: 104 mmol/L (ref 98–111)
Creatinine: 0.85 mg/dL (ref 0.44–1.00)
GFR, Estimated: >60 mL/min (ref >60)
Glucose, Bld: 83 mg/dL (ref 70–99)
Potassium: 4 mmol/L (ref 3.5–5.1)
Sodium: 142 mmol/L (ref 135–145)
Total Bilirubin: 0.4 mg/dL (ref 0.3–1.2)
Total Protein: 6.7 g/dL (ref 6.5–8.1)

## 2022-09-24 LAB — CBC WITH DIFFERENTIAL (CANCER CENTER ONLY)
Abs Immature Granulocytes: 0.03 10*3/uL (ref 0.00–0.07)
Basophils Absolute: 0.1 10*3/uL (ref 0.0–0.1)
Basophils Relative: 1 %
Eosinophils Absolute: 0.1 10*3/uL (ref 0.0–0.5)
Eosinophils Relative: 1 %
HCT: 44.4 % (ref 36.0–46.0)
Hemoglobin: 14.6 g/dL (ref 12.0–15.0)
Immature Granulocytes: 0 %
Lymphocytes Relative: 35 %
Lymphs Abs: 3.8 10*3/uL (ref 0.7–4.0)
MCH: 29.9 pg (ref 26.0–34.0)
MCHC: 32.9 g/dL (ref 30.0–36.0)
MCV: 90.8 fL (ref 80.0–100.0)
Monocytes Absolute: 0.8 10*3/uL (ref 0.1–1.0)
Monocytes Relative: 7 %
Neutro Abs: 6 10*3/uL (ref 1.7–7.7)
Neutrophils Relative %: 56 %
Platelet Count: 255 10*3/uL (ref 150–400)
RBC: 4.89 MIL/uL (ref 3.87–5.11)
RDW: 13.3 % (ref 11.5–15.5)
WBC Count: 10.8 10*3/uL — ABNORMAL HIGH (ref 4.0–10.5)
nRBC: 0 % (ref 0.0–0.2)

## 2022-09-24 LAB — LACTATE DEHYDROGENASE: LDH: 148 U/L (ref 98–192)

## 2022-09-24 LAB — SAVE SMEAR(SSMR), FOR PROVIDER SLIDE REVIEW

## 2022-09-24 NOTE — Progress Notes (Signed)
Hematology/Oncology Consultation   Name: Melissa Cohen      MRN: [id number]    Location: Room/bed info not found  Date: 09/24/2022 Time:1:52 PM   REFERRING PHYSICIAN: Annye Asa, MD  REASON FOR CONSULT: Eosinophila    DIAGNOSIS:  Mild eosinophilia Iron deficiency   HISTORY OF PRESENT ILLNESS: [name] is a very pleasant 56 yo caucasian female with recent mild eosinophilia on routine lab work with PCP.  She was asymptomatic at the time. No allergy or UC flare which she states has been when her eosinophils were high in the past.  She states that she has had muscle aches for the last couple years. Over the last 2 months she has had noted a weak grip in her hands as well as trouble spreading her fingers out flat. She has had stiff triceps, calf and thigh muscles.  She has positional numbness and tingling in her arms.  No falls or syncope reported.  No swelling in her extremities at this time.  She states that she has seen both neurology and rheumatology and had a positive ANA and states that she was told she may have Lupus.  She had [facility] fever in May 2023.  She then had Covid in June 2023.  She also has history of iron deficiency.  She went through the change of life naturally about 8 years ago.  No blood loss noted. No bruising or petechiae.   She has not benefited from oral iron. She has received IV iron in the past without any complications.  No is symptomatic with fatigue and dizziness when she stands too quickly.  She was diagnosed with ulcerative colitis at 56 yo. She required a 2 month hospitalization with multiple blood transfusions during admission.  She was on Remicade for UC and is now switching over to Humira once insurance approval completed.  She has history of basal cell carcinoma of the chest removed with Mohs surgery.  Her father and paternal grandfather both had history of appendiceal.  No history of diabetes or thyroid disease.  No fever,  chills, n/v, cough, rash, SOB, chest pain, palpitations, abdominal pain or changes in bowel or bladder habits.  She quick smoking in 2007 after 25 years. No recreational drug use. ETOH socially.  Appetite and hydration are good. Weight is stable at 148. She was on Ozempic with the wellness center for 2 years.  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   ROS: All other 10 point review of systems is negative.   PAST MEDICAL HISTORY:   Past Medical History:  Diagnosis Date   Allergy    Anemia    Anxiety    Arthritis    Osteo arthritis - bil knees   Asthma    as a young adult related to allergy flare   Atypical nevi 08/25/2018   1. LEFT MID BACK (MILD) - NO TX, 2. RIGHT CHEST (SEVERE) - W/S   Atypical nevi 09/14/2018   MID BACK (SEVERE) - W/S   Atypical nevi 11/13/2018   LEFT POST. NECK INCIDENTRAL MOLE (MILD)   Atypical nevi 11/27/2018   RIGHT CHEST + MARGIN   Atypical nevus 01/02/2018   LEFT UPPER BACK (SEVERE) - W/S   Atypical nevus 01/04/2019   LEFT STERNUM ( MODERATE)   Atypical nevus 01/04/2019   LEFT POST NECK INF. (SEVERE)   Atypical nevus 01/04/2019   RECURRENT MID BACK - [facility] DR. PAVLIS   Back pain    Basal  cell carcinoma 06/06/2008   LEFT UPPER SHOULDER PROX. @ [facility]   Blood transfusion without reported diagnosis    from [facility] several   Chest pain    Depression    Fatty liver    Gallbladder problem    Gastric ulcer    GERD (gastroesophageal reflux disease)    Heart murmur    Hyperlipidemia    Hypertension    Iron deficiency anemia    Joint pain    Knee pain    Lower extremity edema    Melanoma ([place])    right cleavage, posterior left shoulder   MM (malignant melanoma of skin) ([place]) 09/14/2018   LEFT POST. 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 Was then started back on Remicade and is doing well since.   Vitamin D deficiency     ALLERGIES: Allergies  Allergen Reactions   Tagamet [Cimetidine] Anaphylaxis    REACTION: anaphalactic shock   Effexor [Venlafaxine]     Swelling    Mobic [Meloxicam] Swelling   Ultram [Tramadol] Swelling      MEDICATIONS:  Current Outpatient Medications on File Prior to Visit  Medication Sig Dispense Refill   B Complex-C (SUPER B COMPLEX PO) Take 1 tablet by mouth every other day.     Biotin 10000 MCG TABS Take by mouth.     buPROPion (WELLBUTRIN XL) 300 MG 24 hr tablet TAKE ONE TABLET BY MOUTH ONE TIME DAILY 90 tablet 1   celecoxib (CELEBREX) 200 MG capsule TAKE ONE CAPSULE BY MOUTH TWICE A DAY 180 capsule 0   Cholecalciferol (VITAMIN D3) 125 MCG (5000 UT) CAPS Take 1 capsule by mouth daily.     esomeprazole (NEXIUM) 40 MG capsule TAKE ONE CAPSULE BY MOUTH ONE TIME DAILY 90 capsule 3   famotidine (PEPCID) 40 MG tablet TAKE ONE TABLET BY MOUTH ONE TIME DAILY 90 tablet 3   gabapentin (NEURONTIN) 300 MG capsule TAKE THREE CAPSULES BY MOUTH AT BEDTIME 270 capsule 0   HUMIRA PEN 40 MG/0.4ML PNKT INJECT 1 PEN  SUBCUTANEOUSLY EVERY 14 DAYS, AFTER COMPLETION OF STARTER KIT 2 each 0   Insulin Pen Needle 32G X 4 MM MISC 1 each by Does not apply route daily. 100 each 0   methylPREDNISolone (MEDROL DOSEPAK) 4 MG TBPK tablet Per Dosepak tapering instructions 21 tablet 0   ondansetron (ZOFRAN-ODT) 4 MG disintegrating tablet PLACE ONE TABLET ON THE TONGUE EVERY 6 HOURS AS NEEDED FOR NAUSEA AND VOMITING 20 tablet 0   Probiotic Product (PROBIOTIC DAILY PO) Take 1 capsule by mouth daily.     Semaglutide,0.25 or 0.5MG/DOS, (OZEMPIC, 0.25 OR 0.5 MG/DOSE,) 2 MG/1.5ML SOPN Inject into the skin.      simethicone (MYLICON) 465 MG chewable tablet Chew 125 mg by mouth every 6 (six) hours as needed for flatulence.     No current facility-administered medications on file prior to visit.     PAST SURGICAL HISTORY Past Surgical History:  Procedure Laterality Date   APPENDECTOMY     BREAST ENHANCEMENT SURGERY  2004   CHOLECYSTECTOMY  2011   COLONOSCOPY  2019   right eye lasix     02/17/12   TONSILLECTOMY AND ADENOIDECTOMY     TUBAL LIGATION     UPPER GASTROINTESTINAL ENDOSCOPY     Uterine ablation     WISDOM TOOTH EXTRACTION      FAMILY HISTORY: Family History  Problem Relation Age of Onset   Prostate cancer Father    Hyperlipidemia Father    Barrett's esophagus Father    Cancer Father        prostate   Colon cancer Father 30       appendical adenocarcinoma 2011   Hypertension Father    Diabetes Sister    Diabetes Other        Grandparents   Heart attack Maternal Grandfather    Heart disease Maternal Grandfather    Colitis Maternal Grandfather    Ulcerative colitis Mother    Hypertension Mother    Hyperlipidemia Mother    Depression Mother    Colon polyps Mother    Ulcerative colitis Maternal Grandmother    Crohn's disease Brother    Esophageal cancer Neg Hx    Rectal cancer Neg Hx    Stomach cancer Neg Hx    Allergic rhinitis Neg Hx    Angioedema Neg Hx    Asthma Neg Hx    Atopy Neg Hx    Eczema Neg Hx    Immunodeficiency Neg Hx     SOCIAL HISTORY:  reports that she quit smoking about 16 years ago. Her smoking use included cigarettes. She has a 12.50 pack-year smoking history. She has never used smokeless tobacco. She reports current alcohol use of about 3.0 standard drinks of alcohol per week. She reports that she does not use drugs.  PERFORMANCE STATUS: The patient's performance status is 1 - Symptomatic but completely ambulatory  PHYSICAL EXAM: Most Recent Vital Signs: Blood pressure 123/75, pulse 72, resp. rate 17, height _0  (1.6 m), weight 148 lb  1.9 oz (67.2 kg), last menstrual period 09/02/2013, SpO2 100 %. BP 123/75 (BP Location: Right Arm, Patient Position: Sitting)   Pulse 72   Resp 17   Ht _1  (1.6 m)   Wt 148 lb 1.9 oz (67.2 kg)   LMP 09/02/2013   SpO2 100%   BMI 26.24 kg/m   General Appearance:    Alert, cooperative, no distress, appears stated age  Head:    Normocephalic, without obvious abnormality, atraumatic  Eyes:    PERRL, conjunctiva/corneas clear, EOM's intact, fundi    benign, both eyes        Throat:   Lips, mucosa, and tongue normal; teeth and gums normal  Neck:   Supple, symmetrical, trachea midline, no adenopathy;    thyroid:  no enlargement/tenderness/nodules; no carotid   bruit or JVD  Back:     Symmetric, no curvature, ROM normal, no CVA tenderness  Lungs:     Clear to auscultation bilaterally, respirations unlabored  Chest Wall:    No tenderness or deformity   Heart:    Regular rate and rhythm, S1 and S2 normal, no murmur, rub   or gallop     Abdomen:     Soft, non-tender, bowel sounds active all four quadrants,    no masses, no organomegaly        Extremities:   Extremities normal, atraumatic, no cyanosis or edema  Pulses:  2+ and symmetric all extremities  Skin:   Skin color, texture, turgor normal, no rashes or lesions  Lymph nodes:   Cervical, supraclavicular, and axillary nodes normal  Neurologic:   CNII-XII intact, normal strength, sensation and reflexes    throughout    LABORATORY DATA:  Results for orders placed or performed in visit on 09/24/22 (from the past 48 hour(s))  CBC with Differential ([facility] Only)     Status: Abnormal   Collection Time: 09/24/22  8:51 AM  Result Value Ref Range   WBC Count 10.8 (H) 4.0 - 10.5 K/uL   RBC 4.89 3.87 - 5.11 MIL/uL   Hemoglobin 14.6 12.0 - 15.0 g/dL   HCT 44.4 36.0 - 46.0 %   MCV 90.8 80.0 - 100.0 fL   MCH 29.9 26.0 - 34.0 pg   MCHC 32.9 30.0 - 36.0 g/dL   RDW 13.3 11.5 - 15.5 %   Platelet Count 255 150 - 400 K/uL   nRBC 0.0  0.0 - 0.2 %   Neutrophils Relative % 56 %   Neutro Abs 6.0 1.7 - 7.7 K/uL   Lymphocytes Relative 35 %   Lymphs Abs 3.8 0.7 - 4.0 K/uL   Monocytes Relative 7 %   Monocytes Absolute 0.8 0.1 - 1.0 K/uL   Eosinophils Relative 1 %   Eosinophils Absolute 0.1 0.0 - 0.5 K/uL   Basophils Relative 1 %   Basophils Absolute 0.1 0.0 - 0.1 K/uL   Immature Granulocytes 0 %   Abs Immature Granulocytes 0.03 0.00 - 0.07 K/uL    Comment: Performed at [facility] at [facility], [address] [place], [place] [address]  CMP ([place] only)     Status: Abnormal   Collection Time: 09/24/22  8:51 AM  Result Value Ref Range   Sodium 142 135 - 145 mmol/L   Potassium 4.0 3.5 - 5.1 mmol/L   Chloride 104 98 - 111 mmol/L   CO2 32 22 - 32 mmol/L   Glucose, Bld 83 70 - 99 mg/dL    Comment: Glucose reference range applies only to samples taken after fasting for at least 8 hours.   BUN 16 6 - 20 mg/dL   Creatinine 0.85 0.44 - 1.00 mg/dL   Calcium 9.5 8.9 - 10.3 mg/dL   Total Protein 6.7 6.5 - 8.1 g/dL   Albumin 3.8 3.5 - 5.0 g/dL   AST 13 (L) 15 - 41 U/L   ALT 13 0 - 44 U/L   Alkaline Phosphatase 50 38 - 126 U/L   Total Bilirubin 0.4 0.3 - 1.2 mg/dL   GFR, Estimated >60 >60 mL/min    Comment: (NOTE) Calculated using the CKD-EPI Creatinine Equation (2021)    Anion gap 6 5 - 15    Comment: Performed at [facility] at [facility], [address] [place], [place] [address]  Lactate dehydrogenase (LDH)     Status: None   Collection Time: 09/24/22  8:51 AM  Result Value Ref Range   LDH 148 98 - 192 U/L    Comment: Performed at [facility] at [facility], [address] [place], [place] [address]  Save Smear for Provider Slide Review     Status: None   Collection Time: 09/24/22  8:51 AM  Result Value Ref Range   Smear Review SMEAR STAINED AND AVAILABLE FOR REVIEW     Comment: Performed at [facility]  at [facility], [address] [place], [place] [address]      RADIOGRAPHY: No results found.     PATHOLOGY: None  ASSESSMENT/PLAN: Ms. Estorga is a very pleasant 56 yo caucasian female with recent mild eosinophilia on routine lab work with PCP as well as past history of iron deficiency.  Iron studies are pending.  Eosinophils have returned to normal limits.  Follow-up pending iron and smear results.   All questions were answered. The patient knows to call the clinic with any problems, questions or concerns. We can certainly see the patient much sooner if necessary.  The patient was discussed with Dr. Marin Olp and he is in agreement with the aforementioned.   Lottie Dawson, NP

## 2022-09-27 ENCOUNTER — Other Ambulatory Visit: Payer: Self-pay | Admitting: Rheumatology

## 2022-09-27 DIAGNOSIS — Z79899 Other long term (current) drug therapy: Secondary | ICD-10-CM | POA: Diagnosis not present

## 2022-09-27 DIAGNOSIS — M354 Diffuse (eosinophilic) fasciitis: Secondary | ICD-10-CM | POA: Diagnosis not present

## 2022-09-27 DIAGNOSIS — K760 Fatty (change of) liver, not elsewhere classified: Secondary | ICD-10-CM | POA: Diagnosis not present

## 2022-09-27 DIAGNOSIS — M1711 Unilateral primary osteoarthritis, right knee: Secondary | ICD-10-CM | POA: Diagnosis not present

## 2022-09-27 DIAGNOSIS — M25561 Pain in right knee: Secondary | ICD-10-CM | POA: Diagnosis not present

## 2022-09-27 DIAGNOSIS — R768 Other specified abnormal immunological findings in serum: Secondary | ICD-10-CM | POA: Diagnosis not present

## 2022-09-27 DIAGNOSIS — E669 Obesity, unspecified: Secondary | ICD-10-CM | POA: Diagnosis not present

## 2022-09-27 DIAGNOSIS — M79643 Pain in unspecified hand: Secondary | ICD-10-CM | POA: Diagnosis not present

## 2022-09-27 DIAGNOSIS — M1990 Unspecified osteoarthritis, unspecified site: Secondary | ICD-10-CM | POA: Diagnosis not present

## 2022-09-27 DIAGNOSIS — M79631 Pain in right forearm: Secondary | ICD-10-CM | POA: Diagnosis not present

## 2022-09-27 DIAGNOSIS — K5190 Ulcerative colitis, unspecified, without complications: Secondary | ICD-10-CM | POA: Diagnosis not present

## 2022-09-27 LAB — IRON AND IRON BINDING CAPACITY (CC-WL,HP ONLY)
Iron: 94 ug/dL (ref 28–170)
Saturation Ratios: 27 % (ref 10.4–31.8)
TIBC: 350 ug/dL (ref 250–450)
UIBC: 256 ug/dL (ref 148–442)

## 2022-09-27 LAB — FERRITIN: Ferritin: 28 ng/mL (ref 11–307)

## 2022-09-28 ENCOUNTER — Telehealth: Payer: Self-pay

## 2022-09-28 ENCOUNTER — Other Ambulatory Visit: Payer: Self-pay | Admitting: Rheumatology

## 2022-09-28 DIAGNOSIS — M7989 Other specified soft tissue disorders: Secondary | ICD-10-CM

## 2022-09-28 NOTE — Telephone Encounter (Signed)
Pt called inquiring about her iron results, called pt back and informed her that her iron studies were reviewed by Judson Roch and they looked good, she recommended continuing follow up with RA. Pt informed and declined any other questions or concerns at this time.

## 2022-09-29 ENCOUNTER — Telehealth: Payer: Self-pay | Admitting: *Deleted

## 2022-09-29 NOTE — Telephone Encounter (Signed)
Per 09/24/22 los -   Pending lab results.

## 2022-10-06 ENCOUNTER — Ambulatory Visit: Payer: 59 | Admitting: Gastroenterology

## 2022-10-11 DIAGNOSIS — M79641 Pain in right hand: Secondary | ICD-10-CM | POA: Diagnosis not present

## 2022-10-11 DIAGNOSIS — S56901A Unspecified injury of unspecified muscles, fascia and tendons at forearm level, right arm, initial encounter: Secondary | ICD-10-CM | POA: Diagnosis not present

## 2022-10-11 DIAGNOSIS — M79642 Pain in left hand: Secondary | ICD-10-CM | POA: Diagnosis not present

## 2022-10-12 DIAGNOSIS — S56901A Unspecified injury of unspecified muscles, fascia and tendons at forearm level, right arm, initial encounter: Secondary | ICD-10-CM | POA: Diagnosis not present

## 2022-10-12 DIAGNOSIS — K760 Fatty (change of) liver, not elsewhere classified: Secondary | ICD-10-CM | POA: Diagnosis not present

## 2022-10-12 DIAGNOSIS — K51818 Other ulcerative colitis with other complication: Secondary | ICD-10-CM | POA: Diagnosis not present

## 2022-10-12 DIAGNOSIS — M1380 Other specified arthritis, unspecified site: Secondary | ICD-10-CM | POA: Diagnosis not present

## 2022-10-12 DIAGNOSIS — Z9884 Bariatric surgery status: Secondary | ICD-10-CM | POA: Diagnosis not present

## 2022-10-12 DIAGNOSIS — Z79899 Other long term (current) drug therapy: Secondary | ICD-10-CM | POA: Diagnosis not present

## 2022-10-26 DIAGNOSIS — Z79631 Long term (current) use of antimetabolite agent: Secondary | ICD-10-CM | POA: Diagnosis not present

## 2022-10-26 DIAGNOSIS — K5190 Ulcerative colitis, unspecified, without complications: Secondary | ICD-10-CM | POA: Diagnosis not present

## 2022-10-26 DIAGNOSIS — K219 Gastro-esophageal reflux disease without esophagitis: Secondary | ICD-10-CM | POA: Diagnosis not present

## 2022-10-26 DIAGNOSIS — Z833 Family history of diabetes mellitus: Secondary | ICD-10-CM | POA: Diagnosis not present

## 2022-10-26 DIAGNOSIS — M1990 Unspecified osteoarthritis, unspecified site: Secondary | ICD-10-CM | POA: Diagnosis not present

## 2022-10-26 DIAGNOSIS — Z7952 Long term (current) use of systemic steroids: Secondary | ICD-10-CM | POA: Diagnosis not present

## 2022-10-26 DIAGNOSIS — E1140 Type 2 diabetes mellitus with diabetic neuropathy, unspecified: Secondary | ICD-10-CM | POA: Diagnosis not present

## 2022-10-26 DIAGNOSIS — F3341 Major depressive disorder, recurrent, in partial remission: Secondary | ICD-10-CM | POA: Diagnosis not present

## 2022-10-26 DIAGNOSIS — M069 Rheumatoid arthritis, unspecified: Secondary | ICD-10-CM | POA: Diagnosis not present

## 2022-10-26 DIAGNOSIS — Z7985 Long-term (current) use of injectable non-insulin antidiabetic drugs: Secondary | ICD-10-CM | POA: Diagnosis not present

## 2022-10-26 DIAGNOSIS — Z791 Long term (current) use of non-steroidal anti-inflammatories (NSAID): Secondary | ICD-10-CM | POA: Diagnosis not present

## 2022-10-26 DIAGNOSIS — Z8249 Family history of ischemic heart disease and other diseases of the circulatory system: Secondary | ICD-10-CM | POA: Diagnosis not present

## 2022-10-29 ENCOUNTER — Other Ambulatory Visit (HOSPITAL_BASED_OUTPATIENT_CLINIC_OR_DEPARTMENT_OTHER): Payer: 59

## 2022-10-29 ENCOUNTER — Ambulatory Visit (HOSPITAL_BASED_OUTPATIENT_CLINIC_OR_DEPARTMENT_OTHER): Payer: 59

## 2022-11-01 ENCOUNTER — Encounter: Payer: Self-pay | Admitting: Gastroenterology

## 2022-11-01 ENCOUNTER — Ambulatory Visit (INDEPENDENT_AMBULATORY_CARE_PROVIDER_SITE_OTHER): Payer: 59 | Admitting: Gastroenterology

## 2022-11-01 VITALS — BP 110/78 | HR 57 | Ht 62.0 in | Wt 163.0 lb

## 2022-11-01 DIAGNOSIS — K51019 Ulcerative (chronic) pancolitis with unspecified complications: Secondary | ICD-10-CM

## 2022-11-01 DIAGNOSIS — K760 Fatty (change of) liver, not elsewhere classified: Secondary | ICD-10-CM

## 2022-11-01 DIAGNOSIS — K219 Gastro-esophageal reflux disease without esophagitis: Secondary | ICD-10-CM

## 2022-11-01 NOTE — Patient Instructions (Signed)
Kaira (Dr. Lynne Leader nurse) will contact you once we have figured out which biologic is covered by your insurance. If you have not heard from [place] in a week then contact our office.   The [facility] providers would like to encourage you to use [facility] to communicate with providers for non-urgent requests or questions.  Due to long hold times on the telephone, sending your provider a message by [facility] may be a faster and more efficient way to get a response.  Please allow 48 business hours for a response.  Please remember that this is for non-urgent requests.   Thank you for choosing me and [facility].  Pricilla Riffle. Dagoberto Ligas., MD., Marval Regal

## 2022-11-01 NOTE — Progress Notes (Signed)
    Assessment     Ulcerative pancolitis maintained on Humira GERD Hepatic steatosis   Recommendations    Consider Entyvio, Zeposia or Rinvoq. Review insurance coverage Colonoscopy in June 2024 Continue Nexium 40 mg every morning and famotidine 40 mg nightly.  If her symptoms do not improve off prednisone consider changing Nexium to 40 mg twice daily Long-term carb modified, fat modified, weight loss diet supervised by Dr. Birdie Riddle and Dr. Leafy Ro REV in 6 months   HPI    This is a 56 year old female with pan-ulcerative colitis who was maintained on Humira and is returning for follow-up.  Her ulcerative colitis remains under very good control.  She was evaluated by Dr. Lahoma Rocker on September 27, 2022 who recommended stopping Humira as anti-TNF medications due to her positive ANA with low complement levels associated with new hand / forearm pain and swelling and stiffness.  Dr. Kathlene November also recommended starting a 1 month course of prednisone and starting methotrexate.  Patient states her hand and forearm symptoms did not improve at all on prednisone and methotrexate so she has discontinued them.  She discontinued Humira about 4 to 6 weeks ago after her visit with Dr. Kathlene November.  Her reflux symptoms have been more active for the past few weeks corresponding with her time on prednisone.   Labs / Imaging       Latest Ref Rng & Units 09/24/2022    8:51 AM 09/07/2022   10:33 AM 04/05/2022    9:41 AM  Hepatic Function  Total Protein 6.5 - 8.1 g/dL 6.7  7.4  6.1   Albumin 3.5 - 5.0 g/dL 3.8  3.6  3.8   AST 15 - 41 U/L 13  39  23   ALT 0 - 44 U/L _0 Alk Phosphatase 38 - 126 U/L 50  60  61   Total Bilirubin 0.3 - 1.2 mg/dL 0.4  0.4  0.2   Bilirubin, Direct 0.0 - 0.3 mg/dL  0.1         Latest Ref Rng & Units 09/24/2022    8:51 AM 09/07/2022   10:33 AM 04/05/2022    9:41 AM  CBC  WBC 4.0 - 10.5 K/uL 10.8  5.3  5.6   Hemoglobin 12.0 - 15.0 g/dL 14.6  14.3  14.1   Hematocrit 36.0 -  46.0 % 44.4  42.7  42.4   Platelets 150 - 400 K/uL 255  244.0  258     Current Medications, Allergies, Past Medical History, Past Surgical History, Family History and Social History were reviewed in [employer] record.   Physical Exam: General: Well developed, well nourished, no acute distress Head: Normocephalic and atraumatic Eyes: Sclerae anicteric, EOMI Ears: Normal auditory acuity Mouth: No deformities or lesions noted Lungs: Clear throughout to auscultation Heart: Regular rate and rhythm; No murmurs, rubs or bruits Abdomen: Soft, non tender and non distended. No masses, hepatosplenomegaly or hernias noted. Normal Bowel sounds Rectal: Not done Musculoskeletal: Symmetrical with no gross deformities  Pulses:  Normal pulses noted Extremities: No edema or deformities noted Neurological: Alert oriented x 4, grossly nonfocal Psychological:  Alert and cooperative. Normal mood and affect   Melissa Cohen T. Fuller Plan, MD 11/01/2022, 3:15 PM

## 2022-11-02 ENCOUNTER — Telehealth: Payer: Self-pay

## 2022-11-02 DIAGNOSIS — K760 Fatty (change of) liver, not elsewhere classified: Secondary | ICD-10-CM | POA: Diagnosis not present

## 2022-11-02 DIAGNOSIS — M79631 Pain in right forearm: Secondary | ICD-10-CM | POA: Diagnosis not present

## 2022-11-02 DIAGNOSIS — M1711 Unilateral primary osteoarthritis, right knee: Secondary | ICD-10-CM | POA: Diagnosis not present

## 2022-11-02 DIAGNOSIS — K5190 Ulcerative colitis, unspecified, without complications: Secondary | ICD-10-CM | POA: Diagnosis not present

## 2022-11-02 DIAGNOSIS — R768 Other specified abnormal immunological findings in serum: Secondary | ICD-10-CM | POA: Diagnosis not present

## 2022-11-02 DIAGNOSIS — Z79899 Other long term (current) drug therapy: Secondary | ICD-10-CM | POA: Diagnosis not present

## 2022-11-02 DIAGNOSIS — E669 Obesity, unspecified: Secondary | ICD-10-CM | POA: Diagnosis not present

## 2022-11-02 DIAGNOSIS — M79643 Pain in unspecified hand: Secondary | ICD-10-CM | POA: Diagnosis not present

## 2022-11-02 DIAGNOSIS — M354 Diffuse (eosinophilic) fasciitis: Secondary | ICD-10-CM | POA: Diagnosis not present

## 2022-11-02 DIAGNOSIS — M25561 Pain in right knee: Secondary | ICD-10-CM | POA: Diagnosis not present

## 2022-11-02 DIAGNOSIS — M1990 Unspecified osteoarthritis, unspecified site: Secondary | ICD-10-CM | POA: Diagnosis not present

## 2022-11-03 ENCOUNTER — Telehealth: Payer: Self-pay

## 2022-11-03 NOTE — Telephone Encounter (Signed)
-----   Message from Wynetta Fines, CPhT sent at 11/03/2022  8:20 AM EST ----- Regarding: RE: Sampson Si, I will begin the benefits investigation and  submit the auth and will f/u with you once I have a response. (Normal response time from insurance for an Josem Kaufmann is about 10-14 days- fyi) ----- Message ----- From: Carl Best, RN Sent: 11/02/2022   4:08 PM EST To: Wynetta Fines, CPhT Subject: Foye Clock, this is a patient of Dr. Lynne Leader who recently came off Humira about 4-6 weeks ago. He is recommending that she start on Entyivio if her insurance will cover. Are you able to check to see if insurance will cover this medication at your center?  Thank you!

## 2022-11-03 NOTE — Telephone Encounter (Signed)
Error

## 2022-11-05 NOTE — Telephone Encounter (Signed)
Wynetta Fines, CPhT  , Salvadore Dom, RN [place], Unfortunately, [facility] is out of network with patients insurance plan and she does not have out of network benefits.  Patient will need to be referred to another site ([facility])

## 2022-11-05 NOTE — Telephone Encounter (Signed)
Secure staff message sent to [facility] with prior auth team on 11/04/22 to see if Zenposia or Rinvoq is an option for patient depending on coverage.

## 2022-11-06 ENCOUNTER — Other Ambulatory Visit: Payer: 59

## 2022-11-06 ENCOUNTER — Ambulatory Visit
Admission: RE | Admit: 2022-11-06 | Discharge: 2022-11-06 | Disposition: A | Discharge status: Home / Self Care | Payer: 59 | Source: Ambulatory Visit | Attending: Rheumatology | Admitting: Rheumatology

## 2022-11-06 DIAGNOSIS — M7989 Other specified soft tissue disorders: Secondary | ICD-10-CM | POA: Diagnosis not present

## 2022-11-06 DIAGNOSIS — R600 Localized edema: Secondary | ICD-10-CM | POA: Diagnosis not present

## 2022-11-06 DIAGNOSIS — M729 Fibroblastic disorder, unspecified: Secondary | ICD-10-CM | POA: Diagnosis not present

## 2022-11-08 DIAGNOSIS — S56901A Unspecified injury of unspecified muscles, fascia and tendons at forearm level, right arm, initial encounter: Secondary | ICD-10-CM | POA: Diagnosis not present

## 2022-11-08 DIAGNOSIS — M79642 Pain in left hand: Secondary | ICD-10-CM | POA: Diagnosis not present

## 2022-11-08 DIAGNOSIS — M79641 Pain in right hand: Secondary | ICD-10-CM | POA: Diagnosis not present

## 2022-11-08 NOTE — Telephone Encounter (Signed)
, Salvadore Dom, RN  Baker, Melissa Cohen, CPhT  Hi Melissa, this is a patient of Dr. Fuller Plan. She was recently seen for an OV & he discussed setting her up with Shelbie Ammons or Rinvoq depending on insurance coverage. Are you able to see if her insurance would even cover the Rinvoq or Venposa prior to ordering a prescription?  Thanks!

## 2022-11-09 DIAGNOSIS — K5190 Ulcerative colitis, unspecified, without complications: Secondary | ICD-10-CM | POA: Diagnosis not present

## 2022-11-09 DIAGNOSIS — Z6828 Body mass index (BMI) 28.0-28.9, adult: Secondary | ICD-10-CM | POA: Diagnosis not present

## 2022-11-09 DIAGNOSIS — E8881 Metabolic syndrome: Secondary | ICD-10-CM | POA: Diagnosis not present

## 2022-11-09 DIAGNOSIS — E663 Overweight: Secondary | ICD-10-CM | POA: Diagnosis not present

## 2022-11-09 DIAGNOSIS — Z8639 Personal history of other endocrine, nutritional and metabolic disease: Secondary | ICD-10-CM | POA: Diagnosis not present

## 2022-11-09 DIAGNOSIS — Z79899 Other long term (current) drug therapy: Secondary | ICD-10-CM | POA: Diagnosis not present

## 2022-11-09 DIAGNOSIS — M25531 Pain in right wrist: Secondary | ICD-10-CM | POA: Diagnosis not present

## 2022-11-09 DIAGNOSIS — K760 Fatty (change of) liver, not elsewhere classified: Secondary | ICD-10-CM | POA: Diagnosis not present

## 2022-11-10 ENCOUNTER — Other Ambulatory Visit (HOSPITAL_COMMUNITY): Payer: Self-pay

## 2022-11-10 NOTE — Telephone Encounter (Signed)
Will do what would the regimen be Rinvoq? 26m for 8 weeks then 369mor 1533m

## 2022-11-10 NOTE — Telephone Encounter (Signed)
As she in the maintenance phase please start with and continue with Rinvoq 15 mg po qd.  We recommend she reviews this medication choice with Dr. Kathlene November to starting.

## 2022-11-10 NOTE — Telephone Encounter (Signed)
Prior authorization is required for both Rinvoq and Zeposia. Please advise.

## 2022-11-15 ENCOUNTER — Encounter: Payer: Self-pay | Admitting: Gastroenterology

## 2022-11-15 DIAGNOSIS — M25841 Other specified joint disorders, right hand: Secondary | ICD-10-CM | POA: Diagnosis not present

## 2022-11-15 DIAGNOSIS — M24541 Contracture, right hand: Secondary | ICD-10-CM | POA: Diagnosis not present

## 2022-11-15 DIAGNOSIS — M24542 Contracture, left hand: Secondary | ICD-10-CM | POA: Diagnosis not present

## 2022-11-15 DIAGNOSIS — M25842 Other specified joint disorders, left hand: Secondary | ICD-10-CM | POA: Diagnosis not present

## 2022-11-16 ENCOUNTER — Encounter: Payer: 59 | Admitting: Family Medicine

## 2022-11-18 ENCOUNTER — Encounter: Payer: 59 | Admitting: Family Medicine

## 2022-11-23 ENCOUNTER — Other Ambulatory Visit (HOSPITAL_COMMUNITY): Payer: Self-pay

## 2022-11-23 ENCOUNTER — Telehealth: Payer: Self-pay | Admitting: Pharmacy Technician

## 2022-11-23 NOTE — Telephone Encounter (Signed)
Patient Advocate Encounter  Received notification from [facility] that prior authorization for RINVOQ 15MG is required.   PA submitted on 12.26.23 Key [id number]  Status is pending

## 2022-11-24 ENCOUNTER — Other Ambulatory Visit (HOSPITAL_COMMUNITY): Payer: Self-pay

## 2022-11-24 NOTE — Telephone Encounter (Signed)
PA has been submitted for 47m tablet, and telephone encounter has been created.

## 2022-11-24 NOTE — Telephone Encounter (Signed)
Patient Advocate Encounter  Prior Authorization for RINVOQ 15MG has been approved.    PA# [id number] Effective dates: 12.27.23 through 12.26.24

## 2022-11-25 ENCOUNTER — Other Ambulatory Visit: Payer: Self-pay

## 2022-11-25 MED ORDER — RINVOQ 15 MG PO TB24: 15.0000 mg | ORAL_TABLET | Freq: Every day | ORAL | 2 refills | Status: DC

## 2022-11-25 NOTE — Telephone Encounter (Signed)
Prescription has been sent to [facility] special[facility] pharmacy.

## 2022-12-03 ENCOUNTER — Encounter: Payer: Self-pay | Admitting: Family Medicine

## 2022-12-03 ENCOUNTER — Other Ambulatory Visit: Payer: Self-pay

## 2022-12-03 ENCOUNTER — Telehealth: Payer: 59

## 2022-12-03 DIAGNOSIS — F39 Unspecified mood [affective] disorder: Secondary | ICD-10-CM

## 2022-12-03 MED ORDER — ESOMEPRAZOLE MAGNESIUM 40 MG PO CPDR: 40.0000 mg | DELAYED_RELEASE_CAPSULE | Freq: Every day | ORAL | 3 refills | Status: DC

## 2022-12-03 MED ORDER — BUPROPION HCL ER (XL) 300 MG PO TB24: 300.0000 mg | ORAL_TABLET | Freq: Every day | ORAL | 1 refills | Status: DC

## 2022-12-06 ENCOUNTER — Other Ambulatory Visit: Payer: Self-pay

## 2022-12-06 DIAGNOSIS — M79642 Pain in left hand: Secondary | ICD-10-CM

## 2022-12-06 DIAGNOSIS — F39 Unspecified mood [affective] disorder: Secondary | ICD-10-CM

## 2022-12-06 MED ORDER — CELECOXIB 200 MG PO CAPS: 200.0000 mg | ORAL_CAPSULE | Freq: Two times a day (BID) | ORAL | 0 refills | Status: DC

## 2022-12-06 MED ORDER — BUPROPION HCL ER (XL) 300 MG PO TB24: 300.0000 mg | ORAL_TABLET | Freq: Every day | ORAL | 1 refills | Status: DC

## 2022-12-06 MED ORDER — GABAPENTIN 300 MG PO CAPS: 300.0000 mg | ORAL_CAPSULE | Freq: Three times a day (TID) | ORAL | 3 refills | Status: DC

## 2022-12-06 NOTE — Telephone Encounter (Signed)
Gabapentin 300 mg LOV: 09/07/22 Last Refill:09/13/22 Upcoming appt: no apt

## 2022-12-08 ENCOUNTER — Other Ambulatory Visit: Payer: Self-pay

## 2022-12-08 MED ORDER — ESOMEPRAZOLE MAGNESIUM 40 MG PO CPDR: 40.0000 mg | DELAYED_RELEASE_CAPSULE | Freq: Every day | ORAL | 0 refills | Status: DC

## 2022-12-14 DIAGNOSIS — Z6829 Body mass index (BMI) 29.0-29.9, adult: Secondary | ICD-10-CM | POA: Diagnosis not present

## 2022-12-14 DIAGNOSIS — J00 Acute nasopharyngitis [common cold]: Secondary | ICD-10-CM | POA: Diagnosis not present

## 2022-12-14 DIAGNOSIS — R5383 Other fatigue: Secondary | ICD-10-CM | POA: Diagnosis not present

## 2022-12-17 ENCOUNTER — Other Ambulatory Visit (HOSPITAL_COMMUNITY): Payer: Self-pay

## 2022-12-20 DIAGNOSIS — Z79899 Other long term (current) drug therapy: Secondary | ICD-10-CM | POA: Diagnosis not present

## 2022-12-20 DIAGNOSIS — R632 Polyphagia: Secondary | ICD-10-CM | POA: Diagnosis not present

## 2022-12-20 DIAGNOSIS — K5190 Ulcerative colitis, unspecified, without complications: Secondary | ICD-10-CM | POA: Diagnosis not present

## 2022-12-20 DIAGNOSIS — Z6827 Body mass index (BMI) 27.0-27.9, adult: Secondary | ICD-10-CM | POA: Diagnosis not present

## 2022-12-20 DIAGNOSIS — E663 Overweight: Secondary | ICD-10-CM | POA: Diagnosis not present

## 2022-12-20 DIAGNOSIS — R058 Other specified cough: Secondary | ICD-10-CM | POA: Diagnosis not present

## 2022-12-20 DIAGNOSIS — E8881 Metabolic syndrome: Secondary | ICD-10-CM | POA: Diagnosis not present

## 2022-12-20 DIAGNOSIS — M1990 Unspecified osteoarthritis, unspecified site: Secondary | ICD-10-CM | POA: Diagnosis not present

## 2022-12-20 DIAGNOSIS — Z8639 Personal history of other endocrine, nutritional and metabolic disease: Secondary | ICD-10-CM | POA: Diagnosis not present

## 2023-01-03 ENCOUNTER — Encounter: Payer: Self-pay | Admitting: Family Medicine

## 2023-01-03 ENCOUNTER — Telehealth (INDEPENDENT_AMBULATORY_CARE_PROVIDER_SITE_OTHER): Payer: 59 | Admitting: Family Medicine

## 2023-01-03 VITALS — Temp 97.5°F | Wt 160.0 lb

## 2023-01-03 DIAGNOSIS — R052 Subacute cough: Secondary | ICD-10-CM

## 2023-01-03 MED ORDER — ALBUTEROL SULFATE HFA 108 (90 BASE) MCG/ACT IN AERS: 2.0000 | INHALATION_SPRAY | Freq: Four times a day (QID) | RESPIRATORY_TRACT | 0 refills | Status: AC | PRN

## 2023-01-03 NOTE — Progress Notes (Signed)
Virtual Visit via Video   I connected with patient on 01/03/23 at  2:20 PM EST by a video enabled telemedicine application and verified that I am speaking with the correct person using two identifiers.  Location patient: Home Location provider: [facility], Office Persons participating in the virtual visit: Patient, Provider, Platter Marcille Blanco C)  I discussed the limitations of evaluation and management by telemedicine and the availability of in person appointments. The patient expressed understanding and agreed to proceed.  Subjective:   HPI:   Cough- pt had COVID on 1/3 and was tx'd w/ Molnupiravir by Rheum.  Was testing negative on 1/8.  Developed cough afterwards.  Has been taking OTC cough meds w/o relief.  Saw Dr Juleen China on 1/24 and was given Zpack and Tessalon.  Continues to have a dry cough- worse w/ talking and at night.  No SOB.  ROS:   See pertinent positives and negatives per HPI.  Patient Active Problem List   Diagnosis Date Noted   Low back pain 05/13/2021   Adhesive capsulitis of left shoulder 96/78/9381   Metabolic syndrome 01/75/1025   At risk for impaired metabolic function 85/27/7824   Polyphagia 11/04/2020   Mood disorder, with emotional eating 11/04/2020   At risk for activity intolerance 11/04/2020   Vasovagal near syncope 03/26/2020   Frequent falls 02/13/2020   HTN (hypertension) 23/53/6144   Systolic murmur 31/54/0086   Chronic rhinitis 03/17/2016   Menopausal syndrome (hot flushes) 08/23/2014   Screening for malignant neoplasm of the cervix 02/25/2014   General medical examination 12/07/2011   Anxiety and depression 10/08/2011   SACROILIAC JOINT DYSFUNCTION 04/12/2011   TROCHANTERIC BURSITIS, BILATERAL 04/12/2011   NEVUS, ATYPICAL 07/14/2010   Cervical radiculopathy 07/14/2010   GERD 12/31/2009   CARCINOMA, BASAL CELL 03/14/2008   Vitamin D deficiency 03/06/2008   HYPERTRIGLYCERIDEMIA 03/06/2008   Ulcerative colitis ([place]) 02/09/2007    INSOMNIA 02/09/2007    Social History   Tobacco Use   Smoking status: Former    Packs/day: 0.50    Years: 25.00    Total pack years: 12.50    Types: Cigarettes    Quit date: 02/27/2006    Years since quitting: 16.8   Smokeless tobacco: Never  Substance Use Topics   Alcohol use: Yes    Alcohol/week: 3.0 standard drinks of alcohol    Types: 3 Glasses of wine per week    Comment: every other weekend socially    Current Outpatient Medications:    B Complex-C (SUPER B COMPLEX PO), Take 1 tablet by mouth every other day., Disp: , Rfl:    Biotin 10000 MCG TABS, Take by mouth., Disp: , Rfl:    buPROPion (WELLBUTRIN XL) 300 MG 24 hr tablet, Take 1 tablet (300 mg total) by mouth daily., Disp: 90 tablet, Rfl: 1   celecoxib (CELEBREX) 200 MG capsule, Take 1 capsule (200 mg total) by mouth 2 (two) times daily., Disp: 180 capsule, Rfl: 0   Cholecalciferol (VITAMIN D3) 125 MCG (5000 UT) CAPS, Take 1 capsule by mouth daily., Disp: , Rfl:    esomeprazole (NEXIUM) 40 MG capsule, Take 1 capsule (40 mg total) by mouth daily., Disp: 90 capsule, Rfl: 0   famotidine (PEPCID) 40 MG tablet, TAKE ONE TABLET BY MOUTH ONE TIME DAILY, Disp: 90 tablet, Rfl: 3   gabapentin (NEURONTIN) 300 MG capsule, Take 1 capsule (300 mg total) by mouth 3 (three) times daily., Disp: 90 capsule, Rfl: 3   Insulin Pen Needle 32G X 4 MM MISC,  1 each by Does not apply route daily., Disp: 100 each, Rfl: 0   ondansetron (ZOFRAN-ODT) 4 MG disintegrating tablet, PLACE ONE TABLET ON THE TONGUE EVERY 6 HOURS AS NEEDED FOR NAUSEA AND VOMITING, Disp: 20 tablet, Rfl: 0   Probiotic Product (PROBIOTIC DAILY PO), Take 1 capsule by mouth daily., Disp: , Rfl:    simethicone (MYLICON) 458 MG chewable tablet, Chew 125 mg by mouth every 6 (six) hours as needed for flatulence., Disp: , Rfl:    Upadacitinib ER (RINVOQ) 15 MG TB24, Take 15 mg by mouth daily., Disp: 30 tablet, Rfl: 2  Allergies  Allergen Reactions   Tagamet [Cimetidine] Anaphylaxis     REACTION: anaphalactic shock   Effexor [Venlafaxine]     Swelling    Mobic [Meloxicam] Swelling   Ultram [Tramadol] Swelling    Objective:   Temp (!) 97.5 F (36.4 C) (Oral)   Wt 160 lb (72.6 kg)   LMP 09/02/2013   BMI 29.26 kg/m  AAOx3, NAD NCAT, EOMI No obvious CN deficits Coloring WNL Pt is able to speak clearly, coherently without shortness of breath or increased work of breathing.  + dry, hacking cough Thought process is linear.  Mood is appropriate.   Assessment and Plan:   Subacute cough- new.  Pt had COVID 1 month ago and was subsequently tx'd w/ Zpack and cough meds for ongoing cough.  At this time, pt reports feeling well w/ exception of coughing w/ prolonged talking or at night.  Suspect airway inflammation.  Start Prednisone taper- '30mg'$  x3 days, '20mg'$  x3 days, and '10mg'$  x3 days (pt has prednisone available at home) and add albuterol inhaler.  Reviewed supportive care and red flags that should prompt return.  Pt expressed understanding and is in agreement w/ plan.    Annye Asa, MD 01/03/2023

## 2023-01-04 ENCOUNTER — Telehealth: Payer: Self-pay | Admitting: Pharmacy Technician

## 2023-01-04 DIAGNOSIS — E8881 Metabolic syndrome: Secondary | ICD-10-CM | POA: Diagnosis not present

## 2023-01-04 DIAGNOSIS — Z8639 Personal history of other endocrine, nutritional and metabolic disease: Secondary | ICD-10-CM | POA: Diagnosis not present

## 2023-01-04 DIAGNOSIS — E663 Overweight: Secondary | ICD-10-CM | POA: Diagnosis not present

## 2023-01-04 DIAGNOSIS — R051 Acute cough: Secondary | ICD-10-CM | POA: Diagnosis not present

## 2023-01-04 DIAGNOSIS — Z6828 Body mass index (BMI) 28.0-28.9, adult: Secondary | ICD-10-CM | POA: Diagnosis not present

## 2023-01-04 NOTE — Telephone Encounter (Signed)
Patient Advocate Encounter  Received notification from [facility] that prior authorization for ESOMEPRAZOLE '40MG'$  is required.   PA submitted on 2.6.24 Key [id number] Status is pending

## 2023-01-05 ENCOUNTER — Other Ambulatory Visit (HOSPITAL_COMMUNITY): Payer: Self-pay

## 2023-01-12 NOTE — Telephone Encounter (Signed)
Patient Advocate Encounter  Prior Authorization for ESOMEPRAZOLE 40MG has been approved.    PA# [id number] Effective dates: 2.6.24 through 2.6.25

## 2023-01-16 DIAGNOSIS — J209 Acute bronchitis, unspecified: Secondary | ICD-10-CM | POA: Diagnosis not present

## 2023-01-16 DIAGNOSIS — Z6828 Body mass index (BMI) 28.0-28.9, adult: Secondary | ICD-10-CM | POA: Diagnosis not present

## 2023-01-17 ENCOUNTER — Other Ambulatory Visit: Payer: Self-pay | Admitting: Family

## 2023-01-17 DIAGNOSIS — D509 Iron deficiency anemia, unspecified: Secondary | ICD-10-CM

## 2023-01-17 DIAGNOSIS — D721 Eosinophilia, unspecified: Secondary | ICD-10-CM

## 2023-01-17 DIAGNOSIS — R29898 Other symptoms and signs involving the musculoskeletal system: Secondary | ICD-10-CM | POA: Diagnosis not present

## 2023-01-17 DIAGNOSIS — R768 Other specified abnormal immunological findings in serum: Secondary | ICD-10-CM | POA: Diagnosis not present

## 2023-01-17 DIAGNOSIS — R809 Proteinuria, unspecified: Secondary | ICD-10-CM | POA: Diagnosis not present

## 2023-01-18 ENCOUNTER — Inpatient Hospital Stay: Payer: 59

## 2023-01-18 ENCOUNTER — Telehealth: Payer: Self-pay | Admitting: *Deleted

## 2023-01-18 ENCOUNTER — Inpatient Hospital Stay: Payer: 59 | Admitting: Family

## 2023-01-18 NOTE — Telephone Encounter (Signed)
Patient lvm after hours to cancel appointment with Judson Roch due to illness - will call back to reschedule

## 2023-01-21 DIAGNOSIS — Z86018 Personal history of other benign neoplasm: Secondary | ICD-10-CM | POA: Diagnosis not present

## 2023-01-21 DIAGNOSIS — Z1283 Encounter for screening for malignant neoplasm of skin: Secondary | ICD-10-CM | POA: Diagnosis not present

## 2023-01-21 DIAGNOSIS — L0889 Other specified local infections of the skin and subcutaneous tissue: Secondary | ICD-10-CM | POA: Diagnosis not present

## 2023-01-21 DIAGNOSIS — D2260 Melanocytic nevi of unspecified upper limb, including shoulder: Secondary | ICD-10-CM | POA: Diagnosis not present

## 2023-01-21 DIAGNOSIS — D2270 Melanocytic nevi of unspecified lower limb, including hip: Secondary | ICD-10-CM | POA: Diagnosis not present

## 2023-01-21 DIAGNOSIS — R21 Rash and other nonspecific skin eruption: Secondary | ICD-10-CM | POA: Diagnosis not present

## 2023-01-21 DIAGNOSIS — Z85820 Personal history of malignant melanoma of skin: Secondary | ICD-10-CM | POA: Diagnosis not present

## 2023-01-21 DIAGNOSIS — L578 Other skin changes due to chronic exposure to nonionizing radiation: Secondary | ICD-10-CM | POA: Diagnosis not present

## 2023-01-21 DIAGNOSIS — D225 Melanocytic nevi of trunk: Secondary | ICD-10-CM | POA: Diagnosis not present

## 2023-01-21 DIAGNOSIS — L821 Other seborrheic keratosis: Secondary | ICD-10-CM | POA: Diagnosis not present

## 2023-01-21 DIAGNOSIS — L814 Other melanin hyperpigmentation: Secondary | ICD-10-CM | POA: Diagnosis not present

## 2023-01-31 ENCOUNTER — Other Ambulatory Visit: Payer: Self-pay | Admitting: Gastroenterology

## 2023-02-17 DIAGNOSIS — E8881 Metabolic syndrome: Secondary | ICD-10-CM | POA: Diagnosis not present

## 2023-02-17 DIAGNOSIS — E663 Overweight: Secondary | ICD-10-CM | POA: Diagnosis not present

## 2023-02-17 DIAGNOSIS — K760 Fatty (change of) liver, not elsewhere classified: Secondary | ICD-10-CM | POA: Diagnosis not present

## 2023-02-17 DIAGNOSIS — M1990 Unspecified osteoarthritis, unspecified site: Secondary | ICD-10-CM | POA: Diagnosis not present

## 2023-02-17 DIAGNOSIS — Z6827 Body mass index (BMI) 27.0-27.9, adult: Secondary | ICD-10-CM | POA: Diagnosis not present

## 2023-02-17 DIAGNOSIS — Z8639 Personal history of other endocrine, nutritional and metabolic disease: Secondary | ICD-10-CM | POA: Diagnosis not present

## 2023-02-18 ENCOUNTER — Encounter: Payer: Self-pay | Admitting: Gastroenterology

## 2023-02-18 ENCOUNTER — Other Ambulatory Visit: Payer: Self-pay

## 2023-02-18 DIAGNOSIS — K51019 Ulcerative (chronic) pancolitis with unspecified complications: Secondary | ICD-10-CM

## 2023-02-18 NOTE — Telephone Encounter (Signed)
supervising physician  Patient of Dr. Fuller Plan  Have reviewed his last note.  Plan:  - increase Rinvoq 30 mg p.o. once a day - Check CBC, CMP, CRP - Check stool studies for GI pathogens, calprotectin - She is due for colonoscopy.  Please set up colonoscopy with MiraLAX prep with Dr. Fuller Plan. - FU appointment with Dr. Fuller Plan APP.  If still with problems, may need short course of steroids.  RG

## 2023-02-18 NOTE — Telephone Encounter (Signed)
Fuller Plan pt. See mychart message and please advise. Dr. Fuller Plan will not be back until 02/28/23.

## 2023-02-18 NOTE — Telephone Encounter (Signed)
Please see note from pt and advise.  °

## 2023-02-22 ENCOUNTER — Other Ambulatory Visit: Payer: Self-pay

## 2023-02-22 ENCOUNTER — Telehealth: Payer: Self-pay | Admitting: Gastroenterology

## 2023-02-22 MED ORDER — RINVOQ 30 MG PO TB24: 30.0000 mg | ORAL_TABLET | Freq: Every day | ORAL | 3 refills | Status: DC

## 2023-02-22 NOTE — Telephone Encounter (Signed)
Inbound call from [facility] stated the Rinvoq 30 MG would need a prior authorization. Stated the fax was. [phone]

## 2023-02-23 NOTE — Telephone Encounter (Signed)
PA submitted, will update once we receive a response.

## 2023-02-23 NOTE — Telephone Encounter (Signed)
Initiated PA through [employer], awaiting clinical questions to generate- Key: [id number]

## 2023-02-24 DIAGNOSIS — R768 Other specified abnormal immunological findings in serum: Secondary | ICD-10-CM | POA: Diagnosis not present

## 2023-02-24 DIAGNOSIS — R29898 Other symptoms and signs involving the musculoskeletal system: Secondary | ICD-10-CM | POA: Diagnosis not present

## 2023-02-24 DIAGNOSIS — M79601 Pain in right arm: Secondary | ICD-10-CM | POA: Diagnosis not present

## 2023-02-24 NOTE — Telephone Encounter (Signed)
PA Approved through 02/22/24. Called Pharmacy, shipment has been setup for Monday 02/28/23

## 2023-03-07 ENCOUNTER — Other Ambulatory Visit: Payer: Self-pay

## 2023-03-07 MED ORDER — ESOMEPRAZOLE MAGNESIUM 40 MG PO CPDR: 40.0000 mg | DELAYED_RELEASE_CAPSULE | Freq: Every day | ORAL | 1 refills | Status: DC

## 2023-03-15 ENCOUNTER — Ambulatory Visit (INDEPENDENT_AMBULATORY_CARE_PROVIDER_SITE_OTHER): Payer: 59 | Admitting: Gastroenterology

## 2023-03-15 ENCOUNTER — Encounter: Payer: Self-pay | Admitting: Gastroenterology

## 2023-03-15 VITALS — BP 92/70 | HR 82 | Ht 62.0 in | Wt 163.4 lb

## 2023-03-15 DIAGNOSIS — K5100 Ulcerative (chronic) pancolitis without complications: Secondary | ICD-10-CM

## 2023-03-15 DIAGNOSIS — K219 Gastro-esophageal reflux disease without esophagitis: Secondary | ICD-10-CM

## 2023-03-15 MED ORDER — RINVOQ 30 MG PO TB24: 30.0000 mg | ORAL_TABLET | Freq: Every day | ORAL | 3 refills | Status: DC

## 2023-03-15 MED ORDER — ESOMEPRAZOLE MAGNESIUM 40 MG PO CPDR: 40.0000 mg | DELAYED_RELEASE_CAPSULE | Freq: Every day | ORAL | 3 refills | Status: DC

## 2023-03-15 NOTE — Patient Instructions (Signed)
We have sent the following medications to your pharmacy for you to pick up at your convenience: Rinvoq and Nexium.   It has been recommended to you by your physician that you have a(n) colonoscopy completed. Per your request, we did not schedule the procedure(s) today. Please contact our office at [phone] should you decide to have the procedure completed. You will be scheduled for a pre-visit and procedure at that time.  The [facility] providers would like to encourage you to use [facility] to communicate with providers for non-urgent requests or questions.  Due to long hold times on the telephone, sending your provider a message by [facility] may be a faster and more efficient way to get a response.  Please allow 48 business hours for a response.  Please remember that this is for non-urgent requests.   Due to recent changes in healthcare laws, you may see the results of your imaging and laboratory studies on MyChart before your provider has had a chance to review them.  We understand that in some cases there may be results that are confusing or concerning to you. Not all laboratory results come back in the same time frame and the provider may be waiting for multiple results in order to interpret others.  Please give [place] 48 hours in order for your provider to thoroughly review all the results before contacting the office for clarification of your results.   Thank you for choosing me and [facility].  Venita Lick. Pleas Koch., MD., Clementeen Graham

## 2023-03-15 NOTE — Progress Notes (Signed)
    Assessment     Pan ulcerative colitis, focal glandular atypia of the left colon GERD Hepatic steatosis   Recommendations    Continue Rinvoq 30 mg daily Schedule colonoscopy in July per patient request. SuTabs with Zofran 4 mg po prior to prep doses. The risks (including bleeding, perforation, infection, missed lesions, medication reactions and possible hospitalization or surgery if complications occur), benefits, and alternatives to colonoscopy with possible biopsy and possible polypectomy were discussed with the patient and they consent to proceed.   Continue Nexium 40 mg qam, famotidine 40 mg qd prn and follow antireflux measures   HPI    [name] with pan ulcerative colitis.  She was previously treated with Humira.  She was changed to Rinvoq 15 mg qd in February 2024 which was increased to 30 mg qd in late March 2024. She has done well. No diarrhea, rectal bleeding or abdominal pain.  Last colonoscopy performed in June 2022 showed 1 area with focal glandular atypia in the left colon otherwise biopsies were unremarkable.  Recent HBsAg and QuantiFERON gold were negative.   Labs / Imaging       Latest Ref Rng & Units 09/24/2022    8:51 AM 09/07/2022   10:33 AM 04/05/2022    9:41 AM  Hepatic Function  Total Protein 6.5 - 8.1 g/dL 6.7  7.4  6.1   Albumin 3.5 - 5.0 g/dL 3.8  3.6  3.8   AST 15 - 41 U/L 13  39  23   ALT 0 - 44 U/L Alk Phosphatase 38 - 126 U/L 50  60  61   Total Bilirubin 0.3 - 1.2 mg/dL 0.4  0.4  0.2   Bilirubin, Direct 0.0 - 0.3 mg/dL  0.1         Latest Ref Rng & Units 09/24/2022    8:51 AM 09/07/2022   10:33 AM 04/05/2022    9:41 AM  CBC  WBC 4.0 - 10.5 K/uL 10.8  5.3  5.6   Hemoglobin 12.0 - 15.0 g/dL 16.1  09.6  04.5   Hematocrit 36.0 - 46.0 % 44.4  42.7  42.4   Platelets 150 - 400 K/uL 255  244.0  258    Current Medications, Allergies, Past Medical History, Past Surgical History, Family History and Social History were  reviewed in [employer] record.   Physical Exam: General: Well developed, well nourished, no acute distress Head: Normocephalic and atraumatic Eyes: Sclerae anicteric, EOMI Ears: Normal auditory acuity Mouth: No deformities or lesions noted Lungs: Clear throughout to auscultation Heart: Regular rate and rhythm; No murmurs, rubs or bruits Abdomen: Soft, non tender and non distended. No masses, hepatosplenomegaly or hernias noted. Normal Bowel sounds Rectal: Deferred to colonoscopy  Musculoskeletal: Symmetrical with no gross deformities  Pulses:  Normal pulses noted Extremities: No edema or deformities noted Neurological: Alert oriented x 4, grossly nonfocal Psychological:  Alert and cooperative. Normal mood and affect   Melissa Townshend T. Russella Dar, MD 03/15/2023, 10:20 AM

## 2023-03-21 DIAGNOSIS — Z79899 Other long term (current) drug therapy: Secondary | ICD-10-CM | POA: Diagnosis not present

## 2023-03-21 DIAGNOSIS — R29898 Other symptoms and signs involving the musculoskeletal system: Secondary | ICD-10-CM | POA: Diagnosis not present

## 2023-03-21 DIAGNOSIS — R6889 Other general symptoms and signs: Secondary | ICD-10-CM | POA: Diagnosis not present

## 2023-03-21 DIAGNOSIS — M533 Sacrococcygeal disorders, not elsewhere classified: Secondary | ICD-10-CM | POA: Diagnosis not present

## 2023-03-21 DIAGNOSIS — M729 Fibroblastic disorder, unspecified: Secondary | ICD-10-CM | POA: Diagnosis not present

## 2023-03-21 DIAGNOSIS — K5100 Ulcerative (chronic) pancolitis without complications: Secondary | ICD-10-CM | POA: Diagnosis not present

## 2023-03-21 DIAGNOSIS — R768 Other specified abnormal immunological findings in serum: Secondary | ICD-10-CM | POA: Diagnosis not present

## 2023-05-16 ENCOUNTER — Ambulatory Visit (AMBULATORY_SURGERY_CENTER): Payer: 59

## 2023-05-16 VITALS — Ht 62.0 in | Wt 158.0 lb

## 2023-05-16 DIAGNOSIS — K5100 Ulcerative (chronic) pancolitis without complications: Secondary | ICD-10-CM

## 2023-05-16 MED ORDER — ONDANSETRON HCL 4 MG PO TABS
4.0000 mg | ORAL_TABLET | ORAL | 0 refills | Status: DC
Start: 2023-05-16 — End: 2023-06-21

## 2023-05-16 MED ORDER — SUTAB 1479-225-188 MG PO TABS
12.0000 | ORAL_TABLET | ORAL | 0 refills | Status: DC
Start: 2023-05-16 — End: 2023-06-16

## 2023-05-16 NOTE — Progress Notes (Signed)
No egg or soy allergy known to patient  No issues known to pt with past sedation with any surgeries or procedures Patient denies ever being told they had issues or difficulty with intubation  No FH of Malignant Hyperthermia Pt is not on diet pills Pt is not on  home 02  Pt is not on blood thinners  Pt reports drug induced constipation with victoza instructions given for extra miralax  No A fib or A flutter Have any cardiac testing pending--no  LOA: independent  Patient's chart reviewed by Cathlyn Parsons CNRA prior to previsit and patient appropriate for the [facility].  Previsit completed and red dot placed by patient's name on their procedure day (on provider's schedule).      PV completed. Prep instructions reviewed with patient. Pt is aware that there could potentially be a out of pocket cost for sutab. SUTAB coupon sent to home address pt understands she must present that coupon at the time of pickup to receive the discount.

## 2023-05-17 DIAGNOSIS — R632 Polyphagia: Secondary | ICD-10-CM | POA: Diagnosis not present

## 2023-05-17 DIAGNOSIS — E663 Overweight: Secondary | ICD-10-CM | POA: Diagnosis not present

## 2023-05-17 DIAGNOSIS — R7301 Impaired fasting glucose: Secondary | ICD-10-CM | POA: Diagnosis not present

## 2023-05-17 DIAGNOSIS — Z6827 Body mass index (BMI) 27.0-27.9, adult: Secondary | ICD-10-CM | POA: Diagnosis not present

## 2023-05-17 DIAGNOSIS — G4709 Other insomnia: Secondary | ICD-10-CM | POA: Diagnosis not present

## 2023-05-17 DIAGNOSIS — Z8639 Personal history of other endocrine, nutritional and metabolic disease: Secondary | ICD-10-CM | POA: Diagnosis not present

## 2023-06-09 ENCOUNTER — Encounter: Payer: 59 | Admitting: Gastroenterology

## 2023-06-14 ENCOUNTER — Encounter: Payer: Self-pay | Admitting: Gastroenterology

## 2023-06-14 DIAGNOSIS — R632 Polyphagia: Secondary | ICD-10-CM | POA: Diagnosis not present

## 2023-06-14 DIAGNOSIS — K760 Fatty (change of) liver, not elsewhere classified: Secondary | ICD-10-CM | POA: Diagnosis not present

## 2023-06-14 DIAGNOSIS — Z8639 Personal history of other endocrine, nutritional and metabolic disease: Secondary | ICD-10-CM | POA: Diagnosis not present

## 2023-06-14 DIAGNOSIS — Z6826 Body mass index (BMI) 26.0-26.9, adult: Secondary | ICD-10-CM | POA: Diagnosis not present

## 2023-06-14 DIAGNOSIS — E663 Overweight: Secondary | ICD-10-CM | POA: Diagnosis not present

## 2023-06-16 ENCOUNTER — Encounter: Payer: Self-pay | Admitting: Gastroenterology

## 2023-06-16 ENCOUNTER — Ambulatory Visit (AMBULATORY_SURGERY_CENTER): Payer: 59 | Admitting: Gastroenterology

## 2023-06-16 ENCOUNTER — Inpatient Hospital Stay (HOSPITAL_COMMUNITY)
Admission: EM | Admit: 2023-06-16 | Discharge: 2023-06-22 | DRG: 329 | Disposition: A | Discharge status: Home / Self Care | Payer: 59 | Attending: General Surgery | Admitting: General Surgery

## 2023-06-16 ENCOUNTER — Emergency Department (HOSPITAL_COMMUNITY): Payer: 59

## 2023-06-16 ENCOUNTER — Telehealth: Payer: Self-pay | Admitting: Gastroenterology

## 2023-06-16 ENCOUNTER — Encounter (HOSPITAL_COMMUNITY): Payer: Self-pay | Admitting: Emergency Medicine

## 2023-06-16 VITALS — BP 117/77 | HR 68 | Temp 99.1°F | Resp 12 | Ht 62.0 in | Wt 158.0 lb

## 2023-06-16 DIAGNOSIS — F5104 Psychophysiologic insomnia: Secondary | ICD-10-CM | POA: Diagnosis present

## 2023-06-16 DIAGNOSIS — Z9049 Acquired absence of other specified parts of digestive tract: Secondary | ICD-10-CM

## 2023-06-16 DIAGNOSIS — G4700 Insomnia, unspecified: Secondary | ICD-10-CM | POA: Diagnosis present

## 2023-06-16 DIAGNOSIS — K5100 Ulcerative (chronic) pancolitis without complications: Secondary | ICD-10-CM | POA: Diagnosis not present

## 2023-06-16 DIAGNOSIS — R55 Syncope and collapse: Secondary | ICD-10-CM | POA: Diagnosis present

## 2023-06-16 DIAGNOSIS — J310 Chronic rhinitis: Secondary | ICD-10-CM | POA: Diagnosis present

## 2023-06-16 DIAGNOSIS — Z87891 Personal history of nicotine dependence: Secondary | ICD-10-CM

## 2023-06-16 DIAGNOSIS — K529 Noninfective gastroenteritis and colitis, unspecified: Secondary | ICD-10-CM | POA: Diagnosis not present

## 2023-06-16 DIAGNOSIS — R569 Unspecified convulsions: Secondary | ICD-10-CM

## 2023-06-16 DIAGNOSIS — D125 Benign neoplasm of sigmoid colon: Secondary | ICD-10-CM | POA: Diagnosis not present

## 2023-06-16 DIAGNOSIS — K439 Ventral hernia without obstruction or gangrene: Secondary | ICD-10-CM | POA: Diagnosis not present

## 2023-06-16 DIAGNOSIS — Z833 Family history of diabetes mellitus: Secondary | ICD-10-CM

## 2023-06-16 DIAGNOSIS — F419 Anxiety disorder, unspecified: Secondary | ICD-10-CM | POA: Diagnosis not present

## 2023-06-16 DIAGNOSIS — E876 Hypokalemia: Secondary | ICD-10-CM | POA: Diagnosis present

## 2023-06-16 DIAGNOSIS — D128 Benign neoplasm of rectum: Secondary | ICD-10-CM

## 2023-06-16 DIAGNOSIS — R918 Other nonspecific abnormal finding of lung field: Secondary | ICD-10-CM | POA: Diagnosis not present

## 2023-06-16 DIAGNOSIS — E785 Hyperlipidemia, unspecified: Secondary | ICD-10-CM

## 2023-06-16 DIAGNOSIS — Z8711 Personal history of peptic ulcer disease: Secondary | ICD-10-CM

## 2023-06-16 DIAGNOSIS — Z743 Need for continuous supervision: Secondary | ICD-10-CM | POA: Diagnosis not present

## 2023-06-16 DIAGNOSIS — R1084 Generalized abdominal pain: Secondary | ICD-10-CM | POA: Diagnosis not present

## 2023-06-16 DIAGNOSIS — Z85820 Personal history of malignant melanoma of skin: Secondary | ICD-10-CM

## 2023-06-16 DIAGNOSIS — K760 Fatty (change of) liver, not elsewhere classified: Secondary | ICD-10-CM | POA: Diagnosis not present

## 2023-06-16 DIAGNOSIS — D62 Acute posthemorrhagic anemia: Secondary | ICD-10-CM | POA: Diagnosis not present

## 2023-06-16 DIAGNOSIS — K9189 Other postprocedural complications and disorders of digestive system: Secondary | ICD-10-CM | POA: Diagnosis not present

## 2023-06-16 DIAGNOSIS — Z83719 Family history of colon polyps, unspecified: Secondary | ICD-10-CM

## 2023-06-16 DIAGNOSIS — D84821 Immunodeficiency due to drugs: Secondary | ICD-10-CM | POA: Diagnosis present

## 2023-06-16 DIAGNOSIS — G4733 Obstructive sleep apnea (adult) (pediatric): Secondary | ICD-10-CM | POA: Diagnosis not present

## 2023-06-16 DIAGNOSIS — M170 Bilateral primary osteoarthritis of knee: Secondary | ICD-10-CM | POA: Diagnosis present

## 2023-06-16 DIAGNOSIS — K631 Perforation of intestine (nontraumatic): Secondary | ICD-10-CM | POA: Diagnosis not present

## 2023-06-16 DIAGNOSIS — Z8042 Family history of malignant neoplasm of prostate: Secondary | ICD-10-CM

## 2023-06-16 DIAGNOSIS — R590 Localized enlarged lymph nodes: Secondary | ICD-10-CM | POA: Diagnosis not present

## 2023-06-16 DIAGNOSIS — K219 Gastro-esophageal reflux disease without esophagitis: Secondary | ICD-10-CM | POA: Diagnosis present

## 2023-06-16 DIAGNOSIS — Z83438 Family history of other disorder of lipoprotein metabolism and other lipidemia: Secondary | ICD-10-CM

## 2023-06-16 DIAGNOSIS — I10 Essential (primary) hypertension: Secondary | ICD-10-CM | POA: Diagnosis not present

## 2023-06-16 DIAGNOSIS — Z7960 Long term (current) use of unspecified immunomodulators and immunosuppressants: Secondary | ICD-10-CM

## 2023-06-16 DIAGNOSIS — J45909 Unspecified asthma, uncomplicated: Secondary | ICD-10-CM | POA: Diagnosis present

## 2023-06-16 DIAGNOSIS — G2581 Restless legs syndrome: Secondary | ICD-10-CM

## 2023-06-16 DIAGNOSIS — Y838 Other surgical procedures as the cause of abnormal reaction of the patient, or of later complication, without mention of misadventure at the time of the procedure: Secondary | ICD-10-CM | POA: Diagnosis present

## 2023-06-16 DIAGNOSIS — R109 Unspecified abdominal pain: Secondary | ICD-10-CM | POA: Diagnosis not present

## 2023-06-16 DIAGNOSIS — Z85828 Personal history of other malignant neoplasm of skin: Secondary | ICD-10-CM

## 2023-06-16 DIAGNOSIS — F32A Depression, unspecified: Secondary | ICD-10-CM | POA: Diagnosis present

## 2023-06-16 DIAGNOSIS — K5900 Constipation, unspecified: Secondary | ICD-10-CM | POA: Diagnosis not present

## 2023-06-16 DIAGNOSIS — K5190 Ulcerative colitis, unspecified, without complications: Secondary | ICD-10-CM | POA: Diagnosis present

## 2023-06-16 DIAGNOSIS — D509 Iron deficiency anemia, unspecified: Secondary | ICD-10-CM | POA: Diagnosis present

## 2023-06-16 DIAGNOSIS — R000 Tachycardia, unspecified: Secondary | ICD-10-CM | POA: Diagnosis not present

## 2023-06-16 DIAGNOSIS — Z888 Allergy status to other drugs, medicaments and biological substances status: Secondary | ICD-10-CM

## 2023-06-16 DIAGNOSIS — R110 Nausea: Secondary | ICD-10-CM | POA: Diagnosis not present

## 2023-06-16 DIAGNOSIS — Z8249 Family history of ischemic heart disease and other diseases of the circulatory system: Secondary | ICD-10-CM

## 2023-06-16 DIAGNOSIS — E119 Type 2 diabetes mellitus without complications: Secondary | ICD-10-CM | POA: Diagnosis present

## 2023-06-16 DIAGNOSIS — Z818 Family history of other mental and behavioral disorders: Secondary | ICD-10-CM

## 2023-06-16 DIAGNOSIS — G4730 Sleep apnea, unspecified: Secondary | ICD-10-CM

## 2023-06-16 DIAGNOSIS — Z79899 Other long term (current) drug therapy: Secondary | ICD-10-CM

## 2023-06-16 DIAGNOSIS — K567 Ileus, unspecified: Secondary | ICD-10-CM | POA: Diagnosis not present

## 2023-06-16 DIAGNOSIS — Z800 Family history of malignant neoplasm of digestive organs: Secondary | ICD-10-CM

## 2023-06-16 LAB — COMPREHENSIVE METABOLIC PANEL
ALT: 21 U/L (ref 0–44)
AST: 23 U/L (ref 15–41)
Albumin: 4.3 g/dL (ref 3.5–5.0)
Alkaline Phosphatase: 47 U/L (ref 38–126)
Anion gap: 9 (ref 5–15)
BUN: 11 mg/dL (ref 6–20)
CO2: 22 mmol/L (ref 22–32)
Calcium: 8.7 mg/dL — ABNORMAL LOW (ref 8.9–10.3)
Chloride: 106 mmol/L (ref 98–111)
Creatinine, Ser: 0.85 mg/dL (ref 0.44–1.00)
GFR, Estimated: >60 mL/min (ref >60)
Glucose, Bld: 123 mg/dL — ABNORMAL HIGH (ref 70–99)
Potassium: 3.3 mmol/L — ABNORMAL LOW (ref 3.5–5.1)
Sodium: 137 mmol/L (ref 135–145)
Total Bilirubin: 0.9 mg/dL (ref 0.3–1.2)
Total Protein: 6.9 g/dL (ref 6.5–8.1)

## 2023-06-16 LAB — CBC WITH DIFFERENTIAL/PLATELET
Abs Immature Granulocytes: 0.02 10*3/uL (ref 0.00–0.07)
Basophils Absolute: 0 10*3/uL (ref 0.0–0.1)
Basophils Relative: 0 %
Eosinophils Absolute: 0 10*3/uL (ref 0.0–0.5)
Eosinophils Relative: 0 %
HCT: 37.3 % (ref 36.0–46.0)
Hemoglobin: 12.6 g/dL (ref 12.0–15.0)
Immature Granulocytes: 0 %
Lymphocytes Relative: 10 %
Lymphs Abs: 0.8 10*3/uL (ref 0.7–4.0)
MCH: 31.7 pg (ref 26.0–34.0)
MCHC: 33.8 g/dL (ref 30.0–36.0)
MCV: 94 fL (ref 80.0–100.0)
Monocytes Absolute: 0.5 10*3/uL (ref 0.1–1.0)
Monocytes Relative: 6 %
Neutro Abs: 6.3 10*3/uL (ref 1.7–7.7)
Neutrophils Relative %: 84 %
Platelets: 258 10*3/uL (ref 150–400)
RBC: 3.97 MIL/uL (ref 3.87–5.11)
RDW: 13.1 % (ref 11.5–15.5)
WBC: 7.5 10*3/uL (ref 4.0–10.5)
nRBC: 0 % (ref 0.0–0.2)

## 2023-06-16 LAB — TYPE AND SCREEN
ABO/RH(D): B NEG
Antibody Screen: NEGATIVE

## 2023-06-16 LAB — LIPASE, BLOOD: Lipase: 44 U/L (ref 11–51)

## 2023-06-16 MED ORDER — HYDROMORPHONE HCL 1 MG/ML IJ SOLN
1.0000 mg | Freq: Once | INTRAMUSCULAR | Status: AC
  Administered 2023-06-16: 1 mg via INTRAVENOUS
  Filled 2023-06-16: qty 1

## 2023-06-16 MED ORDER — ONDANSETRON HCL 4 MG/2ML IJ SOLN
4.0000 mg | Freq: Once | INTRAMUSCULAR | Status: AC
  Administered 2023-06-16: 4 mg via INTRAVENOUS
  Filled 2023-06-16: qty 2

## 2023-06-16 MED ORDER — SODIUM CHLORIDE 0.9 % IV SOLN: 500.0000 mL | Freq: Once | INTRAVENOUS | Status: DC

## 2023-06-16 MED ORDER — IOHEXOL 350 MG/ML SOLN
100.0000 mL | Freq: Once | INTRAVENOUS | Status: AC | PRN
  Administered 2023-06-16: 100 mL via INTRAVENOUS

## 2023-06-16 NOTE — ED Triage Notes (Addendum)
PT had colonscopy today by Dr. Russella Dar and started having belly pain. Took gas x thinking gas. Pain getting worse and worse. Passing a few clots but had some biopsies. Has passed little bit if gas.

## 2023-06-16 NOTE — Patient Instructions (Signed)

## 2023-06-16 NOTE — Progress Notes (Signed)
Pt's states no medical or surgical changes since previsit or office visit. 

## 2023-06-16 NOTE — Progress Notes (Signed)
Sedate, gd SR, tolerated procedure well, VSS, report to RN 

## 2023-06-16 NOTE — Progress Notes (Signed)
Called to room to assist during endoscopic procedure.  Patient ID and intended procedure confirmed with present staff. Received instructions for my participation in the procedure from the performing physician.  

## 2023-06-16 NOTE — Progress Notes (Signed)
History & Physical  Primary Care Physician:  Helane Rima, DO Primary Gastroenterologist: Claudette Head, MD  Impression / Plan:  Ulcerative pancolitis with focal left colon glandular atypia on last colonoscopy for repeat colonoscopy.  CHIEF COMPLAINT: Ulcerative pancolitis  HPI: Melissa Cohen is a 57 y.o. female with ulcerative pancolitis with focal left colon glandular atypia on last colonoscopy for repeat colonoscopy.   Past Medical History:  Diagnosis Date   Allergy    Anemia    Anxiety    Arthritis    Osteo arthritis - bil knees   Asthma    as a young adult related to allergy flare   Atypical nevi 08/25/2018   1. LEFT MID BACK (MILD) - NO TX, 2. RIGHT CHEST (SEVERE) - W/S   Atypical nevi 09/14/2018   MID BACK (SEVERE) - W/S   Atypical nevi 11/13/2018   LEFT POST. NECK INCIDENTRAL MOLE (MILD)   Atypical nevi 11/27/2018   RIGHT CHEST + MARGIN   Atypical nevus 01/02/2018   LEFT UPPER BACK (SEVERE) - W/S   Atypical nevus 01/04/2019   LEFT STERNUM ( MODERATE)   Atypical nevus 01/04/2019   LEFT POST NECK INF. (SEVERE)   Atypical nevus 01/04/2019   RECURRENT MID BACK - [facility] DR. PAVLIS   Back pain    Basal cell carcinoma 06/06/2008   LEFT UPPER SHOULDER PROX. @ [facility]   Blood transfusion without reported diagnosis    from [facility] several   Chest pain    Depression    Fatty liver    Gallbladder problem    Gastric ulcer    GERD (gastroesophageal reflux disease)    Heart murmur    Hyperlipidemia    Hypertension    Iron deficiency anemia    Joint pain    Knee pain    Lower extremity edema    Melanoma (HCC)    right cleavage, posterior left shoulder   MM (malignant melanoma of skin) (HCC) 09/14/2018   LEFT POST. NECK MIS - EXC   MM (malignant melanoma of skin) (HCC) 09/14/2018   RIGHT CHEST MIS - EXC   Obesity    Restless leg syndrome    Seizures (HCC)    2-3 d/t hypogylcermia - last seizure was 1998   Shortness of breath on exertion     Sleep apnea    sleep study showed mild sleep apnea - no c-papa needed   Ulcerative colitis    Dx at age 65; had been 30 years since her last flareup and then had a flareup recently in summer of 2017. Was then started back on Remicade and is doing well since.   Vitamin D deficiency     Past Surgical History:  Procedure Laterality Date   APPENDECTOMY     BREAST ENHANCEMENT SURGERY  2004   CHOLECYSTECTOMY  2011   COLONOSCOPY  2019   right eye lasix     02/17/12   TONSILLECTOMY AND ADENOIDECTOMY     TUBAL LIGATION     UPPER GASTROINTESTINAL ENDOSCOPY     Uterine ablation     WISDOM TOOTH EXTRACTION      Prior to Admission medications   Medication Sig Start Date End Date Taking? Authorizing Provider  albuterol (VENTOLIN HFA) 108 (90 Base) MCG/ACT inhaler Inhale 2 puffs into the lungs every 6 (six) hours as needed for wheezing or shortness of breath. 01/03/23   Sheliah Hatch, MD  B Complex-C (SUPER B COMPLEX PO) Take 1 tablet  by mouth every other day.    [provider]  Biotin 16109 MCG TABS Take by mouth.    [provider]  buPROPion (WELLBUTRIN XL) 300 MG 24 hr tablet Take 1 tablet (300 mg total) by mouth daily. 12/06/22   Sheliah Hatch, MD  celecoxib (CELEBREX) 200 MG capsule Take 1 capsule (200 mg total) by mouth 2 (two) times daily. 12/06/22   Sheliah Hatch, MD  Cholecalciferol (VITAMIN D3) 125 MCG (5000 UT) CAPS Take 1 capsule by mouth daily.    [provider]  esomeprazole (NEXIUM) 40 MG capsule Take 1 capsule (40 mg total) by mouth daily. 03/15/23   Meryl Dare, MD  famotidine (PEPCID) 40 MG tablet TAKE ONE TABLET BY MOUTH ONE TIME DAILY Patient taking differently: Take 40 mg by mouth daily as needed. 02/25/22   Meryl Dare, MD  gabapentin (NEURONTIN) 300 MG capsule Take 1 capsule (300 mg total) by mouth 3 (three) times daily. Patient not taking: Reported on 05/16/2023 12/06/22   Sheliah Hatch, MD  Insulin Pen Needle 32G X  4 MM MISC 1 each by Does not apply route daily. 10/20/20   Helane Rima, DO  ondansetron (ZOFRAN) 4 MG tablet Take 1 tablet (4 mg total) by mouth as directed. 05/16/23   Meryl Dare, MD  ondansetron (ZOFRAN-ODT) 4 MG disintegrating tablet PLACE ONE TABLET ON THE TONGUE EVERY 6 HOURS AS NEEDED FOR NAUSEA AND VOMITING 11/26/21   Helane Rima, DO  phentermine (ADIPEX-P) 37.5 MG tablet Take 37.5 mg by mouth every morning. 04/18/23   [provider]  Probiotic Product (PROBIOTIC DAILY PO) Take 1 capsule by mouth daily.    [provider]  simethicone (MYLICON) 125 MG chewable tablet Chew 125 mg by mouth every 6 (six) hours as needed for flatulence. Patient not taking: Reported on 05/16/2023    [provider]  Sodium Sulfate-Mag Sulfate-KCl (SUTAB) [id number] MG TABS Take 12 tablets by mouth as directed. 05/16/23   Meryl Dare, MD  Upadacitinib ER (RINVOQ) 30 MG TB24 Take 1 tablet (30 mg total) by mouth daily. 03/15/23   Meryl Dare, MD  VICTOZA 18 MG/3ML SOPN Inject 1.8 mg into the skin daily.    [provider]  zolpidem (AMBIEN) 5 MG tablet Take 5 mg by mouth at bedtime. 04/18/23   [provider]    Current Outpatient Medications  Medication Sig Dispense Refill   albuterol (VENTOLIN HFA) 108 (90 Base) MCG/ACT inhaler Inhale 2 puffs into the lungs every 6 (six) hours as needed for wheezing or shortness of breath. 8 g 0   B Complex-C (SUPER B COMPLEX PO) Take 1 tablet by mouth every other day.     Biotin 91478 MCG TABS Take by mouth.     buPROPion (WELLBUTRIN XL) 300 MG 24 hr tablet Take 1 tablet (300 mg total) by mouth daily. 90 tablet 1   celecoxib (CELEBREX) 200 MG capsule Take 1 capsule (200 mg total) by mouth 2 (two) times daily. 180 capsule 0   Cholecalciferol (VITAMIN D3) 125 MCG (5000 UT) CAPS Take 1 capsule by mouth daily.     esomeprazole (NEXIUM) 40 MG capsule Take 1 capsule (40 mg total) by mouth daily. 90 capsule 3    famotidine (PEPCID) 40 MG tablet TAKE ONE TABLET BY MOUTH ONE TIME DAILY (Patient taking differently: Take 40 mg by mouth daily as needed.) 90 tablet 3   gabapentin (NEURONTIN) 300 MG capsule Take 1 capsule (300  mg total) by mouth 3 (three) times daily. (Patient not taking: Reported on 05/16/2023) 90 capsule 3   Insulin Pen Needle 32G X 4 MM MISC 1 each by Does not apply route daily. 100 each 0   ondansetron (ZOFRAN) 4 MG tablet Take 1 tablet (4 mg total) by mouth as directed. 2 tablet 0   ondansetron (ZOFRAN-ODT) 4 MG disintegrating tablet PLACE ONE TABLET ON THE TONGUE EVERY 6 HOURS AS NEEDED FOR NAUSEA AND VOMITING 20 tablet 0   phentermine (ADIPEX-P) 37.5 MG tablet Take 37.5 mg by mouth every morning.     Probiotic Product (PROBIOTIC DAILY PO) Take 1 capsule by mouth daily.     simethicone (MYLICON) 125 MG chewable tablet Chew 125 mg by mouth every 6 (six) hours as needed for flatulence. (Patient not taking: Reported on 05/16/2023)     Sodium Sulfate-Mag Sulfate-KCl (SUTAB) [phone] MG TABS Take 12 tablets by mouth as directed. 24 tablet 0   Upadacitinib ER (RINVOQ) 30 MG TB24 Take 1 tablet (30 mg total) by mouth daily. 30 tablet 3   VICTOZA 18 MG/3ML SOPN Inject 1.8 mg into the skin daily.     zolpidem (AMBIEN) 5 MG tablet Take 5 mg by mouth at bedtime.     Current Facility-Administered Medications  Medication Dose Route Frequency Provider Last Rate Last Admin   0.9 %  sodium chloride infusion  500 mL Intravenous Once Meryl Dare, MD        Allergies as of 06/16/2023 - Review Complete 06/16/2023  Allergen Reaction Noted   Tagamet [cimetidine] Anaphylaxis 02/09/2007   Effexor [venlafaxine]  10/06/2018   Mobic [meloxicam] Swelling 12/18/2015   Ultram [tramadol] Swelling 09/07/2016    Family History  Problem Relation Age of Onset   Prostate cancer Father    Hyperlipidemia Father    Barrett's esophagus Father    Cancer Father        prostate   Colon cancer Father 89        appendical adenocarcinoma 2011   Hypertension Father    Diabetes Sister    Diabetes Other        Grandparents   Heart attack Maternal Grandfather    Heart disease Maternal Grandfather    Colitis Maternal Grandfather    Ulcerative colitis Mother    Hypertension Mother    Hyperlipidemia Mother    Depression Mother    Colon polyps Mother    Ulcerative colitis Maternal Grandmother    Crohn's disease Brother    Esophageal cancer Neg Hx    Rectal cancer Neg Hx    Stomach cancer Neg Hx    Allergic rhinitis Neg Hx    Angioedema Neg Hx    Asthma Neg Hx    Atopy Neg Hx    Eczema Neg Hx    Immunodeficiency Neg Hx     Social History   Socioeconomic History   Marital status: Significant Other    Spouse name: Not on file   Number of children: 3   Years of education: Not on file   Highest education level: Not on file  Occupational History   Occupation: pet care provider    Employer: [employer]  Tobacco Use   Smoking status: Former    Current packs/day: 0.00    Average packs/day: 0.5 packs/day for 25.0 years (12.5 ttl pk-yrs)    Types: Cigarettes    Start date: 02/27/1981    Quit date: 02/27/2006    Years since quitting: 17.3   Smokeless tobacco:  Never  Vaping Use   Vaping status: Never Used  Substance and Sexual Activity   Alcohol use: Yes    Alcohol/week: 3.0 standard drinks of alcohol    Types: 3 Glasses of wine per week    Comment: every other weekend socially   Drug use: No   Sexual activity: Yes  Other Topics Concern   Not on file  Social History Narrative   1-2 cups of tea a day.   Currently lives with her partner having. She divorced. She likes to walk daily doing exercise. She now does pet care.   Social Determinants of Health   Financial Resource Strain: Not on file  Food Insecurity: No Food Insecurity (03/21/2023)   Received from [facility], [facility]   Hunger Vital Sign    Worried About Running Out of Food in the Last  Year: Never true    Ran Out of Food in the Last Year: Never true  Transportation Needs: No Transportation Needs (03/21/2023)   Received from [facility], [facility]   [facility] - Transportation    In the past 12 months, has lack of transportation kept you from medical appointments or from getting medications?: No    Lack of Transportation (Non-Medical): No  Physical Activity: Not on file  Stress: Not on file  Social Connections: Unknown (04/13/2022)   Received from [facility], [facility]   Social Network    Social Network: Not on file  Intimate Partner Violence: Unknown (03/05/2022)   Received from [facility], [facility]   HITS    Physically Hurt: Not on file    Insult or Talk Down To: Not on file    Threaten Physical Harm: Not on file    Scream or Curse: Not on file    Review of Systems:  All systems reviewed were negative except where noted in HPI.   Physical Exam:  General:  Alert, well-developed, in NAD Head:  Normocephalic and atraumatic. Eyes:  Sclera clear, no icterus.   Conjunctiva pink. Ears:  Normal auditory acuity. Mouth:  No deformity or lesions.  Neck:  Supple; no masses. Lungs:  Clear throughout to auscultation.   No wheezes, crackles, or rhonchi.  Heart:  Regular rate and rhythm; no murmurs. Abdomen:  Soft, nondistended, nontender. No masses, hepatomegaly. No palpable masses.  Normal bowel sounds.    Rectal:  Deferred   Msk:  Symmetrical without gross deformities. Extremities:  Without edema. Neurologic:  Alert and  oriented x 4; grossly normal neurologically. Skin:  Intact without significant lesions or rashes. Psych:  Alert and cooperative. Normal mood and affect.   Venita Lick. Russella Dar  06/16/2023, 2:59 PM See Loretha Stapler, [facility], to contact our on call provider

## 2023-06-16 NOTE — ED Provider Notes (Signed)
[place] EMERGENCY DEPARTMENT AT [facility] Provider Note   CSN: [id number] Arrival date & time: 06/16/23  2138     History  Chief Complaint  Patient presents with   Abdominal Pain    Melissa Cohen is a 57 y.o. female.  The history is provided by the patient and medical records.  Abdominal Pain  57 y.o. F with hx of ulcerative colitis, GERD, restless legs, anxiety, iron deficiency anemia, history of gastric ulcers, presenting to the ED with abdominal pain and blood in stool.  Patient had colonoscopy today for routine monitoring of her ulcerative colitis.  She did have a few biopsies taken.  She did eat a [employer] sandwich on the way home after her procedure, no issues with that.  Upon returning home she started to have loose stools-- seems grossly bloody.  She reports she thinks there were some small clots but no large pieces of tissue, etc.  She is not having bleeding without urge for BM.  She did try taking some gas-x thinking it was just gas pains, however seems worse.  Pain mostly along right side of her abdomen.  No fever/chills.  No vomiting.  Home Medications Prior to Admission medications   Medication Sig Start Date End Date Taking? Authorizing Provider  albuterol (VENTOLIN HFA) 108 (90 Base) MCG/ACT inhaler Inhale 2 puffs into the lungs every 6 (six) hours as needed for wheezing or shortness of breath. 01/03/23   Sheliah Hatch, MD  B Complex-C (SUPER B COMPLEX PO) Take 1 tablet by mouth every other day.    [provider]  Biotin 47829 MCG TABS Take by mouth.    [provider]  buPROPion (WELLBUTRIN XL) 300 MG 24 hr tablet Take 1 tablet (300 mg total) by mouth daily. 12/06/22   Sheliah Hatch, MD  celecoxib (CELEBREX) 200 MG capsule Take 1 capsule (200 mg total) by mouth 2 (two) times daily. 12/06/22   Sheliah Hatch, MD  Cholecalciferol (VITAMIN D3) 125 MCG (5000 UT) CAPS Take 1 capsule by mouth daily.    [provider]  esomeprazole (NEXIUM) 40 MG capsule Take 1 capsule (40 mg total) by mouth daily. 03/15/23   Meryl Dare, MD  famotidine (PEPCID) 40 MG tablet TAKE ONE TABLET BY MOUTH ONE TIME DAILY Patient taking differently: Take 40 mg by mouth daily as needed. 02/25/22   Meryl Dare, MD  gabapentin (NEURONTIN) 300 MG capsule Take 1 capsule (300 mg total) by mouth 3 (three) times daily. Patient not taking: Reported on 05/16/2023 12/06/22   Sheliah Hatch, MD  Insulin Pen Needle 32G X 4 MM MISC 1 each by Does not apply route daily. 10/20/20   Helane Rima, DO  ondansetron (ZOFRAN) 4 MG tablet Take 1 tablet (4 mg total) by mouth as directed. 05/16/23   Meryl Dare, MD  ondansetron (ZOFRAN-ODT) 4 MG disintegrating tablet PLACE ONE TABLET ON THE TONGUE EVERY 6 HOURS AS NEEDED FOR NAUSEA AND VOMITING 11/26/21   Helane Rima, DO  phentermine (ADIPEX-P) 37.5 MG tablet Take 37.5 mg by mouth every morning. 04/18/23   [provider]  Probiotic Product (PROBIOTIC DAILY PO) Take 1 capsule by mouth daily. Patient not taking: Reported on 06/16/2023    [provider]  simethicone (MYLICON) 125 MG chewable tablet Chew 125 mg by mouth every 6 (six) hours as needed for flatulence.    [provider]  Upadacitinib ER (RINVOQ) 30 MG TB24 Take 1 tablet (30  mg total) by mouth daily. 03/15/23   Meryl Dare, MD  VICTOZA 18 MG/3ML SOPN Inject 1.8 mg into the skin daily.    [provider]  zolpidem (AMBIEN) 5 MG tablet Take 5 mg by mouth at bedtime. 04/18/23   [provider]      Allergies    Tagamet [cimetidine], Effexor [venlafaxine], Mobic [meloxicam], and Ultram [tramadol]    Review of Systems   Review of Systems  Gastrointestinal:  Positive for abdominal pain and blood in stool.  All other systems reviewed and are negative.   Physical Exam Updated Vital Signs BP 130/77   Pulse (!) 104   Temp 98.6 F (37 C)   Resp 19   LMP 09/02/2013   SpO2 97%    Physical Exam Vitals and nursing note reviewed.  Constitutional:      Appearance: She is well-developed.     Comments: Appears uncomfortable  HENT:     Head: Normocephalic and atraumatic.  Eyes:     Conjunctiva/sclera: Conjunctivae normal.     Pupils: Pupils are equal, round, and reactive to light.  Cardiovascular:     Rate and Rhythm: Normal rate and regular rhythm.     Heart sounds: Normal heart sounds.  Pulmonary:     Effort: Pulmonary effort is normal.     Breath sounds: Normal breath sounds.  Abdominal:     General: Bowel sounds are normal.     Palpations: Abdomen is soft.     Tenderness: There is abdominal tenderness in the right upper quadrant and right lower quadrant.     Comments: Pain along right side of abdomen, worse with pressure applied or trying to lay on right side  Musculoskeletal:        General: Normal range of motion.     Cervical back: Normal range of motion.  Skin:    General: Skin is warm and dry.  Neurological:     Mental Status: She is alert and oriented to person, place, and time.     ED Results / Procedures / Treatments   Labs (all labs ordered are listed, but only abnormal results are displayed) Labs Reviewed  COMPREHENSIVE METABOLIC PANEL - Abnormal; Notable for the following components:      Result Value   Potassium 3.3 (*)    Glucose, Bld 123 (*)    Calcium 8.7 (*)    All other components within normal limits  CBC WITH DIFFERENTIAL/PLATELET  LIPASE, BLOOD  TYPE AND SCREEN  ABO/RH    EKG None  Radiology CT ANGIO GI BLEED  Result Date: 06/17/2023 CLINICAL DATA:  Colonoscopy today. Abdominal pain. Biopsies were taken during the colonoscopy. Passing a few clots. EXAM: CTA ABDOMEN AND PELVIS WITHOUT AND WITH CONTRAST TECHNIQUE: Multidetector CT imaging of the abdomen and pelvis was performed using the standard protocol during bolus administration of intravenous contrast. Multiplanar reconstructed images and MIPs were obtained and  reviewed to evaluate the vascular anatomy. RADIATION DOSE REDUCTION: This exam was performed according to the departmental dose-optimization program which includes automated exposure control, adjustment of the mA and/or kV according to patient size and/or use of iterative reconstruction technique. CONTRAST:  OMNIPAQUE IOHEXOL 350 MG/ML SOLN COMPARISON:  MRI abdomen 04/15/2022 FINDINGS: VASCULAR Aorta: Normal caliber aorta without aneurysm, dissection, vasculitis or significant stenosis. Celiac: Patent without evidence of aneurysm, dissection, vasculitis or significant stenosis. SMA: Patent without evidence of aneurysm, dissection, vasculitis or significant stenosis. Renals: Both renal arteries are patent without evidence of aneurysm, dissection, vasculitis,  fibromuscular dysplasia or significant stenosis. IMA: Patent without evidence of aneurysm, dissection, vasculitis or significant stenosis. Inflow: Patent without evidence of aneurysm, dissection, vasculitis or significant stenosis. Proximal Outflow: Bilateral common femoral and visualized portions of the superficial and profunda femoral arteries are patent without evidence of aneurysm, dissection, vasculitis or significant stenosis. Veins: No obvious venous abnormality within the limitations of this arterial phase study. Review of the MIP images confirms the above findings. NON-VASCULAR Lower chest: Partially visualized pneumomediastinum likely from air dissecting superiorly from pneumoperitoneum. Hepatobiliary: Cholecystectomy. No biliary dilation. No focal hepatic lesion. Pancreas: Unremarkable. Spleen: Unremarkable. Adrenals/Urinary Tract: Unremarkable adrenal glands. No urinary calculi or hydronephrosis. Unremarkable bladder. Stomach/Bowel: Large volume free intraperitoneal and retroperitoneal air. The distribution of free air is greatest in the right hemiabdomen suggesting the site of perforation may be near the hepatic flexure however no definite wall  discontinuity is observed. No evidence of active bleeding. There is mild stranding along the colon at the hepatic flexure. Normal caliber large and small bowel. Stomach is within normal limits. Lymphatic: Right ileocolic lymph nodes are mildly enlarged measuring up to 11 mm (series 6/image 123). Reproductive: No acute abnormality. Other: Small amount of low-density free fluid in the pelvis. Left paramidline ventral abdominal wall hernia containing fat and gas. Musculoskeletal: No acute fracture. IMPRESSION: 1. Large volume free intraperitoneal and retroperitoneal air. The distribution of free air is greatest in the right hemiabdomen suggesting the site of perforation may be near the hepatic flexure however no definite wall discontinuity is observed. 2. No evidence of active bleeding. 3. Partially visualized pneumomediastinum likely from air dissecting superiorly from pneumoperitoneum. 4. Indeterminate mildly enlarged right ileocolic lymph nodes. Critical Value/emergent results were called by telephone at the time of interpretation on 06/16/2023 at 11:59 pm to provider [facility] , who verbally acknowledged these results. Electronically Signed   By: Minerva Fester M.D.   On: 06/17/2023 00:10    Procedures Procedures    CRITICAL CARE Performed by: Garlon Hatchet   Total critical care time: 45 minutes  Critical care time was exclusive of separately billable procedures and treating other patients.  Critical care was necessary to treat or prevent imminent or life-threatening deterioration.  Critical care was time spent personally by me on the following activities: development of treatment plan with patient and/or surrogate as well as nursing, discussions with consultants, evaluation of patient's response to treatment, examination of patient, obtaining history from patient or surrogate, ordering and performing treatments and interventions, ordering and review of laboratory studies, ordering and review of  radiographic studies, pulse oximetry and re-evaluation of patient's condition.   Medications Ordered in ED Medications  piperacillin-tazobactam (ZOSYN) IVPB 3.375 g (has no administration in time range)  HYDROmorphone (DILAUDID) injection 0.5 mg (has no administration in time range)  HYDROmorphone (DILAUDID) injection 1 mg (1 mg Intravenous Given 06/16/23 2240)  ondansetron (ZOFRAN) injection 4 mg (4 mg Intravenous Given 06/16/23 2240)  iohexol (OMNIPAQUE) 350 MG/ML injection 100 mL (100 mLs Intravenous Contrast Given 06/16/23 2317)    ED Course/ Medical Decision Making/ A&P                             Medical Decision Making Amount and/or Complexity of Data Reviewed Labs: ordered. Radiology: ordered and independent interpretation performed. ECG/medicine tests: ordered and independent interpretation performed.  Risk Prescription drug management. Decision regarding hospitalization.   57 y.o. F here with bleeding and worsening abdominal pain after colonoscopy earlier today.  States never experienced this before.  She did have some biopsies taken.  She is afebrile and nontoxic but does appear comfortable.  Majority of her pain is along the right side of her abdomen.  Will check labs, CT to evaluate for active bleed, bowel perf, or other complications.  12:01 AM Spoke with radiology-- lots of free air on CT, concern for bowel perforation.  Patient updated, she is more comfortable after pain meds but still a lot of pain with any movement. No vomiting.  No further bleeding.  Remains HD stable.  Given additional dose of pain meds (1/2 at her request) and will page general surgery.  12:13 AM Spoke with general surgery, Dr. Maisie Fus-- will evaluate, plan for OR.  Final Clinical Impression(s) / ED Diagnoses Final diagnoses:  Bowel perforation [place])    Rx / DC Orders ED Discharge Orders     None         Garlon Hatchet, PA-C 06/17/23 0055    Terald Sleeper, MD 06/17/23  [phone]

## 2023-06-16 NOTE — Telephone Encounter (Signed)
Patient called through answering service this evening c/o bloating and feeling of trapped gas since this afternoon's colonoscopy.  Has passed a little air and a small amount of loose stool since returning home (blood-tinged fluid per rectum due to Bx).  No vomiting or fever.  Took one simethicone about an hour ago without much improvement.  Procedure report reviewed - uncomplicated colonoscopy to TI for UC - Bx taken.  Advised patient unlikely major complication of procedure.  Most likely air trapped in small bowel. Advised take another simethcone and use a heating pad, trying to ambulate as much as possible until more air passes and pain relieved.  Instructed to call back of pain escalating or if develops fever or vomiting.  Please have your nurse call her in AM to check on condition.  - H. Danis

## 2023-06-16 NOTE — Op Note (Signed)
[facility] Patient Name: Rayden Scheper Procedure Date: 06/16/2023 3:16 PM MRN: [id number] Endoscopist: Meryl Dare , MD, [phone] Age: 57 Referring MD:  Date of Birth: 11-Mar-1966 Gender: Female Account #: [id number] Procedure:                Colonoscopy Indications:              High risk colon cancer surveillance: Ulcerative                            pancolitis of 8 (or more) years duration Medicines:                Monitored Anesthesia Care Procedure:                Pre-Anesthesia Assessment:                           - Prior to the procedure, a History and Physical                            was performed, and patient medications and                            allergies were reviewed. The patient's tolerance of                            previous anesthesia was also reviewed. The risks                            and benefits of the procedure and the sedation                            options and risks were discussed with the patient.                            All questions were answered, and informed consent                            was obtained. Prior Anticoagulants: The patient has                            taken no anticoagulant or antiplatelet agents. ASA                            Grade Assessment: II - A patient with mild systemic                            disease. After reviewing the risks and benefits,                            the patient was deemed in satisfactory condition to                            undergo the procedure.  After obtaining informed consent, the colonoscope                            was passed under direct vision. Throughout the                            procedure, the patient's blood pressure, pulse, and                            oxygen saturations were monitored continuously. The                            CF [id number] #[id number] was introduced through the anus                            and advanced  to the the terminal ileum, with                            identification of the appendiceal orifice and IC                            valve. The terminal ileum, ileocecal valve,                            appendiceal orifice, and rectum were photographed.                            The quality of the bowel preparation was adequate                            after substantial lavage, suction. The colonoscopy                            was performed without difficulty. The patient                            tolerated the procedure well. Scope In: 3:21:18 PM Scope Out: 3:36:08 PM Scope Withdrawal Time: 0 hours 10 minutes 48 seconds  Total Procedure Duration: 0 hours 14 minutes 50 seconds  Findings:                 The perianal and digital rectal examinations were                            normal.                           The terminal ileum contained a few localized                            non-bleeding erosions. No stigmata of recent                            bleeding were seen. The IC valve was widely patent.  Query backwash ileitis. Biopsies were taken with a                            cold forceps for histology.                           Inflammation was found as medium patches with                            erythema and scarring surrounded by normal mucosa                            in the rectum, in the sigmoid colon, in the                            descending colon, in the splenic flexure, in the                            transverse colon, in the hepatic flexure, in the                            ascending colon and at the cecum. This was graded                            using the Ulcerative Colitis Endoscopic Index of                            Severity (UCEIS): patchy obliteration of vascular                            pattern (1), no visible blood (0), normal mucosa                            without visible erosions or ulcers (0). When                             compared to the previous examination, the findings                            are unchanged. Biopsies were taken with a cold                            forceps for histology.                           Multiple sessile and semi-pedunculated pseudopolyps                            were found in the rectum, sigmoid colon and                            descending colon. The polyps were small in size.  External and internal hemorrhoids were found during                            retroflexion. The hemorrhoids were small and Grade                            I (internal hemorrhoids that do not prolapse).                           The exam was otherwise without abnormality on                            direct and retroflexion views. Complications:            No immediate complications. Estimated blood loss:                            None. Estimated Blood Loss:     Estimated blood loss: none. Impression:               - A few erosions in the terminal ileum. Biopsied.                           - Pancolitis ulcerative colitis, unchanged since                            last examination. Biopsied.                           - Multiple small pseudopolyps in the rectum and in                            the sigmoid colon.                           - External and internal hemorrhoids.                           - The examination was otherwise normal on direct                            and retroflexion views. Recommendation:           - Repeat colonoscopy date to be determined after                            pending pathology results are reviewed for                            surveillance based on pathology results with an                            extended bowel prep.                           - Patient has a contact number available for  emergencies. The signs and symptoms of potential                            delayed  complications were discussed with the                            patient. Return to normal activities tomorrow.                            Written discharge instructions were provided to the                            patient.                           - Resume previous diet.                           - Continue present medications.                           - Await pathology results.                           - Return to GI office in 6 months. Meryl Dare, MD 06/16/2023 3:46:21 PM This report has been signed electronically.

## 2023-06-16 NOTE — Telephone Encounter (Signed)
Patient's roommate called back at 830 pm reporting that Melissa Cohen was having escalating and "excruciating" generalized abdominal pain.  I recommended that Melissa Cohen be brought immediately to either the [facility] or [facility] Emergency Department.  I was unable to reach triage at either ED. ED doc should call me after evaluating her.  - HD

## 2023-06-17 ENCOUNTER — Inpatient Hospital Stay: Admit: 2023-06-17 | Payer: 59 | Admitting: General Surgery

## 2023-06-17 ENCOUNTER — Encounter (HOSPITAL_COMMUNITY): Admission: EM | Disposition: A | Discharge status: Home / Self Care | Payer: Self-pay | Source: Home / Self Care

## 2023-06-17 ENCOUNTER — Emergency Department (HOSPITAL_COMMUNITY): Payer: 59 | Admitting: Certified Registered"

## 2023-06-17 ENCOUNTER — Inpatient Hospital Stay (HOSPITAL_COMMUNITY): Payer: 59

## 2023-06-17 ENCOUNTER — Telehealth: Payer: Self-pay | Admitting: *Deleted

## 2023-06-17 ENCOUNTER — Other Ambulatory Visit: Payer: Self-pay

## 2023-06-17 ENCOUNTER — Telehealth: Payer: Self-pay | Admitting: Gastroenterology

## 2023-06-17 DIAGNOSIS — Z8249 Family history of ischemic heart disease and other diseases of the circulatory system: Secondary | ICD-10-CM | POA: Diagnosis not present

## 2023-06-17 DIAGNOSIS — R569 Unspecified convulsions: Secondary | ICD-10-CM | POA: Diagnosis not present

## 2023-06-17 DIAGNOSIS — K631 Perforation of intestine (nontraumatic): Secondary | ICD-10-CM | POA: Diagnosis not present

## 2023-06-17 DIAGNOSIS — I10 Essential (primary) hypertension: Secondary | ICD-10-CM

## 2023-06-17 DIAGNOSIS — E785 Hyperlipidemia, unspecified: Secondary | ICD-10-CM | POA: Diagnosis not present

## 2023-06-17 DIAGNOSIS — Y838 Other surgical procedures as the cause of abnormal reaction of the patient, or of later complication, without mention of misadventure at the time of the procedure: Secondary | ICD-10-CM | POA: Diagnosis not present

## 2023-06-17 DIAGNOSIS — S36531A Laceration of transverse colon, initial encounter: Secondary | ICD-10-CM | POA: Diagnosis not present

## 2023-06-17 DIAGNOSIS — G4730 Sleep apnea, unspecified: Secondary | ICD-10-CM | POA: Diagnosis not present

## 2023-06-17 DIAGNOSIS — K9189 Other postprocedural complications and disorders of digestive system: Secondary | ICD-10-CM | POA: Diagnosis not present

## 2023-06-17 DIAGNOSIS — F32A Depression, unspecified: Secondary | ICD-10-CM | POA: Diagnosis not present

## 2023-06-17 DIAGNOSIS — K5100 Ulcerative (chronic) pancolitis without complications: Secondary | ICD-10-CM | POA: Diagnosis not present

## 2023-06-17 DIAGNOSIS — J45909 Unspecified asthma, uncomplicated: Secondary | ICD-10-CM | POA: Diagnosis not present

## 2023-06-17 DIAGNOSIS — Z8042 Family history of malignant neoplasm of prostate: Secondary | ICD-10-CM | POA: Diagnosis not present

## 2023-06-17 DIAGNOSIS — E119 Type 2 diabetes mellitus without complications: Secondary | ICD-10-CM | POA: Diagnosis not present

## 2023-06-17 DIAGNOSIS — K219 Gastro-esophageal reflux disease without esophagitis: Secondary | ICD-10-CM | POA: Diagnosis not present

## 2023-06-17 DIAGNOSIS — Z85828 Personal history of other malignant neoplasm of skin: Secondary | ICD-10-CM | POA: Diagnosis not present

## 2023-06-17 DIAGNOSIS — R112 Nausea with vomiting, unspecified: Secondary | ICD-10-CM | POA: Diagnosis not present

## 2023-06-17 DIAGNOSIS — K567 Ileus, unspecified: Secondary | ICD-10-CM | POA: Diagnosis not present

## 2023-06-17 DIAGNOSIS — Z9049 Acquired absence of other specified parts of digestive tract: Secondary | ICD-10-CM | POA: Diagnosis not present

## 2023-06-17 DIAGNOSIS — Z85820 Personal history of malignant melanoma of skin: Secondary | ICD-10-CM | POA: Diagnosis not present

## 2023-06-17 DIAGNOSIS — Z83438 Family history of other disorder of lipoprotein metabolism and other lipidemia: Secondary | ICD-10-CM | POA: Diagnosis not present

## 2023-06-17 DIAGNOSIS — Z87891 Personal history of nicotine dependence: Secondary | ICD-10-CM

## 2023-06-17 DIAGNOSIS — D509 Iron deficiency anemia, unspecified: Secondary | ICD-10-CM | POA: Diagnosis not present

## 2023-06-17 DIAGNOSIS — R109 Unspecified abdominal pain: Secondary | ICD-10-CM | POA: Diagnosis not present

## 2023-06-17 DIAGNOSIS — E876 Hypokalemia: Secondary | ICD-10-CM | POA: Diagnosis not present

## 2023-06-17 DIAGNOSIS — F419 Anxiety disorder, unspecified: Secondary | ICD-10-CM | POA: Diagnosis not present

## 2023-06-17 DIAGNOSIS — D84821 Immunodeficiency due to drugs: Secondary | ICD-10-CM | POA: Diagnosis not present

## 2023-06-17 DIAGNOSIS — D62 Acute posthemorrhagic anemia: Secondary | ICD-10-CM | POA: Diagnosis not present

## 2023-06-17 DIAGNOSIS — G2581 Restless legs syndrome: Secondary | ICD-10-CM | POA: Diagnosis not present

## 2023-06-17 HISTORY — PX: LAPAROTOMY: SHX154

## 2023-06-17 LAB — ABO/RH: ABO/RH(D): B NEG

## 2023-06-17 SURGERY — LAPAROTOMY, EXPLORATORY
Anesthesia: General | Site: Abdomen

## 2023-06-17 MED ORDER — ENSURE SURGERY PO LIQD
237.0000 mL | Freq: Two times a day (BID) | ORAL | Status: DC
  Administered 2023-06-18 – 2023-06-21 (×8): 237 mL via ORAL
  Filled 2023-06-17 (×6): qty 237

## 2023-06-17 MED ORDER — HYDROMORPHONE HCL 1 MG/ML IJ SOLN: 1.0000 mg | Freq: Once | INTRAMUSCULAR | Status: DC

## 2023-06-17 MED ORDER — GABAPENTIN 100 MG PO CAPS
300.0000 mg | ORAL_CAPSULE | Freq: Three times a day (TID) | ORAL | Status: DC
  Administered 2023-06-17 – 2023-06-22 (×15): 300 mg via ORAL
  Filled 2023-06-17: qty 3
  Filled 2023-06-17: qty 1
  Filled 2023-06-17: qty 3
  Filled 2023-06-17 (×2): qty 1
  Filled 2023-06-17: qty 3
  Filled 2023-06-17: qty 1
  Filled 2023-06-17 (×3): qty 3
  Filled 2023-06-17: qty 1
  Filled 2023-06-17 (×4): qty 3

## 2023-06-17 MED ORDER — LACTATED RINGERS IV SOLN: INTRAVENOUS | Status: DC | PRN

## 2023-06-17 MED ORDER — DIPHENHYDRAMINE HCL 50 MG/ML IJ SOLN: 12.5000 mg | Freq: Four times a day (QID) | INTRAMUSCULAR | Status: DC | PRN

## 2023-06-17 MED ORDER — AMISULPRIDE (ANTIEMETIC) 5 MG/2ML IV SOLN: 10.0000 mg | Freq: Once | INTRAVENOUS | Status: DC | PRN

## 2023-06-17 MED ORDER — PROCHLORPERAZINE EDISYLATE 10 MG/2ML IJ SOLN
5.0000 mg | INTRAMUSCULAR | Status: DC | PRN
  Administered 2023-06-19: 10 mg via INTRAVENOUS
  Administered 2023-06-20: 5 mg via INTRAVENOUS
  Filled 2023-06-17 (×2): qty 2

## 2023-06-17 MED ORDER — ALBUTEROL SULFATE (2.5 MG/3ML) 0.083% IN NEBU: 2.5000 mg | INHALATION_SOLUTION | Freq: Four times a day (QID) | RESPIRATORY_TRACT | Status: DC | PRN

## 2023-06-17 MED ORDER — ONDANSETRON HCL 4 MG/2ML IJ SOLN: 4.0000 mg | Freq: Once | INTRAMUSCULAR | Status: DC | PRN

## 2023-06-17 MED ORDER — ONDANSETRON HCL 4 MG/2ML IJ SOLN
4.0000 mg | Freq: Four times a day (QID) | INTRAMUSCULAR | Status: DC | PRN
  Administered 2023-06-17 – 2023-06-22 (×5): 4 mg via INTRAVENOUS
  Filled 2023-06-17 (×4): qty 2

## 2023-06-17 MED ORDER — ONDANSETRON HCL 4 MG PO TABS: 4.0000 mg | ORAL_TABLET | Freq: Four times a day (QID) | ORAL | Status: DC | PRN

## 2023-06-17 MED ORDER — FENTANYL CITRATE PF 50 MCG/ML IJ SOSY
25.0000 ug | PREFILLED_SYRINGE | INTRAMUSCULAR | Status: DC | PRN
  Administered 2023-06-17 (×3): 50 ug via INTRAVENOUS

## 2023-06-17 MED ORDER — 0.9 % SODIUM CHLORIDE (POUR BTL) OPTIME
TOPICAL | Status: DC | PRN
  Administered 2023-06-17: 4000 mL

## 2023-06-17 MED ORDER — FAMOTIDINE IN NACL 20-0.9 MG/50ML-% IV SOLN
20.0000 mg | Freq: Two times a day (BID) | INTRAVENOUS | Status: DC | PRN
  Administered 2023-06-17: 20 mg via INTRAVENOUS

## 2023-06-17 MED ORDER — ALBUTEROL SULFATE HFA 108 (90 BASE) MCG/ACT IN AERS: 2.0000 | INHALATION_SPRAY | Freq: Four times a day (QID) | RESPIRATORY_TRACT | Status: DC | PRN

## 2023-06-17 MED ORDER — SUCCINYLCHOLINE CHLORIDE 200 MG/10ML IV SOSY
PREFILLED_SYRINGE | INTRAVENOUS | Status: AC
  Filled 2023-06-17: qty 10

## 2023-06-17 MED ORDER — KETAMINE HCL 10 MG/ML IJ SOLN
INTRAMUSCULAR | Status: DC | PRN
  Administered 2023-06-17: 30 mg via INTRAVENOUS

## 2023-06-17 MED ORDER — HYDROMORPHONE HCL 1 MG/ML IJ SOLN
0.5000 mg | INTRAMUSCULAR | Status: DC | PRN
  Administered 2023-06-17: 0.5 mg via INTRAVENOUS
  Administered 2023-06-17: 1 mg via INTRAVENOUS
  Filled 2023-06-17: qty 1

## 2023-06-17 MED ORDER — PANTOPRAZOLE SODIUM 40 MG PO TBEC
80.0000 mg | DELAYED_RELEASE_TABLET | Freq: Every day | ORAL | Status: DC
  Administered 2023-06-17: 80 mg via ORAL

## 2023-06-17 MED ORDER — SACCHAROMYCES BOULARDII 250 MG PO CAPS
250.0000 mg | ORAL_CAPSULE | Freq: Two times a day (BID) | ORAL | Status: DC
  Administered 2023-06-17 – 2023-06-18 (×2): 250 mg via ORAL
  Filled 2023-06-17 (×2): qty 1

## 2023-06-17 MED ORDER — OXYCODONE HCL 5 MG PO TABS
5.0000 mg | ORAL_TABLET | ORAL | Status: DC | PRN
  Administered 2023-06-18: 5 mg via ORAL
  Administered 2023-06-19 – 2023-06-21 (×4): 10 mg via ORAL
  Filled 2023-06-17 (×5): qty 2
  Filled 2023-06-17: qty 1

## 2023-06-17 MED ORDER — ACETAMINOPHEN 10 MG/ML IV SOLN
INTRAVENOUS | Status: AC
  Filled 2023-06-17: qty 100

## 2023-06-17 MED ORDER — PHENYLEPHRINE HCL-NACL 20-0.9 MG/250ML-% IV SOLN
INTRAVENOUS | Status: DC | PRN
  Administered 2023-06-17: 50 ug/min via INTRAVENOUS

## 2023-06-17 MED ORDER — LIDOCAINE 2% (20 MG/ML) 5 ML SYRINGE
INTRAMUSCULAR | Status: DC | PRN
  Administered 2023-06-17: 80 mg via INTRAVENOUS

## 2023-06-17 MED ORDER — PHENYLEPHRINE HCL (PRESSORS) 10 MG/ML IV SOLN
INTRAVENOUS | Status: AC
  Filled 2023-06-17: qty 1

## 2023-06-17 MED ORDER — KETAMINE HCL 50 MG/5ML IJ SOSY
PREFILLED_SYRINGE | INTRAMUSCULAR | Status: AC
  Filled 2023-06-17: qty 5

## 2023-06-17 MED ORDER — ZOLPIDEM TARTRATE 5 MG PO TABS
5.0000 mg | ORAL_TABLET | Freq: Every day | ORAL | Status: DC
  Administered 2023-06-19 – 2023-06-21 (×3): 5 mg via ORAL
  Filled 2023-06-17 (×5): qty 1

## 2023-06-17 MED ORDER — SIMETHICONE 80 MG PO CHEW: 40.0000 mg | CHEWABLE_TABLET | Freq: Four times a day (QID) | ORAL | Status: DC | PRN

## 2023-06-17 MED ORDER — FENTANYL CITRATE (PF) 250 MCG/5ML IJ SOLN
INTRAMUSCULAR | Status: AC
  Filled 2023-06-17: qty 5

## 2023-06-17 MED ORDER — DEXAMETHASONE SODIUM PHOSPHATE 10 MG/ML IJ SOLN
INTRAMUSCULAR | Status: DC | PRN
  Administered 2023-06-17: 8 mg via INTRAVENOUS

## 2023-06-17 MED ORDER — ENOXAPARIN SODIUM 40 MG/0.4ML IJ SOSY
40.0000 mg | PREFILLED_SYRINGE | INTRAMUSCULAR | Status: DC
  Administered 2023-06-18 – 2023-06-22 (×5): 40 mg via SUBCUTANEOUS
  Filled 2023-06-17 (×6): qty 0.4

## 2023-06-17 MED ORDER — PROPOFOL 10 MG/ML IV BOLUS
INTRAVENOUS | Status: DC | PRN
Start: 2023-06-17 — End: 2023-06-17
  Administered 2023-06-17: 160 mg via INTRAVENOUS

## 2023-06-17 MED ORDER — SCOPOLAMINE 1 MG/3DAYS TD PT72
1.0000 | MEDICATED_PATCH | TRANSDERMAL | Status: DC
  Administered 2023-06-17: 1.5 mg via TRANSDERMAL

## 2023-06-17 MED ORDER — MIDAZOLAM HCL 2 MG/2ML IJ SOLN
INTRAMUSCULAR | Status: AC
  Filled 2023-06-17: qty 2

## 2023-06-17 MED ORDER — FENTANYL CITRATE PF 50 MCG/ML IJ SOSY
PREFILLED_SYRINGE | INTRAMUSCULAR | Status: AC
  Filled 2023-06-17: qty 1

## 2023-06-17 MED ORDER — KCL IN DEXTROSE-NACL 20-5-0.45 MEQ/L-%-% IV SOLN
INTRAVENOUS | Status: DC
  Filled 2023-06-17 (×3): qty 1000

## 2023-06-17 MED ORDER — LIDOCAINE 5 % EX PTCH
1.0000 | MEDICATED_PATCH | CUTANEOUS | Status: DC
  Administered 2023-06-18 – 2023-06-19 (×2): 1 via TRANSDERMAL
  Filled 2023-06-17 (×5): qty 1

## 2023-06-17 MED ORDER — METHOCARBAMOL 1000 MG/10ML IJ SOLN
500.0000 mg | Freq: Three times a day (TID) | INTRAVENOUS | Status: DC
  Administered 2023-06-17 (×2): 500 mg via INTRAVENOUS
  Filled 2023-06-17: qty 5
  Filled 2023-06-17 (×2): qty 500
  Filled 2023-06-17 (×2): qty 5

## 2023-06-17 MED ORDER — DEXAMETHASONE SODIUM PHOSPHATE 10 MG/ML IJ SOLN
INTRAMUSCULAR | Status: AC
  Filled 2023-06-17: qty 1

## 2023-06-17 MED ORDER — SCOPOLAMINE 1 MG/3DAYS TD PT72
MEDICATED_PATCH | TRANSDERMAL | Status: AC
  Filled 2023-06-17: qty 1

## 2023-06-17 MED ORDER — PIPERACILLIN-TAZOBACTAM 3.375 G IVPB
3.3750 g | Freq: Three times a day (TID) | INTRAVENOUS | Status: DC
  Administered 2023-06-17 – 2023-06-21 (×13): 3.375 g via INTRAVENOUS
  Filled 2023-06-17 (×12): qty 50

## 2023-06-17 MED ORDER — SUGAMMADEX SODIUM 200 MG/2ML IV SOLN
INTRAVENOUS | Status: DC | PRN
  Administered 2023-06-17: 200 mg via INTRAVENOUS

## 2023-06-17 MED ORDER — PIPERACILLIN-TAZOBACTAM 3.375 G IVPB
3.3750 g | Freq: Once | INTRAVENOUS | Status: AC
  Administered 2023-06-17: 3.375 g via INTRAVENOUS
  Filled 2023-06-17: qty 50

## 2023-06-17 MED ORDER — ONDANSETRON HCL 4 MG/2ML IJ SOLN
INTRAMUSCULAR | Status: DC | PRN
  Administered 2023-06-17: 4 mg via INTRAVENOUS

## 2023-06-17 MED ORDER — PROPOFOL 10 MG/ML IV BOLUS
INTRAVENOUS | Status: AC
  Filled 2023-06-17: qty 20

## 2023-06-17 MED ORDER — FENTANYL CITRATE (PF) 250 MCG/5ML IJ SOLN
INTRAMUSCULAR | Status: DC | PRN
  Administered 2023-06-17: 100 ug via INTRAVENOUS

## 2023-06-17 MED ORDER — ROCURONIUM BROMIDE 10 MG/ML (PF) SYRINGE
PREFILLED_SYRINGE | INTRAVENOUS | Status: DC | PRN
  Administered 2023-06-17: 50 mg via INTRAVENOUS

## 2023-06-17 MED ORDER — HYDROMORPHONE HCL 1 MG/ML IJ SOLN
0.5000 mg | Freq: Once | INTRAMUSCULAR | Status: AC
  Administered 2023-06-17: 0.5 mg via INTRAVENOUS
  Filled 2023-06-17: qty 1

## 2023-06-17 MED ORDER — SUCCINYLCHOLINE CHLORIDE 200 MG/10ML IV SOSY
PREFILLED_SYRINGE | INTRAVENOUS | Status: DC | PRN
  Administered 2023-06-17: 120 mg via INTRAVENOUS

## 2023-06-17 MED ORDER — ALUM & MAG HYDROXIDE-SIMETH 200-200-20 MG/5ML PO SUSP
30.0000 mL | Freq: Four times a day (QID) | ORAL | Status: DC | PRN
  Administered 2023-06-17: 30 mL via ORAL
  Filled 2023-06-17: qty 30

## 2023-06-17 MED ORDER — MIDAZOLAM HCL 5 MG/5ML IJ SOLN
INTRAMUSCULAR | Status: DC | PRN
  Administered 2023-06-17: 2 mg via INTRAVENOUS

## 2023-06-17 MED ORDER — ROCURONIUM BROMIDE 10 MG/ML (PF) SYRINGE
PREFILLED_SYRINGE | INTRAVENOUS | Status: AC
  Filled 2023-06-17: qty 10

## 2023-06-17 MED ORDER — DIPHENHYDRAMINE HCL 12.5 MG/5ML PO ELIX: 12.5000 mg | ORAL_SOLUTION | Freq: Four times a day (QID) | ORAL | Status: DC | PRN

## 2023-06-17 MED ORDER — ACETAMINOPHEN 10 MG/ML IV SOLN
INTRAVENOUS | Status: DC | PRN
  Administered 2023-06-17: 1000 mg via INTRAVENOUS

## 2023-06-17 MED ORDER — ACETAMINOPHEN 500 MG PO TABS
1000.0000 mg | ORAL_TABLET | Freq: Four times a day (QID) | ORAL | Status: DC
  Administered 2023-06-17 – 2023-06-22 (×17): 1000 mg via ORAL
  Filled 2023-06-17 (×19): qty 2

## 2023-06-17 MED ORDER — FAMOTIDINE 20 MG PO TABS: 40.0000 mg | ORAL_TABLET | Freq: Every day | ORAL | Status: DC | PRN

## 2023-06-17 MED ORDER — ESOMEPRAZOLE MAGNESIUM 20 MG PO CPDR
40.0000 mg | DELAYED_RELEASE_CAPSULE | Freq: Every day | ORAL | Status: DC
  Administered 2023-06-17 – 2023-06-22 (×6): 40 mg via ORAL
  Filled 2023-06-17 (×6): qty 2

## 2023-06-17 MED ORDER — FENTANYL CITRATE PF 50 MCG/ML IJ SOSY
PREFILLED_SYRINGE | INTRAMUSCULAR | Status: AC
  Filled 2023-06-17: qty 2

## 2023-06-17 MED ORDER — BUPROPION HCL ER (XL) 150 MG PO TB24
300.0000 mg | ORAL_TABLET | Freq: Every day | ORAL | Status: DC
  Administered 2023-06-17 – 2023-06-22 (×6): 300 mg via ORAL
  Filled 2023-06-17 (×2): qty 2
  Filled 2023-06-17 (×2): qty 1
  Filled 2023-06-17: qty 2

## 2023-06-17 MED ORDER — ONDANSETRON HCL 4 MG/2ML IJ SOLN
INTRAMUSCULAR | Status: AC
  Filled 2023-06-17: qty 2

## 2023-06-17 SURGICAL SUPPLY — 50 items
APL PRP STRL LF DISP 70% ISPRP (MISCELLANEOUS) ×1
BAG COUNTER SPONGE SURGICOUNT (BAG) IMPLANT
BAG SPNG CNTER NS LX DISP (BAG)
BLADE EXTENDED COATED 6.5IN (ELECTRODE) IMPLANT
CELLS DAT CNTRL 66122 CELL SVR (MISCELLANEOUS) IMPLANT
CHLORAPREP W/TINT 26 (MISCELLANEOUS) ×1 IMPLANT
DRAIN CHANNEL 19F RND (DRAIN) IMPLANT
DRAPE LAPAROSCOPIC ABDOMINAL (DRAPES) ×1 IMPLANT
DRAPE SHEET LG 3/4 BI-LAMINATE (DRAPES) IMPLANT
DRSG OPSITE POSTOP 4X10 (GAUZE/BANDAGES/DRESSINGS) IMPLANT
DRSG OPSITE POSTOP 4X6 (GAUZE/BANDAGES/DRESSINGS) IMPLANT
DRSG OPSITE POSTOP 4X8 (GAUZE/BANDAGES/DRESSINGS) IMPLANT
ELECT REM PT RETURN 15FT ADLT (MISCELLANEOUS) ×1 IMPLANT
EVACUATOR SILICONE 100CC (DRAIN) IMPLANT
GAUZE SPONGE 4X4 12PLY STRL (GAUZE/BANDAGES/DRESSINGS) IMPLANT
GLOVE BIO SURGEON STRL SZ 6.5 (GLOVE) ×2 IMPLANT
GLOVE BIO SURGEON STRL SZ8 (GLOVE) IMPLANT
GLOVE BIOGEL PI IND STRL 7.5 (GLOVE) IMPLANT
GLOVE BIOGEL PI IND STRL 8 (GLOVE) IMPLANT
GLOVE INDICATOR 6.5 STRL GRN (GLOVE) ×3 IMPLANT
GOWN STRL REUS W/ TWL XL LVL3 (GOWN DISPOSABLE) ×3 IMPLANT
GOWN STRL REUS W/TWL 2XL LVL3 (GOWN DISPOSABLE) IMPLANT
GOWN STRL REUS W/TWL XL LVL3 (GOWN DISPOSABLE) ×3 IMPLANT
HANDLE SUCTION POOLE (INSTRUMENTS) IMPLANT
KIT TURNOVER KIT A (KITS) IMPLANT
LEGGING LITHOTOMY PAIR STRL (DRAPES) IMPLANT
LIGASURE IMPACT 36 18CM CVD LR (INSTRUMENTS) IMPLANT
PACK COLON (CUSTOM PROCEDURE TRAY) ×1 IMPLANT
PENCIL SMOKE EVACUATOR (MISCELLANEOUS) IMPLANT
RETRACTOR WND ALEXIS 18 MED (MISCELLANEOUS) IMPLANT
RETRACTOR WND ALEXIS 25 LRG (MISCELLANEOUS) IMPLANT
RETRACTOR WOUND ALXS 25CM LRG (MISCELLANEOUS) ×1 IMPLANT
RTRCTR WOUND ALEXIS 18CM MED (MISCELLANEOUS)
RTRCTR WOUND ALEXIS 25CM LRG (MISCELLANEOUS) ×1
STAPLER VISISTAT 35W (STAPLE) ×1 IMPLANT
SUCTION POOLE HANDLE (INSTRUMENTS)
SUT ETHILON 3 0 PS 1 (SUTURE) IMPLANT
SUT NOVA 1 T20/GS 25DT (SUTURE) ×2 IMPLANT
SUT PDS AB 1 CT1 27 (SUTURE) IMPLANT
SUT SILK 2 0 (SUTURE) ×1
SUT SILK 2 0 SH CR/8 (SUTURE) ×1 IMPLANT
SUT SILK 2-0 18XBRD TIE 12 (SUTURE) ×1 IMPLANT
SUT SILK 3 0 (SUTURE) ×1
SUT SILK 3 0 SH CR/8 (SUTURE) ×1 IMPLANT
SUT SILK 3-0 18XBRD TIE 12 (SUTURE) ×1 IMPLANT
SUT VIC AB 2-0 SH 18 (SUTURE) ×1 IMPLANT
TOWEL OR 17X26 10 PK STRL BLUE (TOWEL DISPOSABLE) IMPLANT
TOWEL OR NON WOVEN STRL DISP B (DISPOSABLE) ×1 IMPLANT
TRAY FOLEY W/BAG SLVR 14FR LF (SET/KITS/TRAYS/PACK) IMPLANT
TUBING CONNECTING 10 (TUBING) ×2 IMPLANT

## 2023-06-17 NOTE — H&P (Signed)
CC: abd pain  Requesting provider: ED  HPI: Melissa Cohen is an 57 y.o. female who is here for worsening abd pain after colonoscopy this afternoon for UC surveillance.  Random biopsies taken.  Some TI inflammation and pancolitis seen.  She is on Rinvoq for her UC.  Past Medical History:  Diagnosis Date   Allergy    Anemia    Anxiety    Arthritis    Osteo arthritis - bil knees   Asthma    as a young adult related to allergy flare   Atypical nevi 08/25/2018   1. LEFT MID BACK (MILD) - NO TX, 2. RIGHT CHEST (SEVERE) - W/S   Atypical nevi 09/14/2018   MID BACK (SEVERE) - W/S   Atypical nevi 11/13/2018   LEFT POST. NECK INCIDENTRAL MOLE (MILD)   Atypical nevi 11/27/2018   RIGHT CHEST + MARGIN   Atypical nevus 01/02/2018   LEFT UPPER BACK (SEVERE) - W/S   Atypical nevus 01/04/2019   LEFT STERNUM ( MODERATE)   Atypical nevus 01/04/2019   LEFT POST NECK INF. (SEVERE)   Atypical nevus 01/04/2019   RECURRENT MID BACK - [facility] DR. PAVLIS   Back pain    Basal cell carcinoma 06/06/2008   LEFT UPPER SHOULDER PROX. @ [facility]   Blood transfusion without reported diagnosis    from [facility] several   Chest pain    Depression    Fatty liver    Gallbladder problem    Gastric ulcer    GERD (gastroesophageal reflux disease)    Heart murmur    Hyperlipidemia    Hypertension    Iron deficiency anemia    Joint pain    Knee pain    Lower extremity edema    Melanoma (HCC)    right cleavage, posterior left shoulder   MM (malignant melanoma of skin) (HCC) 09/14/2018   LEFT POST. 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 Was then started back on Remicade and is doing well since.   Vitamin D deficiency     Past Surgical History:  Procedure Laterality Date   APPENDECTOMY     BREAST ENHANCEMENT SURGERY  2004   CHOLECYSTECTOMY  2011   COLONOSCOPY  2019   right eye lasix     02/17/12   TONSILLECTOMY AND ADENOIDECTOMY     TUBAL LIGATION     UPPER GASTROINTESTINAL ENDOSCOPY     Uterine ablation     WISDOM TOOTH EXTRACTION      Family History  Problem Relation Age of Onset   Prostate cancer Father    Hyperlipidemia Father    Barrett's esophagus Father    Cancer Father        prostate   Colon cancer Father 84       appendical adenocarcinoma 2011   Hypertension Father    Diabetes Sister    Diabetes Other        Grandparents  Heart attack Maternal Grandfather    Heart disease Maternal Grandfather    Colitis Maternal Grandfather    Ulcerative colitis Mother    Hypertension Mother    Hyperlipidemia Mother    Depression Mother    Colon polyps Mother    Ulcerative colitis Maternal Grandmother    Crohn's disease Brother    Esophageal cancer Neg Hx    Rectal cancer Neg Hx    Stomach cancer Neg Hx    Allergic rhinitis Neg Hx    Angioedema Neg Hx    Asthma Neg Hx    Atopy Neg Hx    Eczema Neg Hx    Immunodeficiency Neg Hx     Social:  reports that she quit smoking about 17 years ago. Her smoking use included cigarettes. She started smoking about 42 years ago. She has a 12.5 pack-year smoking history. She has never used smokeless tobacco. She reports current alcohol use of about 3.0 standard drinks of alcohol per week. She reports that she does not use drugs.  Allergies:  Allergies  Allergen Reactions   Tagamet [Cimetidine] Anaphylaxis    REACTION: anaphalactic shock   Effexor [Venlafaxine]     Swelling    Mobic [Meloxicam] Swelling   Ultram [Tramadol] Swelling    Medications: I have reviewed the patient's current medications.  Results for  orders placed or performed during the hospital encounter of 06/16/23 (from the past 48 hour(s))  CBC with Differential     Status: None   Collection Time: 06/16/23 10:40 PM  Result Value Ref Range   WBC 7.5 4.0 - 10.5 K/uL   RBC 3.97 3.87 - 5.11 MIL/uL   Hemoglobin 12.6 12.0 - 15.0 g/dL   HCT 66.4 40.3 - 47.4 %   MCV 94.0 80.0 - 100.0 fL   MCH 31.7 26.0 - 34.0 pg   MCHC 33.8 30.0 - 36.0 g/dL   RDW 25.9 56.3 - 87.5 %   Platelets 258 150 - 400 K/uL   nRBC 0.0 0.0 - 0.2 %   Neutrophils Relative % 84 %   Neutro Abs 6.3 1.7 - 7.7 K/uL   Lymphocytes Relative 10 %   Lymphs Abs 0.8 0.7 - 4.0 K/uL   Monocytes Relative 6 %   Monocytes Absolute 0.5 0.1 - 1.0 K/uL   Eosinophils Relative 0 %   Eosinophils Absolute 0.0 0.0 - 0.5 K/uL   Basophils Relative 0 %   Basophils Absolute 0.0 0.0 - 0.1 K/uL   Immature Granulocytes 0 %   Abs Immature Granulocytes 0.02 0.00 - 0.07 K/uL    Comment: Performed at [facility], [address] [place], [place] [address]  Comprehensive metabolic panel     Status: Abnormal   Collection Time: 06/16/23 10:40 PM  Result Value Ref Range   Sodium 137 135 - 145 mmol/L   Potassium 3.3 (L) 3.5 - 5.1 mmol/L   Chloride 106 98 - 111 mmol/L   CO2 22 22 - 32 mmol/L   Glucose, Bld 123 (H) 70 - 99 mg/dL    Comment: Glucose reference range applies only to samples taken after fasting for at least 8 hours.   BUN 11 6 - 20 mg/dL   Creatinine, Ser 9.51 0.44 - 1.00 mg/dL   Calcium 8.7 (L) 8.9 - 10.3 mg/dL   Total Protein 6.9 6.5 - 8.1 g/dL   Albumin 4.3 3.5 - 5.0 g/dL   AST 23 15 - 41 U/L   ALT 21 0 - 44 U/L  Alkaline Phosphatase 47 38 - 126 U/L   Total Bilirubin 0.9 0.3 - 1.2 mg/dL   GFR, Estimated >13 >08 mL/min    Comment: (NOTE) Calculated using the CKD-EPI Creatinine Equation (2021)    Anion gap 9 5 - 15    Comment: Performed at [facility], [address], [place], [place] [address]  Lipase, blood     Status: None    Collection Time: 06/16/23 10:40 PM  Result Value Ref Range   Lipase 44 11 - 51 U/L    Comment: Performed at [facility], [address], [place], [place] [address]  Type and screen     Status: None   Collection Time: 06/16/23 10:40 PM  Result Value Ref Range   ABO/RH(D) B NEG    Antibody Screen NEG    Sample Expiration      06/19/2023,2359 Performed at [facility], [address], [place], [place] [address]     CT ANGIO GI BLEED  Result Date: 06/17/2023 CLINICAL DATA:  Colonoscopy today. Abdominal pain. Biopsies were taken during the colonoscopy. Passing a few clots. EXAM: CTA ABDOMEN AND PELVIS WITHOUT AND WITH CONTRAST TECHNIQUE: Multidetector CT imaging of the abdomen and pelvis was performed using the standard protocol during bolus administration of intravenous contrast. Multiplanar reconstructed images and MIPs were obtained and reviewed to evaluate the vascular anatomy. RADIATION DOSE REDUCTION: This exam was performed according to the departmental dose-optimization program which includes automated exposure control, adjustment of the mA and/or kV according to patient size and/or use of iterative reconstruction technique. CONTRAST:  OMNIPAQUE IOHEXOL 350 MG/ML SOLN COMPARISON:  MRI abdomen 04/15/2022 FINDINGS: VASCULAR Aorta: Normal caliber aorta without aneurysm, dissection, vasculitis or significant stenosis. Celiac: Patent without evidence of aneurysm, dissection, vasculitis or significant stenosis. SMA: Patent without evidence of aneurysm, dissection, vasculitis or significant stenosis. Renals: Both renal arteries are patent without evidence of aneurysm, dissection, vasculitis, fibromuscular dysplasia or significant stenosis. IMA: Patent without evidence of aneurysm, dissection, vasculitis or significant stenosis. Inflow: Patent without evidence of aneurysm, dissection, vasculitis or significant stenosis. Proximal Outflow: Bilateral common  femoral and visualized portions of the superficial and profunda femoral arteries are patent without evidence of aneurysm, dissection, vasculitis or significant stenosis. Veins: No obvious venous abnormality within the limitations of this arterial phase study. Review of the MIP images confirms the above findings. NON-VASCULAR Lower chest: Partially visualized pneumomediastinum likely from air dissecting superiorly from pneumoperitoneum. Hepatobiliary: Cholecystectomy. No biliary dilation. No focal hepatic lesion. Pancreas: Unremarkable. Spleen: Unremarkable. Adrenals/Urinary Tract: Unremarkable adrenal glands. No urinary calculi or hydronephrosis. Unremarkable bladder. Stomach/Bowel: Large volume free intraperitoneal and retroperitoneal air. The distribution of free air is greatest in the right hemiabdomen suggesting the site of perforation may be near the hepatic flexure however no definite wall discontinuity is observed. No evidence of active bleeding. There is mild stranding along the colon at the hepatic flexure. Normal caliber large and small bowel. Stomach is within normal limits. Lymphatic: Right ileocolic lymph nodes are mildly enlarged measuring up to 11 mm (series 6/image 123). Reproductive: No acute abnormality. Other: Small amount of low-density free fluid in the pelvis. Left paramidline ventral abdominal wall hernia containing fat and gas. Musculoskeletal: No acute fracture. IMPRESSION: 1. Large volume free intraperitoneal and retroperitoneal air. The distribution of free air is greatest in the right hemiabdomen suggesting the site of perforation may be near the hepatic flexure however no definite wall discontinuity is observed. 2. No evidence of active bleeding. 3. Partially  visualized pneumomediastinum likely from air dissecting superiorly from pneumoperitoneum. 4. Indeterminate mildly enlarged right ileocolic lymph nodes. Critical Value/emergent results were called by telephone at the time of  interpretation on 06/16/2023 at 11:59 pm to provider [facility] , who verbally acknowledged these results. Electronically Signed   By: Minerva Fester M.D.   On: 06/17/2023 00:10    ROS - all of the below systems have been reviewed with the patient and positives are indicated with bold text General: chills, fever or night sweats Eyes: blurry vision or double vision ENT: epistaxis or sore throat Hematologic/Lymphatic: bleeding problems, blood clots or swollen lymph nodes Endocrine: temperature intolerance or unexpected weight changes Breast: new or changing breast lumps or nipple discharge Resp: cough, shortness of breath, or wheezing CV: chest pain or dyspnea on exertion GI: as per HPI GU: dysuria, trouble voiding, or hematuria Neuro: TIA or stroke symptoms    PE Blood pressure 130/77, pulse (!) 104, temperature 98.6 F (37 C), resp. rate 19, last menstrual period 09/02/2013, SpO2 97%. Constitutional: NAD; conversant; no deformities Eyes: Moist conjunctiva; no lid lag; anicteric; PERRL Neck: Trachea midline; no thyromegaly Lungs: Normal respiratory effort CV: RRR GI: Abd distended, TTP MSK: Normal range of motion of extremities; no clubbing/cyanosis Psychiatric: Appropriate affect; alert and oriented x3  Results for orders placed or performed during the hospital encounter of 06/16/23 (from the past 48 hour(s))  CBC with Differential     Status: None   Collection Time: 06/16/23 10:40 PM  Result Value Ref Range   WBC 7.5 4.0 - 10.5 K/uL   RBC 3.97 3.87 - 5.11 MIL/uL   Hemoglobin 12.6 12.0 - 15.0 g/dL   HCT 74.2 59.5 - 63.8 %   MCV 94.0 80.0 - 100.0 fL   MCH 31.7 26.0 - 34.0 pg   MCHC 33.8 30.0 - 36.0 g/dL   RDW 75.6 43.3 - 29.5 %   Platelets 258 150 - 400 K/uL   nRBC 0.0 0.0 - 0.2 %   Neutrophils Relative % 84 %   Neutro Abs 6.3 1.7 - 7.7 K/uL   Lymphocytes Relative 10 %   Lymphs Abs 0.8 0.7 - 4.0 K/uL   Monocytes Relative 6 %   Monocytes Absolute 0.5 0.1 - 1.0 K/uL    Eosinophils Relative 0 %   Eosinophils Absolute 0.0 0.0 - 0.5 K/uL   Basophils Relative 0 %   Basophils Absolute 0.0 0.0 - 0.1 K/uL   Immature Granulocytes 0 %   Abs Immature Granulocytes 0.02 0.00 - 0.07 K/uL    Comment: Performed at [facility], [address] [place], [place] [address]  Comprehensive metabolic panel     Status: Abnormal   Collection Time: 06/16/23 10:40 PM  Result Value Ref Range   Sodium 137 135 - 145 mmol/L   Potassium 3.3 (L) 3.5 - 5.1 mmol/L   Chloride 106 98 - 111 mmol/L   CO2 22 22 - 32 mmol/L   Glucose, Bld 123 (H) 70 - 99 mg/dL    Comment: Glucose reference range applies only to samples taken after fasting for at least 8 hours.   BUN 11 6 - 20 mg/dL   Creatinine, Ser 6.60 0.44 - 1.00 mg/dL   Calcium 8.7 (L) 8.9 - 10.3 mg/dL   Total Protein 6.9 6.5 - 8.1 g/dL   Albumin 4.3 3.5 - 5.0 g/dL   AST 23 15 - 41 U/L   ALT 21 0 - 44 U/L   Alkaline Phosphatase 47 38 -  126 U/L   Total Bilirubin 0.9 0.3 - 1.2 mg/dL   GFR, Estimated >16 >10 mL/min    Comment: (NOTE) Calculated using the CKD-EPI Creatinine Equation (2021)    Anion gap 9 5 - 15    Comment: Performed at [facility], [address] [place], [place] [address]  Lipase, blood     Status: None   Collection Time: 06/16/23 10:40 PM  Result Value Ref Range   Lipase 44 11 - 51 U/L    Comment: Performed at [facility], [address] [place], [place] [address]  Type and screen     Status: None   Collection Time: 06/16/23 10:40 PM  Result Value Ref Range   ABO/RH(D) B NEG    Antibody Screen NEG    Sample Expiration      06/19/2023,2359 Performed at [facility], [address], [place], [place] [address]     CT ANGIO GI BLEED  Result Date: 06/17/2023 CLINICAL DATA:  Colonoscopy today. Abdominal pain. Biopsies were taken during the colonoscopy. Passing a few clots. EXAM: CTA ABDOMEN AND PELVIS WITHOUT AND WITH CONTRAST  TECHNIQUE: Multidetector CT imaging of the abdomen and pelvis was performed using the standard protocol during bolus administration of intravenous contrast. Multiplanar reconstructed images and MIPs were obtained and reviewed to evaluate the vascular anatomy. RADIATION DOSE REDUCTION: This exam was performed according to the departmental dose-optimization program which includes automated exposure control, adjustment of the mA and/or kV according to patient size and/or use of iterative reconstruction technique. CONTRAST:  OMNIPAQUE IOHEXOL 350 MG/ML SOLN COMPARISON:  MRI abdomen 04/15/2022 FINDINGS: VASCULAR Aorta: Normal caliber aorta without aneurysm, dissection, vasculitis or significant stenosis. Celiac: Patent without evidence of aneurysm, dissection, vasculitis or significant stenosis. SMA: Patent without evidence of aneurysm, dissection, vasculitis or significant stenosis. Renals: Both renal arteries are patent without evidence of aneurysm, dissection, vasculitis, fibromuscular dysplasia or significant stenosis. IMA: Patent without evidence of aneurysm, dissection, vasculitis or significant stenosis. Inflow: Patent without evidence of aneurysm, dissection, vasculitis or significant stenosis. Proximal Outflow: Bilateral common femoral and visualized portions of the superficial and profunda femoral arteries are patent without evidence of aneurysm, dissection, vasculitis or significant stenosis. Veins: No obvious venous abnormality within the limitations of this arterial phase study. Review of the MIP images confirms the above findings. NON-VASCULAR Lower chest: Partially visualized pneumomediastinum likely from air dissecting superiorly from pneumoperitoneum. Hepatobiliary: Cholecystectomy. No biliary dilation. No focal hepatic lesion. Pancreas: Unremarkable. Spleen: Unremarkable. Adrenals/Urinary Tract: Unremarkable adrenal glands. No urinary calculi or hydronephrosis. Unremarkable bladder. Stomach/Bowel:  Large volume free intraperitoneal and retroperitoneal air. The distribution of free air is greatest in the right hemiabdomen suggesting the site of perforation may be near the hepatic flexure however no definite wall discontinuity is observed. No evidence of active bleeding. There is mild stranding along the colon at the hepatic flexure. Normal caliber large and small bowel. Stomach is within normal limits. Lymphatic: Right ileocolic lymph nodes are mildly enlarged measuring up to 11 mm (series 6/image 123). Reproductive: No acute abnormality. Other: Small amount of low-density free fluid in the pelvis. Left paramidline ventral abdominal wall hernia containing fat and gas. Musculoskeletal: No acute fracture. IMPRESSION: 1. Large volume free intraperitoneal and retroperitoneal air. The distribution of free air is greatest in the right hemiabdomen suggesting the site of perforation may be near the hepatic flexure however no definite wall discontinuity is observed. 2. No evidence of active bleeding. 3. Partially visualized pneumomediastinum likely from air  dissecting superiorly from pneumoperitoneum. 4. Indeterminate mildly enlarged right ileocolic lymph nodes. Critical Value/emergent results were called by telephone at the time of interpretation on 06/16/2023 at 11:59 pm to provider [facility] , who verbally acknowledged these results. Electronically Signed   By: Minerva Fester M.D.   On: 06/17/2023 00:10     A/P: Melissa Cohen is an 57 y.o. female with colon perforation after colonoscopy.  I have recommended ex lap and repair or removal of perforation.  We discussed the indications for ostomy.  We discussed the risk of anastomosis after perforation in the setting of IBD and immunosuppression.  All questions were answered.   The surgery and anatomy were described to the patient as well as the risks of surgery and the possible complications.  These include: Bleeding, deep abdominal infections and possible  wound complications such as hernia and infection, damage to adjacent structures, leak of surgical connections, which can lead to other surgeries and possibly an ostomy, possible need for other procedures, such as abscess drains in radiology, possible prolonged hospital stay, possible diarrhea from removal of part of the colon, possible constipation from narcotics, prolonged fatigue/weakness or appetite loss, possible early recurrence of of disease, possible complications of their medical problems such as heart disease or arrhythmias or lung problems, death (less than 1%). I believe the patient understands and wishes to proceed with the surgery.  Vanita Panda, MD  Colorectal and General Surgery [facility]  Total time of evaluation, examination, counseling and implementing medical decisions was 82 mins.  Medical decision making was high medical decision making.

## 2023-06-17 NOTE — Telephone Encounter (Signed)
Inbound call from patients significant other called requesting to speak to nurse. Stated that yesterday she had a colonoscopy and they tore her colon and she was sent cross the street and had to have surgery. Please advise.

## 2023-06-17 NOTE — Progress Notes (Signed)
Day of Surgery  Subjective: Doing as expected post op  Objective: Vital signs in last 24 hours: Temp:  [97.5 F (36.4 C)-99.1 F (37.3 C)] 97.5 F (36.4 C) (07/19 0816) Pulse Rate:  [68-105] 79 (07/19 0816) Resp:  [0-22] 17 (07/19 0816) BP: (101-149)/(60-103) 116/78 (07/19 0816) SpO2:  [85 %-100 %] 99 % (07/19 0816) Weight:  [71.7 kg] 71.7 kg (07/18 1458)   Intake/Output from previous day: 07/18 0701 - 07/19 0700 In: 1500 [I.V.:1400; IV Piggyback:100] Out: 205 [Urine:130; Blood:75] Intake/Output this shift: No intake/output data recorded.   General appearance: alert and cooperative GI: soft, appropriately tender  Incision: no significant drainage  Lab Results:  Recent Labs    06/16/23 2240  WBC 7.5  HGB 12.6  HCT 37.3  PLT 258   BMET Recent Labs    06/16/23 2240  NA 137  K 3.3*  CL 106  CO2 22  GLUCOSE 123*  BUN 11  CREATININE 0.85  CALCIUM 8.7*   PT/INR No results for input(s): "LABPROT", "INR" in the last 72 hours. ABG No results for input(s): "PHART", "HCO3" in the last 72 hours.  Invalid input(s): "PCO2", "PO2"  MEDS, Scheduled  acetaminophen  1,000 mg Oral Q6H   buPROPion  300 mg Oral Daily   [START ON 06/18/2023] enoxaparin (LOVENOX) injection  40 mg Subcutaneous Q24H   feeding supplement  237 mL Oral BID BM   gabapentin  300 mg Oral TID   pantoprazole  80 mg Oral Q1200   saccharomyces boulardii  250 mg Oral BID   zolpidem  5 mg Oral QHS    Studies/Results: CT ANGIO GI BLEED  Result Date: 06/17/2023 CLINICAL DATA:  Colonoscopy today. Abdominal pain. Biopsies were taken during the colonoscopy. Passing a few clots. EXAM: CTA ABDOMEN AND PELVIS WITHOUT AND WITH CONTRAST TECHNIQUE: Multidetector CT imaging of the abdomen and pelvis was performed using the standard protocol during bolus administration of intravenous contrast. Multiplanar reconstructed images and MIPs were obtained and reviewed to evaluate the vascular anatomy. RADIATION DOSE  REDUCTION: This exam was performed according to the departmental dose-optimization program which includes automated exposure control, adjustment of the mA and/or kV according to patient size and/or use of iterative reconstruction technique. CONTRAST:  OMNIPAQUE IOHEXOL 350 MG/ML SOLN COMPARISON:  MRI abdomen 04/15/2022 FINDINGS: VASCULAR Aorta: Normal caliber aorta without aneurysm, dissection, vasculitis or significant stenosis. Celiac: Patent without evidence of aneurysm, dissection, vasculitis or significant stenosis. SMA: Patent without evidence of aneurysm, dissection, vasculitis or significant stenosis. Renals: Both renal arteries are patent without evidence of aneurysm, dissection, vasculitis, fibromuscular dysplasia or significant stenosis. IMA: Patent without evidence of aneurysm, dissection, vasculitis or significant stenosis. Inflow: Patent without evidence of aneurysm, dissection, vasculitis or significant stenosis. Proximal Outflow: Bilateral common femoral and visualized portions of the superficial and profunda femoral arteries are patent without evidence of aneurysm, dissection, vasculitis or significant stenosis. Veins: No obvious venous abnormality within the limitations of this arterial phase study. Review of the MIP images confirms the above findings. NON-VASCULAR Lower chest: Partially visualized pneumomediastinum likely from air dissecting superiorly from pneumoperitoneum. Hepatobiliary: Cholecystectomy. No biliary dilation. No focal hepatic lesion. Pancreas: Unremarkable. Spleen: Unremarkable. Adrenals/Urinary Tract: Unremarkable adrenal glands. No urinary calculi or hydronephrosis. Unremarkable bladder. Stomach/Bowel: Large volume free intraperitoneal and retroperitoneal air. The distribution of free air is greatest in the right hemiabdomen suggesting the site of perforation may be near the hepatic flexure however no definite wall discontinuity is observed. No evidence of active  bleeding. There is mild  stranding along the colon at the hepatic flexure. Normal caliber large and small bowel. Stomach is within normal limits. Lymphatic: Right ileocolic lymph nodes are mildly enlarged measuring up to 11 mm (series 6/image 123). Reproductive: No acute abnormality. Other: Small amount of low-density free fluid in the pelvis. Left paramidline ventral abdominal wall hernia containing fat and gas. Musculoskeletal: No acute fracture. IMPRESSION: 1. Large volume free intraperitoneal and retroperitoneal air. The distribution of free air is greatest in the right hemiabdomen suggesting the site of perforation may be near the hepatic flexure however no definite wall discontinuity is observed. 2. No evidence of active bleeding. 3. Partially visualized pneumomediastinum likely from air dissecting superiorly from pneumoperitoneum. 4. Indeterminate mildly enlarged right ileocolic lymph nodes. Critical Value/emergent results were called by telephone at the time of interpretation on 06/16/2023 at 11:59 pm to provider [facility] , who verbally acknowledged these results. Electronically Signed   By: Minerva Fester M.D.   On: 06/17/2023 00:10    Assessment: s/p Procedure(s): EXPLORATORY LAPAROTOMY, REPAIR OF COLOTOMY Patient Active Problem List   Diagnosis Date Noted   Perforation of colon as colonoscopy complication (HCC) 06/17/2023   Low back pain 05/13/2021   Adhesive capsulitis of left shoulder 04/01/2021   Metabolic syndrome 02/08/2021   At risk for impaired metabolic function 12/09/2020   Polyphagia 11/04/2020   Mood disorder, with emotional eating 11/04/2020   At risk for activity intolerance 11/04/2020   Vasovagal near syncope 03/26/2020   Frequent falls 02/13/2020   HTN (hypertension) 02/13/2020   Systolic murmur 03/26/2017   Chronic rhinitis 03/17/2016   Menopausal syndrome (hot flushes) 08/23/2014   Screening for malignant neoplasm of the cervix 02/25/2014   General medical  examination 12/07/2011   Anxiety and depression 10/08/2011   SACROILIAC JOINT DYSFUNCTION 04/12/2011   TROCHANTERIC BURSITIS, BILATERAL 04/12/2011   NEVUS, ATYPICAL 07/14/2010   Cervical radiculopathy 07/14/2010   GERD 12/31/2009   CARCINOMA, BASAL CELL 03/14/2008   Vitamin D deficiency 03/06/2008   HYPERTRIGLYCERIDEMIA 03/06/2008   Ulcerative colitis (HCC) 02/09/2007   INSOMNIA 02/09/2007    Expected post op course  Plan: Sips of clears Pain and nausea control Await return of bowel function    LOS: 0 days     .Melissa Panda, MD [facility], [place]    06/17/2023 8:36 AM

## 2023-06-17 NOTE — Anesthesia Preprocedure Evaluation (Incomplete)
Anesthesia Evaluation    Airway        Dental   Pulmonary former smoker          Cardiovascular hypertension,      Neuro/Psych    GI/Hepatic   Endo/Other    Renal/GU      Musculoskeletal   Abdominal   Peds  Hematology   Anesthesia Other Findings   Reproductive/Obstetrics                              Anesthesia Physical Anesthesia Plan Anesthesia Quick Evaluation  

## 2023-06-17 NOTE — Progress Notes (Signed)
   06/17/23 1045  TOC Brief Assessment  Insurance and Status Reviewed  Patient has primary care physician Yes  Home environment has been reviewed Home w/ s/o  Prior level of function: Independent  Prior/Current Home Services No current home services  Social Determinants of Health Reivew SDOH reviewed no interventions necessary  Readmission risk has been reviewed Yes  Transition of care needs no transition of care needs at this time

## 2023-06-17 NOTE — Anesthesia Procedure Notes (Signed)
Procedure Name: Intubation Date/Time: 06/17/2023 1:34 AM  Performed by: Ponciano Ort, CRNAPre-anesthesia Checklist: Patient identified, Emergency Drugs available, Suction available and Patient being monitored Patient Re-evaluated:Patient Re-evaluated prior to induction Oxygen Delivery Method: Circle system utilized Preoxygenation: Pre-oxygenation with 100% oxygen Induction Type: IV induction and Rapid sequence Laryngoscope Size: Mac and 3 Grade View: Grade II Tube type: Oral Tube size: 7.0 mm Number of attempts: 1 Airway Equipment and Method: Stylet and Oral airway Placement Confirmation: ETT inserted through vocal cords under direct vision, positive ETCO2 and breath sounds checked- equal and bilateral Secured at: 21 cm Tube secured with: Tape Dental Injury: Teeth and Oropharynx as per pre-operative assessment

## 2023-06-17 NOTE — Telephone Encounter (Signed)
I spoke with the pt and she tells me that she has no questions at this time. She did see and speak with Dr Russella Dar this morning.

## 2023-06-17 NOTE — Progress Notes (Signed)
Tried to ambulate patient. Patient got dizzy sitting at the side of the bed , BP dropped/ 73/50 seating at side of the bed. Patient was helped back in the bed in laying position. BP came upto 97/57.

## 2023-06-17 NOTE — Plan of Care (Signed)
  Problem: Health Behavior/Discharge Planning: Goal: Ability to manage health-related needs will improve Outcome: Progressing   Problem: Clinical Measurements: Goal: Will remain free from infection Outcome: Progressing Goal: Diagnostic test results will improve Outcome: Progressing   Problem: Activity: Goal: Risk for activity intolerance will decrease Outcome: Progressing   Problem: Nutrition: Goal: Adequate nutrition will be maintained Outcome: Progressing   Problem: Coping: Goal: Level of anxiety will decrease Outcome: Progressing

## 2023-06-17 NOTE — Anesthesia Postprocedure Evaluation (Signed)
Anesthesia Post Note  Patient: MANE CONSOLO  Procedure(s) Performed: EXPLORATORY LAPAROTOMY, REPAIR OF COLOTOMY (Abdomen)     Patient location during evaluation: PACU Anesthesia Type: General Level of consciousness: awake and alert Pain management: pain level controlled Vital Signs Assessment: post-procedure vital signs reviewed and stable Respiratory status: spontaneous breathing, nonlabored ventilation, respiratory function stable and patient connected to nasal cannula oxygen Cardiovascular status: blood pressure returned to baseline and stable Postop Assessment: no apparent nausea or vomiting Anesthetic complications: no   No notable events documented.  Last Vitals:  Vitals:   06/17/23 0345 06/17/23 0412  BP: 118/83 125/76  Pulse: 89 88  Resp: 10 16  Temp:  36.4 C  SpO2: 98% 100%    Last Pain:  Vitals:   06/17/23 0651  TempSrc:   PainSc: 8                  Collene Schlichter

## 2023-06-17 NOTE — Telephone Encounter (Signed)
Malcolm,    Please see prior notes on this stream.  I did not hear from the ED doc last night, so I checked the chart this morning.  Patient went to ED ovn and found to have transverse colon perforation repaired by Romie Levee.  Will ask our inpatient team to drop by and see her.  - H. Danis

## 2023-06-17 NOTE — Transfer of Care (Signed)
Immediate Anesthesia Transfer of Care Note  Patient: Melissa Cohen  Procedure(s) Performed: EXPLORATORY LAPAROTOMY, POSSIBLE PERFORATED COLON, POSSIBLE OSTOMY  Patient Location: PACU  Anesthesia Type:General  Level of Consciousness: awake, alert , oriented, and patient cooperative  Airway & Oxygen Therapy: Patient Spontanous Breathing and Patient connected to face mask oxygen  Post-op Assessment: Report given to RN and Post -op Vital signs reviewed and stable  Post vital signs: Reviewed and stable  Last Vitals:  Vitals Value Taken Time  BP 134/87 06/17/23 0256  Temp 36.5 C 06/17/23 0256  Pulse 89 06/17/23 0258  Resp 15 06/17/23 0258  SpO2 96 % 06/17/23 0258  Vitals shown include unfiled device data.  Last Pain:  Vitals:   06/16/23 2145  PainSc: 0-No pain         Complications: No notable events documented.

## 2023-06-17 NOTE — Progress Notes (Signed)
PT Cancellation Note  Patient Details Name: NEEVA TREW MRN: [id number] DOB: [birth year]   Cancelled Treatment:    Reason Eval/Treat Not Completed: Other (comment)  Spoke with RN and pt had surgery earlier today, recommended f/u for therapy tomorrow. Anise Salvo, PT Acute Rehab [facility] [phone]  Rayetta Humphrey 06/17/2023, 5:23 PM

## 2023-06-17 NOTE — Anesthesia Preprocedure Evaluation (Signed)
Anesthesia Evaluation  Patient identified by MRN, date of birth, ID band Patient awake    Reviewed: Allergy & Precautions, NPO status , Patient's Chart, lab work & pertinent test results  Airway Mallampati: II  TM Distance: >3 FB Neck ROM: Full    Dental  (+) Teeth Intact, Dental Advisory Given,    Pulmonary asthma , sleep apnea , former smoker   Pulmonary exam normal breath sounds clear to auscultation       Cardiovascular hypertension, Normal cardiovascular exam Rhythm:Regular Rate:Normal     Neuro/Psych Seizures -, Well Controlled,  PSYCHIATRIC DISORDERS Anxiety Depression     Neuromuscular disease    GI/Hepatic Neg liver ROS, PUD,GERD  Medicated,,UC Perforated bowel    Endo/Other  diabetes, Type 2    Renal/GU negative Renal ROS     Musculoskeletal  (+) Arthritis ,    Abdominal   Peds  Hematology negative hematology ROS (+)   Anesthesia Other Findings Day of surgery medications reviewed with the patient.  Reproductive/Obstetrics                             Anesthesia Physical Anesthesia Plan  ASA: 3 and emergent  Anesthesia Plan: General   Post-op Pain Management: Ofirmev IV (intra-op)* and Ketamine IV*   Induction: Intravenous, Rapid sequence and Cricoid pressure planned  PONV Risk Score and Plan: 4 or greater and Scopolamine patch - Pre-op, Midazolam, Dexamethasone and Ondansetron  Airway Management Planned: Oral ETT  Additional Equipment: Arterial line  Intra-op Plan:   Post-operative Plan: Possible Post-op intubation/ventilation  Informed Consent: I have reviewed the patients History and Physical, chart, labs and discussed the procedure including the risks, benefits and alternatives for the proposed anesthesia with the patient or authorized representative who has indicated his/her understanding and acceptance.     Dental advisory given  Plan Discussed with:  CRNA  Anesthesia Plan Comments:        Anesthesia Quick Evaluation

## 2023-06-17 NOTE — Progress Notes (Addendum)
Progress Note   Assessment    Colon perforation following colonoscopy with biopsies. S/P ex lap with primary repair of a 2-3 mm transverse colon defect with omental patch early this morning.  Ulcerative pancolitis with chronic colonic mucosal scarring and erythema. Ileitis noted, suspected backwash.     Recommendations   Appreciate care by Dr. Maisie Fus and [facility] team Post op mgmt per [facility] [employer] - will discuss timing to restart with Dr. Maisie Fus GI is available if needed    Chief Complaint   Abdominal pain   Vital signs in last 24 hours: Temp:  [97.5 F (36.4 C)-98.6 F (37 C)] 97.5 F (36.4 C) (07/19 0816) Pulse Rate:  [79-105] 79 (07/19 0816) Resp:  [10-21] 17 (07/19 0816) BP: (116-145)/(76-87) 116/78 (07/19 0816) SpO2:  [93 %-100 %] 99 % (07/19 0816) Last BM Date : 06/16/23  General: Alert, well-developed, uncomfortable Heart:  Regular rate and rhythm; no murmurs Chest: Clear to ascultation bilaterally Abdomen:  Soft, diffuse tenderness and nondistended. Normal bowel sounds, without guarding, and without rebound.   Extremities:  Without edema. Neurologic:  Alert and  oriented x4; grossly normal neurologically. Psych:  Alert and cooperative. Normal mood and affect.  Intake/Output from previous day: 07/18 0701 - 07/19 0700 In: 1500 [I.V.:1400; IV Piggyback:100] Out: 205 [Urine:130; Blood:75] Intake/Output this shift: No intake/output data recorded.  Lab Results: Recent Labs    06/16/23 2240  WBC 7.5  HGB 12.6  HCT 37.3  PLT 258   BMET Recent Labs    06/16/23 2240  NA 137  K 3.3*  CL 106  CO2 22  GLUCOSE 123*  BUN 11  CREATININE 0.85  CALCIUM 8.7*   LFT Recent Labs    06/16/23 2240  PROT 6.9  ALBUMIN 4.3  AST 23  ALT 21  ALKPHOS 47  BILITOT 0.9    Studies/Results: CT ANGIO GI BLEED  Result Date: 06/17/2023 CLINICAL DATA:  Colonoscopy today. Abdominal pain. Biopsies were taken during the colonoscopy. Passing a few clots. EXAM: CTA  ABDOMEN AND PELVIS WITHOUT AND WITH CONTRAST TECHNIQUE: Multidetector CT imaging of the abdomen and pelvis was performed using the standard protocol during bolus administration of intravenous contrast. Multiplanar reconstructed images and MIPs were obtained and reviewed to evaluate the vascular anatomy. RADIATION DOSE REDUCTION: This exam was performed according to the departmental dose-optimization program which includes automated exposure control, adjustment of the mA and/or kV according to patient size and/or use of iterative reconstruction technique. CONTRAST:  OMNIPAQUE IOHEXOL 350 MG/ML SOLN COMPARISON:  MRI abdomen 04/15/2022 FINDINGS: VASCULAR Aorta: Normal caliber aorta without aneurysm, dissection, vasculitis or significant stenosis. Celiac: Patent without evidence of aneurysm, dissection, vasculitis or significant stenosis. SMA: Patent without evidence of aneurysm, dissection, vasculitis or significant stenosis. Renals: Both renal arteries are patent without evidence of aneurysm, dissection, vasculitis, fibromuscular dysplasia or significant stenosis. IMA: Patent without evidence of aneurysm, dissection, vasculitis or significant stenosis. Inflow: Patent without evidence of aneurysm, dissection, vasculitis or significant stenosis. Proximal Outflow: Bilateral common femoral and visualized portions of the superficial and profunda femoral arteries are patent without evidence of aneurysm, dissection, vasculitis or significant stenosis. Veins: No obvious venous abnormality within the limitations of this arterial phase study. Review of the MIP images confirms the above findings. NON-VASCULAR Lower chest: Partially visualized pneumomediastinum likely from air dissecting superiorly from pneumoperitoneum. Hepatobiliary: Cholecystectomy. No biliary dilation. No focal hepatic lesion. Pancreas: Unremarkable. Spleen: Unremarkable. Adrenals/Urinary Tract: Unremarkable adrenal glands. No urinary calculi or  hydronephrosis. Unremarkable bladder. Stomach/Bowel:  Large volume free intraperitoneal and retroperitoneal air. The distribution of free air is greatest in the right hemiabdomen suggesting the site of perforation may be near the hepatic flexure however no definite wall discontinuity is observed. No evidence of active bleeding. There is mild stranding along the colon at the hepatic flexure. Normal caliber large and small bowel. Stomach is within normal limits. Lymphatic: Right ileocolic lymph nodes are mildly enlarged measuring up to 11 mm (series 6/image 123). Reproductive: No acute abnormality. Other: Small amount of low-density free fluid in the pelvis. Left paramidline ventral abdominal wall hernia containing fat and gas. Musculoskeletal: No acute fracture. IMPRESSION: 1. Large volume free intraperitoneal and retroperitoneal air. The distribution of free air is greatest in the right hemiabdomen suggesting the site of perforation may be near the hepatic flexure however no definite wall discontinuity is observed. 2. No evidence of active bleeding. 3. Partially visualized pneumomediastinum likely from air dissecting superiorly from pneumoperitoneum. 4. Indeterminate mildly enlarged right ileocolic lymph nodes. Critical Value/emergent results were called by telephone at the time of interpretation on 06/16/2023 at 11:59 pm to provider [facility] , who verbally acknowledged these results. Electronically Signed   By: Minerva Fester M.D.   On: 06/17/2023 00:10      LOS: 0 days   Manika Hast T. Russella Dar, MD 06/17/2023, 9:37 AM See Loretha Stapler, [facility], to contact our on call provider

## 2023-06-17 NOTE — Telephone Encounter (Signed)
  Follow up Call-     06/16/2023    2:59 PM 05/14/2021    2:36 PM  Call back number  Post procedure Call Back phone  # [phone]  Permission to leave phone message Yes Yes     Patient questions:   Patient had a perforated colon last night.  She had surgery and is [facility].

## 2023-06-17 NOTE — Op Note (Signed)
06/16/2023 - 06/17/2023  2:40 AM  PATIENT:  Melissa Cohen  57 y.o. female  Patient Care Team: Helane Rima, DO as PCP - General (Family Medicine) Meryl Dare, MD as Consulting Physician (Gastroenterology) Butch Penny, NP as Consulting Physician (Neurology) Adriana Simas Almyra Deforest, MD as Consulting Physician (Dermatology) Marykay Lex, MD as Consulting Physician (Cardiology)  PRE-OPERATIVE DIAGNOSIS:  Possible perforated colon  POST-OPERATIVE DIAGNOSIS:  Perforated proximal transverse colon  PROCEDURE: EXPLORATORY LAPAROTOMY, PRIMARY REPAIR PERFORATED COLON    Surgeon(s): Romie Levee, MD  ASSISTANT: none   ANESTHESIA:   general  EBL:  Total I/O In: 900 [I.V.:800; IV Piggyback:100] Out: 175 [Urine:100; Blood:75]  DRAINS: none   SPECIMEN:  No Specimen  DISPOSITION OF SPECIMEN:  N/A  COUNTS:  YES  PLAN OF CARE: Admit to inpatient   PATIENT DISPOSITION:  PACU - hemodynamically stable.  INDICATION: 57 y.o. F with UC on biologic medication who presented to the ED ~8h after colonoscopy with worsening abd pain.  CT indicates perforation on the R side   OR FINDINGS: Proximal transverse colon perforation (2-61mm) with minimal to no contamination  DESCRIPTION: the patient was identified in the preoperative holding area and taken to the OR where they were laid supine on the operating room table.  General anesthesia was induced without difficulty. SCDs were also noted to be in place prior to the initiation of anesthesia.  The patient was then prepped and draped in the usual sterile fashion.   A surgical timeout was performed indicating the correct patient, procedure, positioning and need for preoperative antibiotics.  I began by making a periumbilical midline incision using a 10 blade scalpel.  This was carried down through the subcutaneous tissue using electrocautery.  The peritoneum was entered at midline and incised.  An Alexis wound protector was placed.  I  palpated the entire colon.  There was emphysematous tissue in the right upper quadrant concerning for perforation.  I mobilized the right colon by releasing the lateral attachments using a LigaSure device.  Once hepatic flexure was freed, the colon was able to be mobilized of the wound.  There was a small perforation noted in the proximal transverse colon at the level of the branch of the middle colic artery.  There was minimal to no contamination due to overlying omentum.  I cleared the omentum off of the colon and identified the perforation.  It was approximately 2 to 3 mm in size.  I decided to perform a primary repair due to minimal contamination, a prepped colon and no signs of active inflammatory disease within the mucosa interiorly.  I resected the edges of the perforation until bleeding mucosa was identified.  His left knee with approximately 1 cm incision.  This was closed using interrupted 2-0 Vicryl Cannel sutures.  I then performed a omental patch over the incision using 2-0 Vicryl's to secure this to the colon.  This was completed, the abdomen was irrigated with approximately 2 L of warm normal saline.  Remainder of the colon was inspected and no other injuries were noted.  The omentum was then draped back over the abdominal contents and the fascia was closed using 2, #1 PDS running sutures.  Subcutaneous tissue was reapproximated using a 2-0 Vicryl suture.  Skin was closed with staples.  The patient was then extubated and sent to the postanesthesia care unit in stable condition.  The patient was hemodynamically stable throughout the entire procedure.  All counts were correct per operating room staff.  Helmut Muster  Jesse Sans, MD  Colorectal and General Surgery [facility]

## 2023-06-18 ENCOUNTER — Encounter (HOSPITAL_COMMUNITY): Payer: Self-pay | Admitting: General Surgery

## 2023-06-18 DIAGNOSIS — R569 Unspecified convulsions: Secondary | ICD-10-CM

## 2023-06-18 DIAGNOSIS — G2581 Restless legs syndrome: Secondary | ICD-10-CM

## 2023-06-18 DIAGNOSIS — G4730 Sleep apnea, unspecified: Secondary | ICD-10-CM

## 2023-06-18 DIAGNOSIS — E785 Hyperlipidemia, unspecified: Secondary | ICD-10-CM

## 2023-06-18 LAB — BASIC METABOLIC PANEL
Anion gap: 5 (ref 5–15)
BUN: 13 mg/dL (ref 6–20)
CO2: 24 mmol/L (ref 22–32)
Calcium: 8.2 mg/dL — ABNORMAL LOW (ref 8.9–10.3)
Chloride: 105 mmol/L (ref 98–111)
Creatinine, Ser: 0.89 mg/dL (ref 0.44–1.00)
GFR, Estimated: >60 mL/min (ref >60)
Glucose, Bld: 114 mg/dL — ABNORMAL HIGH (ref 70–99)
Potassium: 3.4 mmol/L — ABNORMAL LOW (ref 3.5–5.1)
Sodium: 134 mmol/L — ABNORMAL LOW (ref 135–145)

## 2023-06-18 LAB — CBC
HCT: 31.4 % — ABNORMAL LOW (ref 36.0–46.0)
Hemoglobin: 10.7 g/dL — ABNORMAL LOW (ref 12.0–15.0)
MCH: 31.6 pg (ref 26.0–34.0)
MCHC: 34.1 g/dL (ref 30.0–36.0)
MCV: 92.6 fL (ref 80.0–100.0)
Platelets: 240 10*3/uL (ref 150–400)
RBC: 3.39 MIL/uL — ABNORMAL LOW (ref 3.87–5.11)
RDW: 13.1 % (ref 11.5–15.5)
WBC: 7.1 10*3/uL (ref 4.0–10.5)
nRBC: 0 % (ref 0.0–0.2)

## 2023-06-18 LAB — MAGNESIUM: Magnesium: 2 mg/dL (ref 1.7–2.4)

## 2023-06-18 LAB — PHOSPHORUS: Phosphorus: 2.8 mg/dL (ref 2.5–4.6)

## 2023-06-18 MED ORDER — SODIUM CHLORIDE 0.9% FLUSH: 3.0000 mL | INTRAVENOUS | Status: DC | PRN

## 2023-06-18 MED ORDER — SODIUM CHLORIDE 0.9 % IV BOLUS
1000.0000 mL | Freq: Once | INTRAVENOUS | Status: AC
  Administered 2023-06-18: 1000 mL via INTRAVENOUS

## 2023-06-18 MED ORDER — SIMETHICONE 40 MG/0.6ML PO SUSP
80.0000 mg | Freq: Four times a day (QID) | ORAL | Status: DC | PRN
  Administered 2023-06-20: 80 mg via ORAL
  Filled 2023-06-18: qty 1.2

## 2023-06-18 MED ORDER — CALCIUM POLYCARBOPHIL 625 MG PO TABS
625.0000 mg | ORAL_TABLET | Freq: Two times a day (BID) | ORAL | Status: DC
  Administered 2023-06-18 – 2023-06-22 (×9): 625 mg via ORAL
  Filled 2023-06-18 (×9): qty 1

## 2023-06-18 MED ORDER — MAGIC MOUTHWASH: 15.0000 mL | Freq: Four times a day (QID) | ORAL | Status: DC | PRN

## 2023-06-18 MED ORDER — METHOCARBAMOL 500 MG PO TABS
1000.0000 mg | ORAL_TABLET | Freq: Four times a day (QID) | ORAL | Status: DC | PRN
  Administered 2023-06-22: 1000 mg via ORAL
  Filled 2023-06-18: qty 2

## 2023-06-18 MED ORDER — SODIUM CHLORIDE 0.9% FLUSH
3.0000 mL | Freq: Two times a day (BID) | INTRAVENOUS | Status: DC
  Administered 2023-06-18 – 2023-06-22 (×6): 3 mL via INTRAVENOUS

## 2023-06-18 MED ORDER — METHOCARBAMOL 1000 MG/10ML IJ SOLN: 1000.0000 mg | Freq: Four times a day (QID) | INTRAVENOUS | Status: DC | PRN

## 2023-06-18 MED ORDER — SODIUM CHLORIDE 0.9% FLUSH
3.0000 mL | Freq: Two times a day (BID) | INTRAVENOUS | Status: DC
  Administered 2023-06-18: 3 mL via INTRAVENOUS

## 2023-06-18 MED ORDER — LACTATED RINGERS IV BOLUS: 1000.0000 mL | Freq: Three times a day (TID) | INTRAVENOUS | Status: AC | PRN

## 2023-06-18 MED ORDER — MENTHOL 3 MG MT LOZG: 1.0000 | LOZENGE | OROMUCOSAL | Status: DC | PRN

## 2023-06-18 MED ORDER — BISACODYL 10 MG RE SUPP
10.0000 mg | Freq: Two times a day (BID) | RECTAL | Status: DC | PRN
  Administered 2023-06-20: 10 mg via RECTAL
  Filled 2023-06-18: qty 1

## 2023-06-18 MED ORDER — POTASSIUM CHLORIDE CRYS ER 20 MEQ PO TBCR
40.0000 meq | EXTENDED_RELEASE_TABLET | Freq: Two times a day (BID) | ORAL | Status: AC
  Administered 2023-06-18 – 2023-06-20 (×6): 40 meq via ORAL
  Filled 2023-06-18 (×6): qty 2

## 2023-06-18 MED ORDER — LACTATED RINGERS IV BOLUS
1000.0000 mL | Freq: Once | INTRAVENOUS | Status: AC
  Administered 2023-06-18: 1000 mL via INTRAVENOUS

## 2023-06-18 MED ORDER — PHENOL 1.4 % MT LIQD: 2.0000 | OROMUCOSAL | Status: DC | PRN

## 2023-06-18 MED ORDER — SODIUM CHLORIDE 0.9 % IV SOLN: 250.0000 mL | INTRAVENOUS | Status: DC | PRN

## 2023-06-18 NOTE — Plan of Care (Signed)
  Problem: Education: Goal: Knowledge of General Education information will improve Description Including pain rating scale, medication(s)/side effects and non-pharmacologic comfort measures Outcome: Progressing   Problem: Health Behavior/Discharge Planning: Goal: Ability to manage health-related needs will improve Outcome: Progressing   Problem: Clinical Measurements: Goal: Will remain free from infection Outcome: Progressing Goal: Diagnostic test results will improve Outcome: Progressing   Problem: Coping: Goal: Level of anxiety will decrease Outcome: Progressing   Problem: Pain Managment: Goal: General experience of comfort will improve Outcome: Progressing   Problem: Safety: Goal: Ability to remain free from injury will improve Outcome: Progressing   Problem: Skin Integrity: Goal: Risk for impaired skin integrity will decrease Outcome: Progressing

## 2023-06-18 NOTE — Evaluation (Signed)
Occupational Therapy Evaluation Patient Details Name: Melissa Cohen MRN: [id number] DOB: 10/27/1966 Today's Date: 06/18/2023   History of Present Illness Pt admitted from home with perforated colon and now s/p exploratory lap with primary repair.   Clinical Impression   OT eval . Pt doing well. Pt will have A at home as needed ( daughter will be there ) No further OT needed. No DME needs. No OT follow up     Recommendations for follow up therapy are one component of a multi-disciplinary discharge planning process, led by the attending physician.  Recommendations may be updated based on patient status, additional functional criteria and insurance authorization.   Assistance Recommended at Discharge PRN     Functional Status Assessment  Patient has had a recent decline in their functional status and demonstrates the ability to make significant improvements in function in a reasonable and predictable amount of time.  Equipment Recommendations          Precautions / Restrictions Precautions Precautions: Fall Restrictions Weight Bearing Restrictions: No      Mobility Bed Mobility Overal bed mobility: Modified Independent                  Transfers          Mod I                Balance Overall balance assessment: No apparent balance deficits (not formally assessed)                                         ADL either performed or assessed with clinical judgement   ADL Overall ADL's : At baseline          Daughter will A as needed if any needs arise                                   Vision Patient Visual Report: No change from baseline           Hand Dominance Right   Extremity/Trunk Assessment Upper Extremity Assessment Upper Extremity Assessment: Overall WFL for tasks assessed   Lower Extremity Assessment Lower Extremity Assessment: Overall WFL for tasks assessed       Communication  Communication Communication: No difficulties   Cognition Arousal/Alertness: Awake/alert Behavior During Therapy: WFL for tasks assessed/performed Overall Cognitive Status: Within Functional Limits for tasks assessed                                                  Home Living Family/patient expects to be discharged to:: Private residence Living Arrangements: Spouse/significant other Available Help at Discharge: Family Type of Home: House Home Access: Stairs to enter Secretary/administrator of Steps: 3 Entrance Stairs-Rails: Can reach both Home Layout: Two level     Bathroom Shower/Tub: Walk-in shower         Home Equipment: None          Prior Functioning/Environment Prior Level of Function : Independent/Modified Independent                                 OT Goals(Current goals can  be found in the care plan section) Acute Rehab OT Goals Patient Stated Goal: home with daughter OT Goal Formulation: With patient  OT Frequency:      Co-evaluation   Reason for Co-Treatment: For patient/therapist safety;To address functional/ADL transfers PT goals addressed during session: Mobility/safety with mobility OT goals addressed during session: ADL's and self-care      AM-PAC OT "6 Clicks" Daily Activity     Outcome Measure Help from another person eating meals?: None Help from another person taking care of personal grooming?: None Help from another person toileting, which includes using toliet, bedpan, or urinal?: None Help from another person bathing (including washing, rinsing, drying)?: A Little Help from another person to put on and taking off regular upper body clothing?: None Help from another person to put on and taking off regular lower body clothing?: A Little 6 Click Score: 22   End of Session Nurse Communication: Mobility status  Activity Tolerance: Patient tolerated treatment well Patient left: in chair;with call bell/phone  within reach                   Time: 1139-1200 OT Time Calculation (min): 21 min Charges:  OT General Charges $OT Visit: 1 Visit OT Evaluation $OT Eval Low Complexity: 1 Low  Lise Auer, OT Acute Rehabilitation Services  [phone]     Einar Crow D 06/18/2023, 3:35 PM

## 2023-06-18 NOTE — Plan of Care (Signed)
  Problem: Education: Goal: Knowledge of General Education information will improve Description: Including pain rating scale, medication(s)/side effects and non-pharmacologic comfort measures Outcome: Progressing   Problem: Health Behavior/Discharge Planning: Goal: Ability to manage health-related needs will improve Outcome: Progressing   Problem: Clinical Measurements: Goal: Ability to maintain clinical measurements within normal limits will improve Outcome: Progressing   Problem: Activity: Goal: Risk for activity intolerance will decrease Outcome: Progressing   Problem: Coping: Goal: Level of anxiety will decrease Outcome: Progressing   Problem: Elimination: Goal: Will not experience complications related to urinary retention Outcome: Progressing   Problem: Pain Managment: Goal: General experience of comfort will improve Outcome: Progressing   Problem: Safety: Goal: Ability to remain free from injury will improve Outcome: Progressing   Problem: Skin Integrity: Goal: Risk for impaired skin integrity will decrease Outcome: Progressing   

## 2023-06-18 NOTE — Evaluation (Signed)
Physical Therapy Evaluation Patient Details Name: Melissa Cohen MRN: [id number] DOB: 1965/12/05 Today's Date: 06/18/2023  History of Present Illness  Pt admitted from home with perforated colon and now s/p exploratory lap with primary repair.  Clinical Impression  Pt admitted as above and presenting with functional mobility limitations 2* post op pain and ambulatory balance deficits. Pt should progress well to dc home with family assist.      Assistance Recommended at Discharge Set up Supervision/Assistance  If plan is discharge home, recommend the following:  Can travel by private vehicle  A little help with bathing/dressing/bathroom;Assistance with cooking/housework;Assist for transportation;Help with stairs or ramp for entrance;A little help with walking and/or transfers        Equipment Recommendations None recommended by PT  Recommendations for Other Services       Functional Status Assessment Patient has had a recent decline in their functional status and demonstrates the ability to make significant improvements in function in a reasonable and predictable amount of time.     Precautions / Restrictions Precautions Precautions: Fall Restrictions Weight Bearing Restrictions: No      Mobility  Bed Mobility               General bed mobility comments: pt up in chair and requests back to same    Transfers Overall transfer level: Needs assistance Equipment used: Rolling walker (2 wheels) Transfers: Sit to/from Stand Sit to Stand: Min assist, Min guard           General transfer comment: Steady assist    Ambulation/Gait Ambulation/Gait assistance: Min assist, Min guard Gait Distance (Feet): 250 Feet Assistive device: None Gait Pattern/deviations: Step-through pattern, Decreased step length - right, Decreased step length - left, Shuffle, Trunk flexed, Wide base of support Gait velocity: decr     General Gait Details: Steady assist with decreased  pace  Stairs            Wheelchair Mobility     Tilt Bed    Modified Rankin (Stroke Patients Only)       Balance Overall balance assessment: Needs assistance Sitting-balance support: No upper extremity supported, Feet supported Sitting balance-Leahy Scale: Good     Standing balance support: No upper extremity supported Standing balance-Leahy Scale: Fair                               Pertinent Vitals/Pain      Home Living Family/patient expects to be discharged to:: Private residence Living Arrangements: Spouse/significant other Available Help at Discharge: Family Type of Home: House Home Access: Stairs to enter Entrance Stairs-Rails: Can reach both Entrance Stairs-Number of Steps: 3   Home Layout: Two level Home Equipment: None      Prior Function Prior Level of Function : Independent/Modified Independent                     Hand Dominance   Dominant Hand: Right    Extremity/Trunk Assessment   Upper Extremity Assessment Upper Extremity Assessment: Overall WFL for tasks assessed    Lower Extremity Assessment Lower Extremity Assessment: Overall WFL for tasks assessed       Communication   Communication: No difficulties  Cognition Arousal/Alertness: Awake/alert Behavior During Therapy: WFL for tasks assessed/performed Overall Cognitive Status: Within Functional Limits for tasks assessed  General Comments      Exercises     Assessment/Plan    PT Assessment Patient needs continued PT services  PT Problem List Decreased balance;Decreased mobility;Decreased activity tolerance;Pain       PT Treatment Interventions DME instruction;Gait training;Stair training;Functional mobility training;Therapeutic activities;Therapeutic exercise;Balance training;Patient/family education    PT Goals (Current goals can be found in the Care Plan section)  Acute Rehab PT  Goals Patient Stated Goal: REgain IND PT Goal Formulation: With patient Time For Goal Achievement: 07/01/23 Potential to Achieve Goals: Fair    Frequency Min 1X/week     Co-evaluation PT/OT/SLP Co-Evaluation/Treatment: Yes Reason for Co-Treatment: For patient/therapist safety;To address functional/ADL transfers PT goals addressed during session: Mobility/safety with mobility OT goals addressed during session: ADL's and self-care       AM-PAC PT "6 Clicks" Mobility  Outcome Measure Help needed turning from your back to your side while in a flat bed without using bedrails?: A Little Help needed moving from lying on your back to sitting on the side of a flat bed without using bedrails?: A Little Help needed moving to and from a bed to a chair (including a wheelchair)?: A Little Help needed standing up from a chair using your arms (e.g., wheelchair or bedside chair)?: A Little Help needed to walk in hospital room?: A Little Help needed climbing 3-5 steps with a railing? : A Lot 6 Click Score: 17    End of Session Equipment Utilized During Treatment: Gait belt Activity Tolerance: Patient tolerated treatment well Patient left: in chair;with call bell/phone within reach;with family/visitor present Nurse Communication: Mobility status PT Visit Diagnosis: Unsteadiness on feet (R26.81);Difficulty in walking, not elsewhere classified (R26.2)    Time: 1610-9604 PT Time Calculation (min) (ACUTE ONLY): 23 min   Charges:   PT Evaluation $PT Eval Low Complexity: 1 Low   PT General Charges $$ ACUTE PT VISIT: 1 Visit         Mauro Kaufmann PT Acute Rehabilitation Services Pager [phone] Office [phone]   Artisha Capri 06/18/2023, 1:08 PM

## 2023-06-18 NOTE — Progress Notes (Signed)
06/18/2023  Melissa Cohen [id number] [birth year]  CARE TEAM: PCP: Helane Rima, DO  Outpatient Care Team: Patient Care Team: Helane Rima, DO as PCP - General (Family Medicine) Meryl Dare, MD as Consulting Physician (Gastroenterology) Butch Penny, NP as Consulting Physician (Neurology) Adriana Simas Almyra Deforest, MD as Consulting Physician (Dermatology) Marykay Lex, MD as Consulting Physician (Cardiology)  Inpatient Treatment Team: Treatment Team:  [place], [place], MD Brien Few, PT [place], Gildardo Pounds, NT Pricilla Riffle, [facility] Dregely, Stormy Card, RN Clydia Llano, RN Myrle Sheng, OT Gwenlyn Fudge, [place] Redmond Baseman, RN   Problem List:   Principal Problem:   Perforation of colon as colonoscopy complication [facility])   06/17/2023  POST-OPERATIVE DIAGNOSIS:  Perforated proximal transverse colon   PROCEDURE:  EXPLORATORY LAPAROTOMY PRIMARY REPAIR PERFORATED COLON   Surgeon: Romie Levee, MD  OR FINDINGS: Proximal transverse colon perforation (2-75mm) with minimal to no contamination    Assessment [facility] Stay = 1 days) 1 Day Post-Op    OK    Plan:  -Tolerating clear liquid diets.  Try and advance to dysphagia 1/full code diet.  When has flatus/bowel movements advance to solid diet.  If worse, go back to sips.  NG tube if markedly worse.  Seems less likely.  -IV antibiotics x 5 days postop.  CT scan to rule out abscess if worse.  -Concern for lightheadedness when trying to get up.  Looks like she has had a vasovagal event a few years ago.  IV fluid bolus.  Check orthostatics.  Should resolve.  -No Foley catheter  -Hypokalemia.  Corrected.  Magnesium and phosphorus okay.  -Ulcerative colitis normally on chronic immunosuppression with upadacitinib/Rinvoq - HOLD for now  History of GERD.  Daily PPI.  Strongly prefers Nexium as the only medicine that works for her.  Usually has as needed H2  blocker.  Have then as needed.  Maalox.  Depression/anxiety.  Wellbutrin.    -VTE prophylaxis- SCDs, etc  -mobilize as tolerated to help recovery  -GERD.  -Disposition:  Disposition:  The patient is from: Home Anticipate discharge to:  Home Anticipated Date of Discharge is:  July 23,2024   Barriers to discharge:  Pending Clinical improvement (more likely than not)  Patient currently is NOT MEDICALLY STABLE for discharge from the hospital from a surgery standpoint.      I reviewed nursing notes, last 24 h vitals and pain scores, last 48 h intake and output, last 24 h labs and trends, and last 24 h imaging results.  I have reviewed this patient's available data, including medical history, events of note, test results, etc as part of my evaluation.   A significant portion of that time was spent in counseling. Care during the described time interval was provided by me.  This care required moderate level of medical decision making.  06/18/2023    Subjective: (Chief complaint)  Patient felt lightheaded trying to get up.  Feels better today.  Daughter in room.  Significant other on phone for my entire visit per patient's wishes.  Objective:  Vital signs:  Vitals:   06/17/23 1803 06/17/23 1925 06/18/23 0541 06/18/23 0636  BP: 106/70 109/61 (!) 81/58 97/65  Pulse: 84 79 77 71  Resp: 18 16 15    Temp: 98.4 F (36.9 C) 98.2 F (36.8 C) 98.5 F (36.9 C)   TempSrc: Oral Oral Oral   SpO2: 100% 98% 96%     Last BM Date : 06/16/23  Intake/Output  Yesterday:  07/19 0701 - 07/20 0700 In: 2706.3 [P.O.:1030; I.V.:1000; IV Piggyback:676.3] Out: 2000 [Urine:2000] This shift:  No intake/output data recorded.  Bowel function:  Flatus: No  BM:  No  Drain: (No drain)   Physical Exam:  General: Pt awake/alert in no acute distress.  Sitting up calm and relaxed.  Not toxic.  Not sickly. Eyes: PERRL, normal EOM.  Sclera clear.  No icterus Neuro: CN II-XII intact w/o  focal sensory/motor deficits. Lymph: No head/neck/groin lymphadenopathy Psych:  No delerium/psychosis/paranoia.  Oriented x 4 HENT: Normocephalic, Mucus membranes moist.  No thrush Neck: Supple, No tracheal deviation.  No obvious thyromegaly Chest: No pain to chest wall compression.  Good respiratory excursion.  No audible wheezing CV:  Pulses intact.  Regular rhythm.  No major extremity edema MS: Normal AROM mjr joints.  No obvious deformity  Abdomen: Soft.  Nondistended.  Mildly tender at incisions only.  No evidence of peritonitis.  No incarcerated hernias.  Ext:   No deformity.  No mjr edema.  No cyanosis Skin: No petechiae / purpurea.  No major sores.  Warm and dry    Results:   Cultures: No results found for this or any previous visit (from the past 720 hour(s)).  Labs: Results for orders placed or performed during the hospital encounter of 06/16/23 (from the past 48 hour(s))  CBC with Differential     Status: None   Collection Time: 06/16/23 10:40 PM  Result Value Ref Range   WBC 7.5 4.0 - 10.5 K/uL   RBC 3.97 3.87 - 5.11 MIL/uL   Hemoglobin 12.6 12.0 - 15.0 g/dL   HCT 42.5 95.6 - 38.7 %   MCV 94.0 80.0 - 100.0 fL   MCH 31.7 26.0 - 34.0 pg   MCHC 33.8 30.0 - 36.0 g/dL   RDW 56.4 33.2 - 95.1 %   Platelets 258 150 - 400 K/uL   nRBC 0.0 0.0 - 0.2 %   Neutrophils Relative % 84 %   Neutro Abs 6.3 1.7 - 7.7 K/uL   Lymphocytes Relative 10 %   Lymphs Abs 0.8 0.7 - 4.0 K/uL   Monocytes Relative 6 %   Monocytes Absolute 0.5 0.1 - 1.0 K/uL   Eosinophils Relative 0 %   Eosinophils Absolute 0.0 0.0 - 0.5 K/uL   Basophils Relative 0 %   Basophils Absolute 0.0 0.0 - 0.1 K/uL   Immature Granulocytes 0 %   Abs Immature Granulocytes 0.02 0.00 - 0.07 K/uL    Comment: Performed at [facility], [address] [place], [place] [address]  Comprehensive metabolic panel     Status: Abnormal   Collection Time: 06/16/23 10:40 PM  Result Value Ref Range   Sodium  137 135 - 145 mmol/L   Potassium 3.3 (L) 3.5 - 5.1 mmol/L   Chloride 106 98 - 111 mmol/L   CO2 22 22 - 32 mmol/L   Glucose, Bld 123 (H) 70 - 99 mg/dL    Comment: Glucose reference range applies only to samples taken after fasting for at least 8 hours.   BUN 11 6 - 20 mg/dL   Creatinine, Ser 6.06 0.44 - 1.00 mg/dL   Calcium 8.7 (L) 8.9 - 10.3 mg/dL   Total Protein 6.9 6.5 - 8.1 g/dL   Albumin 4.3 3.5 - 5.0 g/dL   AST 23 15 - 41 U/L   ALT 21 0 - 44 U/L   Alkaline Phosphatase 47 38 - 126 U/L   Total Bilirubin 0.9  0.3 - 1.2 mg/dL   GFR, Estimated >45 >40 mL/min    Comment: (NOTE) Calculated using the CKD-EPI Creatinine Equation (2021)    Anion gap 9 5 - 15    Comment: Performed at [facility], [address], [place], [place] [address]  Lipase, blood     Status: None   Collection Time: 06/16/23 10:40 PM  Result Value Ref Range   Lipase 44 11 - 51 U/L    Comment: Performed at [facility], [address], [place], [place] [address]  Type and screen     Status: None   Collection Time: 06/16/23 10:40 PM  Result Value Ref Range   ABO/RH(D) B NEG    Antibody Screen NEG    Sample Expiration      06/19/2023,2359 Performed at [facility], [address], [place], [place] [address]   ABO/Rh     Status: None   Collection Time: 06/17/23  1:24 AM  Result Value Ref Range   ABO/RH(D)      B NEG Performed at [facility], [address] [place], [place] [address]   CBC     Status: Abnormal   Collection Time: 06/18/23  5:30 AM  Result Value Ref Range   WBC 7.1 4.0 - 10.5 K/uL   RBC 3.39 (L) 3.87 - 5.11 MIL/uL   Hemoglobin 10.7 (L) 12.0 - 15.0 g/dL   HCT 78.4 (L) 69.6 - 29.5 %   MCV 92.6 80.0 - 100.0 fL   MCH 31.6 26.0 - 34.0 pg   MCHC 34.1 30.0 - 36.0 g/dL   RDW 28.4 13.2 - 44.0 %   Platelets 240 150 - 400 K/uL   nRBC 0.0 0.0 - 0.2 %    Comment: Performed at [facility], [address], [place], [place] [address]  Basic metabolic panel     Status: Abnormal   Collection Time: 06/18/23  5:30 AM  Result Value Ref Range   Sodium 134 (L) 135 - 145 mmol/L   Potassium 3.4 (L) 3.5 - 5.1 mmol/L   Chloride 105 98 - 111 mmol/L   CO2 24 22 - 32 mmol/L   Glucose, Bld 114 (H) 70 - 99 mg/dL    Comment: Glucose reference range applies only to samples taken after fasting for at least 8 hours.   BUN 13 6 - 20 mg/dL   Creatinine, Ser 5.36 0.44 - 1.00 mg/dL   Calcium 8.2 (L) 8.9 - 10.3 mg/dL   GFR, Estimated >64 >40 mL/min    Comment: (NOTE) Calculated using the CKD-EPI Creatinine Equation (2021)    Anion gap 5 5 - 15    Comment: Performed at [facility], [address], [place], [place] [address]    Imaging / Studies: DG Abd Portable 1V  Result Date: 06/17/2023 CLINICAL DATA:  Postoperative nausea and vomiting.  Abdominal pain. EXAM: PORTABLE ABDOMEN - 1 VIEW COMPARISON:  CT 06/16/2023 FINDINGS: Scattered gas and stool in the colon. No small or large bowel distention. Free air is suggested in the upper abdomen. Midline skin clips are consistent with recent surgery. Surgical clips in the right upper quadrant. IMPRESSION: Nonobstructive bowel gas pattern. Residual free air is likely postoperative. Electronically Signed   By: Burman Nieves M.D.   On: 06/17/2023 15:36   CT ANGIO GI BLEED  Result Date: 06/17/2023 CLINICAL DATA:  Colonoscopy today. Abdominal pain. Biopsies were taken during the colonoscopy. Passing a few clots.  EXAM: CTA ABDOMEN AND PELVIS WITHOUT AND WITH CONTRAST TECHNIQUE: Multidetector CT imaging of the abdomen and pelvis was performed using the standard protocol during bolus administration of intravenous contrast. Multiplanar reconstructed images and MIPs were obtained and reviewed to evaluate the vascular anatomy. RADIATION DOSE REDUCTION: This exam was performed according to the departmental dose-optimization program which includes  automated exposure control, adjustment of the mA and/or kV according to patient size and/or use of iterative reconstruction technique. CONTRAST:  OMNIPAQUE IOHEXOL 350 MG/ML SOLN COMPARISON:  MRI abdomen 04/15/2022 FINDINGS: VASCULAR Aorta: Normal caliber aorta without aneurysm, dissection, vasculitis or significant stenosis. Celiac: Patent without evidence of aneurysm, dissection, vasculitis or significant stenosis. SMA: Patent without evidence of aneurysm, dissection, vasculitis or significant stenosis. Renals: Both renal arteries are patent without evidence of aneurysm, dissection, vasculitis, fibromuscular dysplasia or significant stenosis. IMA: Patent without evidence of aneurysm, dissection, vasculitis or significant stenosis. Inflow: Patent without evidence of aneurysm, dissection, vasculitis or significant stenosis. Proximal Outflow: Bilateral common femoral and visualized portions of the superficial and profunda femoral arteries are patent without evidence of aneurysm, dissection, vasculitis or significant stenosis. Veins: No obvious venous abnormality within the limitations of this arterial phase study. Review of the MIP images confirms the above findings. NON-VASCULAR Lower chest: Partially visualized pneumomediastinum likely from air dissecting superiorly from pneumoperitoneum. Hepatobiliary: Cholecystectomy. No biliary dilation. No focal hepatic lesion. Pancreas: Unremarkable. Spleen: Unremarkable. Adrenals/Urinary Tract: Unremarkable adrenal glands. No urinary calculi or hydronephrosis. Unremarkable bladder. Stomach/Bowel: Large volume free intraperitoneal and retroperitoneal air. The distribution of free air is greatest in the right hemiabdomen suggesting the site of perforation may be near the hepatic flexure however no definite wall discontinuity is observed. No evidence of active bleeding. There is mild stranding along the colon at the hepatic flexure. Normal caliber large and small bowel.  Stomach is within normal limits. Lymphatic: Right ileocolic lymph nodes are mildly enlarged measuring up to 11 mm (series 6/image 123). Reproductive: No acute abnormality. Other: Small amount of low-density free fluid in the pelvis. Left paramidline ventral abdominal wall hernia containing fat and gas. Musculoskeletal: No acute fracture. IMPRESSION: 1. Large volume free intraperitoneal and retroperitoneal air. The distribution of free air is greatest in the right hemiabdomen suggesting the site of perforation may be near the hepatic flexure however no definite wall discontinuity is observed. 2. No evidence of active bleeding. 3. Partially visualized pneumomediastinum likely from air dissecting superiorly from pneumoperitoneum. 4. Indeterminate mildly enlarged right ileocolic lymph nodes. Critical Value/emergent results were called by telephone at the time of interpretation on 06/16/2023 at 11:59 pm to provider [facility] , who verbally acknowledged these results. Electronically Signed   By: Minerva Fester M.D.   On: 06/17/2023 00:10    Medications / Allergies: per chart  Antibiotics: Anti-infectives (From admission, onward)    Start     Dose/Rate Route Frequency Ordered Stop   06/17/23 0800  piperacillin-tazobactam (ZOSYN) IVPB 3.375 g        3.375 g 12.5 mL/hr over 240 Minutes Intravenous Every 8 hours 06/17/23 0411 06/22/23 0759   06/17/23 0015  piperacillin-tazobactam (ZOSYN) IVPB 3.375 g        3.375 g 12.5 mL/hr over 240 Minutes Intravenous Once 06/17/23 0000 06/17/23 0422         Note: Portions of this report may have been transcribed using voice recognition software. Every effort was made to ensure accuracy; however, inadvertent computerized transcription errors may be present.   Any transcriptional errors that result from this process  are unintentional.    Ardeth Sportsman, MD, FACS, MASCRS Esophageal, Gastrointestinal & Colorectal Surgery Robotic and Minimally Invasive  Surgery  [facility] [address] [place], [place] [address] [phone] Fax [phone] Main  CONTACT INFORMATION: Weekday (9AM-5PM): Call [facility] main office at [phone] Weeknight (5PM-9AM) or Weekend/Holiday: Check EPIC "Web Links" tab & use "AMION" (password " [email or web address]") for General Surgery [facility] coverage  Please, DO NOT use SecureChat  (it is not reliable communication to reach operating surgeons & will lead to a delay in care).   Epic staff messaging available for outptient concerns needing 1-2 business day response.      06/18/2023  9:55 AM

## 2023-06-19 LAB — CBC
HCT: 29.3 % — ABNORMAL LOW (ref 36.0–46.0)
Hemoglobin: 9.7 g/dL — ABNORMAL LOW (ref 12.0–15.0)
MCH: 31.4 pg (ref 26.0–34.0)
MCHC: 33.1 g/dL (ref 30.0–36.0)
MCV: 94.8 fL (ref 80.0–100.0)
Platelets: 236 10*3/uL (ref 150–400)
RBC: 3.09 MIL/uL — ABNORMAL LOW (ref 3.87–5.11)
RDW: 13.6 % (ref 11.5–15.5)
WBC: 6.6 10*3/uL (ref 4.0–10.5)
nRBC: 0 % (ref 0.0–0.2)

## 2023-06-19 LAB — POTASSIUM: Potassium: 4 mmol/L (ref 3.5–5.1)

## 2023-06-19 LAB — CREATININE, SERUM
Creatinine, Ser: 0.84 mg/dL (ref 0.44–1.00)
GFR, Estimated: >60 mL/min (ref >60)

## 2023-06-19 NOTE — Progress Notes (Signed)
06/19/2023  Melissa Cohen [id number] Aug 28, 1966  CARE TEAM: PCP: Helane Rima, DO  Outpatient Care Team: Patient Care Team: Helane Rima, DO as PCP - General (Family Medicine) Meryl Dare, MD as Consulting Physician (Gastroenterology) Butch Penny, NP as Consulting Physician (Neurology) Adriana Simas Almyra Deforest, MD as Consulting Physician (Dermatology) Marykay Lex, MD as Consulting Physician (Cardiology)  Inpatient Treatment Team: Treatment Team:  [place], [place], MD Milagros Evener, RN Rhona Leavens, LPN Devin Going Gildardo Pounds, NT   Problem List:   Principal Problem:   Perforation of colon as colonoscopy complication [facility]) Active Problems:   Ulcerative colitis (HCC)   GERD   INSOMNIA   Anxiety and depression   Chronic rhinitis   Vasovagal near syncope   Seizures (HCC)   Restless leg syndrome   Hyperlipidemia   Sleep apnea   06/17/2023  POST-OPERATIVE DIAGNOSIS:  Perforated proximal transverse colon   PROCEDURE:  EXPLORATORY LAPAROTOMY PRIMARY REPAIR PERFORATED COLON   Surgeon: Romie Levee, MD  OR FINDINGS: Proximal transverse colon perforation (2-54mm) with minimal to no contamination    Assessment [facility] Stay = 2 days) 2 Days Post-Op    Gradually recovering   Plan:  -Advance to soft diet.  If worse, go back to sips.  NG tube if markedly worse.  Seems less likely.  -IV antibiotics x 5 days postop.  CT scan to rule out abscess if worse.  -Concern for lightheadedness when trying to get up.  Looks like she has had a vasovagal event a few years ago.  IV fluid bolus.  Check orthostatics.  Should resolve.  -No Foley catheter  -Hypokalemia.  Corrected.  Magnesium and phosphorus okay.  -Ulcerative colitis normally on chronic immunosuppression with upadacitinib/Rinvoq - HOLD for now -suspect can restart in about 3-6 weeks.  Conversation between Dr. Maisie Fus and patient's gastroenterologist.  History of GERD.  Daily PPI.  Strongly prefers  Nexium as the only medicine that works for her.  Usually has as needed H2 blocker.  Have then as needed.  Maalox.  Depression/anxiety.  Wellbutrin.    -VTE prophylaxis- SCDs, etc  -mobilize as tolerated to help recovery  -GERD.  -Disposition:  Disposition:  The patient is from: Home Anticipate discharge to:  Home Anticipated Date of Discharge is:  July 23,2024   Barriers to discharge:  Pending Clinical improvement (more likely than not)  Patient currently is NOT MEDICALLY STABLE for discharge from the hospital from a surgery standpoint.      I reviewed nursing notes, last 24 h vitals and pain scores, last 48 h intake and output, last 24 h labs and trends, and last 24 h imaging results.  I have reviewed this patient's available data, including medical history, events of note, test results, etc as part of my evaluation.   A significant portion of that time was spent in counseling. Care during the described time interval was provided by me.  This care required moderate level of medical decision making.  06/19/2023    Subjective: (Chief complaint)  Patient walked more.  Some soreness but controlled.  Tolerating dysphagia with liquids.  Passing some gas but no bowel movements.  No postprandial crampiness belching or nausea.  Objective:  Vital signs:  Vitals:   06/18/23 1738 06/18/23 2104 06/19/23 0458 06/19/23 0618  BP:  108/69  99/63  Pulse:  98  94  Resp:  18  18  Temp:  99.2 F (37.3 C)  99.1 F (37.3 C)  TempSrc:  Oral  Oral  SpO2:  96%  95%  Weight: 72.6 kg  71.8 kg     Last BM Date : 06/16/23  Intake/Output   Yesterday:  07/20 0701 - 07/21 0700 In: 1098.7 [P.O.:960; IV Piggyback:138.7] Out: 1600 [Urine:1600] This shift:  No intake/output data recorded.  Bowel function:  Flatus: YES  BM:  No  Drain: (No drain)   Physical Exam:  General: Pt being but easily awakens to be awake/alert in no acute distress.  Sitting up calm and relaxed.   Not toxic.  Not sickly. Eyes: PERRL, normal EOM.  Sclera clear.  No icterus Neuro: CN II-XII intact w/o focal sensory/motor deficits. Lymph: No head/neck/groin lymphadenopathy Psych:  No delerium/psychosis/paranoia.  Oriented x 4 HENT: Normocephalic, Mucus membranes moist.  No thrush Neck: Supple, No tracheal deviation.  No obvious thyromegaly Chest: No pain to chest wall compression.  Good respiratory excursion.  No audible wheezing CV:  Pulses intact.  Regular rhythm.  No major extremity edema MS: Normal AROM mjr joints.  No obvious deformity  Abdomen: Soft.  Nondistended.  Mildly tender at incisions only. Clean dry and intact.  No evidence of peritonitis.  No incarcerated hernias.  Ext:   No deformity.  No mjr edema.  No cyanosis Skin: No petechiae / purpurea.  No major sores.  Warm and dry    Results:   Cultures: No results found for this or any previous visit (from the past 720 hour(s)).  Labs: Results for orders placed or performed during the hospital encounter of 06/16/23 (from the past 48 hour(s))  CBC     Status: Abnormal   Collection Time: 06/18/23  5:30 AM  Result Value Ref Range   WBC 7.1 4.0 - 10.5 K/uL   RBC 3.39 (L) 3.87 - 5.11 MIL/uL   Hemoglobin 10.7 (L) 12.0 - 15.0 g/dL   HCT 40.9 (L) 81.1 - 91.4 %   MCV 92.6 80.0 - 100.0 fL   MCH 31.6 26.0 - 34.0 pg   MCHC 34.1 30.0 - 36.0 g/dL   RDW 78.2 95.6 - 21.3 %   Platelets 240 150 - 400 K/uL   nRBC 0.0 0.0 - 0.2 %    Comment: Performed at [facility], [address] [place], [place] [address]  Basic metabolic panel     Status: Abnormal   Collection Time: 06/18/23  5:30 AM  Result Value Ref Range   Sodium 134 (L) 135 - 145 mmol/L   Potassium 3.4 (L) 3.5 - 5.1 mmol/L   Chloride 105 98 - 111 mmol/L   CO2 24 22 - 32 mmol/L   Glucose, Bld 114 (H) 70 - 99 mg/dL    Comment: Glucose reference range applies only to samples taken after fasting for at least 8 hours.   BUN 13 6 - 20 mg/dL    Creatinine, Ser 8.46 0.44 - 1.00 mg/dL   Calcium 8.2 (L) 8.9 - 10.3 mg/dL   GFR, Estimated >96 >29 mL/min    Comment: (NOTE) Calculated using the CKD-EPI Creatinine Equation (2021)    Anion gap 5 5 - 15    Comment: Performed at [facility], [address], [place], [place] [address]  Magnesium     Status: None   Collection Time: 06/18/23  5:30 AM  Result Value Ref Range   Magnesium 2.0 1.7 - 2.4 mg/dL    Comment: Performed at [facility], [address] [place], [place] [address]  Phosphorus     Status: None   Collection Time: 06/18/23  5:30 AM  Result Value Ref Range   Phosphorus 2.8 2.5 - 4.6 mg/dL    Comment: Performed at [facility], [address] [place], [place] [address]  CBC     Status: Abnormal   Collection Time: 06/19/23  5:43 AM  Result Value Ref Range   WBC 6.6 4.0 - 10.5 K/uL   RBC 3.09 (L) 3.87 - 5.11 MIL/uL   Hemoglobin 9.7 (L) 12.0 - 15.0 g/dL   HCT 19.1 (L) 47.8 - 29.5 %   MCV 94.8 80.0 - 100.0 fL   MCH 31.4 26.0 - 34.0 pg   MCHC 33.1 30.0 - 36.0 g/dL   RDW 62.1 30.8 - 65.7 %   Platelets 236 150 - 400 K/uL   nRBC 0.0 0.0 - 0.2 %    Comment: Performed at [facility], [address], [place], [place] [address]  Potassium     Status: None   Collection Time: 06/19/23  5:43 AM  Result Value Ref Range   Potassium 4.0 3.5 - 5.1 mmol/L    Comment: Performed at [facility], [address] [place], [place] [address]  Creatinine, serum     Status: None   Collection Time: 06/19/23  5:43 AM  Result Value Ref Range   Creatinine, Ser 0.84 0.44 - 1.00 mg/dL   GFR, Estimated >41 >32 mL/min    Comment: (NOTE) Calculated using the CKD-EPI Creatinine Equation (2021) Performed at [facility], [address] [place], [place] [address]     Imaging / Studies: DG Abd Portable 1V  Result Date: 06/17/2023 CLINICAL DATA:  Postoperative nausea and  vomiting.  Abdominal pain. EXAM: PORTABLE ABDOMEN - 1 VIEW COMPARISON:  CT 06/16/2023 FINDINGS: Scattered gas and stool in the colon. No small or large bowel distention. Free air is suggested in the upper abdomen. Midline skin clips are consistent with recent surgery. Surgical clips in the right upper quadrant. IMPRESSION: Nonobstructive bowel gas pattern. Residual free air is likely postoperative. Electronically Signed   By: Burman Nieves M.D.   On: 06/17/2023 15:36    Medications / Allergies: per chart  Antibiotics: Anti-infectives (From admission, onward)    Start     Dose/Rate Route Frequency Ordered Stop   06/17/23 0800  piperacillin-tazobactam (ZOSYN) IVPB 3.375 g        3.375 g 12.5 mL/hr over 240 Minutes Intravenous Every 8 hours 06/17/23 0411 06/22/23 0759   06/17/23 0015  piperacillin-tazobactam (ZOSYN) IVPB 3.375 g        3.375 g 12.5 mL/hr over 240 Minutes Intravenous Once 06/17/23 0000 06/17/23 0422         Note: Portions of this report may have been transcribed using voice recognition software. Every effort was made to ensure accuracy; however, inadvertent computerized transcription errors may be present.   Any transcriptional errors that result from this process are unintentional.    Ardeth Sportsman, MD, FACS, MASCRS Esophageal, Gastrointestinal & Colorectal Surgery Robotic and Minimally Invasive Surgery  [facility] [address] [place], [place] [address] [phone] Fax [phone] Main  CONTACT INFORMATION: Weekday (9AM-5PM): Call [facility] main office at [phone] Weeknight (5PM-9AM) or Weekend/Holiday: Check EPIC "Web Links" tab & use "AMION" (password " [email or web address]") for General Surgery [facility] coverage  Please, DO NOT use SecureChat  (it is not reliable communication to reach operating surgeons & will lead to a delay in care).   Epic staff messaging  available for outptient concerns needing 1-2  business day response.      06/19/2023  7:13 AM

## 2023-06-19 NOTE — Plan of Care (Signed)

## 2023-06-20 LAB — CREATININE, SERUM
Creatinine, Ser: 0.82 mg/dL (ref 0.44–1.00)
GFR, Estimated: >60 mL/min (ref >60)

## 2023-06-20 LAB — HEPATIC FUNCTION PANEL
ALT: 99 U/L — ABNORMAL HIGH (ref 0–44)
AST: 100 U/L — ABNORMAL HIGH (ref 15–41)
Albumin: 3.1 g/dL — ABNORMAL LOW (ref 3.5–5.0)
Alkaline Phosphatase: 91 U/L (ref 38–126)
Bilirubin, Direct: 0.3 mg/dL — ABNORMAL HIGH (ref 0.0–0.2)
Indirect Bilirubin: 0.3 mg/dL (ref 0.3–0.9)
Total Bilirubin: 0.6 mg/dL (ref 0.3–1.2)
Total Protein: 6.5 g/dL (ref 6.5–8.1)

## 2023-06-20 LAB — CBC
HCT: 31.5 % — ABNORMAL LOW (ref 36.0–46.0)
Hemoglobin: 10.2 g/dL — ABNORMAL LOW (ref 12.0–15.0)
MCH: 31.2 pg (ref 26.0–34.0)
MCHC: 32.4 g/dL (ref 30.0–36.0)
MCV: 96.3 fL (ref 80.0–100.0)
Platelets: 235 10*3/uL (ref 150–400)
RBC: 3.27 MIL/uL — ABNORMAL LOW (ref 3.87–5.11)
RDW: 13.4 % (ref 11.5–15.5)
WBC: 4.8 10*3/uL (ref 4.0–10.5)
nRBC: 0 % (ref 0.0–0.2)

## 2023-06-20 LAB — POTASSIUM: Potassium: 4.1 mmol/L (ref 3.5–5.1)

## 2023-06-20 MED ORDER — SENNA 8.6 MG PO TABS
1.0000 | ORAL_TABLET | Freq: Every day | ORAL | Status: DC
  Administered 2023-06-20 – 2023-06-21 (×2): 8.6 mg via ORAL
  Filled 2023-06-20 (×2): qty 1

## 2023-06-20 MED ORDER — POLYETHYLENE GLYCOL 3350 17 G PO PACK
17.0000 g | PACK | Freq: Every day | ORAL | Status: DC
  Administered 2023-06-20 – 2023-06-21 (×2): 17 g via ORAL
  Filled 2023-06-20 (×4): qty 1

## 2023-06-20 MED ORDER — FAMOTIDINE IN NACL 20-0.9 MG/50ML-% IV SOLN: 20.0000 mg | Freq: Two times a day (BID) | INTRAVENOUS | Status: DC | PRN

## 2023-06-20 NOTE — Plan of Care (Signed)
  Problem: Education: Goal: Knowledge of General Education information will improve Description: Including pain rating scale, medication(s)/side effects and non-pharmacologic comfort measures Outcome: Progressing   Problem: Safety: Goal: Ability to remain free from injury will improve Outcome: Progressing   

## 2023-06-20 NOTE — Progress Notes (Signed)
Progress Note  3 Days Post-Op  Subjective: Pt with some cramping abdominal pain still. No BM. Denies nausea or vomiting. Concern for jaundice, no known hx of Gilbert's disease.   Objective: Vital signs in last 24 hours: Temp:  [98 F (36.7 C)-98.7 F (37.1 C)] 98.5 F (36.9 C) (07/22 0413) Pulse Rate:  [74-80] 79 (07/22 0413) Resp:  [12-18] 18 (07/22 0413) BP: (106-115)/(63-72) 106/63 (07/22 0413) SpO2:  [96 %-100 %] 96 % (07/22 0413) Weight:  [70.4 kg] 70.4 kg (07/22 0417) Last BM Date : 06/16/23  Intake/Output from previous day: 07/21 0701 - 07/22 0700 In: 287.7 [P.O.:120; IV Piggyback:167.7] Out: 1600 [Urine:1600] Intake/Output this shift: Total I/O In: 273 [P.O.:240; IV Piggyback:33] Out: 350 [Urine:350]  PE: General: pleasant, WD, WN female who is laying in bed in NAD Heart: regular, rate, and rhythm. Palpable pedal pulses bilaterally Lungs:  Respiratory effort nonlabored Abd: soft, ND, appropriately ttp, incision C/D/I with staples present  Skin: does not appear jaundiced to me  Psych: A&Ox3 with an appropriate affect.    Lab Results:  Recent Labs    06/19/23 0543 06/20/23 0409  WBC 6.6 4.8  HGB 9.7* 10.2*  HCT 29.3* 31.5*  PLT 236 235   BMET Recent Labs    06/18/23 0530 06/19/23 0543 06/20/23 0409  NA 134*  --   --   K 3.4* 4.0 4.1  CL 105  --   --   CO2 24  --   --   GLUCOSE 114*  --   --   BUN 13  --   --   CREATININE 0.89 0.84 0.82  CALCIUM 8.2*  --   --    PT/INR No results for input(s): "LABPROT", "INR" in the last 72 hours. CMP     Component Value Date/Time   NA 134 (L) 06/18/2023 0530   NA 143 04/05/2022 0941   K 4.1 06/20/2023 0409   CL 105 06/18/2023 0530   CO2 24 06/18/2023 0530   GLUCOSE 114 (H) 06/18/2023 0530   BUN 13 06/18/2023 0530   BUN 12 04/05/2022 0941   CREATININE 0.82 06/20/2023 0409   CREATININE 0.85 09/24/2022 0851   CREATININE 0.95 03/14/2020 1441   CALCIUM 8.2 (L) 06/18/2023 0530   PROT 6.5 06/20/2023  1014   PROT 6.1 04/05/2022 0941   ALBUMIN 3.1 (L) 06/20/2023 1014   ALBUMIN 3.8 04/05/2022 0941   AST 100 (H) 06/20/2023 1014   AST 13 (L) 09/24/2022 0851   ALT 99 (H) 06/20/2023 1014   ALT 13 09/24/2022 0851   ALKPHOS 91 06/20/2023 1014   BILITOT 0.6 06/20/2023 1014   BILITOT 0.4 09/24/2022 0851   GFRNONAA >60 06/20/2023 0409   GFRNONAA >60 09/24/2022 0851   GFRAA 92 04/08/2020 1300   Lipase     Component Value Date/Time   LIPASE 44 06/16/2023 2240       Studies/Results: No results found.  Anti-infectives: Anti-infectives (From admission, onward)    Start     Dose/Rate Route Frequency Ordered Stop   06/17/23 0800  piperacillin-tazobactam (ZOSYN) IVPB 3.375 g        3.375 g 12.5 mL/hr over 240 Minutes Intravenous Every 8 hours 06/17/23 0411 06/22/23 0759   06/17/23 0015  piperacillin-tazobactam (ZOSYN) IVPB 3.375 g        3.375 g 12.5 mL/hr over 240 Minutes Intravenous Once 06/17/23 0000 06/17/23 0422        Assessment/Plan  Perforation of transverse colon s/p colonoscopy  POD3 s/p ex-lap  with primary resection and anastomosis 7/19 Dr. Maisie Fus - mild ileus - diet as tolerated and continue to mobilize - monitor orthostatic hypotension and can give bolus if needed but try TED today as well. Pt reports some orthostasis at baseline  - no leukocytosis and afebrile, ok to continue IV abx through POD5 can transition to PO if ready for DC prior to 5 days - hepatic function panel with mild elevation in AST/ALT but Tbili normal, can monitor    FEN: soft diet, ensure VTE: LMWH ID: [email or web address] 7/19>>  LOS: 3 days     Juliet Rude, [facility] 06/20/2023, 1:03 PM Please see Amion for pager number during day hours 7:00am-4:30pm

## 2023-06-20 NOTE — Progress Notes (Signed)
Physical Therapy Treatment Patient Details Name: Melissa Cohen MRN: [id number] DOB: 1966/07/07 Today's Date: 06/20/2023   History of Present Illness Pt admitted from home with perforated colon and now s/p exploratory lap with primary repair.    PT Comments  Pt progressing well this session. Amb 400' with no device to IV pole push;  pain controlled with mobility, no LOB. pt reports only very brief episode of dizziness which subsided.  No further PT needs, no f/u needs. Pt will benefit from continued amb with staff/family    Assistance Recommended at Discharge PRN  If plan is discharge home, recommend the following:  Can travel by private vehicle    Help with stairs or ramp for entrance      Equipment Recommendations  None recommended by PT    Recommendations for Other Services       Precautions / Restrictions Precautions Precautions: Fall Restrictions Weight Bearing Restrictions: No     Mobility  Bed Mobility               General bed mobility comments: pt up in chair and requests back to same    Transfers Overall transfer level: Modified independent                      Ambulation/Gait Ambulation/Gait assistance: Supervision, Modified independent (Device/Increase time) Gait Distance (Feet): 400 Feet Assistive device: IV Pole, None Gait Pattern/deviations: Step-through pattern       General Gait Details: no physical assist, pain controlled with mobility, no LOB. pt reports only very brief episode of dizziness which subsided   Stairs             Wheelchair Mobility     Tilt Bed    Modified Rankin (Stroke Patients Only)       Balance     Sitting balance-Leahy Scale: Good     Standing balance support: No upper extremity supported Standing balance-Leahy Scale:  (Fair+)                              Cognition Arousal/Alertness: Awake/alert Behavior During Therapy: WFL for tasks assessed/performed Overall  Cognitive Status: Within Functional Limits for tasks assessed                                          Exercises      General Comments        Pertinent Vitals/Pain Pain Assessment Pain Assessment: Faces Faces Pain Scale: Hurts a little bit Pain Location: abd incision Pain Descriptors / Indicators: Discomfort, Grimacing Pain Intervention(s): Limited activity within patient's tolerance, Monitored during session    Home Living                          Prior Function            PT Goals (current goals can now be found in the care plan section) Acute Rehab PT Goals Patient Stated Goal: REgain IND PT Goal Formulation: With patient Time For Goal Achievement: 07/01/23 Potential to Achieve Goals: Good Progress towards PT goals: Goals met/education completed, patient discharged from PT    Frequency           PT Plan Other (comment) (d/c PT)    Co-evaluation  AM-PAC PT "6 Clicks" Mobility   Outcome Measure  Help needed turning from your back to your side while in a flat bed without using bedrails?: None Help needed moving from lying on your back to sitting on the side of a flat bed without using bedrails?: None Help needed moving to and from a bed to a chair (including a wheelchair)?: None Help needed standing up from a chair using your arms (e.g., wheelchair or bedside chair)?: None Help needed to walk in hospital room?: None Help needed climbing 3-5 steps with a railing? : None 6 Click Score: 24    End of Session   Activity Tolerance: Patient tolerated treatment well Patient left: in chair;with call bell/phone within reach   PT Visit Diagnosis: Other abnormalities of gait and mobility (R26.89)     Time: 9147-8295 PT Time Calculation (min) (ACUTE ONLY): 15 min  Charges:    $Gait Training: 8-22 mins PT General Charges $$ ACUTE PT VISIT: 1 Visit                     Braylan Faul, PT  Acute Rehab Dept ([facility])  [phone]  06/20/2023    [facility] 06/20/2023, 2:03 PM

## 2023-06-20 NOTE — Progress Notes (Signed)
    Progress Note   Assessment    Colon perforation following colonoscopy with biopsies. S/P ex lap with primary repair of a 2-3 mm transverse colon defect and an omental patch  Ulcerative pancolitis with chronic colonic mucosal scarring and erythema. Ileitis noted, suspected backwash. Anemia, with Hgb stabilizing - 10.7 today Mild hypokalemia   Recommendations   Post op mgmt per [facility] Consider abdominal films if abdominal symptoms not improving Await pathology  Resume Rinvoq at 4 weeks post op - on or about August 18 Will not plan to see daily. Please contact GI if needed   Chief Complaint   Abdominal fullness, cramping. No BM. Minimal flatus. Ambulating in hallways  Vital signs in last 24 hours: Temp:  [98.4 F (36.9 C)-98.7 F (37.1 C)] 98.4 F (36.9 C) (07/22 1359) Pulse Rate:  [74-81] 81 (07/22 1359) Resp:  [12-18] 14 (07/22 1359) BP: (106-123)/(63-79) 123/79 (07/22 1359) SpO2:  [96 %-99 %] 99 % (07/22 1359) Weight:  [70.4 kg] 70.4 kg (07/22 0417) Last BM Date : 06/16/23  General: Alert, well-developed, in NAD Heart:  Regular rate and rhythm; no murmurs Chest: Clear to ascultation bilaterally Abdomen:  Soft, nontender and mildly distended. Bowel sounds present, without guarding, and without rebound.   Extremities:  Without edema. Neurologic:  Alert and  oriented x4; grossly normal neurologically. Psych:  Alert and cooperative. Normal mood and affect.  Intake/Output from previous day: 07/21 0701 - 07/22 0700 In: 287.7 [P.O.:120; IV Piggyback:167.7] Out: 1600 [Urine:1600] Intake/Output this shift: Total I/O In: 410 [P.O.:360; IV Piggyback:50] Out: 950 [Urine:950]  Lab Results: Recent Labs    06/18/23 0530 06/19/23 0543 06/20/23 0409  WBC 7.1 6.6 4.8  HGB 10.7* 9.7* 10.2*  HCT 31.4* 29.3* 31.5*  PLT 240 236 235   BMET Recent Labs    06/18/23 0530 06/19/23 0543 06/20/23 0409  NA 134*  --   --   K 3.4* 4.0 4.1  CL 105  --   --   CO2 24  --   --    GLUCOSE 114*  --   --   BUN 13  --   --   CREATININE 0.89 0.84 0.82  CALCIUM 8.2*  --   --    LFT Recent Labs    06/20/23 1014  PROT 6.5  ALBUMIN 3.1*  AST 100*  ALT 99*  ALKPHOS 91  BILITOT 0.6  BILIDIR 0.3*  IBILI 0.3     LOS: 3 days   Melissa Cohen T. Russella Dar, MD 06/20/2023, 3:44 PM See Loretha Stapler, [facility], to contact our on call provider

## 2023-06-21 ENCOUNTER — Other Ambulatory Visit (HOSPITAL_COMMUNITY): Payer: Self-pay

## 2023-06-21 LAB — CBC
HCT: 33.1 % — ABNORMAL LOW (ref 36.0–46.0)
Hemoglobin: 10.9 g/dL — ABNORMAL LOW (ref 12.0–15.0)
MCH: 31.6 pg (ref 26.0–34.0)
MCHC: 32.9 g/dL (ref 30.0–36.0)
MCV: 95.9 fL (ref 80.0–100.0)
Platelets: 258 10*3/uL (ref 150–400)
RBC: 3.45 MIL/uL — ABNORMAL LOW (ref 3.87–5.11)
RDW: 13.3 % (ref 11.5–15.5)
WBC: 4.1 10*3/uL (ref 4.0–10.5)
nRBC: 0 % (ref 0.0–0.2)

## 2023-06-21 LAB — POTASSIUM: Potassium: 4.1 mmol/L (ref 3.5–5.1)

## 2023-06-21 LAB — CREATININE, SERUM
Creatinine, Ser: 0.86 mg/dL (ref 0.44–1.00)
GFR, Estimated: >60 mL/min (ref >60)

## 2023-06-21 MED ORDER — GABAPENTIN 300 MG PO CAPS
300.0000 mg | ORAL_CAPSULE | Freq: Three times a day (TID) | ORAL | 0 refills | Status: DC
  Filled 2023-06-21: qty 90, 30d supply, fill #0

## 2023-06-21 MED ORDER — POLYETHYLENE GLYCOL 3350 17 G PO PACK: 17.0000 g | PACK | Freq: Every day | ORAL | Status: DC | PRN

## 2023-06-21 MED ORDER — LIDOCAINE 5 % EX PTCH
1.0000 | MEDICATED_PATCH | CUTANEOUS | 0 refills | Status: DC
  Filled 2023-06-21: qty 30, 30d supply, fill #0

## 2023-06-21 MED ORDER — PROCHLORPERAZINE MALEATE 5 MG PO TABS
5.0000 mg | ORAL_TABLET | Freq: Four times a day (QID) | ORAL | 0 refills | Status: DC | PRN
  Filled 2023-06-21: qty 10, 3d supply, fill #0

## 2023-06-21 MED ORDER — CALCIUM POLYCARBOPHIL 625 MG PO TABS
625.0000 mg | ORAL_TABLET | Freq: Two times a day (BID) | ORAL | 0 refills | Status: AC
  Filled 2023-06-21: qty 60, 30d supply, fill #0

## 2023-06-21 MED ORDER — AMOXICILLIN-POT CLAVULANATE 875-125 MG PO TABS
1.0000 | ORAL_TABLET | Freq: Two times a day (BID) | ORAL | 0 refills | Status: DC
  Filled 2023-06-21: qty 3, 1d supply, fill #0

## 2023-06-21 MED ORDER — CIPROFLOXACIN HCL 500 MG PO TABS
500.0000 mg | ORAL_TABLET | Freq: Two times a day (BID) | ORAL | Status: DC
  Administered 2023-06-21 – 2023-06-22 (×2): 500 mg via ORAL
  Filled 2023-06-21 (×2): qty 1

## 2023-06-21 MED ORDER — AMOXICILLIN-POT CLAVULANATE 875-125 MG PO TABS: 1.0000 | ORAL_TABLET | Freq: Two times a day (BID) | ORAL | Status: DC

## 2023-06-21 MED ORDER — ONDANSETRON HCL 4 MG PO TABS
4.0000 mg | ORAL_TABLET | ORAL | 0 refills | Status: DC
Start: 2023-06-21 — End: 2024-10-19
  Filled 2023-06-21: qty 20, 20d supply, fill #0

## 2023-06-21 MED ORDER — OXYCODONE HCL 5 MG PO TABS
5.0000 mg | ORAL_TABLET | Freq: Four times a day (QID) | ORAL | 0 refills | Status: DC | PRN
  Filled 2023-06-21: qty 30, 5d supply, fill #0

## 2023-06-21 MED ORDER — METRONIDAZOLE 500 MG PO TABS
500.0000 mg | ORAL_TABLET | Freq: Two times a day (BID) | ORAL | Status: DC
  Administered 2023-06-21 – 2023-06-22 (×2): 500 mg via ORAL
  Filled 2023-06-21 (×2): qty 1

## 2023-06-21 NOTE — Progress Notes (Signed)
Mobility Specialist - Progress Note   06/21/23 0955  Mobility  Activity Ambulated independently in hallway  Level of Assistance Independent  Assistive Device None  Distance Ambulated (ft) 500 ft  Activity Response Tolerated well  Mobility Referral Yes  $Mobility charge 1 Mobility  Mobility Specialist Start Time (ACUTE ONLY) [phone]  Mobility Specialist Stop Time (ACUTE ONLY) 0954  Mobility Specialist Time Calculation (min) (ACUTE ONLY) 11 min   Pt received in bed and agreeable to mobility. No complaints during session. Pt to bed after session with all needs met.    [facility]

## 2023-06-21 NOTE — Progress Notes (Signed)
Progress Note  4 Days Post-Op  Subjective: Pt with some LLQ abdominal pain still but having bowel function now. Has been taking antiemetic q6h so not sure if she is having nausea or not. Orthostasis still present but improving. Her daughter is on the phone and she plans to discharge with her when ready. They live in [place], [place]. She would prefer to go this evening if doing well through the morning.   Objective: Vital signs in last 24 hours: Temp:  [98.1 F (36.7 C)-99.4 F (37.4 C)] 98.1 F (36.7 C) (07/23 0614) Pulse Rate:  [81-109] 82 (07/23 0614) Resp:  [14-18] 18 (07/23 0614) BP: (98-136)/(72-92) 98/72 (07/23 0614) SpO2:  [97 %-100 %] 97 % (07/23 0614) Weight:  [71 kg] 71 kg (07/23 0637) Last BM Date : 06/21/23  Intake/Output from previous day: 07/22 0701 - 07/23 0700 In: 969.8 [P.O.:840; IV Piggyback:129.8] Out: 2200 [Urine:2200] Intake/Output this shift: Total I/O In: 240 [P.O.:240] Out: -   PE: General: pleasant, WD, WN female who is laying in bed in NAD Heart: regular, rate, and rhythm. Palpable pedal pulses bilaterally Lungs:  Respiratory effort nonlabored Abd: soft, ND, appropriately ttp, incision C/D/I with staples present  Skin: does not appear jaundiced to me  Psych: A&Ox3 with an appropriate affect.    Lab Results:  Recent Labs    06/20/23 0409 06/21/23 0451  WBC 4.8 4.1  HGB 10.2* 10.9*  HCT 31.5* 33.1*  PLT 235 258   BMET Recent Labs    06/20/23 0409 06/21/23 0451  K 4.1 4.1  CREATININE 0.82 0.86   PT/INR No results for input(s): "LABPROT", "INR" in the last 72 hours. CMP     Component Value Date/Time   NA 134 (L) 06/18/2023 0530   NA 143 04/05/2022 0941   K 4.1 06/21/2023 0451   CL 105 06/18/2023 0530   CO2 24 06/18/2023 0530   GLUCOSE 114 (H) 06/18/2023 0530   BUN 13 06/18/2023 0530   BUN 12 04/05/2022 0941   CREATININE 0.86 06/21/2023 0451   CREATININE 0.85 09/24/2022 0851   CREATININE 0.95 03/14/2020 1441   CALCIUM  8.2 (L) 06/18/2023 0530   PROT 6.5 06/20/2023 1014   PROT 6.1 04/05/2022 0941   ALBUMIN 3.1 (L) 06/20/2023 1014   ALBUMIN 3.8 04/05/2022 0941   AST 100 (H) 06/20/2023 1014   AST 13 (L) 09/24/2022 0851   ALT 99 (H) 06/20/2023 1014   ALT 13 09/24/2022 0851   ALKPHOS 91 06/20/2023 1014   BILITOT 0.6 06/20/2023 1014   BILITOT 0.4 09/24/2022 0851   GFRNONAA >60 06/21/2023 0451   GFRNONAA >60 09/24/2022 0851   GFRAA 92 04/08/2020 1300   Lipase     Component Value Date/Time   LIPASE 44 06/16/2023 2240       Studies/Results: No results found.  Anti-infectives: Anti-infectives (From admission, onward)    Start     Dose/Rate Route Frequency Ordered Stop   06/17/23 0800  piperacillin-tazobactam (ZOSYN) IVPB 3.375 g        3.375 g 12.5 mL/hr over 240 Minutes Intravenous Every 8 hours 06/17/23 0411 06/22/23 0759   06/17/23 0015  piperacillin-tazobactam (ZOSYN) IVPB 3.375 g        3.375 g 12.5 mL/hr over 240 Minutes Intravenous Once 06/17/23 0000 06/17/23 0422        Assessment/Plan  Perforation of transverse colon s/p colonoscopy  POD4 s/p ex-lap with primary resection and anastomosis 7/19 Dr. Maisie Fus - mild ileus - resolving  - monitor  orthostatic hypotension and can give bolus if needed, continue TED prn  - no leukocytosis and afebrile, ok to continue IV abx through POD5 can transition to PO if ready for DC prior to 5 days - possible DC later today if doing ok   FEN: soft diet, ensure VTE: LMWH ID: [email or web address] 7/19>>  LOS: 4 days     Juliet Rude, [facility] 06/21/2023, 10:56 AM Please see Amion for pager number during day hours 7:00am-4:30pm

## 2023-06-21 NOTE — Discharge Summary (Signed)
[facility] Discharge Summary   Patient ID: Melissa Cohen MRN: [id number] DOB/AGE: July 28, 1966 57 y.o.  Admit date: 06/16/2023 Discharge date: 06/21/2023  Admitting Diagnosis: Colon perforation after colonoscopy  Discharge Diagnosis Patient Active Problem List   Diagnosis Date Noted   Seizures (HCC)    Restless leg syndrome    Hyperlipidemia    Sleep apnea    Perforation of colon as colonoscopy complication (HCC) 06/17/2023   Low back pain 05/13/2021   Adhesive capsulitis of left shoulder 04/01/2021   Metabolic syndrome 02/08/2021   At risk for impaired metabolic function 12/09/2020   Polyphagia 11/04/2020   Mood disorder, with emotional eating 11/04/2020   At risk for activity intolerance 11/04/2020   Vasovagal near syncope 03/26/2020   Frequent falls 02/13/2020   HTN (hypertension) 02/13/2020   Systolic murmur 03/26/2017   Chronic rhinitis 03/17/2016   Menopausal syndrome (hot flushes) 08/23/2014   Screening for malignant neoplasm of the cervix 02/25/2014   General medical examination 12/07/2011   Anxiety and depression 10/08/2011   SACROILIAC JOINT DYSFUNCTION 04/12/2011   TROCHANTERIC BURSITIS, BILATERAL 04/12/2011   NEVUS, ATYPICAL 07/14/2010   Cervical radiculopathy 07/14/2010   GERD 12/31/2009   CARCINOMA, BASAL CELL 03/14/2008   Vitamin D deficiency 03/06/2008   HYPERTRIGLYCERIDEMIA 03/06/2008   Ulcerative colitis (HCC) 02/09/2007   INSOMNIA 02/09/2007    Consultants None  Imaging: No results found.  Procedures Dr. Maisie Fus (06/17/23) - EXPLORATORY LAPAROTOMY, PRIMARY REPAIR PERFORATED COLON   Hospital Course:  Melissa Cohen is a 57yo female who presented to the ED 06/16/23 with worsening abdominal pain after colonoscopy.  Workup revealed a colon perforation.  Patient was admitted and underwent procedure listed above.  Tolerated procedure well and was transferred to the floor.  Diet was advanced as tolerated.  She was kept on antibiotics  postoperatively. On POD5 she developed worsening abdominal pain. Work up reassuring with normal labs and no postoperative complications noted on CT scan. Pain improved and the patient was felt stable for discharge home.  Patient will follow up in our office in 2 weeks and knows to call with questions or concerns.    I or a member of my team have reviewed this patient in the Controlled Substance Database.   Allergies as of 06/21/2023       Reactions   Tagamet [cimetidine] Anaphylaxis   Effexor [venlafaxine] Swelling   Mobic [meloxicam] Swelling   Ultram [tramadol] Swelling        Medication List     STOP taking these medications    phentermine 37.5 MG tablet Commonly known as: ADIPEX-P       TAKE these medications    acetaminophen 500 MG tablet Commonly known as: TYLENOL Take 1,000 mg by mouth every 4 (four) hours as needed for moderate pain.   albuterol 108 (90 Base) MCG/ACT inhaler Commonly known as: VENTOLIN HFA Inhale 2 puffs into the lungs every 6 (six) hours as needed for wheezing or shortness of breath.   amoxicillin-clavulanate 875-125 MG tablet Commonly known as: AUGMENTIN Take 1 tablet by mouth 2 (two) times daily.   Biotin 29528 MCG Tabs Take 10,000 mcg by mouth daily.   buPROPion 300 MG 24 hr tablet Commonly known as: WELLBUTRIN XL Take 1 tablet (300 mg total) by mouth daily.   celecoxib 200 MG capsule Commonly known as: CELEBREX Take 1 capsule (200 mg total) by mouth 2 (two) times daily. What changed: when to take this   esomeprazole 40 MG capsule Commonly known as:  NEXIUM Take 1 capsule (40 mg total) by mouth daily.   famotidine 40 MG tablet Commonly known as: PEPCID TAKE ONE TABLET BY MOUTH ONE TIME DAILY What changed:  when to take this reasons to take this   gabapentin 300 MG capsule Commonly known as: NEURONTIN Take 1 capsule (300 mg total) by mouth 3 (three) times daily.   lidocaine 5 % Commonly known as: LIDODERM Place 1 patch  onto the skin daily. Remove & Discard patch within 12 hours or as directed by MD Start taking on: June 22, 2023   ondansetron 4 MG tablet Commonly known as: ZOFRAN Take 1 tablet (4 mg total) by mouth as directed.   oxyCODONE 5 MG immediate release tablet Commonly known as: Oxy IR/ROXICODONE Take 1-2 tablets (5-10 mg total) by mouth every 6 (six) hours as needed for moderate pain.   polycarbophil 625 MG tablet Commonly known as: FIBERCON Take 1 tablet (625 mg total) by mouth 2 (two) times daily.   polyethylene glycol 17 g packet Commonly known as: MIRALAX / GLYCOLAX Take 17 g by mouth daily as needed for mild constipation.   prochlorperazine 5 MG tablet Commonly known as: COMPAZINE Take 1 tablet (5 mg total) by mouth every 6 (six) hours as needed for refractory nausea / vomiting.   Rinvoq 30 MG Tb24 Generic drug: Upadacitinib ER Take 1 tablet (30 mg total) by mouth daily.   SUPER B COMPLEX PO Take 1 tablet by mouth every other day.   Victoza 18 MG/3ML Sopn Generic drug: liraglutide Inject 1.8 mg into the skin daily.   Vitamin D3 125 MCG (5000 UT) Caps Take 1 capsule by mouth daily.   zolpidem 5 MG tablet Commonly known as: AMBIEN Take 5-10 mg by mouth at bedtime.          Follow-up Information     Romie Levee, MD. Go on 07/05/2023.   Specialties: General Surgery, Colon and Rectal Surgery Why: 9:00 AM for staple removal and post-operative follow up. Please arrive 30 min prior to appointment time to check in. Contact information: [address] [place] [address] [phone]                 Signed: Juliet Rude , [facility] 06/21/2023, 2:33 PM Please see Amion for pager number during day hours 7:00am-4:30pm

## 2023-06-21 NOTE — Plan of Care (Signed)
  Problem: Clinical Measurements: Goal: Ability to maintain clinical measurements within normal limits will improve Outcome: Progressing Goal: Will remain free from infection Outcome: Progressing   

## 2023-06-21 NOTE — Discharge Instructions (Signed)
[facility]      [facility] °[phone] ° °OPEN ABDOMINAL SURGERY: POST OP INSTRUCTIONS ° °Always review your discharge instruction sheet given to you by the facility where your surgery was performed. ° °IF YOU HAVE DISABILITY OR FAMILY LEAVE FORMS, YOU MUST BRING THEM TO THE OFFICE FOR PROCESSING.  PLEASE DO NOT GIVE THEM TO YOUR DOCTOR. ° °A prescription for pain medication may be given to you upon discharge.  Take your pain medication as prescribed, if needed.  If narcotic pain medicine is not needed, then you may take acetaminophen (Tylenol) or ibuprofen (Advil) as needed. °Take your usually prescribed medications unless otherwise directed. °If you need a refill on your pain medication, please contact your pharmacy. They will contact our office to request authorization.  Prescriptions will not be filled after 5pm or on week-ends. °You should follow a light diet the first few days after arrival home, such as soup and crackers, pudding, etc.unless your doctor has advised otherwise. A high-fiber, low fat diet can be resumed as tolerated.   Be sure to include lots of fluids daily. Most patients will experience some swelling and bruising on the chest and neck area.  Ice packs will help.  Swelling and bruising can take several days to resolve °Most patients will experience some swelling and bruising in the area of the incision. Ice pack will help. Swelling and bruising can take several days to resolve..  °It is common to experience some constipation if taking pain medication after surgery.  Increasing fluid intake and taking a stool softener will usually help or prevent this problem from occurring.  A mild laxative (Milk of Magnesia or Miralax) should be taken according to package directions if there are no bowel movements after 48 hours. ° You may have steri-strips (small skin tapes) in place directly over the incision.  These strips should be left on the skin for 7-10 days.  If your surgeon used skin  glue on the incision, you may shower in 24 hours.  The glue will flake off over the next 2-3 weeks.  Any sutures or staples will be removed at the office during your follow-up visit. You may find that a light gauze bandage over your incision may keep your staples from being rubbed or pulled. You may shower and replace the bandage daily. °ACTIVITIES:  You may resume regular (light) daily activities beginning the next day--such as daily self-care, walking, climbing stairs--gradually increasing activities as tolerated.  You may have sexual intercourse when it is comfortable.  Refrain from any heavy lifting or straining until approved by your doctor. °You may drive when you no longer are taking prescription pain medication, you can comfortably wear a seatbelt, and you can safely maneuver your car and apply brakes ° °You should see your doctor in the office for a follow-up appointment approximately two weeks after your surgery.  Make sure that you call for this appointment within a day or two after you arrive home to insure a convenient appointment time. ° °WHEN TO CALL YOUR DOCTOR: °Fever over 101.0 °Inability to urinate °Nausea and/or vomiting °Extreme swelling or bruising °Continued bleeding from incision. °Increased pain, redness, or drainage from the incision. °Difficulty swallowing or breathing °Muscle cramping or spasms. °Numbness or tingling in hands or feet or around lips. ° °The clinic staff is available to answer your questions during regular business hours.  Please don’t hesitate to call and ask to speak to one of the nurses if you have concerns. ° °For   further questions, please visit [email or web address]  °

## 2023-06-22 ENCOUNTER — Inpatient Hospital Stay (HOSPITAL_COMMUNITY): Payer: 59

## 2023-06-22 ENCOUNTER — Other Ambulatory Visit: Payer: Self-pay

## 2023-06-22 ENCOUNTER — Other Ambulatory Visit (HOSPITAL_COMMUNITY): Payer: Self-pay

## 2023-06-22 ENCOUNTER — Encounter (HOSPITAL_COMMUNITY): Payer: Self-pay

## 2023-06-22 DIAGNOSIS — K7689 Other specified diseases of liver: Secondary | ICD-10-CM | POA: Diagnosis not present

## 2023-06-22 DIAGNOSIS — R1084 Generalized abdominal pain: Secondary | ICD-10-CM | POA: Diagnosis not present

## 2023-06-22 DIAGNOSIS — Z9049 Acquired absence of other specified parts of digestive tract: Secondary | ICD-10-CM | POA: Diagnosis not present

## 2023-06-22 DIAGNOSIS — K449 Diaphragmatic hernia without obstruction or gangrene: Secondary | ICD-10-CM | POA: Diagnosis not present

## 2023-06-22 DIAGNOSIS — R109 Unspecified abdominal pain: Secondary | ICD-10-CM | POA: Diagnosis not present

## 2023-06-22 LAB — COMPREHENSIVE METABOLIC PANEL
ALT: 86 U/L — ABNORMAL HIGH (ref 0–44)
AST: 63 U/L — ABNORMAL HIGH (ref 15–41)
Albumin: 3.4 g/dL — ABNORMAL LOW (ref 3.5–5.0)
Alkaline Phosphatase: 101 U/L (ref 38–126)
Anion gap: 11 (ref 5–15)
BUN: 12 mg/dL (ref 6–20)
CO2: 27 mmol/L (ref 22–32)
Calcium: 9.3 mg/dL (ref 8.9–10.3)
Chloride: 101 mmol/L (ref 98–111)
Creatinine, Ser: 0.73 mg/dL (ref 0.44–1.00)
GFR, Estimated: >60 mL/min (ref >60)
Glucose, Bld: 93 mg/dL (ref 70–99)
Potassium: 3.8 mmol/L (ref 3.5–5.1)
Sodium: 139 mmol/L (ref 135–145)
Total Bilirubin: 0.4 mg/dL (ref 0.3–1.2)
Total Protein: 7.2 g/dL (ref 6.5–8.1)

## 2023-06-22 LAB — CBC
HCT: 36.5 % (ref 36.0–46.0)
Hemoglobin: 11.8 g/dL — ABNORMAL LOW (ref 12.0–15.0)
MCH: 31.3 pg (ref 26.0–34.0)
MCHC: 32.3 g/dL (ref 30.0–36.0)
MCV: 96.8 fL (ref 80.0–100.0)
Platelets: 318 K/uL (ref 150–400)
RBC: 3.77 MIL/uL — ABNORMAL LOW (ref 3.87–5.11)
RDW: 13.3 % (ref 11.5–15.5)
WBC: 6.8 K/uL (ref 4.0–10.5)
nRBC: 0 % (ref 0.0–0.2)

## 2023-06-22 MED ORDER — IOHEXOL 300 MG/ML  SOLN
100.0000 mL | Freq: Once | INTRAMUSCULAR | Status: AC | PRN
  Administered 2023-06-22: 100 mL via INTRAVENOUS

## 2023-06-22 MED ORDER — SODIUM CHLORIDE (PF) 0.9 % IJ SOLN
INTRAMUSCULAR | Status: AC
  Filled 2023-06-22: qty 50

## 2023-06-22 MED ORDER — IOHEXOL 9 MG/ML PO SOLN
ORAL | Status: AC
  Filled 2023-06-22: qty 1000

## 2023-06-22 MED ORDER — METHOCARBAMOL 500 MG PO TABS
1000.0000 mg | ORAL_TABLET | Freq: Three times a day (TID) | ORAL | Status: DC
  Administered 2023-06-22 (×2): 1000 mg via ORAL
  Filled 2023-06-22 (×2): qty 2

## 2023-06-22 MED ORDER — IOHEXOL 9 MG/ML PO SOLN
500.0000 mL | ORAL | Status: AC
  Administered 2023-06-22 (×2): 500 mL via ORAL

## 2023-06-22 MED ORDER — METHOCARBAMOL 500 MG PO TABS
1000.0000 mg | ORAL_TABLET | Freq: Three times a day (TID) | ORAL | 0 refills | Status: DC
  Filled 2023-06-22: qty 30, 5d supply, fill #0

## 2023-06-22 NOTE — Progress Notes (Signed)
Progress Note  5 Days Post-Op  Subjective: Pt with increased pain this AM, more in RLQ and going up right side. Having bowel movements still. Less nausea. She is worried with increased pain. IV came out yesterday, discussed with RN to replace this AM.   Objective: Vital signs in last 24 hours: Temp:  [98.1 F (36.7 C)-98.6 F (37 C)] 98.6 F (37 C) (07/24 0551) Pulse Rate:  [82-94] 82 (07/24 0551) Resp:  [14-17] 16 (07/24 0551) BP: (100-125)/(72-77) 100/72 (07/24 0551) SpO2:  [97 %-100 %] 97 % (07/24 0551) Last BM Date : 06/21/23  Intake/Output from previous day: 07/23 0701 - 07/24 0700 In: 1450.3 [P.O.:1380; IV Piggyback:70.3] Out: 350 [Urine:350] Intake/Output this shift: No intake/output data recorded.  PE: General: pleasant, WD, WN female who is getting back to bed from bathroom and in pain Heart: regular, rate, and rhythm. Palpable pedal pulses bilaterally Lungs:  Respiratory effort nonlabored Abd: soft, mild distention, ttp in RLQ and LLQ Psych: A&Ox3 with an appropriate affect.    Lab Results:  Recent Labs    06/20/23 0409 06/21/23 0451  WBC 4.8 4.1  HGB 10.2* 10.9*  HCT 31.5* 33.1*  PLT 235 258   BMET Recent Labs    06/20/23 0409 06/21/23 0451  K 4.1 4.1  CREATININE 0.82 0.86   PT/INR No results for input(s): "LABPROT", "INR" in the last 72 hours. CMP     Component Value Date/Time   NA 134 (L) 06/18/2023 0530   NA 143 04/05/2022 0941   K 4.1 06/21/2023 0451   CL 105 06/18/2023 0530   CO2 24 06/18/2023 0530   GLUCOSE 114 (H) 06/18/2023 0530   BUN 13 06/18/2023 0530   BUN 12 04/05/2022 0941   CREATININE 0.86 06/21/2023 0451   CREATININE 0.85 09/24/2022 0851   CREATININE 0.95 03/14/2020 1441   CALCIUM 8.2 (L) 06/18/2023 0530   PROT 6.5 06/20/2023 1014   PROT 6.1 04/05/2022 0941   ALBUMIN 3.1 (L) 06/20/2023 1014   ALBUMIN 3.8 04/05/2022 0941   AST 100 (H) 06/20/2023 1014   AST 13 (L) 09/24/2022 0851   ALT 99 (H) 06/20/2023 1014   ALT  13 09/24/2022 0851   ALKPHOS 91 06/20/2023 1014   BILITOT 0.6 06/20/2023 1014   BILITOT 0.4 09/24/2022 0851   GFRNONAA >60 06/21/2023 0451   GFRNONAA >60 09/24/2022 0851   GFRAA 92 04/08/2020 1300   Lipase     Component Value Date/Time   LIPASE 44 06/16/2023 2240       Studies/Results: DG Abd Portable 1V  Result Date: 06/22/2023 CLINICAL DATA:  Generalized abdominal pain. Status post colon surgery. EXAM: PORTABLE ABDOMEN - 1 VIEW COMPARISON:  June 17, 2023. FINDINGS: The bowel gas pattern is normal. Status post cholecystectomy. Midline surgical staples are noted. IMPRESSION: No abnormal bowel dilatation. Electronically Signed   By: Lupita Raider M.D.   On: 06/22/2023 10:09    Anti-infectives: Anti-infectives (From admission, onward)    Start     Dose/Rate Route Frequency Ordered Stop   06/21/23 1800  ciprofloxacin (CIPRO) tablet 500 mg        500 mg Oral 2 times daily 06/21/23 1515 06/23/23 0759   06/21/23 1800  metroNIDAZOLE (FLAGYL) tablet 500 mg        500 mg Oral Every 12 hours 06/21/23 1515 06/23/23 0959   06/21/23 1545  amoxicillin-clavulanate (AUGMENTIN) 875-125 MG per tablet 1 tablet  Status:  Discontinued        1 tablet Oral  Every 12 hours 06/21/23 1451 06/21/23 1515   06/21/23 0000  amoxicillin-clavulanate (AUGMENTIN) 875-125 MG tablet        1 tablet Oral 2 times daily 06/21/23 1116 06/23/23 2359   06/17/23 0800  piperacillin-tazobactam (ZOSYN) IVPB 3.375 g  Status:  Discontinued        3.375 g 12.5 mL/hr over 240 Minutes Intravenous Every 8 hours 06/17/23 0411 06/21/23 1451   06/17/23 0015  piperacillin-tazobactam (ZOSYN) IVPB 3.375 g        3.375 g 12.5 mL/hr over 240 Minutes Intravenous Once 06/17/23 0000 06/17/23 0422        Assessment/Plan  Perforation of transverse colon s/p colonoscopy  POD5 s/p ex-lap with primary resection and anastomosis 7/19 Dr. Maisie Fus - increased pain this AM - film with stool that appears to be in right colon -  ?constipation - awaiting labs and stat CT this AM to eval for post-op complication - holding on DC for now - I updated her daughter by phone per patient request    FEN: NPO VTE: LMWH ID: [email or web address] 7/19> 7/23; PO cipro/flagyl - can stop after today   LOS: 5 days     Juliet Rude, [facility] 06/22/2023, 10:22 AM Please see Amion for pager number during day hours 7:00am-4:30pm

## 2023-06-23 ENCOUNTER — Encounter: Payer: Self-pay | Admitting: *Deleted

## 2023-06-23 ENCOUNTER — Other Ambulatory Visit (HOSPITAL_COMMUNITY): Payer: Self-pay

## 2023-06-23 ENCOUNTER — Telehealth: Payer: Self-pay | Admitting: *Deleted

## 2023-06-23 NOTE — Transitions of Care (Post Inpatient/ED Visit) (Signed)
   06/23/2023  Name: Melissa Cohen MRN: [id number] DOB: 11-22-1966  Today's TOC FU Call Status: Today's TOC FU Call Status:: Unsuccessul Call (1st Attempt) Unsuccessful Call (1st Attempt) Date: 06/23/23  Attempted to reach the patient regarding the most recent Inpatient visit; left HIPAA compliant voice message requesting call back  Follow Up Plan: Additional outreach attempts will be made to reach the patient to complete the Transitions of Care (Post Inpatient visit) call.   Caryl Pina, RN, BSN, CCRN Alumnus RN CM Care Coordination/ Transition of Care- [facility] Care Management [phone]: direct office

## 2023-06-24 ENCOUNTER — Encounter: Payer: Self-pay | Admitting: *Deleted

## 2023-06-24 ENCOUNTER — Telehealth: Payer: Self-pay | Admitting: *Deleted

## 2023-06-24 NOTE — Transitions of Care (Post Inpatient/ED Visit) (Signed)
06/24/2023  Name: Melissa Cohen MRN: [id number] DOB: 1966/07/18  Today's TOC FU Call Status: Today's TOC FU Call Status:: Successful TOC FU Call Competed TOC FU Call Complete Date: 06/24/23  Transition Care Management Follow-up Telephone Call Date of Discharge: 06/22/23 Discharge Facility: [facility]) Type of Discharge: Inpatient Admission Primary Inpatient Discharge Diagnosis:: colon perforation after colonoscopy; surgical exploratory lap How have you been since you were released from the hospital?: Better ("I am doing okay, pain under good control.  Able to do most everything I need to on my own.  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") Any questions or concerns?: No  Items Reviewed: Did you receive and understand the discharge instructions provided?: Yes (thoroughly reviewed with patient who verbalizes good understanding of same) Medications obtained,verified, and reconciled?: Yes (Medications Reviewed) (Full medication reconciliation/ review completed; no concerns or discrepancies identified; confirmed patient obtained/ is taking all newly Rx'd medications as instructed; self-manages medications and denies questions/ concerns around medications today) Any new allergies since your discharge?: No Dietary orders reviewed?: Yes Type of Diet Ordered:: "Progressing slowly from soft bland after surgery" Do you have support at home?: Yes People in Home: significant other Name of Support/Comfort Primary Source: Reports independent in self-care activities; supportive partner assists as/ if needed/ indicated  Medications Reviewed Today: Medications Reviewed Today     Reviewed by Michaela Corner, RN (Registered Nurse) on 06/24/23 at 1056  Med List Status: <None>   Medication Order Taking? Sig Documenting Provider Last Dose Status Informant  acetaminophen (TYLENOL) 500 MG tablet [id number] Yes Take 1,000 mg by mouth every 4 (four) hours as  needed for moderate pain. [provider] Taking Active Self, Pharmacy Records  albuterol (VENTOLIN HFA) 108 (90 Base) MCG/ACT inhaler [id number] Yes Inhale 2 puffs into the lungs every 6 (six) hours as needed for wheezing or shortness of breath. Sheliah Hatch, MD Taking Active Self, Pharmacy Records           Med Note Michaela Corner   Fri Jun 24, 2023 10:49 AM) 06/24/23: Reports during [facility] call has not needed recently   B Complex-C (SUPER B COMPLEX PO) [id number] Yes Take 1 tablet by mouth every other day. [provider] Taking Active Self, Pharmacy Records  Biotin 24401 MCG TABS [id number] Yes Take 10,000 mcg by mouth daily. [provider] Taking Active Self, Pharmacy Records  buPROPion (WELLBUTRIN XL) 300 MG 24 hr tablet [id number] Yes Take 1 tablet (300 mg total) by mouth daily. Sheliah Hatch, MD Taking Active Self, Pharmacy Records  celecoxib (CELEBREX) 200 MG capsule [id number] Yes Take 1 capsule (200 mg total) by mouth 2 (two) times daily.  Patient taking differently: Take 200 mg by mouth daily.   Sheliah Hatch, MD Taking Active Self, Pharmacy Records  Cholecalciferol (VITAMIN D3) 125 MCG (5000 UT) CAPS [id number] Yes Take 1 capsule by mouth daily. [provider] Taking Active Self, Pharmacy Records  esomeprazole (NEXIUM) 40 MG capsule [id number] Yes Take 1 capsule (40 mg total) by mouth daily. Meryl Dare, MD Taking Active Self, Pharmacy Records  famotidine (PEPCID) 40 MG tablet [id number] Yes TAKE ONE TABLET BY MOUTH ONE TIME DAILY  Patient taking differently: Take 40 mg by mouth daily as needed for heartburn or indigestion.   Meryl Dare, MD Taking Active Self, Pharmacy Records           Med Note (CRUTHIS, CHLOE C  Fri Jun 17, 2023 10:51 AM) Pt is unsure of last dose.   gabapentin (NEURONTIN) 300 MG capsule [id number] Yes Take 1 capsule (300 mg total) by mouth 3 (three) times daily. Juliet Rude, PA-C Taking Active    lidocaine (LIDODERM) 5 % [id number] Yes Place 1 patch onto the skin daily. Remove & Discard patch within 12 hours or as directed by MD Juliet Rude, PA-C Taking Active            Med Note Michaela Corner   Fri Jun 24, 2023 10:48 AM) 06/24/23: Reports during TOC call not currently needing   methocarbamol (ROBAXIN) 500 MG tablet [id number] Yes Take 2 tablets (1,000 mg total) by mouth 3 (three) times daily. Juliet Rude, PA-C Taking Active   ondansetron (ZOFRAN) 4 MG tablet [id number] Yes Take 1 tablet (4 mg) by mouth as directed. Juliet Rude, PA-C Taking Active   oxyCODONE (OXY IR/ROXICODONE) 5 MG immediate release tablet [id number] Yes Take 1 - 2 tablets by mouth every 6 hours as needed for moderate pain. Juliet Rude, PA-C Taking Active   polycarbophil (FIBERCON) 625 MG tablet [id number] No Take 1 tablet (625 mg) by mouth 2 times daily.  Patient not taking: Reported on 06/24/2023   Juliet Rude, PA-C Not Taking Active            Med Note Michaela Corner   Fri Jun 24, 2023 10:41 AM) 06/24/23: Reports during TOC call not taking due to having chronic colitis  polyethylene glycol (MIRALAX / GLYCOLAX) 17 g packet [id number] Yes Take 17 g by mouth daily as needed for mild constipation. Juliet Rude, PA-C Taking Active   prochlorperazine (COMPAZINE) 5 MG tablet [id number] Yes Take 1 tablet (5 mg total) by mouth every 6 (six) hours as needed for refractory nausea / vomiting. Juliet Rude, PA-C Taking Active   simethicone (MYLICON) 125 MG chewable tablet [id number] Yes Chew 240 mg by mouth every 6 (six) hours as needed for flatulence. Sheliah Hatch, MD Taking Active Self  Upadacitinib ER [facility]) [address] [place] RK27 [id number] Yes Take 1 tablet (30 mg total) by mouth daily. Meryl Dare, MD Taking Active Self, Pharmacy Records           Med Note Michaela Corner   Fri Jun 24, 2023 10:42 AM) 06/24/23: Reports during TOC call this is currently on-hold after recent surgery due to having  chronic colitis   VICTOZA 18 MG/3ML SOPN [id number] Yes Inject 1.8 mg into the skin daily. [provider] Taking Active Self, Pharmacy Records           Med Note Michaela Corner   Fri Jun 24, 2023 10:47 AM) 06/24/23: Reports during TOC call this is on hold due to recent surgery/ having chronic colitis   zolpidem (AMBIEN) 5 MG tablet [id number] Yes Take 5-10 mg by mouth at bedtime. [provider] Taking Active Self, Pharmacy Records  Med List Note Thomasene Lot, [place] 12/09/20 1407):             Home Care and Equipment/Supplies: Were Home Health Services Ordered?: No Any new equipment or medical supplies ordered?: No  Functional Questionnaire: Do you need assistance with bathing/showering or dressing?: No Do you need assistance with meal preparation?: No Do you need assistance with eating?: No Do you have difficulty maintaining continence: No Do you need assistance with getting out of bed/getting out of a chair/moving?: No Do you have difficulty managing or taking  your medications?: No  Follow up appointments reviewed: PCP Follow-up appointment confirmed?: NA (verified not indicated per hospital discharging provider discharge notes) Specialist Hospital Follow-up appointment confirmed?: Yes Date of Specialist follow-up appointment?: 07/05/23 Follow-Up Specialty Provider:: [facility] Do you need transportation to your follow-up appointment?: No Do you understand care options if your condition(s) worsen?: Yes-patient verbalized understanding  SDOH Interventions Today    Flowsheet Row Most Recent Value  SDOH Interventions   Food Insecurity Interventions Intervention Not Indicated  Transportation Interventions Intervention Not Indicated  [drives self,  family/ partner/ friends assists as/ if needed]      TOC Interventions Today    Flowsheet Row Most Recent Value  TOC Interventions   TOC Interventions Discussed/Reviewed TOC Interventions  Discussed, Post op wound/incision care, S/S of infection, Post discharge activity limitations per provider      Interventions Today    Flowsheet Row Most Recent Value  Chronic Disease   Chronic disease during today's visit Other  [colon perforation after colonscopy with surgical exploratory lap]  General Interventions   General Interventions Discussed/Reviewed General Interventions Discussed, Doctor Visits, Durable Medical Equipment (DME)  Doctor Visits Discussed/Reviewed Doctor Visits Discussed, PCP, Specialist  Durable Medical Equipment (DME) Other  [confirmed not currently requiring/ using assistive devices]  PCP/Specialist Visits Compliance with follow-up visit  Nutrition Interventions   Nutrition Discussed/Reviewed Nutrition Discussed  Pharmacy Interventions   Pharmacy Dicussed/Reviewed Pharmacy Topics Discussed  [Full medication review with updating medication list in EHR per patient report]      Caryl Pina, RN, BSN, CCRN Alumnus RN CM Care Coordination/ Transition of Care- [facility] Care Management [phone]: direct office

## 2023-06-27 ENCOUNTER — Encounter: Payer: Self-pay | Admitting: Gastroenterology

## 2023-06-29 NOTE — Telephone Encounter (Signed)
Inbound call from patient inuring about results. Please advise.   Thank you

## 2023-06-30 NOTE — Telephone Encounter (Signed)
The pt has been advised that a letter has been sent to her via Mail and My Chart. We did discuss the contents and all questions answered.

## 2023-06-30 NOTE — Telephone Encounter (Signed)
MACAYLA LEEDS                                                                                                      [address] [place] [address]                                                                                Dear Ms. Fricker,   I am writing to inform you that the biopsies taken during your recent colonoscopy showed minimally active chronic ileitis and normal colon biopsies with inflammation or dysplasia.     I recommend that you have a repeat colonoscopy in 3 years.   Please call us at [phone]   if you have persistent problems or have questions about your condition that have not been fully answered at this time.     Sincerely,   Meryl Dare, MD          [facility]                                Chuck Hint, [phone]                                 1      Print Letter

## 2023-07-04 ENCOUNTER — Other Ambulatory Visit: Payer: Self-pay | Admitting: Gastroenterology

## 2023-07-12 ENCOUNTER — Telehealth: Payer: Self-pay

## 2023-07-12 NOTE — Telephone Encounter (Signed)
Diarrhea and 11/5 REV noted

## 2023-07-12 NOTE — Telephone Encounter (Signed)
The pt has been advised of the need for follow up but states she will be out of town until Nov 4.  Appt moved to 11/5 with MS.  She will restart Rinvoq on 07/17/23.  She states she is doing very well other than diarrhea-she is anxious to restart the Rinvoq. She will call if she does not begin to improve after restarting.  FYI Dr Russella Dar.

## 2023-07-12 NOTE — Telephone Encounter (Signed)
Left message on machine to call back   Appt made for 09/14/23 at 3:20 pm

## 2023-07-12 NOTE — Telephone Encounter (Signed)
-----   Message from [place] T. Russella Dar sent at 07/12/2023  9:05 AM EDT ----- Alexia Freestone,  Unfortunately Ms Hertzler had a small colon perf after colonoscopy in July. She underwent surgical repair, she had a good hospital recovery and was discharged. She had follow up with Dr. Maisie Fus on 8/6. She has UC and Rinvoq has been held since her surgery.  Please call her to check on her status, instruct her to resume Rinvoq 30 mg po qd on Aug 18 and schedule an REV with me in 1-2 months.  MS

## 2023-08-09 DIAGNOSIS — Z9884 Bariatric surgery status: Secondary | ICD-10-CM | POA: Diagnosis not present

## 2023-08-09 DIAGNOSIS — Z9889 Other specified postprocedural states: Secondary | ICD-10-CM | POA: Diagnosis not present

## 2023-08-09 DIAGNOSIS — G4709 Other insomnia: Secondary | ICD-10-CM | POA: Diagnosis not present

## 2023-08-09 DIAGNOSIS — Z8639 Personal history of other endocrine, nutritional and metabolic disease: Secondary | ICD-10-CM | POA: Diagnosis not present

## 2023-08-09 DIAGNOSIS — R7301 Impaired fasting glucose: Secondary | ICD-10-CM | POA: Diagnosis not present

## 2023-08-09 DIAGNOSIS — R110 Nausea: Secondary | ICD-10-CM | POA: Diagnosis not present

## 2023-08-09 DIAGNOSIS — E663 Overweight: Secondary | ICD-10-CM | POA: Diagnosis not present

## 2023-08-09 DIAGNOSIS — Z6826 Body mass index (BMI) 26.0-26.9, adult: Secondary | ICD-10-CM | POA: Diagnosis not present

## 2023-09-03 DIAGNOSIS — I10 Essential (primary) hypertension: Secondary | ICD-10-CM | POA: Diagnosis not present

## 2023-09-03 DIAGNOSIS — Z8249 Family history of ischemic heart disease and other diseases of the circulatory system: Secondary | ICD-10-CM | POA: Diagnosis not present

## 2023-09-03 DIAGNOSIS — M069 Rheumatoid arthritis, unspecified: Secondary | ICD-10-CM | POA: Diagnosis not present

## 2023-09-03 DIAGNOSIS — Z87891 Personal history of nicotine dependence: Secondary | ICD-10-CM | POA: Diagnosis not present

## 2023-09-03 DIAGNOSIS — Z794 Long term (current) use of insulin: Secondary | ICD-10-CM | POA: Diagnosis not present

## 2023-09-03 DIAGNOSIS — M1990 Unspecified osteoarthritis, unspecified site: Secondary | ICD-10-CM | POA: Diagnosis not present

## 2023-09-03 DIAGNOSIS — G40909 Epilepsy, unspecified, not intractable, without status epilepticus: Secondary | ICD-10-CM | POA: Diagnosis not present

## 2023-09-03 DIAGNOSIS — K219 Gastro-esophageal reflux disease without esophagitis: Secondary | ICD-10-CM | POA: Diagnosis not present

## 2023-09-03 DIAGNOSIS — Z833 Family history of diabetes mellitus: Secondary | ICD-10-CM | POA: Diagnosis not present

## 2023-09-03 DIAGNOSIS — K5190 Ulcerative colitis, unspecified, without complications: Secondary | ICD-10-CM | POA: Diagnosis not present

## 2023-09-03 DIAGNOSIS — J45909 Unspecified asthma, uncomplicated: Secondary | ICD-10-CM | POA: Diagnosis not present

## 2023-09-03 DIAGNOSIS — E1140 Type 2 diabetes mellitus with diabetic neuropathy, unspecified: Secondary | ICD-10-CM | POA: Diagnosis not present

## 2023-09-14 ENCOUNTER — Ambulatory Visit: Payer: 59 | Admitting: Gastroenterology

## 2023-10-04 ENCOUNTER — Ambulatory Visit (INDEPENDENT_AMBULATORY_CARE_PROVIDER_SITE_OTHER): Payer: 59 | Admitting: Gastroenterology

## 2023-10-04 ENCOUNTER — Other Ambulatory Visit (INDEPENDENT_AMBULATORY_CARE_PROVIDER_SITE_OTHER): Payer: 59

## 2023-10-04 ENCOUNTER — Encounter: Payer: Self-pay | Admitting: Gastroenterology

## 2023-10-04 VITALS — BP 110/70 | HR 88 | Ht 62.0 in | Wt 163.0 lb

## 2023-10-04 DIAGNOSIS — K5100 Ulcerative (chronic) pancolitis without complications: Secondary | ICD-10-CM | POA: Diagnosis not present

## 2023-10-04 LAB — CBC WITH DIFFERENTIAL/PLATELET
Basophils Absolute: 0 10*3/uL (ref 0.0–0.1)
Basophils Relative: 0.4 % (ref 0.0–3.0)
Eosinophils Absolute: 0 10*3/uL (ref 0.0–0.7)
Eosinophils Relative: 0.6 % (ref 0.0–5.0)
HCT: 38.9 % (ref 36.0–46.0)
Hemoglobin: 13.1 g/dL (ref 12.0–15.0)
Lymphocytes Relative: 42.4 % (ref 12.0–46.0)
Lymphs Abs: 2.5 10*3/uL (ref 0.7–4.0)
MCHC: 33.7 g/dL (ref 30.0–36.0)
MCV: 91.7 fL (ref 78.0–100.0)
Monocytes Absolute: 0.4 10*3/uL (ref 0.1–1.0)
Monocytes Relative: 6.8 % (ref 3.0–12.0)
Neutro Abs: 3 10*3/uL (ref 1.4–7.7)
Neutrophils Relative %: 49.8 % (ref 43.0–77.0)
Platelets: 428 10*3/uL — ABNORMAL HIGH (ref 150.0–400.0)
RBC: 4.24 Mil/uL (ref 3.87–5.11)
RDW: 16.7 % — ABNORMAL HIGH (ref 11.5–15.5)
WBC: 5.9 10*3/uL (ref 4.0–10.5)

## 2023-10-04 LAB — COMPREHENSIVE METABOLIC PANEL
ALT: 23 U/L (ref 0–35)
AST: 24 U/L (ref 0–37)
Albumin: 4.2 g/dL (ref 3.5–5.2)
Alkaline Phosphatase: 60 U/L (ref 39–117)
BUN: 11 mg/dL (ref 6–23)
CO2: 28 meq/L (ref 19–32)
Calcium: 9.3 mg/dL (ref 8.4–10.5)
Chloride: 105 meq/L (ref 96–112)
Creatinine, Ser: 0.83 mg/dL (ref 0.40–1.20)
GFR: 77.25 mL/min (ref >60.00)
Glucose, Bld: 87 mg/dL (ref 70–99)
Potassium: 4.3 meq/L (ref 3.5–5.1)
Sodium: 140 meq/L (ref 135–145)
Total Bilirubin: 0.3 mg/dL (ref 0.2–1.2)
Total Protein: 7 g/dL (ref 6.0–8.3)

## 2023-10-04 LAB — HIGH SENSITIVITY CRP: CRP, High Sensitivity: <0.2 mg/L (ref 0.000–5.000)

## 2023-10-04 LAB — SEDIMENTATION RATE: Sed Rate: 10 mm/h (ref 0–30)

## 2023-10-04 NOTE — Progress Notes (Signed)
Assessment     Pan ulcerative colitis. Colonoscopy induced perforation - S/P ex lap primary repair in July Suspected backwash ileitis  GERD Hepatic steatosis with mildly elevated transaminases    Recommendations    Continue Rinvoq 30 mg po qd CBC, CMP, ESR, CRP, fecal calprotectin  Continue Nexium 40 mg daily, famotidine 40 mg at bedtime Consider surveillance colonoscopy in July 2027 REV with Dr. Doy Hutching in 6 months   HPI    [name] with pan ulcerative colitis feeling very well except for 3 loose stools/day.  Unfortunately she suffered a small perforation in her transverse colon at colonoscopy in July.  She underwent exploratory laparotomy with primary repair and has had a very good recovery.  Surveillance biopsies or scope related injury are potential causes of perforation.  She continues on Rinvoq and feels very well except for 2-3 loose stools per day.  She denies abdominal pain, bleeding.  She states she generally has a loose bowel movement within 30 minutes of a meal.  She has noted a slight weight gain.  She just returned from a cruise in the [place].  Colon biopsies were negative for inflammatory or dysplastic changes.  Terminal ileum biopsy showed active chronic ileitis.  - A few erosions in the terminal ileum. Biopsied.  - Pancolitis ulcerative colitis, unchanged since last examination. Biopsied.  - Multiple small pseudopolyps in the rectum and in the sigmoid colon.  - External and internal hemorrhoids.  - The examination was otherwise normal on direct and retroflexion views.  1. Surgical [P], small bowel, ileum erosions - MINIMALLY ACTIVE CHRONIC ILEITIS - NEGATIVE FOR DYSPLASIA OR MALIGNANCY 2. Surgical [P], random right colon sites - BENIGN COLONIC MUCOSA WITH NO SPECIFIC PATHOLOGIC CHANGES - NEGATIVE FOR FEATURES OF CHRONICITY OR ACUTE INFLAMMATION - NEGATIVE FOR DYSPLASIA OR MALIGNANCY 3. Surgical [P], random left colon sites - BENIGN  COLONIC MUCOSA WITH NO SPECIFIC PATHOLOGIC CHANGES - NEGATIVE FOR FEATURES OF CHRONICITY OR ACUTE INFLAMMATION - NEGATIVE FOR DYSPLASIA OR MALIGNANCY   Labs / Imaging       Latest Ref Rng & Units 06/22/2023    9:55 AM 06/20/2023   10:14 AM 06/16/2023   10:40 PM  Hepatic Function  Total Protein 6.5 - 8.1 g/dL 7.2  6.5  6.9   Albumin 3.5 - 5.0 g/dL 3.4  3.1  4.3   AST 15 - 41 U/L 63  100  23   ALT 0 - 44 U/L 86  99  21   Alk Phosphatase 38 - 126 U/L 101  91  47   Total Bilirubin 0.3 - 1.2 mg/dL 0.4  0.6  0.9   Bilirubin, Direct 0.0 - 0.2 mg/dL  0.3         Latest Ref Rng & Units 06/22/2023    9:55 AM 06/21/2023    4:51 AM 06/20/2023    4:09 AM  CBC  WBC 4.0 - 10.5 K/uL 6.8  4.1  4.8   Hemoglobin 12.0 - 15.0 g/dL 01.7  51.0  25.8   Hematocrit 36.0 - 46.0 % 36.5  33.1  31.5   Platelets 150 - 400 K/uL 318  258  235     Current Medications, Allergies, Past Medical History, Past Surgical History, Family History and Social History were reviewed in [employer] record.   Physical Exam: General: Well developed, well nourished, no acute distress Head: Normocephalic and atraumatic Eyes: Sclerae anicteric, EOMI Ears: Normal auditory acuity Mouth: No  deformities or lesions noted Lungs: Clear throughout to auscultation Heart: Regular rate and rhythm; No murmurs, rubs or bruits Abdomen: Soft, non tender and non distended. Well healed midline incision. No masses, hepatosplenomegaly or hernias noted. Normal Bowel sounds Rectal: Not done Musculoskeletal: Symmetrical with no gross deformities  Pulses:  Normal pulses noted Extremities: No edema or deformities noted Neurological: Alert oriented x 4, grossly nonfocal Psychological:  Alert and cooperative. Normal mood and affect   Melissa Fleeman T. Russella Dar, MD 10/04/2023, 2:00 PM

## 2023-10-04 NOTE — Patient Instructions (Signed)
Your provider has requested that you go to the basement level for lab work before leaving today. Press "B" on the elevator. The lab is located at the first door on the left as you exit the elevator.  Follow up with Dr. Doy Hutching.  The [facility] providers would like to encourage you to use [facility] to communicate with providers for non-urgent requests or questions.  Due to long hold times on the telephone, sending your provider a message by [facility] may be a faster and more efficient way to get a response.  Please allow 48 business hours for a response.  Please remember that this is for non-urgent requests.   Due to recent changes in healthcare laws, you may see the results of your imaging and laboratory studies on MyChart before your provider has had a chance to review them.  We understand that in some cases there may be results that are confusing or concerning to you. Not all laboratory results come back in the same time frame and the provider may be waiting for multiple results in order to interpret others.  Please give [place] 48 hours in order for your provider to thoroughly review all the results before contacting the office for clarification of your results.   Thank you for choosing me and [facility].  Venita Lick. Pleas Koch., MD., Clementeen Graham

## 2023-10-11 DIAGNOSIS — G4709 Other insomnia: Secondary | ICD-10-CM | POA: Diagnosis not present

## 2023-10-11 DIAGNOSIS — K760 Fatty (change of) liver, not elsewhere classified: Secondary | ICD-10-CM | POA: Diagnosis not present

## 2023-10-11 DIAGNOSIS — F909 Attention-deficit hyperactivity disorder, unspecified type: Secondary | ICD-10-CM | POA: Diagnosis not present

## 2023-10-11 DIAGNOSIS — Z8639 Personal history of other endocrine, nutritional and metabolic disease: Secondary | ICD-10-CM | POA: Diagnosis not present

## 2023-10-11 DIAGNOSIS — Z1331 Encounter for screening for depression: Secondary | ICD-10-CM | POA: Diagnosis not present

## 2023-10-11 DIAGNOSIS — Z9884 Bariatric surgery status: Secondary | ICD-10-CM | POA: Diagnosis not present

## 2023-10-11 DIAGNOSIS — Z6827 Body mass index (BMI) 27.0-27.9, adult: Secondary | ICD-10-CM | POA: Diagnosis not present

## 2023-10-11 DIAGNOSIS — R7301 Impaired fasting glucose: Secondary | ICD-10-CM | POA: Diagnosis not present

## 2023-10-11 DIAGNOSIS — R632 Polyphagia: Secondary | ICD-10-CM | POA: Diagnosis not present

## 2023-10-11 DIAGNOSIS — E663 Overweight: Secondary | ICD-10-CM | POA: Diagnosis not present

## 2023-10-14 ENCOUNTER — Other Ambulatory Visit: Payer: Self-pay | Admitting: Gastroenterology

## 2023-12-08 DIAGNOSIS — D229 Melanocytic nevi, unspecified: Secondary | ICD-10-CM | POA: Diagnosis not present

## 2023-12-08 DIAGNOSIS — Z85820 Personal history of malignant melanoma of skin: Secondary | ICD-10-CM | POA: Diagnosis not present

## 2023-12-08 DIAGNOSIS — D239 Other benign neoplasm of skin, unspecified: Secondary | ICD-10-CM | POA: Diagnosis not present

## 2023-12-08 DIAGNOSIS — D225 Melanocytic nevi of trunk: Secondary | ICD-10-CM | POA: Diagnosis not present

## 2023-12-08 DIAGNOSIS — L821 Other seborrheic keratosis: Secondary | ICD-10-CM | POA: Diagnosis not present

## 2023-12-08 DIAGNOSIS — L814 Other melanin hyperpigmentation: Secondary | ICD-10-CM | POA: Diagnosis not present

## 2023-12-24 DIAGNOSIS — R051 Acute cough: Secondary | ICD-10-CM | POA: Diagnosis not present

## 2023-12-24 DIAGNOSIS — J018 Other acute sinusitis: Secondary | ICD-10-CM | POA: Diagnosis not present

## 2023-12-24 DIAGNOSIS — Z6828 Body mass index (BMI) 28.0-28.9, adult: Secondary | ICD-10-CM | POA: Diagnosis not present

## 2023-12-28 DIAGNOSIS — E663 Overweight: Secondary | ICD-10-CM | POA: Diagnosis not present

## 2023-12-28 DIAGNOSIS — K760 Fatty (change of) liver, not elsewhere classified: Secondary | ICD-10-CM | POA: Diagnosis not present

## 2023-12-28 DIAGNOSIS — Z6827 Body mass index (BMI) 27.0-27.9, adult: Secondary | ICD-10-CM | POA: Diagnosis not present

## 2023-12-28 DIAGNOSIS — Z8639 Personal history of other endocrine, nutritional and metabolic disease: Secondary | ICD-10-CM | POA: Diagnosis not present

## 2023-12-28 DIAGNOSIS — R110 Nausea: Secondary | ICD-10-CM | POA: Diagnosis not present

## 2023-12-28 DIAGNOSIS — G4709 Other insomnia: Secondary | ICD-10-CM | POA: Diagnosis not present

## 2024-01-16 ENCOUNTER — Telehealth: Payer: 59 | Admitting: Physician Assistant

## 2024-01-16 DIAGNOSIS — B999 Unspecified infectious disease: Secondary | ICD-10-CM | POA: Diagnosis not present

## 2024-01-16 DIAGNOSIS — U071 COVID-19: Secondary | ICD-10-CM | POA: Diagnosis not present

## 2024-01-16 MED ORDER — PROMETHAZINE-DM 6.25-15 MG/5ML PO SYRP: 5.0000 mL | ORAL_SOLUTION | Freq: Four times a day (QID) | ORAL | 0 refills | Status: DC | PRN

## 2024-01-16 MED ORDER — DOXYCYCLINE HYCLATE 100 MG PO TABS: 100.0000 mg | ORAL_TABLET | Freq: Two times a day (BID) | ORAL | 0 refills | Status: DC

## 2024-01-16 NOTE — Patient Instructions (Signed)
Melissa Cohen, thank you for joining Margaretann Loveless, PA-C for today's virtual visit.  While this provider is not your primary care provider (PCP), if your PCP is located in our provider database this encounter information will be shared with them immediately following your visit.   A [facility] MyChart account gives you access to today's visit and all your visits, tests, and labs performed at [facility] " click here if you don't have a [facility] MyChart account or go to [email or web address]  Consent: (Patient) Melissa Cohen provided verbal consent for this virtual visit at the beginning of the encounter.  Current Medications:  Current Outpatient Medications:    doxycycline (VIBRA-TABS) 100 MG tablet, Take 1 tablet (100 mg total) by mouth 2 (two) times daily., Disp: 20 tablet, Rfl: 0   promethazine-dextromethorphan (PROMETHAZINE-DM) 6.25-15 MG/5ML syrup, Take 5 mLs by mouth 4 (four) times daily as needed for cough., Disp: 118 mL, Rfl: 0   acetaminophen (TYLENOL) 500 MG tablet, Take 1,000 mg by mouth every 4 (four) hours as needed for moderate pain., Disp: , Rfl:    albuterol (VENTOLIN HFA) 108 (90 Base) MCG/ACT inhaler, Inhale 2 puffs into the lungs every 6 (six) hours as needed for wheezing or shortness of breath., Disp: 8 g, Rfl: 0   B Complex-C (SUPER B COMPLEX PO), Take 1 tablet by mouth every other day., Disp: , Rfl:    Biotin 16109 MCG TABS, Take 10,000 mcg by mouth daily., Disp: , Rfl:    buPROPion (WELLBUTRIN XL) 300 MG 24 hr tablet, Take 1 tablet (300 mg total) by mouth daily., Disp: 90 tablet, Rfl: 1   celecoxib (CELEBREX) 200 MG capsule, Take 1 capsule (200 mg total) by mouth 2 (two) times daily. (Patient taking differently: Take 200 mg by mouth daily.), Disp: 180 capsule, Rfl: 0   Cholecalciferol (VITAMIN D3) 125 MCG (5000 UT) CAPS, Take 1 capsule by mouth daily., Disp: , Rfl:    esomeprazole (NEXIUM) 40 MG capsule, Take 1 capsule (40 mg total) by  mouth daily., Disp: 90 capsule, Rfl: 3   famotidine (PEPCID) 40 MG tablet, TAKE ONE TABLET BY MOUTH ONE TIME DAILY (Patient taking differently: Take 40 mg by mouth daily as needed for heartburn or indigestion.), Disp: 90 tablet, Rfl: 3   gabapentin (NEURONTIN) 300 MG capsule, Take 1 capsule (300 mg total) by mouth 3 (three) times daily. (Patient not taking: Reported on 10/04/2023), Disp: 90 capsule, Rfl: 0   lidocaine (LIDODERM) 5 %, Place 1 patch onto the skin daily. Remove & Discard patch within 12 hours or as directed by MD, Disp: 30 patch, Rfl: 0   methocarbamol (ROBAXIN) 500 MG tablet, Take 2 tablets (1,000 mg total) by mouth 3 (three) times daily., Disp: 30 tablet, Rfl: 0   ondansetron (ZOFRAN) 4 MG tablet, Take 1 tablet (4 mg) by mouth as directed., Disp: 20 tablet, Rfl: 0   oxyCODONE (OXY IR/ROXICODONE) 5 MG immediate release tablet, Take 1 - 2 tablets by mouth every 6 hours as needed for moderate pain. (Patient not taking: Reported on 10/04/2023), Disp: 30 tablet, Rfl: 0   polyethylene glycol (MIRALAX / GLYCOLAX) 17 g packet, Take 17 g by mouth daily as needed for mild constipation. (Patient not taking: Reported on 10/04/2023), Disp: , Rfl:    prochlorperazine (COMPAZINE) 5 MG tablet, Take 1 tablet (5 mg total) by mouth every 6 (six) hours as needed for refractory nausea / vomiting. (Patient not taking: Reported on 10/04/2023), Disp: 10 tablet, Rfl: 0  RINVOQ 30 MG TB24, TAKE 1 TABLET BY MOUTH 1 TIME A DAY, Disp: 30 tablet, Rfl: 3   simethicone (MYLICON) 125 MG chewable tablet, Chew 240 mg by mouth every 6 (six) hours as needed for flatulence., Disp: , Rfl:    VICTOZA 18 MG/3ML SOPN, Inject 1.8 mg into the skin daily., Disp: , Rfl:    zolpidem (AMBIEN) 5 MG tablet, Take 5-10 mg by mouth at bedtime. Pt taking 10 mg, Disp: , Rfl:    Medications ordered in this encounter:  Meds ordered this encounter  Medications   promethazine-dextromethorphan (PROMETHAZINE-DM) 6.25-15 MG/5ML syrup    Sig: Take  5 mLs by mouth 4 (four) times daily as needed for cough.    Dispense:  118 mL    Refill:  0    Supervising Provider:   Merrilee Jansky [[id number]]   doxycycline (VIBRA-TABS) 100 MG tablet    Sig: Take 1 tablet (100 mg total) by mouth 2 (two) times daily.    Dispense:  20 tablet    Refill:  0    Supervising Provider:   Merrilee Jansky [[id number]]     *If you need refills on other medications prior to your next appointment, please contact your pharmacy*  Follow-Up: Call back or seek an in-person evaluation if the symptoms worsen or if the condition fails to improve as anticipated.  [facility] [phone]  Other Instructions  Acute Bronchitis, Adult  Acute bronchitis is sudden inflammation of the main airways (bronchi) that come off the windpipe (trachea) in the lungs. The swelling causes the airways to get smaller and make more mucus than normal. This can make it hard to breathe and can cause coughing or noisy breathing (wheezing). Acute bronchitis may last several weeks. The cough may last longer. Allergies, asthma, and exposure to smoke may make the condition worse. What are the causes? This condition can be caused by germs and by substances that irritate the lungs, including: Cold and flu viruses. The most common cause of this condition is the virus that causes the common cold. Bacteria. This is less common. Breathing in substances that irritate the lungs, including: Smoke from cigarettes and other forms of tobacco. Dust and pollen. Fumes from household cleaning products, gases, or burned fuel. Indoor or outdoor air pollution. What increases the risk? The following factors may make you more likely to develop this condition: A weak body's defense system, also called the immune system. A condition that affects your lungs and breathing, such as asthma. What are the signs or symptoms? Common symptoms of this condition include: Coughing. This may bring up clear,  yellow, or green mucus from your lungs (sputum). Wheezing. Runny or stuffy nose. Having too much mucus in your lungs (chest congestion). Shortness of breath. Aches and pains, including sore throat or chest. How is this diagnosed? This condition is usually diagnosed based on: Your symptoms and medical history. A physical exam. You may also have other tests, including tests to rule out other conditions, such as pneumonia. These tests include: A test of lung function. Test of a mucus sample to look for the presence of bacteria. Tests to check the oxygen level in your blood. Blood tests. Chest X-ray. How is this treated? Most cases of acute bronchitis clear up over time without treatment. Your health care provider may recommend: Drinking more fluids to help thin your mucus so it is easier to cough up. Taking inhaled medicine (inhaler) to improve air flow in and out of  your lungs. Using a vaporizer or a humidifier. These are machines that add water to the air to help you breathe better. Taking a medicine that thins mucus and clears congestion (expectorant). Taking a medicine that prevents or stops coughing (cough suppressant). It is not common to take an antibiotic medicine for this condition. Follow these instructions at home:  Take over-the-counter and prescription medicines only as told by your health care provider. Use an inhaler, vaporizer, or humidifier as told by your health care provider. Take two teaspoons (10 mL) of honey at bedtime to lessen coughing at night. Drink enough fluid to keep your urine pale yellow. Do not use any products that contain nicotine or tobacco. These products include cigarettes, chewing tobacco, and vaping devices, such as e-cigarettes. If you need help quitting, ask your health care provider. Get plenty of rest. Return to your normal activities as told by your health care provider. Ask your health care provider what activities are safe for you. Keep all  follow-up visits. This is important. How is this prevented? To lower your risk of getting this condition again: Wash your hands often with soap and water for at least 20 seconds. If soap and water are not available, use hand sanitizer. Avoid contact with people who have cold symptoms. Try not to touch your mouth, nose, or eyes with your hands. Avoid breathing in smoke or chemical fumes. Breathing smoke or chemical fumes will make your condition worse. Get the flu shot every year. Contact a health care provider if: Your symptoms do not improve after 2 weeks. You have trouble coughing up the mucus. Your cough keeps you awake at night. You have a fever. Get help right away if you: Cough up blood. Feel pain in your chest. Have severe shortness of breath. Faint or keep feeling like you are going to faint. Have a severe headache. Have a fever or chills that get worse. These symptoms may represent a serious problem that is an emergency. Do not wait to see if the symptoms will go away. Get medical help right away. Call your local emergency services (911 in the [place] Do not drive yourself to the hospital. Summary Acute bronchitis is inflammation of the main airways (bronchi) that come off the windpipe (trachea) in the lungs. The swelling causes the airways to get smaller and make more mucus than normal. Drinking more fluids can help thin your mucus so it is easier to cough up. Take over-the-counter and prescription medicines only as told by your health care provider. Do not use any products that contain nicotine or tobacco. These products include cigarettes, chewing tobacco, and vaping devices, such as e-cigarettes. If you need help quitting, ask your health care provider. Contact a health care provider if your symptoms do not improve after 2 weeks. This information is not intended to replace advice given to you by your health care provider. Make sure you discuss any questions you have with your  health care provider. Document Revised: 02/25/2022 Document Reviewed: 03/18/2021 Elsevier Patient Education  2024 Elsevier Inc.  COVID-19 COVID-19 is an infection caused by a virus called SARS-CoV-2. This type of virus is called a coronavirus. People with COVID-19 may: Have little to no symptoms. Have mild to moderate symptoms that affect their lungs and breathing. Get very sick. What are the causes? COVID-19 is caused by a virus. This virus may be in the air as droplets or on surfaces. It can spread from an infected person when they cough, sneeze, speak, sing, or  breathe. You may become infected if: You breathe in the infected droplets in the air. You touch an object that has the virus on it. What increases the risk? You are at risk of getting COVID-19 if you have been around someone with the infection. You may be more likely to get very sick if: You are 60 years old or older. You have certain medical conditions, such as: Heart disease. Diabetes. Chronic respiratory disease. Cancer. Pregnancy. You are immunocompromised. This means your body cannot fight infections easily. You have a disability or trouble moving, meaning you're immobile. What are the signs or symptoms? People may have different symptoms from COVID-19. The symptoms can also be mild to severe. They often show up in 5-6 days after being infected. But they can take up to 14 days to appear. Common symptoms are: Cough. Feeling tired. New loss of taste or smell. Fever. Less common symptoms are: Sore throat. Headache. Body or muscle aches. Diarrhea. A skin rash or odd-colored fingers or toes. Red or irritated eyes. Sometimes, COVID-19 does not cause symptoms. How is this diagnosed? COVID-19 can be diagnosed with tests done in the lab or at home. Fluid from your nose, mouth, or lungs will be used to check for the virus. How is this treated? Treatment for COVID-19 depends on how sick you are. Mild symptoms can be  treated at home with rest, fluids, and over-the-counter medicines. Severe symptoms may be treated in a hospital intensive care unit (ICU). If you have symptoms and are at risk of getting very sick, you may be given a medicine that fights viruses. This medicine is called an antiviral. How is this prevented? To protect yourself from COVID-19: Know your risk factors. Get vaccinated. If your body cannot fight infections easily, talk to your provider about treatment to help prevent COVID-19. Stay at least 1 meter away from others. Wear a well-fitted mask when: You can't stay at a distance from people. You're in a place with poor air flow. Try to be in open spaces with good air flow when in public. Wash your hands often or use an alcohol-based hand sanitizer. Cover your nose and mouth when coughing and sneezing. If you think you have COVID-19 or have been around someone who has it, stay home and be by yourself for 5-10 days. Where to find more information Centers for Disease Control and Prevention (CDC): TonerPromos.no World Health Organization [facility]): VisitDestination.com.br Get help right away if: You have trouble breathing or get short of breath. You have pain or pressure in your chest. You cannot speak or move any part of your body. You are confused. Your symptoms get worse. These symptoms may be an emergency. Get help right away. Call 911. Do not wait to see if the symptoms will go away. Do not drive yourself to the hospital. This information is not intended to replace advice given to you by your health care provider. Make sure you discuss any questions you have with your health care provider. Document Revised: 11/19/2022 Document Reviewed: 07/30/2022 Elsevier Patient Education  2024 Elsevier Inc.   If you have been instructed to have an in-person evaluation today at a local Urgent Care facility, please use the link below. It will take you to a list of all of our available [facility],  including address, phone number and hours of operation. Please do not delay care.  [facility]  If you or a family member do not have a primary care provider, use the  link below to schedule a visit and establish care. When you choose a [facility] primary care physician or advanced practice provider, you gain a long-term partner in health. Find a Primary Care Provider  Learn more about [facility]'s in-office and virtual care options: [facility] - Get Care Now

## 2024-01-16 NOTE — Progress Notes (Signed)
Virtual Visit Consent   Melissa Cohen, you are scheduled for a virtual visit with a [facility] provider today. Just as with appointments in the office, your consent must be obtained to participate. Your consent will be active for this visit and any virtual visit you may have with one of our providers in the next 365 days. If you have a MyChart account, a copy of this consent can be sent to you electronically.  As this is a virtual visit, video technology does not allow for your provider to perform a traditional examination. This may limit your provider's ability to fully assess your condition. If your provider identifies any concerns that need to be evaluated in person or the need to arrange testing (such as labs, EKG, etc.), we will make arrangements to do so. Although advances in technology are sophisticated, we cannot ensure that it will always work on either your end or our end. If the connection with a video visit is poor, the visit may have to be switched to a telephone visit. With either a video or telephone visit, we are not always able to ensure that we have a secure connection.  By engaging in this virtual visit, you consent to the provision of healthcare and authorize for your insurance to be billed (if applicable) for the services provided during this visit. Depending on your insurance coverage, you may receive a charge related to this service.  I need to obtain your verbal consent now. Are you willing to proceed with your visit today? Melissa Cohen has provided verbal consent on 01/16/2024 for a virtual visit (video or telephone). Margaretann Loveless, PA-C  Date: 01/16/2024 5:42 PM   Virtual Visit via Video Note   I, Margaretann Loveless, connected with  Melissa Cohen  ([id number], 1966/03/18) on 01/16/24 at  5:30 PM EST by a video-enabled telemedicine application and verified that I am speaking with the correct person using two identifiers.  Location: Patient: Virtual Visit  Location Patient: Home Provider: Virtual Visit Location Provider: Home Office   I discussed the limitations of evaluation and management by telemedicine and the availability of in person appointments. The patient expressed understanding and agreed to proceed.    History of Present Illness: Melissa Cohen is a 58 y.o. who identifies as a female who was assigned female at birth, and is being seen today for cough and congestion.  HPI: Cough This is a new problem. The current episode started in the past 7 days (over 6 days, started while on a cruise; roomate has tested positive for Covid 19, so its assumed she had/has it; Now with continued cough and congestion; She has been had negative testing). The problem has been gradually improving (all symptoms improving but has cough). The problem occurs constantly. The cough is Non-productive. Associated symptoms include chills, headaches, nasal congestion, postnasal drip and a sore throat (from drainage). Pertinent negatives include no ear congestion, ear pain, fever, myalgias or rhinorrhea. The symptoms are aggravated by lying down. Treatments tried: tylenol, mucinex DM, prednisone 50mg  picked up in [place], took 40mg  today at home, tried Albuterol and QVAR, tessalon perles. The treatment provided no relief.     Problems:  Patient Active Problem List   Diagnosis Date Noted   Seizures (HCC)    Restless leg syndrome    Hyperlipidemia    Sleep apnea    Perforation of colon as colonoscopy complication (HCC) 06/17/2023   Low back pain 05/13/2021   Adhesive capsulitis of  left shoulder 04/01/2021   Metabolic syndrome 02/08/2021   At risk for impaired metabolic function 12/09/2020   Polyphagia 11/04/2020   Mood disorder, with emotional eating 11/04/2020   At risk for activity intolerance 11/04/2020   Vasovagal near syncope 03/26/2020   Frequent falls 02/13/2020   HTN (hypertension) 02/13/2020   Systolic murmur 03/26/2017   Chronic rhinitis  03/17/2016   Menopausal syndrome (hot flushes) 08/23/2014   Screening for malignant neoplasm of cervix 02/25/2014   General medical examination 12/07/2011   Anxiety and depression 10/08/2011   Disorder of sacrum 04/12/2011   TROCHANTERIC BURSITIS, BILATERAL 04/12/2011   Benign neoplasm of skin 07/14/2010   Cervical radiculopathy 07/14/2010   GERD 12/31/2009   CARCINOMA, BASAL CELL 03/14/2008   Vitamin D deficiency 03/06/2008   HYPERTRIGLYCERIDEMIA 03/06/2008   Ulcerative colitis (HCC) 02/09/2007   INSOMNIA 02/09/2007    Allergies:  Allergies  Allergen Reactions   Tagamet [Cimetidine] Anaphylaxis   Effexor [Venlafaxine] Swelling   Mobic [Meloxicam] Swelling   Ultram [Tramadol] Swelling   Medications:  Current Outpatient Medications:    doxycycline (VIBRA-TABS) 100 MG tablet, Take 1 tablet (100 mg total) by mouth 2 (two) times daily., Disp: 20 tablet, Rfl: 0   promethazine-dextromethorphan (PROMETHAZINE-DM) 6.25-15 MG/5ML syrup, Take 5 mLs by mouth 4 (four) times daily as needed for cough., Disp: 118 mL, Rfl: 0   acetaminophen (TYLENOL) 500 MG tablet, Take 1,000 mg by mouth every 4 (four) hours as needed for moderate pain., Disp: , Rfl:    albuterol (VENTOLIN HFA) 108 (90 Base) MCG/ACT inhaler, Inhale 2 puffs into the lungs every 6 (six) hours as needed for wheezing or shortness of breath., Disp: 8 g, Rfl: 0   B Complex-C (SUPER B COMPLEX PO), Take 1 tablet by mouth every other day., Disp: , Rfl:    Biotin 13086 MCG TABS, Take 10,000 mcg by mouth daily., Disp: , Rfl:    buPROPion (WELLBUTRIN XL) 300 MG 24 hr tablet, Take 1 tablet (300 mg total) by mouth daily., Disp: 90 tablet, Rfl: 1   celecoxib (CELEBREX) 200 MG capsule, Take 1 capsule (200 mg total) by mouth 2 (two) times daily. (Patient taking differently: Take 200 mg by mouth daily.), Disp: 180 capsule, Rfl: 0   Cholecalciferol (VITAMIN D3) 125 MCG (5000 UT) CAPS, Take 1 capsule by mouth daily., Disp: , Rfl:    esomeprazole  (NEXIUM) 40 MG capsule, Take 1 capsule (40 mg total) by mouth daily., Disp: 90 capsule, Rfl: 3   famotidine (PEPCID) 40 MG tablet, TAKE ONE TABLET BY MOUTH ONE TIME DAILY (Patient taking differently: Take 40 mg by mouth daily as needed for heartburn or indigestion.), Disp: 90 tablet, Rfl: 3   gabapentin (NEURONTIN) 300 MG capsule, Take 1 capsule (300 mg total) by mouth 3 (three) times daily. (Patient not taking: Reported on 10/04/2023), Disp: 90 capsule, Rfl: 0   lidocaine (LIDODERM) 5 %, Place 1 patch onto the skin daily. Remove & Discard patch within 12 hours or as directed by MD, Disp: 30 patch, Rfl: 0   methocarbamol (ROBAXIN) 500 MG tablet, Take 2 tablets (1,000 mg total) by mouth 3 (three) times daily., Disp: 30 tablet, Rfl: 0   ondansetron (ZOFRAN) 4 MG tablet, Take 1 tablet (4 mg) by mouth as directed., Disp: 20 tablet, Rfl: 0   oxyCODONE (OXY IR/ROXICODONE) 5 MG immediate release tablet, Take 1 - 2 tablets by mouth every 6 hours as needed for moderate pain. (Patient not taking: Reported on 10/04/2023), Disp: 30 tablet, Rfl:  0   polyethylene glycol (MIRALAX / GLYCOLAX) 17 g packet, Take 17 g by mouth daily as needed for mild constipation. (Patient not taking: Reported on 10/04/2023), Disp: , Rfl:    prochlorperazine (COMPAZINE) 5 MG tablet, Take 1 tablet (5 mg total) by mouth every 6 (six) hours as needed for refractory nausea / vomiting. (Patient not taking: Reported on 10/04/2023), Disp: 10 tablet, Rfl: 0   RINVOQ 30 MG TB24, TAKE 1 TABLET BY MOUTH 1 TIME A DAY, Disp: 30 tablet, Rfl: 3   simethicone (MYLICON) 125 MG chewable tablet, Chew 240 mg by mouth every 6 (six) hours as needed for flatulence., Disp: , Rfl:    VICTOZA 18 MG/3ML SOPN, Inject 1.8 mg into the skin daily., Disp: , Rfl:    zolpidem (AMBIEN) 5 MG tablet, Take 5-10 mg by mouth at bedtime. Pt taking 10 mg, Disp: , Rfl:   Observations/Objective: Patient is well-developed, well-nourished in no acute distress.  Resting comfortably at  home.  Head is normocephalic, atraumatic.  No labored breathing.  Speech is clear and coherent with logical content.  Patient is alert and oriented at baseline.    Assessment and Plan: 1. COVID-19 (Primary) - promethazine-dextromethorphan (PROMETHAZINE-DM) 6.25-15 MG/5ML syrup; Take 5 mLs by mouth 4 (four) times daily as needed for cough.  Dispense: 118 mL; Refill: 0 - doxycycline (VIBRA-TABS) 100 MG tablet; Take 1 tablet (100 mg total) by mouth 2 (two) times daily.  Dispense: 20 tablet; Refill: 0  2. Superimposed infection - promethazine-dextromethorphan (PROMETHAZINE-DM) 6.25-15 MG/5ML syrup; Take 5 mLs by mouth 4 (four) times daily as needed for cough.  Dispense: 118 mL; Refill: 0 - doxycycline (VIBRA-TABS) 100 MG tablet; Take 1 tablet (100 mg total) by mouth 2 (two) times daily.  Dispense: 20 tablet; Refill: 0  - Suspect possible bacterial bronchitis secondary to Covid 19 in an immunocompromised patient - Doxycycline added - Promethazine DM added - Patient has Prednisone 10mg  at home for UC flares, she is taking 40mg  daily, continue for up to 5 days then taper down by 10mg  daily to every 2 days until finished - Patient has Tessalon perles, Albuterol inhaler, QVAR inhaler all okay to continue as directed - Push fluids - Steam and humidifier can help - Seek in person evaluation if not improving or symptoms worsen  Follow Up Instructions: I discussed the assessment and treatment plan with the patient. The patient was provided an opportunity to ask questions and all were answered. The patient agreed with the plan and demonstrated an understanding of the instructions.  A copy of instructions were sent to the patient via MyChart unless otherwise noted below.    The patient was advised to call back or seek an in-person evaluation if the symptoms worsen or if the condition fails to improve as anticipated.    Margaretann Loveless, PA-C

## 2024-01-20 ENCOUNTER — Other Ambulatory Visit: Payer: Self-pay

## 2024-01-20 ENCOUNTER — Ambulatory Visit
Admission: EM | Admit: 2024-01-20 | Discharge: 2024-01-20 | Disposition: A | Discharge status: Home / Self Care | Payer: 59 | Attending: Family Medicine | Admitting: Family Medicine

## 2024-01-20 DIAGNOSIS — R051 Acute cough: Secondary | ICD-10-CM

## 2024-01-20 DIAGNOSIS — U071 COVID-19: Secondary | ICD-10-CM | POA: Diagnosis not present

## 2024-01-20 LAB — POC COVID19/FLU A&B COMBO
Covid Antigen, POC: POSITIVE — AB
Influenza A Antigen, POC: NEGATIVE
Influenza B Antigen, POC: NEGATIVE

## 2024-01-20 MED ORDER — HYDROCODONE BIT-HOMATROP MBR 5-1.5 MG/5ML PO SOLN: 5.0000 mL | Freq: Every day | ORAL | 0 refills | Status: AC

## 2024-01-20 NOTE — Discharge Instructions (Addendum)
Do not take both the Promethazine DM and the Hycodan at bedtime

## 2024-01-20 NOTE — ED Triage Notes (Signed)
Pt presents with complaints of productive cough x 10 days. Pt did return from a cruise that she was on for two weeks. Roommate tested positive for COVID five days ago. Pt did have a tele-visit at the beginning of week, prescribed antibiotics and cough syrup. No relief. Pt currently rates her pain a 2/10 only when coughing, left side of abdomen.

## 2024-01-20 NOTE — ED Provider Notes (Signed)
[facility]    CSN: [id number] Arrival date & time: 01/20/24  1402      History   Chief Complaint Chief Complaint  Patient presents with   Cough    HPI Melissa Cohen is a 58 y.o. female.   The history is provided by the patient.  Cough Has had a cough for 10 days, she just returned from a cruise, her roommate tested positive for COVID.  She did a home COVID test which was negative.  She had a video visit yesterday was prescribed Promethazine DM and doxycycline.  States she needs some codeine or hydrocodone cough medicine to help her sleep. Denies fever, chills, chest pain, shortness of breath, vomiting, diarrhea, dizziness or lightheadedness.  Denies known contacts with flu.  Past Medical History:  Diagnosis Date   Allergy    Anemia    Anxiety    Arthritis    Osteo arthritis - bil knees   Asthma    as a young adult related to allergy flare   Atypical nevi 08/25/2018   1. LEFT MID BACK (MILD) - NO TX, 2. RIGHT CHEST (SEVERE) - W/S   Atypical nevi 09/14/2018   MID BACK (SEVERE) - W/S   Atypical nevi 11/13/2018   LEFT POST. NECK INCIDENTRAL MOLE (MILD)   Atypical nevi 11/27/2018   RIGHT CHEST + MARGIN   Atypical nevus 01/02/2018   LEFT UPPER BACK (SEVERE) - W/S   Atypical nevus 01/04/2019   LEFT STERNUM ( MODERATE)   Atypical nevus 01/04/2019   LEFT POST NECK INF. (SEVERE)   Atypical nevus 01/04/2019   RECURRENT MID BACK - [facility] DR. PAVLIS   Back pain    Basal cell carcinoma 06/06/2008   LEFT UPPER SHOULDER PROX. @ [facility]   Blood transfusion without reported diagnosis    from [facility] several   Chest pain    Depression    Fatty liver    Gallbladder problem    Gastric ulcer    GERD (gastroesophageal reflux disease)    Heart murmur    Hyperlipidemia    Hypertension    Iron deficiency anemia    Joint pain    Knee pain    Lower extremity edema    Melanoma (HCC)    right cleavage, posterior left shoulder   MM (malignant  melanoma of skin) (HCC) 09/14/2018   LEFT POST. NECK MIS - EXC   MM (malignant melanoma of skin) (HCC) 09/14/2018   RIGHT CHEST MIS - EXC   Obesity    Restless leg syndrome    Seizures (HCC)    2-3 d/t hypogylcermia - last seizure was 1998   Shortness of breath on exertion    Sleep apnea    sleep study showed mild sleep apnea - no c-papa needed   Ulcerative colitis    Dx at age 87; had been 30 years since her last flareup and then had a flareup recently in summer of 2017. Was then started back on Remicade and is doing well since.   Vitamin D deficiency     Patient Active Problem List   Diagnosis Date Noted   Seizures (HCC)    Restless leg syndrome    Hyperlipidemia    Sleep apnea    Perforation of colon as colonoscopy complication (HCC) 06/17/2023   Low back pain 05/13/2021   Adhesive capsulitis of left shoulder 04/01/2021   Metabolic syndrome 02/08/2021   At risk for impaired metabolic function 12/09/2020  Polyphagia 11/04/2020   Mood disorder, with emotional eating 11/04/2020   At risk for activity intolerance 11/04/2020   Vasovagal near syncope 03/26/2020   Frequent falls 02/13/2020   HTN (hypertension) 02/13/2020   Systolic murmur 03/26/2017   Chronic rhinitis 03/17/2016   Menopausal syndrome (hot flushes) 08/23/2014   Screening for malignant neoplasm of cervix 02/25/2014   General medical examination 12/07/2011   Anxiety and depression 10/08/2011   Disorder of sacrum 04/12/2011   TROCHANTERIC BURSITIS, BILATERAL 04/12/2011   Benign neoplasm of skin 07/14/2010   Cervical radiculopathy 07/14/2010   GERD 12/31/2009   CARCINOMA, BASAL CELL 03/14/2008   Vitamin D deficiency 03/06/2008   HYPERTRIGLYCERIDEMIA 03/06/2008   Ulcerative colitis (HCC) 02/09/2007   INSOMNIA 02/09/2007    Past Surgical History:  Procedure Laterality Date   APPENDECTOMY     BREAST ENHANCEMENT SURGERY  2004   CHOLECYSTECTOMY  2011   COLONOSCOPY  2019   LAPAROTOMY N/A 06/17/2023    Procedure: EXPLORATORY LAPAROTOMY, REPAIR OF COLOTOMY;  Surgeon: Romie Levee, MD;  Location: [facility];  Service: General;  Laterality: N/A;   right eye lasix     02/17/12   TONSILLECTOMY AND ADENOIDECTOMY     TUBAL LIGATION     UPPER GASTROINTESTINAL ENDOSCOPY     Uterine ablation     WISDOM TOOTH EXTRACTION      OB History     Gravida  3   Para      Term      Preterm      AB      Living  3      SAB      IAB      Ectopic      Multiple      Live Births               Home Medications    Prior to Admission medications   Medication Sig Start Date End Date Taking? Authorizing Provider  HYDROcodone bit-homatropine (HYCODAN) 5-1.5 MG/5ML syrup Take 5 mLs by mouth at bedtime for 6 days. 01/20/24 01/26/24 Yes Meliton Rattan, PA  acetaminophen (TYLENOL) 500 MG tablet Take 1,000 mg by mouth every 4 (four) hours as needed for moderate pain.    [provider]  albuterol (VENTOLIN HFA) 108 (90 Base) MCG/ACT inhaler Inhale 2 puffs into the lungs every 6 (six) hours as needed for wheezing or shortness of breath. 01/03/23   Sheliah Hatch, MD  B Complex-C (SUPER B COMPLEX PO) Take 1 tablet by mouth every other day.    [provider]  Biotin 46962 MCG TABS Take 10,000 mcg by mouth daily.    [provider]  buPROPion (WELLBUTRIN XL) 300 MG 24 hr tablet Take 1 tablet (300 mg total) by mouth daily. 12/06/22   Sheliah Hatch, MD  celecoxib (CELEBREX) 200 MG capsule Take 1 capsule (200 mg total) by mouth 2 (two) times daily. Patient taking differently: Take 200 mg by mouth daily. 12/06/22   Sheliah Hatch, MD  Cholecalciferol (VITAMIN D3) 125 MCG (5000 UT) CAPS Take 1 capsule by mouth daily.    [provider]  doxycycline (VIBRA-TABS) 100 MG tablet Take 1 tablet (100 mg total) by mouth 2 (two) times daily. 01/16/24   Margaretann Loveless, PA-C  esomeprazole (NEXIUM) 40 MG capsule Take 1 capsule (40 mg total) by mouth daily. 03/15/23    Meryl Dare, MD  famotidine (PEPCID) 40 MG tablet TAKE ONE TABLET BY MOUTH ONE TIME DAILY Patient  taking differently: Take 40 mg by mouth daily as needed for heartburn or indigestion. 02/25/22   Meryl Dare, MD  gabapentin (NEURONTIN) 300 MG capsule Take 1 capsule (300 mg total) by mouth 3 (three) times daily. Patient not taking: Reported on 10/04/2023 06/21/23   Juliet Rude, PA-C  lidocaine (LIDODERM) 5 % Place 1 patch onto the skin daily. Remove & Discard patch within 12 hours or as directed by MD 06/22/23   Juliet Rude, PA-C  methocarbamol (ROBAXIN) 500 MG tablet Take 2 tablets (1,000 mg total) by mouth 3 (three) times daily. 06/22/23   Juliet Rude, PA-C  ondansetron (ZOFRAN) 4 MG tablet Take 1 tablet (4 mg) by mouth as directed. 06/21/23   Juliet Rude, PA-C  oxyCODONE (OXY IR/ROXICODONE) 5 MG immediate release tablet Take 1 - 2 tablets by mouth every 6 hours as needed for moderate pain. Patient not taking: Reported on 10/04/2023 06/21/23   Juliet Rude, PA-C  polyethylene glycol (MIRALAX / GLYCOLAX) 17 g packet Take 17 g by mouth daily as needed for mild constipation. Patient not taking: Reported on 10/04/2023 06/21/23   Juliet Rude, PA-C  prochlorperazine (COMPAZINE) 5 MG tablet Take 1 tablet (5 mg total) by mouth every 6 (six) hours as needed for refractory nausea / vomiting. Patient not taking: Reported on 10/04/2023 06/21/23   Juliet Rude, PA-C  promethazine-dextromethorphan (PROMETHAZINE-DM) 6.25-15 MG/5ML syrup Take 5 mLs by mouth 4 (four) times daily as needed for cough. 01/16/24   Margaretann Loveless, PA-C  RINVOQ 30 MG TB24 TAKE 1 TABLET BY MOUTH 1 TIME A DAY 10/14/23   Meryl Dare, MD  simethicone (MYLICON) 125 MG chewable tablet Chew 240 mg by mouth every 6 (six) hours as needed for flatulence.    Sheliah Hatch, MD  VICTOZA 18 MG/3ML SOPN Inject 1.8 mg into the skin daily.    [provider]  zolpidem (AMBIEN) 5 MG tablet Take 5-10  mg by mouth at bedtime. Pt taking 10 mg 04/18/23   [provider]    Family History Family History  Problem Relation Age of Onset   Prostate cancer Father    Hyperlipidemia Father    Barrett's esophagus Father    Cancer Father        prostate   Colon cancer Father 73       appendical adenocarcinoma 2011   Hypertension Father    Diabetes Sister    Diabetes Other        Grandparents   Heart attack Maternal Grandfather    Heart disease Maternal Grandfather    Colitis Maternal Grandfather    Ulcerative colitis Mother    Hypertension Mother    Hyperlipidemia Mother    Depression Mother    Colon polyps Mother    Ulcerative colitis Maternal Grandmother    Crohn's disease Brother    Esophageal cancer Neg Hx    Rectal cancer Neg Hx    Stomach cancer Neg Hx    Allergic rhinitis Neg Hx    Angioedema Neg Hx    Asthma Neg Hx    Atopy Neg Hx    Eczema Neg Hx    Immunodeficiency Neg Hx     Social History Social History   Tobacco Use   Smoking status: Former    Current packs/day: 0.00    Average packs/day: 0.5 packs/day for 25.0 years (12.5 ttl pk-yrs)    Types: Cigarettes    Start date: 02/27/1981  Quit date: 02/27/2006    Years since quitting: 17.9   Smokeless tobacco: Never  Vaping Use   Vaping status: Never Used  Substance Use Topics   Alcohol use: Yes    Alcohol/week: 3.0 standard drinks of alcohol    Types: 3 Glasses of wine per week    Comment: every other weekend socially   Drug use: No     Allergies   Tagamet [cimetidine], Effexor [venlafaxine], Mobic [meloxicam], and Ultram [tramadol]   Review of Systems Review of Systems  Respiratory:  Positive for cough.      Physical Exam Triage Vital Signs ED Triage Vitals  Encounter Vitals Group     BP 01/20/24 1430 (!) 134/94     Systolic BP Percentile --      Diastolic BP Percentile --      Pulse Rate 01/20/24 1430 87     Resp 01/20/24 1430 20     Temp 01/20/24 1430 99 F (37.2 C)     Temp  Source 01/20/24 1430 Oral     SpO2 01/20/24 1430 95 %     Weight 01/20/24 1428 157 lb (71.2 kg)     Height 01/20/24 1428 5\' 2"  (1.575 m)     Head Circumference --      Peak Flow --      Pain Score 01/20/24 1428 2     Pain Loc --      Pain Education --      Exclude from Growth Chart --    No data found.  Updated Vital Signs BP (!) 134/94 (BP Location: Right Arm)   Pulse 87   Temp 99 F (37.2 C) (Oral)   Resp 20   Ht 5\' 2"  (1.575 m)   Wt 157 lb (71.2 kg)   LMP 09/02/2013   SpO2 95%   BMI 28.72 kg/m   Visual Acuity Right Eye Distance:   Left Eye Distance:   Bilateral Distance:    Right Eye Near:   Left Eye Near:    Bilateral Near:     Physical Exam Vitals and nursing note reviewed.  Constitutional:      Appearance: She is not ill-appearing.  HENT:     Head: Normocephalic and atraumatic.     Right Ear: Tympanic membrane and ear canal normal.     Left Ear: Tympanic membrane normal.     Mouth/Throat:     Mouth: Mucous membranes are moist.  Eyes:     General:        Right eye: No discharge.        Left eye: No discharge.     Conjunctiva/sclera: Conjunctivae normal.  Cardiovascular:     Rate and Rhythm: Normal rate and regular rhythm.     Heart sounds: Normal heart sounds.  Pulmonary:     Effort: Pulmonary effort is normal. No respiratory distress.     Breath sounds: Normal breath sounds. No wheezing, rhonchi or rales.  Musculoskeletal:     Cervical back: Neck supple.  Lymphadenopathy:     Cervical: No cervical adenopathy.  Skin:    General: Skin is warm and dry.  Neurological:     Mental Status: She is alert and oriented to person, place, and time.  Psychiatric:        Mood and Affect: Mood normal.      UC Treatments / Results  Labs (all labs ordered are listed, but only abnormal results are displayed) Labs Reviewed  POC COVID19/FLU A&B COMBO - Abnormal; Notable  for the following components:      Result Value   Covid Antigen, POC Positive (*)     All other components within normal limits    EKG   Radiology No results found.  Procedures Procedures (including critical care time)  Medications Ordered in UC Medications - No data to display  Initial Impression / Assessment and Plan / UC Course  I have reviewed the triage vital signs and the nursing notes.  Pertinent labs & imaging results that were available during my care of the patient were reviewed by me and considered in my medical decision making (see chart for details).     58 year old just returned from a cruise with positive COVID exposure has had a cough for 10 days, she is already on Promethazine DM and doxycycline prescribed by another prescriber.  She is requesting cough medicine to help her sleep. She is well-appearing, nontoxic, lungs are clear to auscultation.  Her point-of-care flu is negative, her point-of-care COVID is positive.  Rx Hycodan sent to pharmacy for nighttime use, she was cautioned not to use both the medicine DM and Hycodan at bedtime Final Clinical Impressions(s) / UC Diagnoses   Final diagnoses:  Acute cough  COVID-19     Discharge Instructions      Do not take both the Promethazine DM and the Hycodan at bedtime   ED Prescriptions     Medication Sig Dispense Auth. Provider   HYDROcodone bit-homatropine (HYCODAN) 5-1.5 MG/5ML syrup Take 5 mLs by mouth at bedtime for 6 days. 30 mL Meliton Rattan, [place]      PDMP not reviewed this encounter.   Meliton Rattan, [place] 01/20/24 1500

## 2024-01-23 ENCOUNTER — Telehealth: Payer: Self-pay

## 2024-01-23 NOTE — Telephone Encounter (Signed)
 Copied from CRM [id number]. Topic: Clinical - Medical Advice >> Jan 20, 2024  3:56 PM Almira Coaster wrote: Reason for CRM: Patient was seen at an urgent care and diagnosed with Covid, patient has a really bad cough and was prescribed HYDROcodone bit-homatropine (HYCODAN) 5-1.5 MG/5ML syrup. They only gave her one ounce and told to take it at bed time, she would like to know if there is anything else she can take throughout the day to help with her cough.

## 2024-01-23 NOTE — Telephone Encounter (Signed)
 Pt was advised to call PCP for advise.

## 2024-01-26 DIAGNOSIS — E782 Mixed hyperlipidemia: Secondary | ICD-10-CM | POA: Diagnosis not present

## 2024-01-26 DIAGNOSIS — Z6827 Body mass index (BMI) 27.0-27.9, adult: Secondary | ICD-10-CM | POA: Diagnosis not present

## 2024-01-26 DIAGNOSIS — U071 COVID-19: Secondary | ICD-10-CM | POA: Diagnosis not present

## 2024-01-26 DIAGNOSIS — Z8639 Personal history of other endocrine, nutritional and metabolic disease: Secondary | ICD-10-CM | POA: Diagnosis not present

## 2024-01-26 DIAGNOSIS — K760 Fatty (change of) liver, not elsewhere classified: Secondary | ICD-10-CM | POA: Diagnosis not present

## 2024-01-26 DIAGNOSIS — E663 Overweight: Secondary | ICD-10-CM | POA: Diagnosis not present

## 2024-02-10 ENCOUNTER — Ambulatory Visit
Admission: EM | Admit: 2024-02-10 | Discharge: 2024-02-10 | Disposition: A | Discharge status: Home / Self Care | Attending: Family Medicine | Admitting: Family Medicine

## 2024-02-10 DIAGNOSIS — B9689 Other specified bacterial agents as the cause of diseases classified elsewhere: Secondary | ICD-10-CM

## 2024-02-10 DIAGNOSIS — J069 Acute upper respiratory infection, unspecified: Secondary | ICD-10-CM

## 2024-02-10 DIAGNOSIS — D849 Immunodeficiency, unspecified: Secondary | ICD-10-CM

## 2024-02-10 DIAGNOSIS — J310 Chronic rhinitis: Secondary | ICD-10-CM

## 2024-02-10 LAB — POCT RAPID STREP A (OFFICE): Rapid Strep A Screen: NEGATIVE

## 2024-02-10 LAB — POC COVID19/FLU A&B COMBO
Covid Antigen, POC: NEGATIVE
Influenza A Antigen, POC: NEGATIVE
Influenza B Antigen, POC: NEGATIVE

## 2024-02-10 MED ORDER — AMOXICILLIN-POT CLAVULANATE 875-125 MG PO TABS: 1.0000 | ORAL_TABLET | Freq: Two times a day (BID) | ORAL | 0 refills | Status: DC

## 2024-02-10 MED ORDER — PREDNISONE 20 MG PO TABS: ORAL_TABLET | ORAL | 0 refills | Status: DC

## 2024-02-10 NOTE — Discharge Instructions (Signed)
 Start prednisone and amoxicillin-clavulanate to address an ongoing bacterial upper respiratory infection and sinus inflammation.

## 2024-02-10 NOTE — ED Provider Notes (Signed)
 [facility]  Note:  This document was prepared using Conservation officer, historic buildings and may include unintentional dictation errors.  MRN: [id number] DOB: 01-31-1966  Subjective:   Melissa Cohen is a 58 y.o. female presenting for 1+ month history of persistent intermittent sinus congestion, sinus drainage, lymph node swelling, coughing, throat pain.  She was diagnosed with COVID in February.  Has a history of chronic rhinitis, recurrent rhinitis.  She did take a Z-Pak in January.  She was also prescribed doxycycline which she finished about 2 weeks ago.  Has not undergone a steroid course or amoxicillin.  No overt shortness of breath or wheezing.  Would like testing to be done today as patient undergoes immunosuppression.  Has previously responded well to steroids.  No smoking of any kind including cigarettes, cigars, vaping, marijuana use.    No current facility-administered medications for this encounter.  Current Outpatient Medications:    acetaminophen (TYLENOL) 500 MG tablet, Take 1,000 mg by mouth every 4 (four) hours as needed for moderate pain., Disp: , Rfl:    albuterol (VENTOLIN HFA) 108 (90 Base) MCG/ACT inhaler, Inhale 2 puffs into the lungs every 6 (six) hours as needed for wheezing or shortness of breath., Disp: 8 g, Rfl: 0   B Complex-C (SUPER B COMPLEX PO), Take 1 tablet by mouth every other day., Disp: , Rfl:    Biotin 78295 MCG TABS, Take 10,000 mcg by mouth daily., Disp: , Rfl:    buPROPion (WELLBUTRIN XL) 300 MG 24 hr tablet, Take 1 tablet (300 mg total) by mouth daily., Disp: 90 tablet, Rfl: 1   celecoxib (CELEBREX) 200 MG capsule, Take 1 capsule (200 mg total) by mouth 2 (two) times daily. (Patient taking differently: Take 200 mg by mouth daily.), Disp: 180 capsule, Rfl: 0   Cholecalciferol (VITAMIN D3) 125 MCG (5000 UT) CAPS, Take 1 capsule by mouth daily., Disp: , Rfl:    doxycycline (VIBRA-TABS) 100 MG tablet, Take 1 tablet (100 mg total)  by mouth 2 (two) times daily., Disp: 20 tablet, Rfl: 0   esomeprazole (NEXIUM) 40 MG capsule, Take 1 capsule (40 mg total) by mouth daily., Disp: 90 capsule, Rfl: 3   famotidine (PEPCID) 40 MG tablet, TAKE ONE TABLET BY MOUTH ONE TIME DAILY (Patient taking differently: Take 40 mg by mouth daily as needed for heartburn or indigestion.), Disp: 90 tablet, Rfl: 3   gabapentin (NEURONTIN) 300 MG capsule, Take 1 capsule (300 mg total) by mouth 3 (three) times daily. (Patient not taking: Reported on 10/04/2023), Disp: 90 capsule, Rfl: 0   lidocaine (LIDODERM) 5 %, Place 1 patch onto the skin daily. Remove & Discard patch within 12 hours or as directed by MD, Disp: 30 patch, Rfl: 0   methocarbamol (ROBAXIN) 500 MG tablet, Take 2 tablets (1,000 mg total) by mouth 3 (three) times daily., Disp: 30 tablet, Rfl: 0   ondansetron (ZOFRAN) 4 MG tablet, Take 1 tablet (4 mg) by mouth as directed., Disp: 20 tablet, Rfl: 0   oxyCODONE (OXY IR/ROXICODONE) 5 MG immediate release tablet, Take 1 - 2 tablets by mouth every 6 hours as needed for moderate pain. (Patient not taking: Reported on 10/04/2023), Disp: 30 tablet, Rfl: 0   polyethylene glycol (MIRALAX / GLYCOLAX) 17 g packet, Take 17 g by mouth daily as needed for mild constipation. (Patient not taking: Reported on 10/04/2023), Disp: , Rfl:    prochlorperazine (COMPAZINE) 5 MG tablet, Take 1 tablet (5 mg total) by mouth every  6 (six) hours as needed for refractory nausea / vomiting. (Patient not taking: Reported on 10/04/2023), Disp: 10 tablet, Rfl: 0   promethazine-dextromethorphan (PROMETHAZINE-DM) 6.25-15 MG/5ML syrup, Take 5 mLs by mouth 4 (four) times daily as needed for cough., Disp: 118 mL, Rfl: 0   RINVOQ 30 MG TB24, TAKE 1 TABLET BY MOUTH 1 TIME A DAY, Disp: 30 tablet, Rfl: 3   simethicone (MYLICON) 125 MG chewable tablet, Chew 240 mg by mouth every 6 (six) hours as needed for flatulence., Disp: , Rfl:    VICTOZA 18 MG/3ML SOPN, Inject 1.8 mg into the skin daily.,  Disp: , Rfl:    zolpidem (AMBIEN) 5 MG tablet, Take 5-10 mg by mouth at bedtime. Pt taking 10 mg, Disp: , Rfl:    Allergies  Allergen Reactions   Tagamet [Cimetidine] Anaphylaxis   Effexor [Venlafaxine] Swelling   Mobic [Meloxicam] Swelling   Ultram [Tramadol] Swelling    Past Medical History:  Diagnosis Date   Allergy    Anemia    Anxiety    Arthritis    Osteo arthritis - bil knees   Asthma    as a young adult related to allergy flare   Atypical nevi 08/25/2018   1. LEFT MID BACK (MILD) - NO TX, 2. RIGHT CHEST (SEVERE) - W/S   Atypical nevi 09/14/2018   MID BACK (SEVERE) - W/S   Atypical nevi 11/13/2018   LEFT POST. NECK INCIDENTRAL MOLE (MILD)   Atypical nevi 11/27/2018   RIGHT CHEST + MARGIN   Atypical nevus 01/02/2018   LEFT UPPER BACK (SEVERE) - W/S   Atypical nevus 01/04/2019   LEFT STERNUM ( MODERATE)   Atypical nevus 01/04/2019   LEFT POST NECK INF. (SEVERE)   Atypical nevus 01/04/2019   RECURRENT MID BACK - [facility] DR. PAVLIS   Back pain    Basal cell carcinoma 06/06/2008   LEFT UPPER SHOULDER PROX. @ [facility]   Blood transfusion without reported diagnosis    from [facility] several   Chest pain    Depression    Fatty liver    Gallbladder problem    Gastric ulcer    GERD (gastroesophageal reflux disease)    Heart murmur    Hyperlipidemia    Hypertension    Iron deficiency anemia    Joint pain    Knee pain    Lower extremity edema    Melanoma (HCC)    right cleavage, posterior left shoulder   MM (malignant melanoma of skin) (HCC) 09/14/2018   LEFT POST. NECK MIS - EXC   MM (malignant melanoma of skin) (HCC) 09/14/2018   RIGHT CHEST MIS - EXC   Obesity    Restless leg syndrome    Seizures (HCC)    2-3 d/t hypogylcermia - last seizure was 1998   Shortness of breath on exertion    Sleep apnea    sleep study showed mild sleep apnea - no c-papa needed   Ulcerative colitis    Dx at age 24; had been 30 years since her last flareup and then  had a flareup recently in summer of 2017. Was then started back on Remicade and is doing well since.   Vitamin D deficiency      Past Surgical History:  Procedure Laterality Date   APPENDECTOMY     BREAST ENHANCEMENT SURGERY  2004   CHOLECYSTECTOMY  2011   COLONOSCOPY  2019   LAPAROTOMY N/A 06/17/2023   Procedure: EXPLORATORY LAPAROTOMY, REPAIR OF  COLOTOMY;  Surgeon: Romie Levee, MD;  Location: [facility];  Service: General;  Laterality: N/A;   right eye lasix     02/17/12   TONSILLECTOMY AND ADENOIDECTOMY     TUBAL LIGATION     UPPER GASTROINTESTINAL ENDOSCOPY     Uterine ablation     WISDOM TOOTH EXTRACTION      Family History  Problem Relation Age of Onset   Prostate cancer Father    Hyperlipidemia Father    Barrett's esophagus Father    Cancer Father        prostate   Colon cancer Father 65       appendical adenocarcinoma 2011   Hypertension Father    Diabetes Sister    Diabetes Other        Grandparents   Heart attack Maternal Grandfather    Heart disease Maternal Grandfather    Colitis Maternal Grandfather    Ulcerative colitis Mother    Hypertension Mother    Hyperlipidemia Mother    Depression Mother    Colon polyps Mother    Ulcerative colitis Maternal Grandmother    Crohn's disease Brother    Esophageal cancer Neg Hx    Rectal cancer Neg Hx    Stomach cancer Neg Hx    Allergic rhinitis Neg Hx    Angioedema Neg Hx    Asthma Neg Hx    Atopy Neg Hx    Eczema Neg Hx    Immunodeficiency Neg Hx     Social History   Tobacco Use   Smoking status: Former    Current packs/day: 0.00    Average packs/day: 0.5 packs/day for 25.0 years (12.5 ttl pk-yrs)    Types: Cigarettes    Start date: 02/27/1981    Quit date: 02/27/2006    Years since quitting: 17.9   Smokeless tobacco: Never  Vaping Use   Vaping status: Never Used  Substance Use Topics   Alcohol use: Yes    Alcohol/week: 3.0 standard drinks of alcohol    Types: 3 Glasses of wine per week    Comment:  every other weekend socially   Drug use: No    ROS   Objective:   Vitals: BP 118/81 (BP Location: Left Arm)   Pulse 94   Temp 99.1 F (37.3 C) (Oral)   Resp 16   LMP 09/02/2013   SpO2 98%   Physical Exam Constitutional:      General: She is not in acute distress.    Appearance: Normal appearance. She is well-developed and normal weight. She is not ill-appearing, toxic-appearing or diaphoretic.  HENT:     Head: Normocephalic and atraumatic.     Right Ear: Tympanic membrane, ear canal and external ear normal. No drainage, swelling or tenderness. No middle ear effusion. There is no impacted cerumen. Tympanic membrane is not erythematous or bulging.     Left Ear: Tympanic membrane, ear canal and external ear normal. No drainage, swelling or tenderness.  No middle ear effusion. There is no impacted cerumen. Tympanic membrane is not erythematous or bulging.     Nose: Congestion present. No rhinorrhea.     Mouth/Throat:     Mouth: Mucous membranes are moist. No oral lesions.     Pharynx: Posterior oropharyngeal erythema (with associated significant post-nasal drainage) present. No pharyngeal swelling, oropharyngeal exudate or uvula swelling.     Tonsils: No tonsillar exudate or tonsillar abscesses.  Eyes:     General: No scleral icterus.       Right  eye: No discharge.        Left eye: No discharge.     Extraocular Movements: Extraocular movements intact.     Right eye: Normal extraocular motion.     Left eye: Normal extraocular motion.     Conjunctiva/sclera: Conjunctivae normal.  Cardiovascular:     Rate and Rhythm: Normal rate and regular rhythm.     Heart sounds: Normal heart sounds. No murmur heard.    No friction rub. No gallop.  Pulmonary:     Effort: Pulmonary effort is normal. No respiratory distress.     Breath sounds: No stridor. No wheezing, rhonchi or rales.  Chest:     Chest wall: No tenderness.  Musculoskeletal:     Cervical back: Normal range of motion and  neck supple.  Lymphadenopathy:     Cervical: No cervical adenopathy.  Skin:    General: Skin is warm and dry.  Neurological:     General: No focal deficit present.     Mental Status: She is alert and oriented to person, place, and time.  Psychiatric:        Mood and Affect: Mood normal.        Behavior: Behavior normal.    Results for orders placed or performed during the hospital encounter of 02/10/24 (from the past 24 hours)  POCT rapid strep A     Status: Normal   Collection Time: 02/10/24  5:09 PM  Result Value Ref Range   Rapid Strep A Screen Negative   POC Covid19/Flu A&B Antigen     Status: Normal   Collection Time: 02/10/24  5:20 PM  Result Value Ref Range   Influenza A Antigen, POC Negative    Influenza B Antigen, POC Negative    Covid Antigen, POC Negative      Assessment and Plan :   PDMP not reviewed this encounter.  1. Bacterial upper respiratory infection   2. Immunosuppression (HCC)   3. Chronic rhinitis    Will address for acute on chronic rhinitis with secondary bacterial upper respiratory infection using Augmentin and prednisone.  Deferred imaging given clear cardiopulmonary exam, hemodynamically stable vital signs. Counseled patient on potential for adverse effects with medications prescribed/recommended today, ER and return-to-clinic precautions discussed, patient verbalized understanding.    Wallis Bamberg, [place] 02/10/24 [id number]

## 2024-02-10 NOTE — ED Triage Notes (Signed)
 Pt reports sore throat x 3 days; cough "A little bit" since 01/20/24 when she had COVID.

## 2024-02-13 ENCOUNTER — Telehealth: Payer: Self-pay

## 2024-02-13 NOTE — Telephone Encounter (Signed)
 Patient with UC last seen 10/04/23 by Dr Claudette Head needing refill on Rinvoq ER 30 mg. She will be due follow up in May. Is it okay to refill under your name?

## 2024-02-14 ENCOUNTER — Other Ambulatory Visit: Payer: Self-pay

## 2024-02-14 MED ORDER — RINVOQ 30 MG PO TB24
30.0000 mg | ORAL_TABLET | Freq: Every day | ORAL | 3 refills | Status: DC
Start: 2024-02-14 — End: 2024-06-12

## 2024-02-14 MED ORDER — ESOMEPRAZOLE MAGNESIUM 40 MG PO CPDR: 40.0000 mg | DELAYED_RELEASE_CAPSULE | Freq: Every day | ORAL | 0 refills | Status: DC

## 2024-02-14 NOTE — Telephone Encounter (Signed)
 Spoke with the patient. Appointment scheduled for follow up. Needing refill of esomeprazole 40 mg. Last filled 02/2023 by Dr Russella Dar. Refilled for 90 days to cover patient until her appointment.

## 2024-02-15 ENCOUNTER — Telehealth: Payer: Self-pay | Admitting: Pharmacy Technician

## 2024-02-15 ENCOUNTER — Other Ambulatory Visit (HOSPITAL_COMMUNITY): Payer: Self-pay

## 2024-02-15 NOTE — Telephone Encounter (Signed)
 Pharmacy Patient Advocate Encounter   Received notification from CoverMyMeds that prior authorization for [facility] 30MG  is required/requested.   Insurance verification completed.   The patient is insured through [employer] .   Per test claim: PA required; PA submitted to above mentioned insurance via CoverMyMeds Key/confirmation #/EOC [id number] Status is pending

## 2024-02-28 NOTE — Progress Notes (Unsigned)
 [facility] [address] [place] [address] Dept: [phone] Dept Fax: [phone]  New Patient Office Visit  Subjective:   Melissa Cohen Jun 06, 1966 03/01/2024  No chief complaint on file.   HPI: Melissa Cohen presents today to establish care at [facility]. Introduced to Publishing rights manager role and practice setting.  All questions answered.  Concerns: See below   Discussed the use of AI scribe software for clinical note transcription with the patient, who gave verbal consent to proceed.  History of Present Illness      The following portions of the patient's history were reviewed and updated as appropriate: past medical history, past surgical history, family history, social history, allergies, medications, and problem list.   Patient Active Problem List   Diagnosis Date Noted   Seizures (HCC)    Restless leg syndrome    Hyperlipidemia    Sleep apnea    Perforation of colon as colonoscopy complication (HCC) 06/17/2023   Low back pain 05/13/2021   Adhesive capsulitis of left shoulder 04/01/2021   Metabolic syndrome 02/08/2021   At risk for impaired metabolic function 12/09/2020   Polyphagia 11/04/2020   Mood disorder, with emotional eating 11/04/2020   At risk for activity intolerance 11/04/2020   Vasovagal near syncope 03/26/2020   Frequent falls 02/13/2020   HTN (hypertension) 02/13/2020   Systolic murmur 03/26/2017   Chronic rhinitis 03/17/2016   Menopausal syndrome (hot flushes) 08/23/2014   Screening for malignant neoplasm of cervix 02/25/2014   General medical examination 12/07/2011   Anxiety and depression 10/08/2011   Disorder of sacrum 04/12/2011   TROCHANTERIC BURSITIS, BILATERAL 04/12/2011   Benign neoplasm of skin 07/14/2010   Cervical radiculopathy 07/14/2010   GERD 12/31/2009   CARCINOMA, BASAL CELL 03/14/2008   Vitamin D deficiency 03/06/2008    HYPERTRIGLYCERIDEMIA 03/06/2008   Ulcerative colitis (HCC) 02/09/2007   INSOMNIA 02/09/2007   Past Medical History:  Diagnosis Date   Allergy    Anemia    Anxiety    Arthritis    Osteo arthritis - bil knees   Asthma    as a young adult related to allergy flare   Atypical nevi 08/25/2018   1. LEFT MID BACK (MILD) - NO TX, 2. RIGHT CHEST (SEVERE) - W/S   Atypical nevi 09/14/2018   MID BACK (SEVERE) - W/S   Atypical nevi 11/13/2018   LEFT POST. NECK INCIDENTRAL MOLE (MILD)   Atypical nevi 11/27/2018   RIGHT CHEST + MARGIN   Atypical nevus 01/02/2018   LEFT UPPER BACK (SEVERE) - W/S   Atypical nevus 01/04/2019   LEFT STERNUM ( MODERATE)   Atypical nevus 01/04/2019   LEFT POST NECK INF. (SEVERE)   Atypical nevus 01/04/2019   RECURRENT MID BACK - [facility] DR. PAVLIS   Back pain    Basal cell carcinoma 06/06/2008   LEFT UPPER SHOULDER PROX. @ [facility]   Blood transfusion without reported diagnosis    from [facility] several   Chest pain    Depression    Fatty liver    Gallbladder problem    Gastric ulcer    GERD (gastroesophageal reflux disease)    Heart murmur    Hyperlipidemia    Hypertension    Iron deficiency anemia    Joint pain    Knee pain    Lower extremity edema    Melanoma (HCC)    right cleavage, posterior left shoulder   MM (  malignant melanoma of skin) (HCC) 09/14/2018   LEFT POST. NECK MIS - EXC   MM (malignant melanoma of skin) (HCC) 09/14/2018   RIGHT CHEST MIS - EXC   Obesity    Restless leg syndrome    Seizures (HCC)    2-3 d/t hypogylcermia - last seizure was 1998   Shortness of breath on exertion    Sleep apnea    sleep study showed mild sleep apnea - no c-papa needed   Ulcerative colitis    Dx at age 21; had been 30 years since her last flareup and then had a flareup recently in summer of 2017. Was then started back on Remicade and is doing well since.   Vitamin D deficiency    Past Surgical History:  Procedure Laterality Date    APPENDECTOMY     BREAST ENHANCEMENT SURGERY  2004   CHOLECYSTECTOMY  2011   COLONOSCOPY  2019   LAPAROTOMY N/A 06/17/2023   Procedure: EXPLORATORY LAPAROTOMY, REPAIR OF COLOTOMY;  Surgeon: Romie Levee, MD;  Location: [facility];  Service: General;  Laterality: N/A;   right eye lasix     02/17/12   TONSILLECTOMY AND ADENOIDECTOMY     TUBAL LIGATION     UPPER GASTROINTESTINAL ENDOSCOPY     Uterine ablation     WISDOM TOOTH EXTRACTION     Family History  Problem Relation Age of Onset   Prostate cancer Father    Hyperlipidemia Father    Barrett's esophagus Father    Cancer Father        prostate   Colon cancer Father 65       appendical adenocarcinoma 2011   Hypertension Father    Diabetes Sister    Diabetes Other        Grandparents   Heart attack Maternal Grandfather    Heart disease Maternal Grandfather    Colitis Maternal Grandfather    Ulcerative colitis Mother    Hypertension Mother    Hyperlipidemia Mother    Depression Mother    Colon polyps Mother    Ulcerative colitis Maternal Grandmother    Crohn's disease Brother    Esophageal cancer Neg Hx    Rectal cancer Neg Hx    Stomach cancer Neg Hx    Allergic rhinitis Neg Hx    Angioedema Neg Hx    Asthma Neg Hx    Atopy Neg Hx    Eczema Neg Hx    Immunodeficiency Neg Hx     Current Outpatient Medications:    acetaminophen (TYLENOL) 500 MG tablet, Take 1,000 mg by mouth every 4 (four) hours as needed for moderate pain., Disp: , Rfl:    albuterol (VENTOLIN HFA) 108 (90 Base) MCG/ACT inhaler, Inhale 2 puffs into the lungs every 6 (six) hours as needed for wheezing or shortness of breath., Disp: 8 g, Rfl: 0   amoxicillin-clavulanate (AUGMENTIN) 875-125 MG tablet, Take 1 tablet by mouth 2 (two) times daily., Disp: 20 tablet, Rfl: 0   B Complex-C (SUPER B COMPLEX PO), Take 1 tablet by mouth every other day., Disp: , Rfl:    Biotin 25956 MCG TABS, Take 10,000 mcg by mouth daily., Disp: , Rfl:    buPROPion (WELLBUTRIN XL)  300 MG 24 hr tablet, Take 1 tablet (300 mg total) by mouth daily., Disp: 90 tablet, Rfl: 1   celecoxib (CELEBREX) 200 MG capsule, Take 1 capsule (200 mg total) by mouth 2 (two) times daily. (Patient taking differently: Take 200 mg by mouth daily.), Disp: 180  capsule, Rfl: 0   Cholecalciferol (VITAMIN D3) 125 MCG (5000 UT) CAPS, Take 1 capsule by mouth daily., Disp: , Rfl:    esomeprazole (NEXIUM) 40 MG capsule, Take 1 capsule (40 mg total) by mouth daily., Disp: 90 capsule, Rfl: 0   famotidine (PEPCID) 40 MG tablet, TAKE ONE TABLET BY MOUTH ONE TIME DAILY (Patient taking differently: Take 40 mg by mouth daily as needed for heartburn or indigestion.), Disp: 90 tablet, Rfl: 3   gabapentin (NEURONTIN) 300 MG capsule, Take 1 capsule (300 mg total) by mouth 3 (three) times daily. (Patient not taking: Reported on 10/04/2023), Disp: 90 capsule, Rfl: 0   lidocaine (LIDODERM) 5 %, Place 1 patch onto the skin daily. Remove & Discard patch within 12 hours or as directed by MD, Disp: 30 patch, Rfl: 0   methocarbamol (ROBAXIN) 500 MG tablet, Take 2 tablets (1,000 mg total) by mouth 3 (three) times daily., Disp: 30 tablet, Rfl: 0   ondansetron (ZOFRAN) 4 MG tablet, Take 1 tablet (4 mg) by mouth as directed., Disp: 20 tablet, Rfl: 0   oxyCODONE (OXY IR/ROXICODONE) 5 MG immediate release tablet, Take 1 - 2 tablets by mouth every 6 hours as needed for moderate pain. (Patient not taking: Reported on 10/04/2023), Disp: 30 tablet, Rfl: 0   polyethylene glycol (MIRALAX / GLYCOLAX) 17 g packet, Take 17 g by mouth daily as needed for mild constipation. (Patient not taking: Reported on 10/04/2023), Disp: , Rfl:    predniSONE (DELTASONE) 20 MG tablet, Take 2 tablets daily with breakfast., Disp: 10 tablet, Rfl: 0   prochlorperazine (COMPAZINE) 5 MG tablet, Take 1 tablet (5 mg total) by mouth every 6 (six) hours as needed for refractory nausea / vomiting. (Patient not taking: Reported on 10/04/2023), Disp: 10 tablet, Rfl: 0    promethazine-dextromethorphan (PROMETHAZINE-DM) 6.25-15 MG/5ML syrup, Take 5 mLs by mouth 4 (four) times daily as needed for cough., Disp: 118 mL, Rfl: 0   simethicone (MYLICON) 125 MG chewable tablet, Chew 240 mg by mouth every 6 (six) hours as needed for flatulence., Disp: , Rfl:    Upadacitinib ER (RINVOQ) 30 MG TB24, Take 1 tablet (30 mg total) by mouth daily., Disp: 30 tablet, Rfl: 3   VICTOZA 18 MG/3ML SOPN, Inject 1.8 mg into the skin daily., Disp: , Rfl:    zolpidem (AMBIEN) 5 MG tablet, Take 5-10 mg by mouth at bedtime. Pt taking 10 mg, Disp: , Rfl:  Allergies  Allergen Reactions   Tagamet [Cimetidine] Anaphylaxis   Effexor [Venlafaxine] Swelling   Mobic [Meloxicam] Swelling   Ultram [Tramadol] Swelling    ROS: A complete ROS was performed with pertinent positives/negatives noted in the HPI. The remainder of the ROS are negative.   Objective:   There were no vitals filed for this visit.  GENERAL: Well-appearing, in NAD. Well nourished.  SKIN: Pink, warm and dry. No rash, lesion, ulceration, or ecchymoses.  NECK: Trachea midline. Full ROM w/o pain or tenderness. No lymphadenopathy.  RESPIRATORY: Chest wall symmetrical. Respirations even and non-labored. Breath sounds clear to auscultation bilaterally.  CARDIAC: S1, S2 present, regular rate and rhythm. Peripheral pulses 2+ bilaterally.  MSK: Muscle tone and strength appropriate for age. Joints w/o tenderness, redness, or swelling.  EXTREMITIES: Without clubbing, cyanosis, or edema.  NEUROLOGIC: No motor or sensory deficits. Steady, even gait.  PSYCH/MENTAL STATUS: Alert, oriented x 3. Cooperative, appropriate mood and affect.   Health Maintenance Due  Topic Date Due   Pneumococcal Vaccine 41-24 Years old (2 of 2 -  PPSV23 or PCV20) 09/05/2018   Cervical Cancer Screening (HPV/Pap Cotest)  07/26/2022   DTaP/Tdap/Td (3 - Tdap) 09/29/2023    No results found for any visits on 03/01/24.  Assessment & Plan:  Assessment and  Plan Assessment & Plan       There are no diagnoses linked to this encounter.  No orders of the defined types were placed in this encounter.  No orders of the defined types were placed in this encounter.   No follow-ups on file.   Salvatore Decent, FNP

## 2024-03-01 ENCOUNTER — Encounter: Payer: Self-pay | Admitting: Internal Medicine

## 2024-03-01 ENCOUNTER — Ambulatory Visit (INDEPENDENT_AMBULATORY_CARE_PROVIDER_SITE_OTHER): Payer: 59 | Admitting: Internal Medicine

## 2024-03-01 VITALS — BP 114/72 | HR 75 | Temp 97.6°F | Ht 62.0 in | Wt 163.0 lb

## 2024-03-01 DIAGNOSIS — I10 Essential (primary) hypertension: Secondary | ICD-10-CM

## 2024-03-01 DIAGNOSIS — F32A Depression, unspecified: Secondary | ICD-10-CM | POA: Diagnosis not present

## 2024-03-01 DIAGNOSIS — K219 Gastro-esophageal reflux disease without esophagitis: Secondary | ICD-10-CM | POA: Diagnosis not present

## 2024-03-01 DIAGNOSIS — K51019 Ulcerative (chronic) pancolitis with unspecified complications: Secondary | ICD-10-CM | POA: Diagnosis not present

## 2024-03-01 DIAGNOSIS — E663 Overweight: Secondary | ICD-10-CM

## 2024-03-01 DIAGNOSIS — F419 Anxiety disorder, unspecified: Secondary | ICD-10-CM

## 2024-03-01 DIAGNOSIS — E785 Hyperlipidemia, unspecified: Secondary | ICD-10-CM | POA: Diagnosis not present

## 2024-03-01 DIAGNOSIS — E559 Vitamin D deficiency, unspecified: Secondary | ICD-10-CM

## 2024-03-01 LAB — LIPID PANEL
Cholesterol: 255 mg/dL — ABNORMAL HIGH (ref 0–200)
HDL: 71.7 mg/dL (ref >39.00)
LDL Cholesterol: 153 mg/dL — ABNORMAL HIGH (ref 0–99)
NonHDL: 183.63
Total CHOL/HDL Ratio: 4
Triglycerides: 153 mg/dL — ABNORMAL HIGH (ref 0.0–149.0)
VLDL: 30.6 mg/dL (ref 0.0–40.0)

## 2024-03-01 LAB — CBC WITH DIFFERENTIAL/PLATELET
Basophils Absolute: 0 10*3/uL (ref 0.0–0.1)
Basophils Relative: 0.4 % (ref 0.0–3.0)
Eosinophils Absolute: 0 10*3/uL (ref 0.0–0.7)
Eosinophils Relative: 0.9 % (ref 0.0–5.0)
HCT: 40.2 % (ref 36.0–46.0)
Hemoglobin: 13.6 g/dL (ref 12.0–15.0)
Lymphocytes Relative: 37.3 % (ref 12.0–46.0)
Lymphs Abs: 2 10*3/uL (ref 0.7–4.0)
MCHC: 33.7 g/dL (ref 30.0–36.0)
MCV: 94.3 fl (ref 78.0–100.0)
Monocytes Absolute: 0.5 10*3/uL (ref 0.1–1.0)
Monocytes Relative: 9.2 % (ref 3.0–12.0)
Neutro Abs: 2.9 10*3/uL (ref 1.4–7.7)
Neutrophils Relative %: 52.2 % (ref 43.0–77.0)
Platelets: 335 10*3/uL (ref 150.0–400.0)
RBC: 4.27 Mil/uL (ref 3.87–5.11)
RDW: 14.1 % (ref 11.5–15.5)
WBC: 5.5 10*3/uL (ref 4.0–10.5)

## 2024-03-01 LAB — COMPREHENSIVE METABOLIC PANEL WITH GFR
ALT: 12 U/L (ref 0–35)
AST: 17 U/L (ref 0–37)
Albumin: 4.6 g/dL (ref 3.5–5.2)
Alkaline Phosphatase: 57 U/L (ref 39–117)
BUN: 11 mg/dL (ref 6–23)
CO2: 28 meq/L (ref 19–32)
Calcium: 9.4 mg/dL (ref 8.4–10.5)
Chloride: 106 meq/L (ref 96–112)
Creatinine, Ser: 0.89 mg/dL (ref 0.40–1.20)
GFR: 70.84 mL/min (ref >60.00)
Glucose, Bld: 92 mg/dL (ref 70–99)
Potassium: 3.9 meq/L (ref 3.5–5.1)
Sodium: 141 meq/L (ref 135–145)
Total Bilirubin: 0.5 mg/dL (ref 0.2–1.2)
Total Protein: 6.9 g/dL (ref 6.0–8.3)

## 2024-03-01 LAB — TSH: TSH: 1.5 u[IU]/mL (ref 0.35–5.50)

## 2024-03-01 LAB — VITAMIN D 25 HYDROXY (VIT D DEFICIENCY, FRACTURES): VITD: 60.44 ng/mL (ref 30.00–100.00)

## 2024-03-05 ENCOUNTER — Encounter: Payer: Self-pay | Admitting: Internal Medicine

## 2024-03-09 DIAGNOSIS — R632 Polyphagia: Secondary | ICD-10-CM | POA: Diagnosis not present

## 2024-03-09 DIAGNOSIS — U099 Post covid-19 condition, unspecified: Secondary | ICD-10-CM | POA: Diagnosis not present

## 2024-03-09 DIAGNOSIS — E782 Mixed hyperlipidemia: Secondary | ICD-10-CM | POA: Diagnosis not present

## 2024-03-09 DIAGNOSIS — Z8639 Personal history of other endocrine, nutritional and metabolic disease: Secondary | ICD-10-CM | POA: Diagnosis not present

## 2024-03-09 DIAGNOSIS — G43809 Other migraine, not intractable, without status migrainosus: Secondary | ICD-10-CM | POA: Diagnosis not present

## 2024-03-09 DIAGNOSIS — F5101 Primary insomnia: Secondary | ICD-10-CM | POA: Diagnosis not present

## 2024-03-09 DIAGNOSIS — E663 Overweight: Secondary | ICD-10-CM | POA: Diagnosis not present

## 2024-03-09 DIAGNOSIS — F329 Major depressive disorder, single episode, unspecified: Secondary | ICD-10-CM | POA: Diagnosis not present

## 2024-03-09 DIAGNOSIS — Z6827 Body mass index (BMI) 27.0-27.9, adult: Secondary | ICD-10-CM | POA: Diagnosis not present

## 2024-03-12 ENCOUNTER — Ambulatory Visit: Payer: Self-pay

## 2024-03-12 NOTE — Telephone Encounter (Signed)
 Sent pt a [facility] message advising to go to UC for symptoms.

## 2024-03-12 NOTE — Telephone Encounter (Signed)
 Staff member at [employer] called NT to have this patient triaged. Pt has a virtual visit tomorrow for SOB on exertion and elevated HR. This RN called the patient a few times. Pt answered but the call dropped. RN called again and LVM with callback number. RN will route for more attempts.

## 2024-03-12 NOTE — Telephone Encounter (Signed)
 This RN attemped 3rd attempt to contact patient. No answer. LVM. Will route to clinic.

## 2024-03-12 NOTE — Telephone Encounter (Signed)
  Chief Complaint: SOB Symptoms: high heart rate  Disposition: [] ED /[x] Urgent Care (no appt availability in office) / [] Appointment(In office/virtual)/ []  [facility]/ [] Home Care/ [x] Refused Recommended Disposition /[] [facility]/ []  Follow-up with PCP Additional Notes: Pt called with complaints of SOB while exertion with high heart of 130-140s. Pt states resting HR was been in 90s. PT feels at times when she gets up too fast that she will pass out. Pt is currently driving/traveling to [place]  and is out of state. RN advised go to UC today and follow up with PCP once she returns next week. Pt refuses to be seen at [facility] because " she feels fine right now." RN does not  feel it is appropriate to make appt at this time for appt next week. Please advise.                  Reason for Disposition  [1] MILD difficulty breathing (e.g., minimal/no SOB at rest, SOB with walking, pulse <100) AND [2] NEW-onset or WORSE than normal  Answer Assessment - Initial Assessment Questions 1. RESPIRATORY STATUS: "Describe your breathing?" (e.g., wheezing, shortness of breath, unable to speak, severe coughing)      Can't catch breath while moving 2. ONSET: "When did this breathing problem begin?"      Over weekend  3. PATTERN "Does the difficult breathing come and go, or has it been constant since it started?"      Comes and goes 4. SEVERITY: "How bad is your breathing?" (e.g., mild, moderate, severe)    - MILD: No SOB at rest, mild SOB with walking, speaks normally in sentences, can lie down, no retractions, pulse < 100.    - MODERATE: SOB at rest, SOB with minimal exertion and prefers to sit, cannot lie down flat, speaks in phrases, mild retractions, audible wheezing, pulse 100-120.    - SEVERE: Very SOB at rest, speaks in single words, struggling to breathe, sitting hunched forward, retractions, pulse > 120      moderate 5. RECURRENT SYMPTOM: "Have you had difficulty breathing  before?" If Yes, ask: "When was the last time?" and "What happened that time?"      no 6. CARDIAC HISTORY: "Do you have any history of heart disease?" (e.g., heart attack, angina, bypass surgery, angioplasty)      Heart murmur  7. LUNG HISTORY: "Do you have any history of lung disease?"  (e.g., pulmonary embolus, asthma, emphysema)     Some wheezing after being sick 8. CAUSE: "What do you think is causing the breathing problem?"      Not sure  9. OTHER SYMPTOMS: "Do you have any other symptoms? (e.g., dizziness, runny nose, cough, chest pain, fever)     High heart rate 130-140 10. O2 SATURATION MONITOR:  "Do you use an oxygen saturation monitor (pulse oximeter) at home?" If Yes, ask: "What is your reading (oxygen level) today?" "What is your usual oxygen saturation reading?" (e.g., 95%)       na  Protocols used: Breathing Difficulty-A-AH

## 2024-03-12 NOTE — Telephone Encounter (Signed)
 This RN made a second attempt to contact the patient. Patient did not answer, RN LVM with a callback number.

## 2024-03-12 NOTE — Telephone Encounter (Signed)
 Agree with NT recommendations to go to UC for evaluation.

## 2024-03-12 NOTE — Telephone Encounter (Signed)
 Provider aware.  I agree, the patient needs to be seen at the nearest urgent care for evaluation based upon her current symptoms.

## 2024-03-13 ENCOUNTER — Telehealth: Admitting: Internal Medicine

## 2024-03-20 ENCOUNTER — Other Ambulatory Visit: Payer: Self-pay | Admitting: *Deleted

## 2024-03-20 ENCOUNTER — Other Ambulatory Visit: Payer: Self-pay | Admitting: Internal Medicine

## 2024-03-20 DIAGNOSIS — F39 Unspecified mood [affective] disorder: Secondary | ICD-10-CM

## 2024-03-20 DIAGNOSIS — M79641 Pain in right hand: Secondary | ICD-10-CM

## 2024-03-20 NOTE — Telephone Encounter (Signed)
 Last Ov 03/01/24 Filled by historical provider

## 2024-03-20 NOTE — Telephone Encounter (Signed)
 Copied from CRM [id number]. Topic: Clinical - Prescription Issue >> Mar 20, 2024  2:06 PM Chuck Crater wrote: Reason for CRM: Tyra Galley with [facility] is needing prescriptions on  zolpidem  (AMBIEN ) 10 MG tablet, celecoxib  (CELEBREX ) 200 MG capsule,  esomeprazole  (NEXIUM ) 40 MG capsule, and buPROPion  (WELLBUTRIN  XL) 300 MG 24 hr tablet.

## 2024-03-21 MED ORDER — CELECOXIB 200 MG PO CAPS: 200.0000 mg | ORAL_CAPSULE | Freq: Every day | ORAL | 1 refills | Status: DC

## 2024-03-21 MED ORDER — ESOMEPRAZOLE MAGNESIUM 40 MG PO CPDR: 40.0000 mg | DELAYED_RELEASE_CAPSULE | Freq: Every day | ORAL | 1 refills | Status: DC

## 2024-03-21 MED ORDER — BUPROPION HCL ER (XL) 300 MG PO TB24: 300.0000 mg | ORAL_TABLET | Freq: Every day | ORAL | 1 refills | Status: DC

## 2024-03-22 ENCOUNTER — Encounter: Payer: Self-pay | Admitting: Pediatrics

## 2024-03-22 ENCOUNTER — Telehealth: Payer: Self-pay

## 2024-03-22 ENCOUNTER — Ambulatory Visit (HOSPITAL_BASED_OUTPATIENT_CLINIC_OR_DEPARTMENT_OTHER)
Admission: RE | Admit: 2024-03-22 | Discharge: 2024-03-22 | Disposition: A | Discharge status: Home / Self Care | Source: Ambulatory Visit | Attending: Internal Medicine | Admitting: Internal Medicine

## 2024-03-22 ENCOUNTER — Other Ambulatory Visit (HOSPITAL_COMMUNITY): Payer: Self-pay

## 2024-03-22 ENCOUNTER — Ambulatory Visit (INDEPENDENT_AMBULATORY_CARE_PROVIDER_SITE_OTHER): Admitting: Internal Medicine

## 2024-03-22 ENCOUNTER — Encounter: Payer: Self-pay | Admitting: Internal Medicine

## 2024-03-22 VITALS — BP 98/70 | HR 78 | Temp 98.0°F | Ht 62.0 in | Wt 162.8 lb

## 2024-03-22 DIAGNOSIS — R0609 Other forms of dyspnea: Secondary | ICD-10-CM | POA: Insufficient documentation

## 2024-03-22 DIAGNOSIS — R209 Unspecified disturbances of skin sensation: Secondary | ICD-10-CM

## 2024-03-22 DIAGNOSIS — R0602 Shortness of breath: Secondary | ICD-10-CM | POA: Diagnosis not present

## 2024-03-22 NOTE — Progress Notes (Unsigned)
 [facility] [address] [place] [address] Dept: [phone] Dept Fax: [phone]  Acute Care Office Visit  Subjective:   Melissa Cohen 10/17/1966 03/22/2024  Chief Complaint  Patient presents with   Shortness of Breath    Random SOB and increase heart rate noticed 2 weeks ago     HPI: Discussed the use of AI scribe software for clinical note transcription with the patient, who gave verbal consent to proceed.  History of Present Illness   Melissa Cohen is a 58 year old female with a systolic murmur who presents with exertional shortness of breath.  She experiences shortness of breath upon exertion, particularly when climbing stairs in her two-story house. This has occurred about four times in the week prior to her vacation two weeks ago. She describes the sensation as feeling like she has been doing 'a half an hour of aerobics' and notes her pulse increases to 135 bpm, whereas her normal resting pulse is 70-80 bpm.  During her vacation in [place] , she did not experience any episodes of shortness of breath, as she avoided stairs and used an elevator. She also experienced shortness of breath when loading her suitcase into the car, indicating exertion as a trigger.  No associated chest pain, dizziness, or syncope, although she feels lightheaded if she stands up too quickly from a lying position. No fever, cough, or unintentional weight loss.  Her past medical history includes a systolic murmur found incidentally on an echocardiogram, a history of high blood pressure which is currently well-controlled. She had a stress test in 2010 due to chest pain, which was normal. Hx of Vitamin D  def, but well controlled.   She has a history of smoking for 25 years, quitting in 2007, with a smoking history of half to three-quarters of a pack per day. She reports her feet are often cold and sometimes appear purple, but there is no  swelling or numbness.         The following portions of the patient's history were reviewed and updated as appropriate: past medical history, past surgical history, family history, social history, allergies, medications, and problem list.   Patient Active Problem List   Diagnosis Date Noted   Seizures (HCC)    Restless leg syndrome    Hyperlipidemia    Sleep apnea    Perforation of colon as colonoscopy complication (HCC) 06/17/2023   Low back pain 05/13/2021   Adhesive capsulitis of left shoulder 04/01/2021   Metabolic syndrome 02/08/2021   At risk for impaired metabolic function 12/09/2020   Polyphagia 11/04/2020   Mood disorder, with emotional eating 11/04/2020   At risk for activity intolerance 11/04/2020   Vasovagal near syncope 03/26/2020   Frequent falls 02/13/2020   HTN (hypertension) 02/13/2020   Systolic murmur 03/26/2017   Chronic rhinitis 03/17/2016   Menopausal syndrome (hot flushes) 08/23/2014   Screening for malignant neoplasm of cervix 02/25/2014   Anxiety and depression 10/08/2011   Disorder of sacrum 04/12/2011   TROCHANTERIC BURSITIS, BILATERAL 04/12/2011   Benign neoplasm of skin 07/14/2010   Cervical radiculopathy 07/14/2010   GERD 12/31/2009   CARCINOMA, BASAL CELL 03/14/2008   Vitamin D  deficiency 03/06/2008   HYPERTRIGLYCERIDEMIA 03/06/2008   Ulcerative colitis (HCC) 02/09/2007   INSOMNIA 02/09/2007   Past Medical History:  Diagnosis Date   Allergy    Anemia    Anxiety    Arthritis    Osteo arthritis - bil knees   Asthma  as a young adult related to allergy flare   Atypical nevi 08/25/2018   1. LEFT MID BACK (MILD) - NO TX, 2. RIGHT CHEST (SEVERE) - W/S   Atypical nevi 09/14/2018   MID BACK (SEVERE) - W/S   Atypical nevi 11/13/2018   LEFT POST. NECK INCIDENTRAL MOLE (MILD)   Atypical nevi 11/27/2018   RIGHT CHEST + MARGIN   Atypical nevus 01/02/2018   LEFT UPPER BACK (SEVERE) - W/S   Atypical nevus 01/04/2019   LEFT STERNUM (  MODERATE)   Atypical nevus 01/04/2019   LEFT POST NECK INF. (SEVERE)   Atypical nevus 01/04/2019   RECURRENT MID BACK - [facility] DR. PAVLIS   Back pain    Basal cell carcinoma 06/06/2008   LEFT UPPER SHOULDER PROX. @ [facility]   Blood transfusion without reported diagnosis    from [facility] several   Chest pain    Depression    Fatty liver    Gallbladder problem    Gastric ulcer    GERD (gastroesophageal reflux disease)    Heart murmur    Hyperlipidemia    Hypertension    Iron deficiency anemia    Joint pain    Knee pain    Lower extremity edema    Melanoma (HCC)    right cleavage, posterior left shoulder   MM (malignant melanoma of skin) (HCC) 09/14/2018   LEFT POST. NECK MIS - EXC   MM (malignant melanoma of skin) (HCC) 09/14/2018   RIGHT CHEST MIS - EXC   Obesity    Restless leg syndrome    Seizures (HCC)    2-3 d/t hypogylcermia - last seizure was 1998   Shortness of breath on exertion    Sleep apnea    sleep study showed mild sleep apnea - no c-papa needed   Ulcerative colitis    Dx at age 67; had been 30 years since her last flareup and then had a flareup recently in summer of 2017. Was then started back on Remicade  and is doing well since.   Vitamin D  deficiency    Past Surgical History:  Procedure Laterality Date   APPENDECTOMY     BREAST ENHANCEMENT SURGERY  2004   CHOLECYSTECTOMY  2011   COLONOSCOPY  2019   LAPAROTOMY N/A 06/17/2023   Procedure: EXPLORATORY LAPAROTOMY, REPAIR OF COLOTOMY;  Surgeon: Joyce Nixon, MD;  Location: [facility];  Service: General;  Laterality: N/A;   right eye lasix     02/17/12   TONSILLECTOMY AND ADENOIDECTOMY     TUBAL LIGATION     UPPER GASTROINTESTINAL ENDOSCOPY     Uterine ablation     WISDOM TOOTH EXTRACTION     Family History  Problem Relation Age of Onset   Prostate cancer Father    Hyperlipidemia Father    Barrett's esophagus Father    Cancer Father        prostate   Colon cancer Father 54        appendical adenocarcinoma 2011   Hypertension Father    Diabetes Sister    Diabetes Other        Grandparents   Heart attack Maternal Grandfather    Heart disease Maternal Grandfather    Colitis Maternal Grandfather    Ulcerative colitis Mother    Hypertension Mother    Hyperlipidemia Mother    Depression Mother    Colon polyps Mother    Ulcerative colitis Maternal Grandmother    Crohn's disease Brother  Esophageal cancer Neg Hx    Rectal cancer Neg Hx    Stomach cancer Neg Hx    Allergic rhinitis Neg Hx    Angioedema Neg Hx    Asthma Neg Hx    Atopy Neg Hx    Eczema Neg Hx    Immunodeficiency Neg Hx     Current Outpatient Medications:    acetaminophen  (TYLENOL ) 500 MG tablet, Take 1,000 mg by mouth every 4 (four) hours as needed for moderate pain., Disp: , Rfl:    albuterol  (VENTOLIN  HFA) 108 (90 Base) MCG/ACT inhaler, Inhale 2 puffs into the lungs every 6 (six) hours as needed for wheezing or shortness of breath., Disp: 8 g, Rfl: 0   B Complex-C (SUPER B COMPLEX PO), Take 1 tablet by mouth every other day., Disp: , Rfl:    Biotin 16109 MCG TABS, Take 10,000 mcg by mouth daily., Disp: , Rfl:    buPROPion  (WELLBUTRIN  XL) 300 MG 24 hr tablet, Take 1 tablet (300 mg total) by mouth daily., Disp: 90 tablet, Rfl: 1   celecoxib  (CELEBREX ) 200 MG capsule, Take 1 capsule (200 mg total) by mouth daily., Disp: 90 capsule, Rfl: 1   Cholecalciferol (VITAMIN D3) 125 MCG (5000 UT) CAPS, Take 1 capsule by mouth daily., Disp: , Rfl:    esomeprazole  (NEXIUM ) 40 MG capsule, Take 1 capsule (40 mg total) by mouth daily., Disp: 90 capsule, Rfl: 1   famotidine  (PEPCID ) 40 MG tablet, TAKE ONE TABLET BY MOUTH ONE TIME DAILY (Patient taking differently: Take 40 mg by mouth daily as needed for heartburn or indigestion.), Disp: 90 tablet, Rfl: 3   gabapentin  (NEURONTIN ) 300 MG capsule, Take 1 capsule (300 mg total) by mouth 3 (three) times daily. (Patient taking differently: Take 300 mg by mouth daily.),  Disp: 90 capsule, Rfl: 0   ondansetron  (ZOFRAN ) 4 MG tablet, Take 1 tablet (4 mg) by mouth as directed., Disp: 20 tablet, Rfl: 0   QULIPTA 60 MG TABS, daily., Disp: , Rfl:    simethicone  (MYLICON) 125 MG chewable tablet, Chew 240 mg by mouth every 6 (six) hours as needed for flatulence., Disp: , Rfl:    Upadacitinib  ER (RINVOQ ) 30 MG TB24, Take 1 tablet (30 mg total) by mouth daily., Disp: 30 tablet, Rfl: 3   VICTOZA  18 MG/3ML SOPN, Inject 1.8 mg into the skin daily., Disp: , Rfl:    zolpidem  (AMBIEN ) 10 MG tablet, TAKE 1 TABLET BY MOUTH ONCE DAILY AT BEDTIME. AS NEEDED INSOMNIA 30 DAYS, Disp: 30 tablet, Rfl: 1   amoxicillin -clavulanate (AUGMENTIN ) 875-125 MG tablet, Take 1 tablet by mouth 2 (two) times daily., Disp: 20 tablet, Rfl: 0 Allergies  Allergen Reactions   Tagamet [Cimetidine] Anaphylaxis   Effexor  [Venlafaxine ] Swelling   Mobic [Meloxicam] Swelling   Ultram  [Tramadol ] Swelling     ROS: A complete ROS was performed with pertinent positives/negatives noted in the HPI. The remainder of the ROS are negative.    Objective:   Today's Vitals   03/22/24 1342  BP: 98/70  Pulse: 78  Temp: 98 F (36.7 C)  TempSrc: Temporal  SpO2: 99%  Weight: 162 lb 12.8 oz (73.8 kg)  Height: 5\' 2"  (1.575 m)    GENERAL: Well-appearing, in NAD. Well nourished.  SKIN: Pink, warm and dry. No rash, lesion, ulceration, or ecchymoses.  NECK: Trachea midline. Full ROM w/o pain or tenderness. No lymphadenopathy.  RESPIRATORY: Chest wall symmetrical. Respirations even and non-labored. Breath sounds clear to auscultation bilaterally.  CARDIAC: S1, S2 present,  regular rate and rhythm. Peripheral pulses 2+ bilaterally.  MSK: Muscle tone and strength appropriate for age. Joints w/o tenderness, redness, or swelling. EXTREMITIES: Without clubbing, cyanosis, or edema.  NEUROLOGIC: No motor or sensory deficits. Steady, even gait.  PSYCH/MENTAL STATUS: Alert, oriented x 3. Cooperative, appropriate mood and  affect.   EKG RESULT: EKG tracing is personally reviewed.   EKG: {ekg findings:315101}.     No results found for any visits on 03/22/24.    Assessment & Plan:  Assessment and Plan    Exertional dyspnea Exertional dyspnea with increased heart rate during activity, no chest pain or dizziness. Differential includes cardiac or respiratory causes. Normal EKG and labs. History of systolic murmur, past echocardiogram and stress test over a decade ago. Low likelihood of pulmonary embolism. - Order chest x-ray to evaluate respiratory causes. - Order echocardiogram to assess heart function. - Consider referral to cardiology or pulmonology based on results. - Discuss potential need for stress test with cardiology if indicated.  Orthostatic hypotension Lightheadedness upon standing, suggestive of orthostatic hypotension. Blood pressure well-managed. - Advise on slow positional changes to mitigate symptoms. - Monitor blood pressure and symptoms.  Cold feet with discoloration Cold feet with occasional purple discoloration, possibly related to tobacco use. Good capillary refill observed. - Monitor for changes in symptoms or appearance. - Consider vascular studies if symptoms worsen.  Tobacco use Smoked for 25 years, quit in 2007. Smoking history relevant for exertional dyspnea evaluation. - Consider respiratory evaluation if chest x-ray or echocardiogram indicate.       No orders of the defined types were placed in this encounter.  Orders Placed This Encounter  Procedures   DG Chest 2 View    Standing Status:   Future    Number of Occurrences:   1    Expiration Date:   09/21/2024    Reason for Exam (SYMPTOM  OR DIAGNOSIS REQUIRED):   shortness of breath on exertion    Is the patient pregnant?:   No    Preferred imaging location?:   [facility]   EKG 12-Lead   ECHOCARDIOGRAM COMPLETE    Standing Status:   Future    Expiration Date:   03/22/2025    Where should this test be  performed:   [facility])    Does the patient weigh less than or greater than 250 lbs?:   Patient weighs less than 250 lbs    Perflutren DEFINITY (image enhancing agent) should be administered unless hypersensitivity or allergy exist:   Administer Perflutren    Reason for exam-Echo:   Dyspnea  R06.00   Lab Orders  No laboratory test(s) ordered today   No images are attached to the encounter or orders placed in the encounter.  No follow-ups on file.   Gavin Kast, FNP

## 2024-03-22 NOTE — Telephone Encounter (Signed)
 Pharmacy Patient Advocate Encounter   Received notification from Pt Calls Messages that prior authorization for Rinvoq  30MG  er tablets is required/requested.   Insurance verification completed.   The patient is insured through [employer] .   Per test claim: PA required; PA submitted to above mentioned insurance via CoverMyMeds Key/confirmation #/EOC [id number] Status is pending

## 2024-03-22 NOTE — Telephone Encounter (Signed)
 PA request has been Submitted. New Encounter has been or will be created for follow up. For additional info see Pharmacy Prior Auth telephone encounter from 03-23-2024.

## 2024-03-22 NOTE — Telephone Encounter (Signed)
 Inbound call from patient, states specialty pharmacy are stating prior authorization is needed for Rinvoq . States they did not receive a request.

## 2024-03-23 IMAGING — MR MR ABDOMEN WO/W CM
16 of 17 series · 44 of 48 positions shown · IV contrast (GADAVIST)
Comparison: CT chest 03/10/2022

CLINICAL DATA: Hepatic lesion on CT, for further characterization

EXAM:
MRI ABDOMEN WITHOUT AND WITH CONTRAST
TECHNIQUE: Multiplanar multisequence MR imaging of the abdomen was performed
both before and after the administration of intravenous contrast.
CONTRAST:  6.4mL GADAVIST GADOBUTROL 1 MMOL/ML IV SOLN

[Series 4: cor ssfse / · coronal · 6.0mm · 1.48mm/px · 1 of 38 slices shown]
[im 1/38]
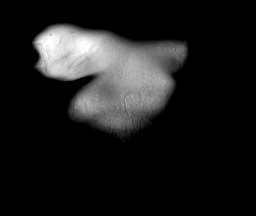

[Series 5: T2 fat-sat · axial · 7.0mm · 1.41mm/px · z∈[-127,+167]mm · 2 of 36 slices shown]
[im 1/36]
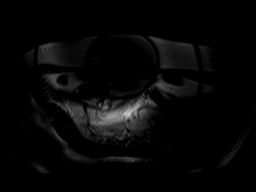
[im 36/36]
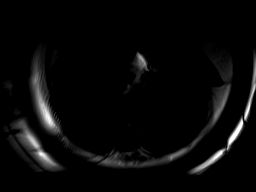

[Series 7: DWI · axial · 7.0mm · 1.88mm/px · z∈[-129,+182]mm · 5 of 112 slices shown (1 of 2)]
[im 1/112]
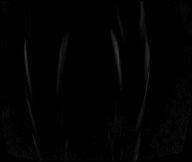
[im 28/112]
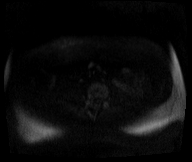
[im 56/112]
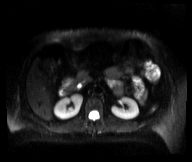
[im 84/112]
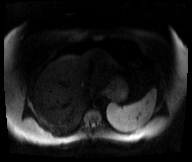
[im 112/112]
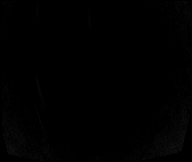

[Series 8: DWI · axial · 7.0mm · 1.88mm/px · z∈[-129,+182]mm · 2 of 38 slices shown (2 of 2)]
[im 1/38]
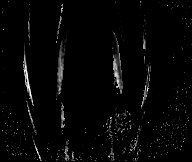
[im 38/38]
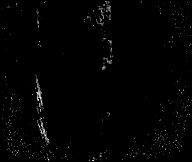

[Series 9: T1 · axial · 7.0mm · 0.70mm/px · z∈[-127,+167]mm · 3 of 72 slices shown]
[im 1/72]
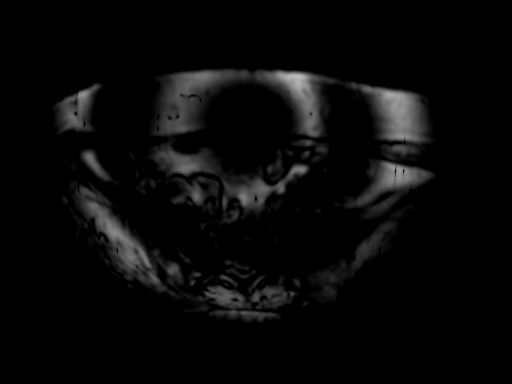
[im 36/72]
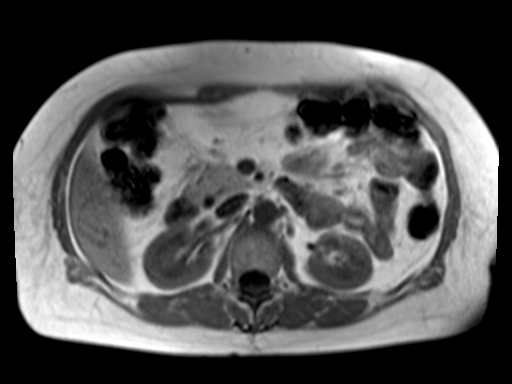
[im 72/72]
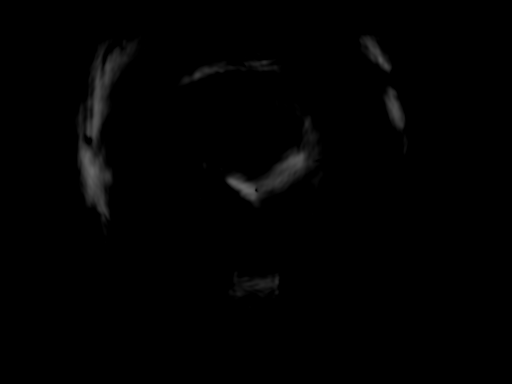

[Series 10: bSSFP · axial · 7.0mm · 0.70mm/px · z∈[-127,+167]mm · 2 of 36 slices shown]
[im 1/36]
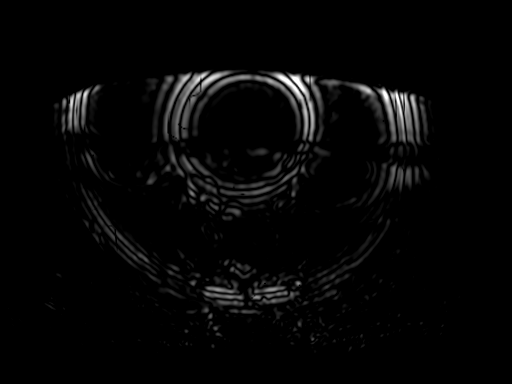
[im 36/36]
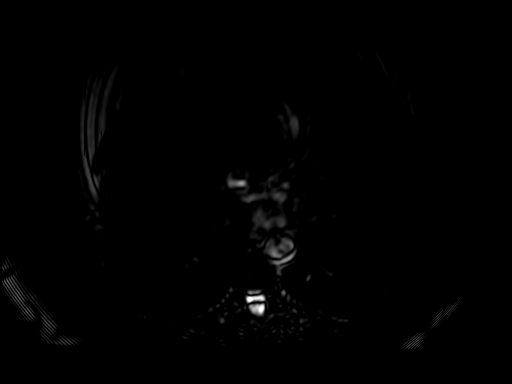

[Series 11: axial dynamic pre · axial · non-contrast · 4.0mm · 1.12mm/px · z∈[-122,+162]mm · 3 of 72 slices shown]
[im 1/72]
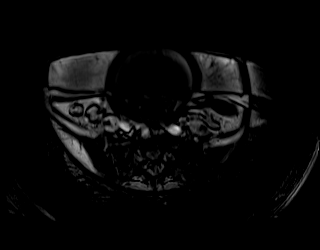
[im 36/72]
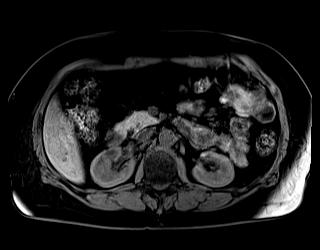
[im 72/72]
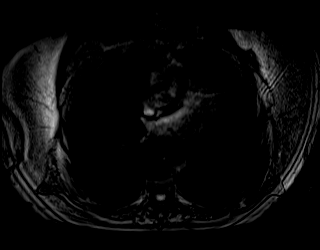

[Series 12: axial dynamic post · axial · 4.0mm · 1.12mm/px · z∈[-122,+162]mm · 3 of 72 slices shown (1 of 6)]
[im 1/72]
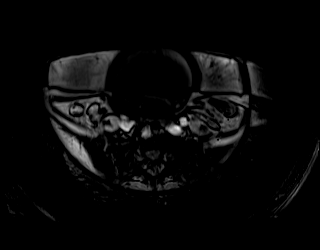
[im 36/72]
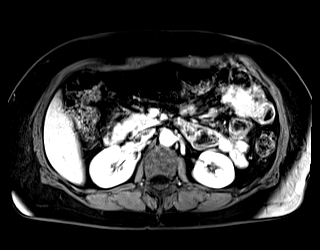
[im 72/72]
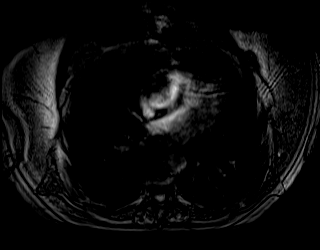

[Series 13: axial dynamic post · axial · 4.0mm · 1.12mm/px · z∈[-122,+162]mm · 3 of 72 slices shown (2 of 6)]
[im 1/72]
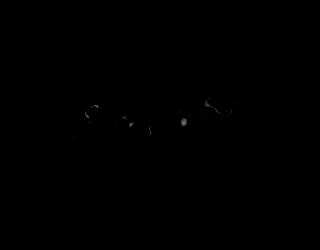
[im 36/72]
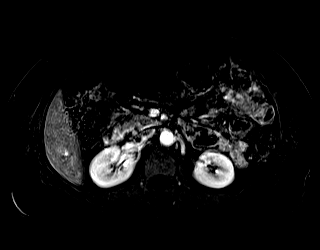
[im 72/72]
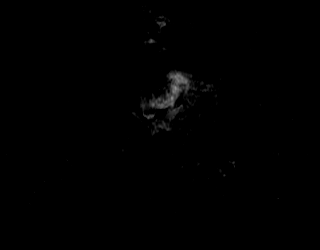

[Series 14: axial dynamic post · axial · 4.0mm · 1.12mm/px · z∈[-122,+162]mm · 3 of 72 slices shown (3 of 6)]
[im 1/72]
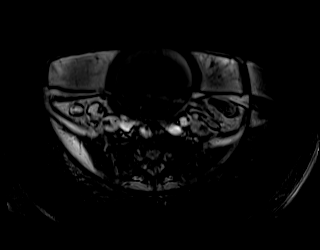
[im 36/72]
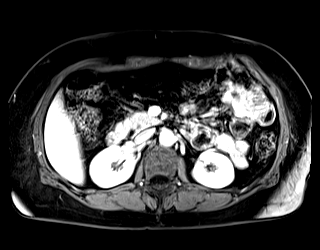
[im 72/72]
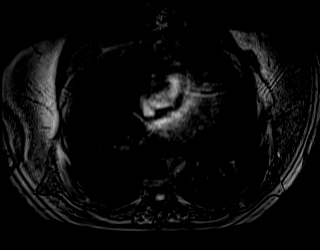

[Series 15: axial dynamic post · axial · 4.0mm · 1.12mm/px · z∈[-122,+162]mm · 3 of 72 slices shown (4 of 6)]
[im 1/72]
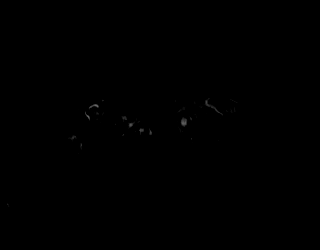
[im 36/72]
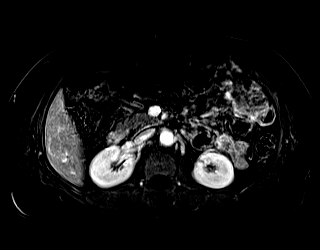
[im 72/72]
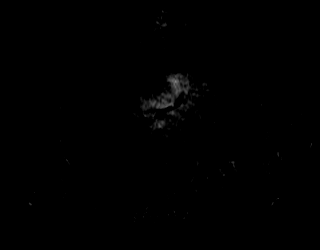

[Series 16: axial dynamic post · axial · 4.0mm · 1.12mm/px · z∈[-122,+162]mm · 3 of 72 slices shown (5 of 6)]
[im 1/72]
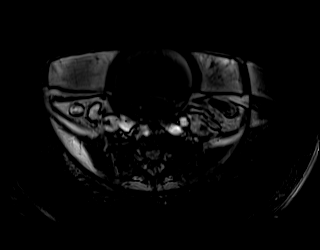
[im 36/72]
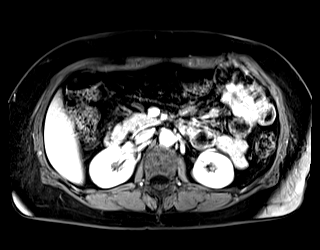
[im 72/72]
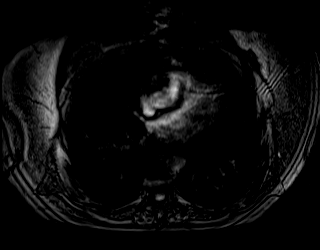

[Series 17: axial dynamic post · axial · 4.0mm · 1.12mm/px · z∈[-122,+162]mm · 3 of 72 slices shown (6 of 6)]
[im 1/72]
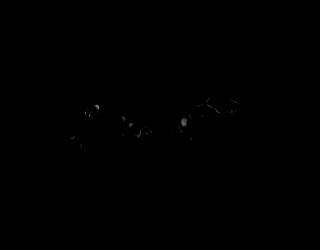
[im 36/72]
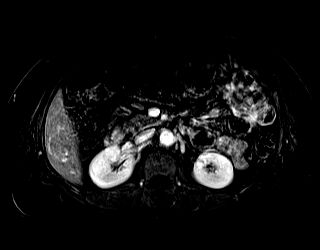
[im 72/72]
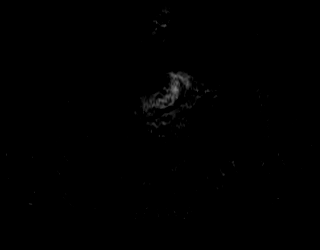

[Series 19: axial ssfse / · axial · 7.0mm · 1.12mm/px · z∈[-127,+167]mm · 2 of 36 slices shown]
[im 1/36]
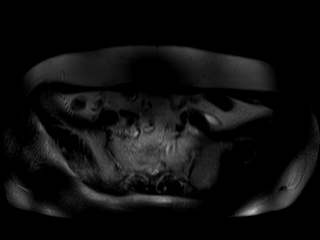
[im 36/36]
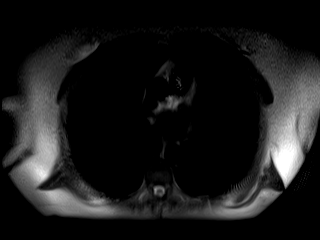

[Series 20: axial dynamic 3min · axial · 4.0mm · 1.12mm/px · z∈[-122,+162]mm · 3 of 72 slices shown (1 of 2)]
[im 1/72]
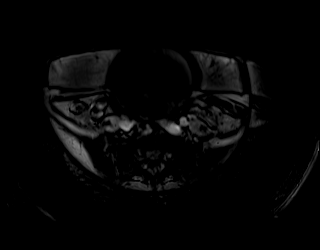
[im 36/72]
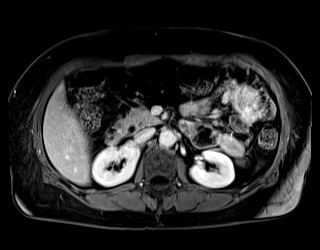
[im 72/72]
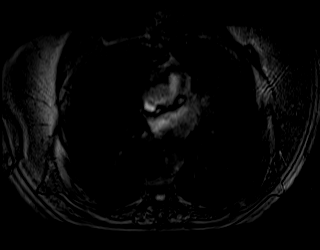

[Series 21: axial dynamic 3min · axial · 4.0mm · 1.12mm/px · z∈[-122,+162]mm · 3 of 72 slices shown (2 of 2)]
[im 1/72]
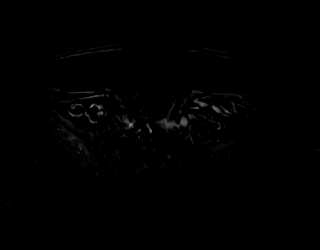
[im 36/72]
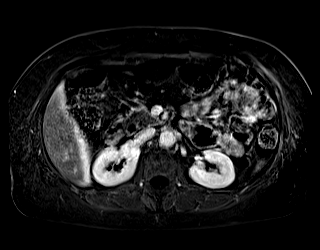
[im 72/72]
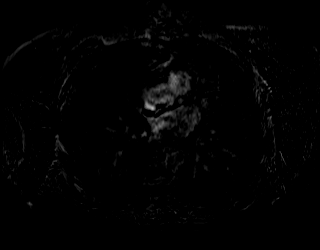

[44 of 48 positions shown; findings below may reference images not displayed]

FINDINGS: Lower chest: Collapsed bilateral breast implants.

Hepatobiliary: The 0.5 cm lesion of concern in the anterior dome of
segment 4a of the liver does not enhance and has fluid signal MRI
characteristics compatible with a simple cyst. Likewise there is a
1.1 by 0.9 cm simple cyst posteriorly in the right hepatic lobe on
image 16 series 19.

The gallbladder surgically absent. Common bile duct 8 mm in
diameter, mild prominence likely a physiologic response to
cholecystectomy.

Pancreas:  Unremarkable

Spleen:  Unremarkable

Adrenals/Urinary Tract:  Unremarkable

Stomach/Bowel: Prominent stool throughout the colon favors
constipation.

Vascular/Lymphatic:  Unremarkable

Other:  No supplemental non-categorized findings.

Musculoskeletal: Loss of disc height and degenerative endplate
findings at the L2-3 level.
IMPRESSION: 1. The hepatic lesion of concern is a 5 mm cyst. There is also an 11
mm cyst posteriorly in the right hepatic lobe. No worrisome hepatic
lesion.
2. Sir constipation
3. Degenerative disc disease at L2-3.
4. Collapsed bilateral breast implants.

## 2024-03-24 ENCOUNTER — Ambulatory Visit (INDEPENDENT_AMBULATORY_CARE_PROVIDER_SITE_OTHER)

## 2024-03-24 ENCOUNTER — Ambulatory Visit
Admission: EM | Admit: 2024-03-24 | Discharge: 2024-03-24 | Disposition: A | Discharge status: Home / Self Care | Attending: Family Medicine | Admitting: Family Medicine

## 2024-03-24 DIAGNOSIS — M5416 Radiculopathy, lumbar region: Secondary | ICD-10-CM

## 2024-03-24 DIAGNOSIS — M5116 Intervertebral disc disorders with radiculopathy, lumbar region: Secondary | ICD-10-CM | POA: Diagnosis not present

## 2024-03-24 DIAGNOSIS — M419 Scoliosis, unspecified: Secondary | ICD-10-CM | POA: Diagnosis not present

## 2024-03-24 MED ORDER — CYCLOBENZAPRINE HCL 5 MG PO TABS: 5.0000 mg | ORAL_TABLET | Freq: Every evening | ORAL | 0 refills | Status: AC | PRN

## 2024-03-24 MED ORDER — METHYLPREDNISOLONE ACETATE 80 MG/ML IJ SUSP
80.0000 mg | Freq: Once | INTRAMUSCULAR | Status: AC
  Administered 2024-03-24: 80 mg via INTRAMUSCULAR

## 2024-03-24 NOTE — ED Provider Notes (Signed)
 [facility]  Note:  This document was prepared using Conservation officer, historic buildings and may include unintentional dictation errors.  MRN: [id number] DOB: 07/22/1966  Subjective:   Melissa Cohen is a 58 y.o. female presenting for 1 day history of acute onset recurrent low back pain that radiates into the right leg.  Typically her sciatica has affected her into the thigh but unfortunately for this flareup it is affecting her throughout most of her leg.  She has previously responded well to steroids.  Would like to have a steroid injection done today.  No fall, trauma, changes to bowel or urinary habits.  No rashes.  She does see spine specialist at [facility] but unfortunately he retired.  She is in the process of trying to obtain a new back specialist.  No current facility-administered medications for this encounter.  Current Outpatient Medications:    acetaminophen  (TYLENOL ) 500 MG tablet, Take 1,000 mg by mouth every 4 (four) hours as needed for moderate pain., Disp: , Rfl:    albuterol  (VENTOLIN  HFA) 108 (90 Base) MCG/ACT inhaler, Inhale 2 puffs into the lungs every 6 (six) hours as needed for wheezing or shortness of breath., Disp: 8 g, Rfl: 0   amoxicillin -clavulanate (AUGMENTIN ) 875-125 MG tablet, Take 1 tablet by mouth 2 (two) times daily., Disp: 20 tablet, Rfl: 0   B Complex-C (SUPER B COMPLEX PO), Take 1 tablet by mouth every other day., Disp: , Rfl:    Biotin 82956 MCG TABS, Take 10,000 mcg by mouth daily., Disp: , Rfl:    buPROPion  (WELLBUTRIN  XL) 300 MG 24 hr tablet, Take 1 tablet (300 mg total) by mouth daily., Disp: 90 tablet, Rfl: 1   celecoxib  (CELEBREX ) 200 MG capsule, Take 1 capsule (200 mg total) by mouth daily., Disp: 90 capsule, Rfl: 1   Cholecalciferol (VITAMIN D3) 125 MCG (5000 UT) CAPS, Take 1 capsule by mouth daily., Disp: , Rfl:    esomeprazole  (NEXIUM ) 40 MG capsule, Take 1 capsule (40 mg total) by mouth  daily., Disp: 90 capsule, Rfl: 1   famotidine  (PEPCID ) 40 MG tablet, TAKE ONE TABLET BY MOUTH ONE TIME DAILY (Patient taking differently: Take 40 mg by mouth daily as needed for heartburn or indigestion.), Disp: 90 tablet, Rfl: 3   gabapentin  (NEURONTIN ) 300 MG capsule, Take 1 capsule (300 mg total) by mouth 3 (three) times daily. (Patient taking differently: Take 300 mg by mouth daily.), Disp: 90 capsule, Rfl: 0   ondansetron  (ZOFRAN ) 4 MG tablet, Take 1 tablet (4 mg) by mouth as directed., Disp: 20 tablet, Rfl: 0   QULIPTA 60 MG TABS, daily., Disp: , Rfl:    simethicone  (MYLICON) 125 MG chewable tablet, Chew 240 mg by mouth every 6 (six) hours as needed for flatulence., Disp: , Rfl:    Upadacitinib  ER (RINVOQ ) 30 MG TB24, Take 1 tablet (30 mg total) by mouth daily., Disp: 30 tablet, Rfl: 3   VICTOZA  18 MG/3ML SOPN, Inject 1.8 mg into the skin daily., Disp: , Rfl:    zolpidem  (AMBIEN ) 10 MG tablet, TAKE 1 TABLET BY MOUTH ONCE DAILY AT BEDTIME. AS NEEDED INSOMNIA 30 DAYS, Disp: 30 tablet, Rfl: 1   Allergies  Allergen Reactions   Tagamet [Cimetidine] Anaphylaxis   Effexor  [Venlafaxine ] Swelling   Mobic [Meloxicam] Swelling   Ultram  [Tramadol ] Swelling    Past Medical History:  Diagnosis Date   Allergy    Anemia    Anxiety    Arthritis  Osteo arthritis - bil knees   Asthma    as a young adult related to allergy flare   Atypical nevi 08/25/2018   1. LEFT MID BACK (MILD) - NO TX, 2. RIGHT CHEST (SEVERE) - W/S   Atypical nevi 09/14/2018   MID BACK (SEVERE) - W/S   Atypical nevi 11/13/2018   LEFT POST. NECK INCIDENTRAL MOLE (MILD)   Atypical nevi 11/27/2018   RIGHT CHEST + MARGIN   Atypical nevus 01/02/2018   LEFT UPPER BACK (SEVERE) - W/S   Atypical nevus 01/04/2019   LEFT STERNUM ( MODERATE)   Atypical nevus 01/04/2019   LEFT POST NECK INF. (SEVERE)   Atypical nevus 01/04/2019   RECURRENT MID BACK - [facility] PAVLIS   Back pain    Basal cell carcinoma 06/06/2008   LEFT  UPPER SHOULDER PROX. @ [facility]   Blood transfusion without reported diagnosis    from [facility] several   Chest pain    Depression    Fatty liver    Gallbladder problem    Gastric ulcer    GERD (gastroesophageal reflux disease)    Heart murmur    Hyperlipidemia    Hypertension    Iron deficiency anemia    Joint pain    Knee pain    Lower extremity edema    Melanoma (HCC)    right cleavage, posterior left shoulder   MM (malignant melanoma of skin) (HCC) 09/14/2018   LEFT POST. 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 Was then started back on Remicade  and is doing well since.   Vitamin D  deficiency      Past Surgical History:  Procedure Laterality Date   APPENDECTOMY     BREAST ENHANCEMENT SURGERY  2004   CHOLECYSTECTOMY  2011   COLONOSCOPY  2019   LAPAROTOMY N/A 06/17/2023   Procedure: EXPLORATORY LAPAROTOMY, REPAIR OF COLOTOMY;  Surgeon: Joyce Nixon, MD;  Location: [facility];  Service: General;  Laterality: N/A;   right eye lasix     02/17/12   TONSILLECTOMY AND ADENOIDECTOMY     TUBAL LIGATION     UPPER GASTROINTESTINAL ENDOSCOPY     Uterine ablation     WISDOM TOOTH EXTRACTION      Family History  Problem Relation Age of Onset   Prostate cancer Father    Hyperlipidemia Father    Barrett's esophagus Father    Cancer Father        prostate   Colon cancer Father 36       appendical adenocarcinoma 2011   Hypertension Father    Diabetes Sister    Diabetes Other        Grandparents   Heart attack Maternal Grandfather    Heart disease Maternal Grandfather    Colitis  Maternal Grandfather    Ulcerative colitis Mother    Hypertension Mother    Hyperlipidemia Mother    Depression Mother    Colon polyps Mother  Ulcerative colitis Maternal Grandmother    Crohn's disease Brother    Esophageal cancer Neg Hx    Rectal cancer Neg Hx    Stomach cancer Neg Hx    Allergic rhinitis Neg Hx    Angioedema Neg Hx    Asthma Neg Hx    Atopy Neg Hx    Eczema Neg Hx    Immunodeficiency Neg Hx     Social History   Tobacco Use   Smoking status: Former    Current packs/day: 0.00    Average packs/day: 0.5 packs/day for 25.0 years (12.5 ttl pk-yrs)    Types: Cigarettes    Start date: 02/27/1981    Quit date: 02/27/2006    Years since quitting: 18.0   Smokeless tobacco: Never  Vaping Use   Vaping status: Never Used  Substance Use Topics   Alcohol use: Yes    Alcohol/week: 3.0 standard drinks of alcohol    Types: 3 Glasses of wine per week    Comment: every other weekend socially   Drug use: No    ROS   Objective:   Vitals: BP 108/69 (BP Location: Left Arm)   Pulse 74   Temp (!) 97.4 F (36.3 C) (Oral)   Resp 16   LMP 09/02/2013   SpO2 97%   Physical Exam Constitutional:      General: She is not in acute distress.    Appearance: Normal appearance. She is well-developed. She is not ill-appearing, toxic-appearing or diaphoretic.  HENT:     Head: Normocephalic and atraumatic.     Nose: Nose normal.     Mouth/Throat:     Mouth: Mucous membranes are moist.  Eyes:     General: No scleral icterus.       Right eye: No discharge.        Left eye: No discharge.     Extraocular Movements: Extraocular movements intact.  Cardiovascular:     Rate and Rhythm: Normal rate.  Pulmonary:     Effort: Pulmonary effort is normal.  Musculoskeletal:     Lumbar back: Spasms and tenderness present. No swelling, edema, deformity, signs of trauma, lacerations or bony tenderness. Decreased range of motion. Positive right straight leg raise test. Negative left  straight leg raise test. No scoliosis.       Back:  Skin:    General: Skin is warm and dry.  Neurological:     General: No focal deficit present.     Mental Status: She is alert and oriented to person, place, and time.  Psychiatric:        Mood and Affect: Mood normal.        Behavior: Behavior normal.     IM Depo-Medrol  80 mg administered in clinic.   Assessment and Plan :   PDMP not reviewed this encounter.  1. Lumbar radiculopathy    Patient has acute on chronic radiculopathy of the lumbar region.  Recommended a steroid course.  Patient requested this in the form of an injection.  And therefore we used IM Depo-Medrol  as above.  Recommended that she recheck with the spine specialty clinic.  Counseled patient on potential for adverse effects with medications prescribed/recommended today, ER and return-to-clinic precautions discussed, patient verbalized understanding.    Adolph Hoop, PA-C 03/24/24 1031

## 2024-03-24 NOTE — ED Triage Notes (Signed)
 Pt states right lower back pain that is radiating down her right leg since yesterday.  States she has been using heat and ice.  States she took Gabapentin , Tylenol  #3 at home with some relief.

## 2024-03-25 ENCOUNTER — Telehealth: Payer: Self-pay | Admitting: Urgent Care

## 2024-03-25 MED ORDER — ACETAMINOPHEN-CODEINE 300-30 MG PO TABS: 1.0000 | ORAL_TABLET | Freq: Four times a day (QID) | ORAL | 0 refills | Status: DC | PRN

## 2024-03-25 NOTE — Telephone Encounter (Signed)
 Patient experiencing ongoing pain.  She was seen today, her symptoms are very consistent with lumbar radiculopathy.  No urinary symptoms, no bowel symptoms.  No fevers.  She did experience pain relief from Tylenol  3 but is out of this medication.  She has contacted the spine specialty clinic but unfortunately does not have an appointment set up yet.  She takes gabapentin  as one of her regular medications.  As such, I recommend maintaining strict ER precautions.  Will provide her with a prescription for Tylenol  3 and maintain plan to follow-up urgently with the spine clinic.

## 2024-03-27 ENCOUNTER — Encounter (HOSPITAL_COMMUNITY): Payer: Self-pay

## 2024-03-27 ENCOUNTER — Other Ambulatory Visit: Payer: Self-pay

## 2024-03-27 ENCOUNTER — Emergency Department (HOSPITAL_COMMUNITY)

## 2024-03-27 ENCOUNTER — Emergency Department (HOSPITAL_COMMUNITY)
Admission: EM | Admit: 2024-03-27 | Discharge: 2024-03-27 | Disposition: A | Discharge status: Home / Self Care | Attending: Emergency Medicine | Admitting: Emergency Medicine

## 2024-03-27 ENCOUNTER — Encounter: Payer: Self-pay | Admitting: Internal Medicine

## 2024-03-27 DIAGNOSIS — M5440 Lumbago with sciatica, unspecified side: Secondary | ICD-10-CM | POA: Insufficient documentation

## 2024-03-27 DIAGNOSIS — M545 Low back pain, unspecified: Secondary | ICD-10-CM | POA: Diagnosis not present

## 2024-03-27 DIAGNOSIS — M5117 Intervertebral disc disorders with radiculopathy, lumbosacral region: Secondary | ICD-10-CM | POA: Diagnosis not present

## 2024-03-27 DIAGNOSIS — M5441 Lumbago with sciatica, right side: Secondary | ICD-10-CM | POA: Diagnosis not present

## 2024-03-27 DIAGNOSIS — M5116 Intervertebral disc disorders with radiculopathy, lumbar region: Secondary | ICD-10-CM | POA: Diagnosis not present

## 2024-03-27 DIAGNOSIS — M48061 Spinal stenosis, lumbar region without neurogenic claudication: Secondary | ICD-10-CM | POA: Diagnosis not present

## 2024-03-27 DIAGNOSIS — R0602 Shortness of breath: Secondary | ICD-10-CM | POA: Diagnosis not present

## 2024-03-27 LAB — COMPREHENSIVE METABOLIC PANEL WITH GFR
ALT: 86 U/L — ABNORMAL HIGH (ref 0–44)
AST: 50 U/L — ABNORMAL HIGH (ref 15–41)
Albumin: 4.4 g/dL (ref 3.5–5.0)
Alkaline Phosphatase: 71 U/L (ref 38–126)
Anion gap: 9 (ref 5–15)
BUN: 19 mg/dL (ref 6–20)
CO2: 24 mmol/L (ref 22–32)
Calcium: 9.8 mg/dL (ref 8.9–10.3)
Chloride: 106 mmol/L (ref 98–111)
Creatinine, Ser: 0.58 mg/dL (ref 0.44–1.00)
GFR, Estimated: >60 mL/min (ref >60)
Glucose, Bld: 113 mg/dL — ABNORMAL HIGH (ref 70–99)
Potassium: 4.2 mmol/L (ref 3.5–5.1)
Sodium: 139 mmol/L (ref 135–145)
Total Bilirubin: 0.3 mg/dL (ref 0.0–1.2)
Total Protein: 7.4 g/dL (ref 6.5–8.1)

## 2024-03-27 LAB — CBC WITH DIFFERENTIAL/PLATELET
Abs Immature Granulocytes: 0.03 10*3/uL (ref 0.00–0.07)
Basophils Absolute: 0 10*3/uL (ref 0.0–0.1)
Basophils Relative: 0 %
Eosinophils Absolute: 0 10*3/uL (ref 0.0–0.5)
Eosinophils Relative: 0 %
HCT: 38.9 % (ref 36.0–46.0)
Hemoglobin: 12.7 g/dL (ref 12.0–15.0)
Immature Granulocytes: 0 %
Lymphocytes Relative: 14 %
Lymphs Abs: 1.2 10*3/uL (ref 0.7–4.0)
MCH: 30.9 pg (ref 26.0–34.0)
MCHC: 32.6 g/dL (ref 30.0–36.0)
MCV: 94.6 fL (ref 80.0–100.0)
Monocytes Absolute: 0.4 10*3/uL (ref 0.1–1.0)
Monocytes Relative: 5 %
Neutro Abs: 6.8 10*3/uL (ref 1.7–7.7)
Neutrophils Relative %: 81 %
Platelets: 371 10*3/uL (ref 150–400)
RBC: 4.11 MIL/uL (ref 3.87–5.11)
RDW: 13 % (ref 11.5–15.5)
WBC: 8.5 10*3/uL (ref 4.0–10.5)
nRBC: 0 % (ref 0.0–0.2)

## 2024-03-27 LAB — RESP PANEL BY RT-PCR (RSV, FLU A&B, COVID)  RVPGX2
Influenza A by PCR: NEGATIVE
Influenza B by PCR: NEGATIVE
Resp Syncytial Virus by PCR: NEGATIVE
SARS Coronavirus 2 by RT PCR: NEGATIVE

## 2024-03-27 MED ORDER — ACETAMINOPHEN 500 MG PO TABS: 1000.0000 mg | ORAL_TABLET | ORAL | 0 refills | Status: DC | PRN

## 2024-03-27 MED ORDER — OXYCODONE HCL 5 MG PO TABS: 5.0000 mg | ORAL_TABLET | ORAL | 0 refills | Status: DC | PRN

## 2024-03-27 MED ORDER — LIDOCAINE 5 % EX PTCH: 1.0000 | MEDICATED_PATCH | CUTANEOUS | 0 refills | Status: DC

## 2024-03-27 NOTE — ED Provider Triage Note (Signed)
 Emergency Medicine Provider Triage Evaluation Note  Melissa Cohen , a 58 y.o. female  was evaluated in triage.  Pt complains of increasing right sided back pain. History of lumbar radiculopathy. Seen at urgent care on 4/26, lumbar xray obtained, and received steroid injection with temporary improvement. Patient also complaining of shortness of breath with exertion. No recent episodes as patient has been physically limited due to back pain.  Review of Systems  Positive: Chronic back pain, shortness of breath, mild increase in right leg weakness Negative: Chest, abdominal pain, bowel or bladder incontinence  Physical Exam  BP 131/82   Pulse 95   Temp 98.7 F (37.1 C)   Resp 20   Ht 5\' 2"  (1.575 m)   Wt 73.8 kg   LMP 09/02/2013   SpO2 99%   BMI 29.76 kg/m  Gen:   Awake, no distress   Resp:  Normal effort  MSK:   Moves extremities without difficulty  Other:    Medical Decision Making  Medically screening exam initiated at 12:47 PM.  Appropriate orders placed.  Hulen Mages was informed that the remainder of the evaluation will be completed by another provider, this initial triage assessment does not replace that evaluation, and the importance of remaining in the ED until their evaluation is complete.     Gigi Kyle, NP 03/27/24 2116

## 2024-03-27 NOTE — ED Provider Notes (Signed)
 [place] EMERGENCY DEPARTMENT AT [facility] Provider Note   CSN: [id number] Arrival date & time: 03/27/24  1225     History Chief Complaint  Patient presents with   Back Pain    sciatica   Shortness of Breath    HPI Melissa Cohen is a 58 y.o. female presenting for right sided posterior leg pain x 1 week. Seen at [facility] got steroids without improvement.  Started in lower back Shot down leg but minimal improvement Recent fall on the 11th.  Patient's recorded medical, surgical, social, medication list and allergies were reviewed in the Snapshot window as part of the initial history.   Review of Systems   Review of Systems  Constitutional:  Negative for chills and fever.  HENT:  Negative for ear pain and sore throat.   Eyes:  Negative for pain and visual disturbance.  Respiratory:  Negative for cough and shortness of breath.   Cardiovascular:  Negative for chest pain and palpitations.  Gastrointestinal:  Negative for abdominal pain and vomiting.  Genitourinary:  Negative for dysuria and hematuria.  Musculoskeletal:  Positive for back pain. Negative for arthralgias.  Skin:  Negative for color change and rash.  Neurological:  Negative for seizures and syncope.  All other systems reviewed and are negative.   Physical Exam Updated Vital Signs BP 118/89   Pulse 82   Temp 98.1 F (36.7 C)   Resp 16   Ht 5\' 2"  (1.575 m)   Wt 73.8 kg   LMP 09/02/2013   SpO2 99%   BMI 29.76 kg/m  Physical Exam Vitals and nursing note reviewed.  Constitutional:      General: She is not in acute distress.    Appearance: She is well-developed.  HENT:     Head: Normocephalic and atraumatic.  Eyes:     Conjunctiva/sclera: Conjunctivae normal.  Cardiovascular:     Rate and Rhythm: Normal rate and regular rhythm.     Heart sounds: No murmur heard. Pulmonary:     Effort: Pulmonary effort is normal. No respiratory distress.     Breath sounds: Normal breath sounds.   Abdominal:     General: There is no distension.     Palpations: Abdomen is soft.     Tenderness: There is no abdominal tenderness. There is no right CVA tenderness or left CVA tenderness.  Musculoskeletal:        General: No swelling or tenderness. Normal range of motion.     Cervical back: Neck supple.  Skin:    General: Skin is warm and dry.  Neurological:     General: No focal deficit present.     Mental Status: She is alert and oriented to person, place, and time. Mental status is at baseline.     Cranial Nerves: No cranial nerve deficit.      ED Course/ Medical Decision Making/ A&P    Procedures Procedures   Medications Ordered in ED Medications - No data to display Medical Decision Making:   Melissa Cohen is a 58 y.o. female who presented to the ED today with acute lower back pain over the past 96 hours, detailed above.    Additional history discussed with patient's family/caregivers.  Patient placed on continuous vitals and telemetry monitoring while in ED which was reviewed periodically.   On my initial exam, the pt was with an intact neurologic exam, tolerating ambulation with an antalgic gait and p.o. intake without difficulty.  Patient had no abnormal DTRs, no midline  spinal tenderness.  Patient endorsing complete sensation of the perineum.  Patient without episodes of fecal or urinary incontinence.  Patient has no focal neurologic deficits and reassuring vital signs at this time.  No obvious physical abnormality or injury on exam. Notably, patient denies recent trauma, is afebrile, and denies IVDU.   Reviewed and confirmed nursing documentation for past medical history, family history, social history.    Initial Assessment:   With the patient's presentation of acute back pain in the above setting, most likely diagnosis is musculoskeletal strain. Other diagnoses were considered including (but not limited to) underlying fracture, epidural hematoma, cauda equina  syndrome, spinal stenosis, spinal malignancy. These are considered less likely due to history of present illness and physical exam findings.   In particular, lack of fever, substantial history of IV drug use, or substantial neurologic abnormality is less consistent with epidural abscess versus discitis or other spinal infection. MRI ordered per triage.  Initial Plan:  Multimodal pain control described and patient informed on safe usage.  Screening evaluation including below radiographic evaluation reviewed and grossly unremarkable at this time. Patient stable for continued outpatient evaluation and management of their musculoskeletal pains.  Patient referred back to primary care provider for continued evaluation and management.   Initial Study Results:   Radiology  MR LUMBAR SPINE WO CONTRAST  Final Result    DG Chest Port 1 View  Final Result       Disposition:   Based on the above findings, I believe patient is stable for discharge.    Patient and family educated about specific return precautions for given chief complaint and symptoms.  Patient and family educated about follow-up with PCP and Neurosurgery.  Patient and family expressed understanding of return precautions and need for follow-up. Patient spoken to regarding all imaging and laboratory results and appropriate follow up for these results. All education provided in verbal and written form and time was allowed for answering of patient questions. Patient discharged.    Emergency Department Medication Summary:   Medications - No data to display   Clinical Impression:  1. Acute right-sided low back pain with sciatica, sciatica laterality unspecified      Data Unavailable   Final Clinical Impression(s) / ED Diagnoses Final diagnoses:  Acute right-sided low back pain with sciatica, sciatica laterality unspecified    Rx / DC Orders ED Discharge Orders     None         Onetha Bile, MD 03/27/24 1743

## 2024-03-27 NOTE — ED Triage Notes (Addendum)
 Friday pt began having lower back pain that is now shooting down right thigh. Pain is aching and burning. Pt had xrays done at [facility] on saturday and had some changes since last xray. Pt received steroid injection at [facility]. Pain improves a slightly with tylenol  3 and gabapentin  but pain is getting intolerable. Pt also having SOB with exertion for 3 weeks. Was seen by pcp and scheduled for echo. Has not received yet. Denies chest pain. No illnesses recently

## 2024-03-28 ENCOUNTER — Telehealth: Payer: Self-pay

## 2024-03-28 NOTE — Telephone Encounter (Signed)
 Please advise    Copied from CRM [id number]. Topic: Clinical - Medical Advice >> Mar 28, 2024 12:14 PM Allyne Areola wrote: Reason for CRM: Patient was seen in the ER due to severe back pain, she was referred to neurosurgery; however, she has not heard from the department. I offered patient a hospital follow up with Gavin Kast but she declined and stated she wants to see the specialist and if there is anything the office can do to help her get this appointment scheduled.

## 2024-03-28 NOTE — Telephone Encounter (Signed)
 It can take up to 2 weeks for them to contact her for scheduling.

## 2024-03-29 ENCOUNTER — Other Ambulatory Visit (HOSPITAL_COMMUNITY): Payer: Self-pay

## 2024-03-29 NOTE — Telephone Encounter (Signed)
 Patient calling in regards to  pa. Please advise.

## 2024-03-29 NOTE — Telephone Encounter (Signed)
 PA status is still pending. Test billing confirms PA still needed, so status is still pending.

## 2024-03-30 ENCOUNTER — Telehealth: Payer: Self-pay

## 2024-03-30 ENCOUNTER — Other Ambulatory Visit (HOSPITAL_COMMUNITY): Payer: Self-pay

## 2024-03-30 ENCOUNTER — Ambulatory Visit (HOSPITAL_COMMUNITY): Attending: Cardiology

## 2024-03-30 DIAGNOSIS — I5030 Unspecified diastolic (congestive) heart failure: Secondary | ICD-10-CM | POA: Diagnosis not present

## 2024-03-30 DIAGNOSIS — R0609 Other forms of dyspnea: Secondary | ICD-10-CM | POA: Diagnosis not present

## 2024-03-30 NOTE — Telephone Encounter (Signed)
 Pt is active on mychart & has been notified of approval.

## 2024-03-30 NOTE — Telephone Encounter (Signed)
 Pharmacy Patient Advocate Encounter  Received notification from [facility] that Prior Authorization for Rinvoq  30MG  er tablet has been APPROVED from 03-30-2024 to 03-30-2025   PA #/Case ID/Reference #: [id number]

## 2024-03-30 NOTE — Telephone Encounter (Signed)
 Pharmacy Patient Advocate Encounter  Received notification from [facility] that Prior Authorization for Esomeprazole  Magnesium  40MG  dr capsules has been APPROVED from 03-30-2024 to 03-30-2025   PA #/Case ID/Reference #: [id number]

## 2024-03-30 NOTE — Telephone Encounter (Signed)
 Pharmacy Patient Advocate Encounter   Received notification from Patient Advice Request messages that prior authorization for Esomeprazole  Magnesium  40MG  dr capsules is required/requested.   Insurance verification completed.   The patient is insured through [facility] .   Per test claim: PA required; PA submitted to above mentioned insurance via CoverMyMeds Key/confirmation #/EOC [id number] Status is pending   **Previous submission was cancelled and archived by an outside source

## 2024-03-30 NOTE — Telephone Encounter (Signed)
 I have contacted the insurance, again, and the request was started and additional information was requested. These forms were faxed to your office with no response. I initiated a verbal PA over the phone, marked as urgent. Once required chart notes are sent (which I am sending once gathered) then the request should receive a determination in less than 24 hours. Was told could be as early as this afternoon. When insurance companies send out requests for additional information, it defaults to the office fax number(s), and we never received this. Please keep an eye out for these faxes and send to PA team immediately to prevent future delays.

## 2024-03-30 NOTE — Telephone Encounter (Signed)
 Pharmacy Patient Advocate Encounter   Received notification from Patient Advice Request messages that prior authorization for Rinvoq  30MG  er tablet is required/requested.   Insurance verification completed.   The patient is insured through [facility] .   Per test claim: PA required; PA submitted to above mentioned insurance via Verbal and Fax Key/confirmation #/EOC Urgent [id number] Status is pending   PA # [id number]

## 2024-03-31 LAB — ECHOCARDIOGRAM COMPLETE
Area-P 1/2: 3.81 cm2
S' Lateral: 3 cm

## 2024-04-02 ENCOUNTER — Encounter: Payer: Self-pay | Admitting: Internal Medicine

## 2024-04-02 DIAGNOSIS — Z113 Encounter for screening for infections with a predominantly sexual mode of transmission: Secondary | ICD-10-CM | POA: Diagnosis not present

## 2024-04-02 DIAGNOSIS — M5116 Intervertebral disc disorders with radiculopathy, lumbar region: Secondary | ICD-10-CM | POA: Diagnosis not present

## 2024-04-02 DIAGNOSIS — Z01419 Encounter for gynecological examination (general) (routine) without abnormal findings: Secondary | ICD-10-CM | POA: Diagnosis not present

## 2024-04-02 DIAGNOSIS — Z124 Encounter for screening for malignant neoplasm of cervix: Secondary | ICD-10-CM | POA: Diagnosis not present

## 2024-04-02 DIAGNOSIS — Z01411 Encounter for gynecological examination (general) (routine) with abnormal findings: Secondary | ICD-10-CM | POA: Diagnosis not present

## 2024-04-02 DIAGNOSIS — Z1331 Encounter for screening for depression: Secondary | ICD-10-CM | POA: Diagnosis not present

## 2024-04-02 DIAGNOSIS — Z1231 Encounter for screening mammogram for malignant neoplasm of breast: Secondary | ICD-10-CM | POA: Diagnosis not present

## 2024-04-02 LAB — HM MAMMOGRAPHY

## 2024-04-03 ENCOUNTER — Other Ambulatory Visit: Payer: Self-pay

## 2024-04-03 ENCOUNTER — Encounter: Payer: Self-pay | Admitting: Physical Therapy

## 2024-04-03 ENCOUNTER — Other Ambulatory Visit: Payer: Self-pay | Admitting: Internal Medicine

## 2024-04-03 ENCOUNTER — Encounter: Payer: Self-pay | Admitting: Internal Medicine

## 2024-04-03 ENCOUNTER — Ambulatory Visit (INDEPENDENT_AMBULATORY_CARE_PROVIDER_SITE_OTHER): Admitting: Pediatrics

## 2024-04-03 ENCOUNTER — Ambulatory Visit: Admitting: Physical Therapy

## 2024-04-03 ENCOUNTER — Encounter: Payer: Self-pay | Admitting: Obstetrics and Gynecology

## 2024-04-03 ENCOUNTER — Ambulatory Visit: Admitting: Pediatrics

## 2024-04-03 ENCOUNTER — Encounter: Payer: Self-pay | Admitting: Pediatrics

## 2024-04-03 ENCOUNTER — Other Ambulatory Visit

## 2024-04-03 VITALS — BP 110/68 | HR 83 | Ht 62.0 in | Wt 167.0 lb

## 2024-04-03 DIAGNOSIS — K760 Fatty (change of) liver, not elsewhere classified: Secondary | ICD-10-CM | POA: Diagnosis not present

## 2024-04-03 DIAGNOSIS — K51019 Ulcerative (chronic) pancolitis with unspecified complications: Secondary | ICD-10-CM

## 2024-04-03 DIAGNOSIS — Z7960 Long term (current) use of unspecified immunomodulators and immunosuppressants: Secondary | ICD-10-CM

## 2024-04-03 DIAGNOSIS — R002 Palpitations: Secondary | ICD-10-CM

## 2024-04-03 DIAGNOSIS — K5100 Ulcerative (chronic) pancolitis without complications: Secondary | ICD-10-CM | POA: Diagnosis not present

## 2024-04-03 DIAGNOSIS — M6281 Muscle weakness (generalized): Secondary | ICD-10-CM

## 2024-04-03 DIAGNOSIS — K219 Gastro-esophageal reflux disease without esophagitis: Secondary | ICD-10-CM | POA: Diagnosis not present

## 2024-04-03 DIAGNOSIS — R748 Abnormal levels of other serum enzymes: Secondary | ICD-10-CM

## 2024-04-03 DIAGNOSIS — R0609 Other forms of dyspnea: Secondary | ICD-10-CM

## 2024-04-03 DIAGNOSIS — K51919 Ulcerative colitis, unspecified with unspecified complications: Secondary | ICD-10-CM

## 2024-04-03 DIAGNOSIS — M79604 Pain in right leg: Secondary | ICD-10-CM | POA: Diagnosis not present

## 2024-04-03 DIAGNOSIS — R29898 Other symptoms and signs involving the musculoskeletal system: Secondary | ICD-10-CM | POA: Diagnosis not present

## 2024-04-03 DIAGNOSIS — M5417 Radiculopathy, lumbosacral region: Secondary | ICD-10-CM

## 2024-04-03 NOTE — Therapy (Signed)
 OUTPATIENT PHYSICAL THERAPY THORACOLUMBAR EVALUATION   Patient Name: Melissa Cohen MRN: [id number] DOB:[birth year], 58 y.o., female Today's Date: 04/03/2024  END OF SESSION:  PT End of Session - 04/03/24 1529     Visit Number 1    Number of Visits 13    Date for PT Re-Evaluation 05/15/24    Authorization Type [employer]    Authorization Time Period 04/03/24 to 05/15/24    PT Start Time 1528   pt late   PT Stop Time 1558    PT Time Calculation (min) 30 min    Activity Tolerance Patient tolerated treatment well    Behavior During Therapy [facility] for tasks assessed/performed             Past Medical History:  Diagnosis Date   Allergy    Anemia    Anxiety    Arthritis    Osteo arthritis - bil knees   Asthma    as a young adult related to allergy flare   Atypical nevi 08/25/2018   1. LEFT MID BACK (MILD) - NO TX, 2. RIGHT CHEST (SEVERE) - W/S   Atypical nevi 09/14/2018   MID BACK (SEVERE) - W/S   Atypical nevi 11/13/2018   LEFT POST. NECK INCIDENTRAL MOLE (MILD)   Atypical nevi 11/27/2018   RIGHT CHEST + MARGIN   Atypical nevus 01/02/2018   LEFT UPPER BACK (SEVERE) - W/S   Atypical nevus 01/04/2019   LEFT STERNUM ( MODERATE)   Atypical nevus 01/04/2019   LEFT POST NECK INF. (SEVERE)   Atypical nevus 01/04/2019   RECURRENT MID BACK - [facility] DR. PAVLIS   Back pain    Basal cell carcinoma 06/06/2008   LEFT UPPER SHOULDER PROX. @ [facility]   Blood transfusion without reported diagnosis    from [facility] several   Chest pain    Depression    Fatty liver    Gallbladder problem    Gastric ulcer    GERD (gastroesophageal reflux disease)    Heart murmur    Hyperlipidemia    Hypertension    Iron deficiency anemia    Joint pain    Knee pain    Lower extremity edema    Melanoma (HCC)    right cleavage, posterior left shoulder   MM (malignant melanoma of skin) (HCC) 09/14/2018   LEFT POST. 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 Was then started back on Remicade  and is doing well since.   Vitamin D  deficiency    Past Surgical History:  Procedure Laterality Date   APPENDECTOMY     BREAST ENHANCEMENT SURGERY  2004   CHOLECYSTECTOMY  2011   COLONOSCOPY  2019   LAPAROTOMY N/A 06/17/2023   Procedure: EXPLORATORY LAPAROTOMY, REPAIR OF COLOTOMY;  Surgeon: Joyce Nixon, MD;  Location: [facility];  Service: General;  Laterality: N/A;   right eye lasix     02/17/12   TONSILLECTOMY AND ADENOIDECTOMY  TUBAL LIGATION     UPPER GASTROINTESTINAL ENDOSCOPY     Uterine ablation     WISDOM TOOTH EXTRACTION     Patient Active Problem List   Diagnosis Date Noted   Seizures (HCC)    Restless leg syndrome    Hyperlipidemia    Sleep apnea    Perforation of colon as colonoscopy complication (HCC) 06/17/2023   Low back pain 05/13/2021   Adhesive capsulitis of left shoulder 04/01/2021   Metabolic syndrome 02/08/2021   At risk for impaired metabolic function 12/09/2020   Polyphagia 11/04/2020   Mood disorder, with emotional eating 11/04/2020   At risk for activity intolerance 11/04/2020   Vasovagal near syncope 03/26/2020   Frequent falls 02/13/2020   HTN (hypertension) 02/13/2020   Systolic murmur 03/26/2017   Chronic rhinitis 03/17/2016   Menopausal syndrome (hot flushes) 08/23/2014   Screening for malignant neoplasm of cervix 02/25/2014   Anxiety and depression 10/08/2011   Disorder of sacrum 04/12/2011   TROCHANTERIC BURSITIS, BILATERAL 04/12/2011   Benign neoplasm of skin 07/14/2010   Cervical radiculopathy 07/14/2010   GERD 12/31/2009   CARCINOMA, BASAL  CELL 03/14/2008   Vitamin D  deficiency 03/06/2008   HYPERTRIGLYCERIDEMIA 03/06/2008   Ulcerative colitis (HCC) 02/09/2007   INSOMNIA 02/09/2007    PCP: Gavin Kast FNP   REFERRING PROVIDER: Conard Decent, MD  REFERRING DIAG:  Diagnosis  M51.16 (ICD-10-CM) - Intervertebral disc disorders with radiculopathy, lumbar region    Rationale for Evaluation and Treatment: Rehabilitation  THERAPY DIAG:  Radiculopathy, lumbosacral region  Pain in right leg  Muscle weakness (generalized)  Other symptoms and signs involving the musculoskeletal system  ONSET DATE: week before last (late April 2025)  SUBJECTIVE:                                                                                                                                                                                           SUBJECTIVE STATEMENT:  One of my discs I guess between L5-S1 is herniated and pressing on my sciatic nerve. Having a lot of pain and its hard. MD wanted to see if we could work on stretching or strengthening, he wanted to start with PT- no injections or anything like this yet. Worse when I first get up and gets better thru the day. Had this before but it went away quickly, this time it started going down my leg so I figured I needed to get seen. Supposed to be out of town for second half of June.    PERTINENT HISTORY:  See above   PAIN:  Are you having pain? Yes: NPRS scale: 2/10 Pain location: R  LE  Pain description: aching in leg, radicular sx to mid calf today but can vary Aggravating factors: nothing, maybe walking on it  Relieving factors: maybe walking shoes instead of barefoot   PRECAUTIONS: None  RED FLAGS: None   WEIGHT BEARING RESTRICTIONS: No  FALLS:  Has patient fallen in last 6 months? Yes. Number of falls 1- fell over some plants she pulled inside to protect from frost, no FOF   LIVING ENVIRONMENT: Lives with: lives with their partner Lives in:  House/apartment   OCCUPATION: travel agent- lots of sitting, lots of computer work, no ergonomic set up   PLOF: Independent, Independent with basic ADLs, Independent with gait, and Independent with transfers  PATIENT GOALS: feel better and quit hurting, strengthen   NEXT MD VISIT: Mina Alter July 8th  OBJECTIVE:  Note: Objective measures were completed at Evaluation unless otherwise noted.  DIAGNOSTIC FINDINGS:  CLINICAL DATA:  Lumbar radiculopathy. History of degenerative disc disease. Right-sided low back pain with right leg pain and foot coldness 4 days. No known injury or prior relevant surgery.   EXAM: MRI LUMBAR SPINE WITHOUT CONTRAST   TECHNIQUE: Multiplanar, multisequence MR imaging of the lumbar spine was performed. No intravenous contrast was administered.   COMPARISON:  Lumbar spine radiographs 03/24/2024. Abdominopelvic CTA 06/16/2023. Lumbar MRI 04/02/2021.   FINDINGS: Segmentation: Conventional anatomy assumed, with the last open disc space designated L5-S1.Concordant with prior imaging.   Alignment: Stable mild convex left scoliosis and mild chronic retrolisthesis at L2-3.   Vertebrae: No worrisome osseous lesion, acute fracture or pars defect. The visualized sacroiliac joints appear unremarkable.   Conus medullaris: Extends to the L1 level. The conus and cauda equina appear normal.   Paraspinal and other soft tissues: No significant paraspinal findings.   Disc levels:   Sagittal images demonstrate no significant disc space findings within the visualized lower thoracic spine.   L1-2: Stable mild loss of disc height with a chronic small left paracentral disc extrusion. Minimal mass effect on the thecal sac without foraminal narrowing or nerve root encroachment.   L2-3: Chronic loss of disc height with annular disc bulging and endplate osteophytes. Mild bilateral facet hypertrophy. Chronic mild lateral recess and foraminal narrowing bilaterally  without nerve root encroachment. The spinal canal is patent.   L3-4: Chronic loss of disc height with annular disc bulging and endplate osteophytes asymmetric to the right. New small disc protrusion in the right subarticular zone with similar mild chronic right lateral recess narrowing. Unchanged mild right foraminal narrowing without L3 nerve root encroachment. The spinal canal and left foramen are patent.   L4-5: Preserved disc height and hydration. Mild facet hypertrophy. No spinal stenosis or significant foraminal narrowing.   L5-S1: Mild loss of disc height. There is a new right-sided disc extrusion with caudal migration of a disc fragment into the right S1 lateral recess. There is mass effect on the right S1 nerve root. Mild facet and ligamentous hypertrophy without resulting foraminal narrowing or L5 nerve root encroachment. The spinal canal and left foramen are patent.   IMPRESSION: 1. New right-sided disc extrusion at L5-S1 with caudal migration of a disc fragment into the right S1 lateral recess and mass effect on the right S1 nerve root. 2. New small disc protrusion in the right subarticular zone at L3-4 with similar mild chronic right lateral recess and right foraminal narrowing. 3. Stable chronic mild lateral recess and foraminal narrowing bilaterally at L2-3. 4. No acute osseous findings or significant central stenosis.  COGNITION: Overall cognitive status: Within functional limits for tasks assessed     SENSATION: Not tested  MUSCLE LENGTH:  L HS and piriformis Min limitation R HS and piriformis Mod limitation  Sciatic nerve R very tense and immobile, (+) tension test   L LE significantly shorter than R, hx of scoliosis   POSTURE: rounded shoulders and forward head  PALPATION:  Lateral R thigh tender, R piriformis tender   LUMBAR ROM:   AROM eval  Flexion Mod limitation, increased pain   Extension WNL, REIS some centralization   Right  lateral flexion WNL   Left lateral flexion WNL   Right rotation   Left rotation    (Blank rows = not tested)    LOWER EXTREMITY MMT:    MMT Right eval Left eval  Hip flexion 3+ 3+  Hip extension    Hip abduction 3 4  Hip adduction    Hip internal rotation    Hip external rotation 2+ 4-  Knee flexion    Knee extension    Ankle dorsiflexion    Ankle plantarflexion    Ankle inversion    Ankle eversion     (Blank rows = not tested)   TREATMENT DATE:    04/03/24  Eval, HEP, POC                                                                                                                                   PATIENT EDUCATION:  Education details: exam findings, anatomy of region and how extension directional preference works/how movement affects her pain, POC, HEP  Person educated: Patient Education method: Explanation, Demonstration, and Handouts Education comprehension: verbalized understanding, returned demonstration, and needs further education  HOME EXERCISE PROGRAM: Access Code: [email or web address] URL: [email or web address] Date: 04/03/2024 Prepared by: Terrel Ferries  Exercises - Standing Lumbar Extension at Wall - Forearms  - 2-3 x daily - 7 x weekly - 1 sets - 10 reps - Hooklying Hamstring Stretch with Strap  - 2-3 x daily - 7 x weekly - 1 sets - 3 reps - 30 seconds  hold - Supine Transversus Abdominis Bracing - Hands on Ground  - 5 x daily - 7 x weekly - 1 sets - 10 reps - 5 seconds  hold  ASSESSMENT:  CLINICAL IMPRESSION: Patient is a 58 y.o. F who was seen today for physical therapy evaluation and treatment for  Diagnosis  M51.16 (ICD-10-CM) - Intervertebral disc disorders with radiculopathy, lumbar region  . She was late to eval, so assessment was a bit limited. She did respond well to extension based movements, does have a lot of mm weakness in core and hips, also s/p abdominal surgery which is definitely causing some core weakness. Anticipate she  will respond well to PT.   OBJECTIVE IMPAIRMENTS: decreased mobility, decreased ROM, decreased strength, hypomobility, increased fascial restrictions, increased muscle spasms, impaired flexibility, impaired sensation, improper body mechanics, postural dysfunction, and pain.  ACTIVITY LIMITATIONS: carrying, lifting, bending, and locomotion level  PARTICIPATION LIMITATIONS: meal prep, cleaning, laundry, shopping, community activity, occupation, and yard work  PERSONAL FACTORS: Age, Fitness, Past/current experiences, Social background, and Time since onset of injury/illness/exacerbation are also affecting patient's functional outcome.   REHAB POTENTIAL: Good  CLINICAL DECISION MAKING: Stable/uncomplicated  EVALUATION COMPLEXITY: Low   GOALS: Goals reviewed with patient? No  SHORT TERM GOALS: Target date: 04/24/2024    Will be compliant with appropriate progressive HEP GOAL STATUS: Initial   2. Will demonstrate improved postural awareness with all functional tasks, use of ergonomic aides PRN/as desired GOAL STATUS: Initial   3. Will demonstrate good functional biomechanics for bed mobility and floor to waist lifting mechanics    LONG TERM GOALS: Target date: 05/15/2024   MMT to have improved by one grade all weak groups GOAL STATUS: Initial  2. Mm flexibility and spasms to have improved by at least 50% in order to improve functional movement patterns and for pain control GOAL STATUS: Initial  3. Pain to be no more than 2/10 with all functional activities GOAL STATUS: Initial   4. Radicular symptoms to have resolved by at least 75%      .  PLAN:  PT FREQUENCY: 2x/week  PT DURATION: 6 weeks  PLANNED INTERVENTIONS: 97750- Physical Performance Testing, 97110-Therapeutic exercises, 97530- Therapeutic activity, [id number]- Neuromuscular re-education, 97535- Self Care, [id number]- Manual therapy, [id number]- Aquatic Therapy, [id number]- Traction (mechanical), Taping, and Dry  Needling.  PLAN FOR NEXT SESSION: extension directional preference- progress as appropriate, work on strength and biomechanics, flexibility   Terrel Ferries, PT, DPT 04/03/24 4:06 PM

## 2024-04-03 NOTE — Progress Notes (Unsigned)
 [facility] Return Visit   Referring Provider Melissa Nett, DO [address] [place],  [place] [address]  Primary Care Provider Melissa Kast, FNP  Patient Profile: Melissa Cohen is a 58 y.o. female who returns to the Gastroenterology Clinic for follow-up of the problem(s) noted below.  She is a former patient of Melissa Cohen, now established with Dr. Yvone Cohen.  Problem List: Pan ulcerative colitis (diagnosed 1984, age 52) GERD Hepatic steatosis with mildly elevated transaminases  History of Present Illness   Melissa Cohen was last seen in the GI office 10/04/2023 with Melissa Cohen for follow-up of pan ulcerative colitis, GERD, and hepatic steatosis with mildly elevated transaminases.  At that time patient was feeling very well except for having 3 loose stools per day.  She has history of a small perforation in her transverse colon following her last colonoscopy in July 2024.  She underwent exploratory laparotomy with primary repair and has had a good recovery.  Surveillance biopsies or scope related injury are potential causes of perforation.  Plan at last visit was to continue Rinvoq  30 mg p.o. daily, Nexium  40 mg daily, famotidine  40 mg at bedtime.  She is due for repeat surveillance colonoscopy July 2027.  Other past medical history significant for malignant melanoma of neck and chest (2019), basal cell carcinoma of left upper shoulder(2009), prior cholecystectomy, HTN, HLD, IDA, vitamin D  deficiency, sleep apnea, osteoarthritis.  Today, patient states she is doing well overall.  She is happy on Rinvoq  and feels that this works well for her.  She is on Victoza  for weight loss as well as oxycodone  for pain management of a herniated disc, and between these 2 medications has been having some hard stools and straining, and having a bowel movement every other day.  She states she is planning to transition off of oxycodone  soon, and has an evaluation with PT later  today. She does have OTC stool softener/laxatives at home that she can take if needed.  Prior to starting oxycodone , patient was having 1-2 soft bowel movements daily.  She noticed a small spot of blood on the toilet paper last week after straining with a bowel movement, but otherwise denies any bloody stools.  Denies melena.  She may have diarrhea only if she consumes dairy, otherwise no issues with loose stools.  Her GERD symptoms are largely controlled on Nexium  40 mg daily, which has worked well for her for years.  She takes Pepcid  as needed for breakthrough symptoms, and does state she has been having to take Pepcid  more frequently lately, having breakthrough epigastric burning pain about twice a week.  She does not always take her Nexium  on an empty stomach 30 minutes before meals, often taking it with her meal.  She was on Ozempic  previously but had to discontinue this as her insurance stopped covering it.  She subsequently gained about 20 pounds, and thinks this weight gain may have contributed to the worsening of her reflux symptoms.  Otherwise, she denies abdominal pain, and denies dysphagia.  She has occasional nausea with Victoza , for which she takes Zofran  as needed, usually 1-2 times a month.  She reports a remote history of gastric ulcer years ago when she was under a lot of stress in her early 6s.  She has a history of skin cancer (melanoma in situ, basal cell carcinoma), and has an annual skin exam with her dermatologist.  Patient reports that both her mother and maternal grandmother were diagnosed with  ulcerative colitis later in life.  Patient's father was diagnosed with appendiceal adenocarcinoma at age 18.  Her high cholesterol is currently being managed with diet.  Reports she is planning to have a DEXA scan and had an updated Pap smear yesterday.  Reports history of long Covid, has had some blurry vision, sensitivity to smell and light.   Drinks alcohol occasionally.  Former smoker. Currently works as a Firefighter.    Current GI Meds  Rinvoq  30mg  daily Nexium  40mg  daily Pepcid  40mg  daily  Interval History   Last colonoscopy: 06/16/2023 - A few erosions in the terminal ileum. Biopsied. Pancolitis ulcerative colitis, unchanged since last examination. Biopsied. Multiple small pseudopolyps in the rectum and in the sigmoid colon. External and internal hemorrhoids.   Last endoscopy: 02/18/2012 - Small hiatal hernia, otherwise normal   Last Abd CT/CTE/MRE: CT A/P 06/22/2023 - Postop changes from recent colonic repair, with near-complete resolution of free intraperitoneal air since prior study. No evidence of abscess, free fluid, or other acute findings. Tiny hiatal hernia.   General Review of Systems  Review of systems is significant for the pertinent positives and negatives as listed per the HPI.  Full ROS is otherwise negative.  Inflammatory Bowel Disease History   1984/1985: Diagnosed with UC age 2, seen by multiple GI physicians prior to Melissa Cohen (one in [place], [place], and one in [place], [place]). 10/2009: Patient reported inactive disease for 10 years and was not on any medications. 11/2009: Colonoscopy - Mild active UC 12/2009: Started Lialda  2.4 g daily 08/2011: Restarted Lialda  2.4 g daily, having discontinued it a few months after her last office visit 01/2012: Colonoscopy - Universal minimally active chronic colitis negative for dysplasia 03/2016: Patient seen for blood in stool, had stopped her Lialda  for several years. Started Lialda  2.4g BID. 04/2016: Colonoscopy - Chronic markedly active colitis with erosions in the terminal ileum, severe inflammation from anus to splenic flexure 04/2016: Hospitalized from 6/28 to 6/30 to receive IV corticosteroids and first dose of Remicade  due to inadequate response to mesalamine , prednisone , and Robinul .  05/2016: Markedly improved on Remicade , mesalamine  discontinued due to diarrhea side effect, prednisone   tapered, continue Robinul  and Lomotil  as needed 10/2016: Colonoscopy - Diffuse mild inflammation 09/2018: Colonoscopy - Inflammation in the rectum and at the cecum graded as Mayo score 1, improved compared to previous exams, inflammatory polyps 04/2021: Diffuse mild inflammation found in the entire colon, focal glandular atypia in the left colon 07/2021: Started on Humira  due to difficulty with infusion center  10/2022: Discussed stopping Humira  and starting new medication due to positive ANA with low complement levels associated with new hand/forearm pain and swelling/stiffness.  12/2022: Started on Rinvoq  15mg  daily 01/2023: Increased to Rivoq 30mg  daily 05/2023: Colonoscopy - A few erosions in the terminal ileum, pancolitis ulcerative colitis, unchanged since last examination, minimally active chronic ileitis. Colonoscopy-induced perforation of transverse colon, s/p exploratory laparotomy with primary repair.    IBD Medication History Mesalamine   Prednisone  Remicade   Humira  Rinvoq   Past Medical History   Past Medical History:  Diagnosis Date   Allergy    Anemia    Anxiety    Arthritis    Osteo arthritis - bil knees   Asthma    as a young adult related to allergy flare   Atypical nevi 08/25/2018   1. LEFT MID BACK (MILD) - NO TX, 2. RIGHT CHEST (SEVERE) - W/S   Atypical nevi 09/14/2018   MID BACK (SEVERE) - W/S   Atypical nevi  11/13/2018   LEFT POST. NECK INCIDENTRAL MOLE (MILD)   Atypical nevi 11/27/2018   RIGHT CHEST + MARGIN   Atypical nevus 01/02/2018   LEFT UPPER BACK (SEVERE) - W/S   Atypical nevus 01/04/2019   LEFT STERNUM ( MODERATE)   Atypical nevus 01/04/2019   LEFT POST NECK INF. (SEVERE)   Atypical nevus 01/04/2019   RECURRENT MID BACK - [facility] DR. PAVLIS   Back pain    Basal cell carcinoma 06/06/2008   LEFT UPPER SHOULDER PROX. @ [facility]   Blood transfusion without reported diagnosis    from [facility] several   Chest pain    Depression     Fatty liver    Gallbladder problem    Gastric ulcer    GERD (gastroesophageal reflux disease)    Heart murmur    Hyperlipidemia    Hypertension    Iron deficiency anemia    Joint pain    Knee pain    Lower extremity edema    Melanoma (HCC)    right cleavage, posterior left shoulder   MM (malignant melanoma of skin) (HCC) 09/14/2018   LEFT POST. NECK MIS - EXC   MM (malignant melanoma of skin) (HCC) 09/14/2018   RIGHT CHEST MIS - EXC   Obesity    Restless leg syndrome    Seizures (HCC)    2-3 d/t hypogylcermia - last seizure was 1998   Shortness of breath on exertion    Sleep apnea    sleep study showed mild sleep apnea - no c-papa needed   Ulcerative colitis    Dx at age 45; had been 30 years since her last flareup and then had a flareup recently in summer of 2017. Was then started back on Remicade  and is doing well since.   Vitamin D  deficiency       Past Surgical History   Past Surgical History:  Procedure Laterality Date   APPENDECTOMY     BREAST ENHANCEMENT SURGERY  2004   CHOLECYSTECTOMY  2011   COLONOSCOPY  2019   LAPAROTOMY N/A 06/17/2023   Procedure: EXPLORATORY LAPAROTOMY, REPAIR OF COLOTOMY;  Surgeon: Joyce Nixon, MD;  Location: [facility];  Service: General;  Laterality: N/A;   right eye lasix     02/17/12   TONSILLECTOMY AND ADENOIDECTOMY     TUBAL LIGATION     UPPER GASTROINTESTINAL ENDOSCOPY     Uterine ablation     WISDOM TOOTH EXTRACTION        Allergies and Medications   Allergies  Allergen Reactions   Tagamet [Cimetidine] Anaphylaxis   Effexor  [Venlafaxine ] Swelling   Mobic [Meloxicam] Swelling   Ultram  [Tramadol ] Swelling     Family History   Family History  Problem Relation Age of Onset   Prostate cancer Father    Hyperlipidemia Father    Barrett's esophagus Father    Cancer Father        prostate   Colon cancer Father 26       appendical adenocarcinoma 2011   Hypertension Father    Diabetes Sister    Diabetes Other         Grandparents   Heart attack Maternal Grandfather    Heart disease Maternal Grandfather    Colitis Maternal Grandfather    Ulcerative colitis Mother    Hypertension Mother    Hyperlipidemia Mother    Depression Mother    Colon polyps Mother    Ulcerative colitis Maternal Grandmother    Crohn's disease  Brother    Esophageal cancer Neg Hx    Rectal cancer Neg Hx    Stomach cancer Neg Hx    Allergic rhinitis Neg Hx    Angioedema Neg Hx    Asthma Neg Hx    Atopy Neg Hx    Eczema Neg Hx    Immunodeficiency Neg Hx      Social History   Social History   Social History Narrative   1-2 cups of tea a day.   Currently lives with her partner having. She divorced. She likes to walk daily doing exercise. She now does pet care.   Calista reports that she quit smoking about 18 years ago. Her smoking use included cigarettes. She started smoking about 43 years ago. She has a 12.5 pack-year smoking history. She has never used smokeless tobacco. She reports current alcohol use of about 3.0 standard drinks of alcohol per week. She reports that she does not use drugs.  Vital Signs and Physical Examination  BP 110/68   Pulse 83   Ht 5\' 2"  (1.575 m)   Wt 167 lb (75.8 kg)   LMP 09/02/2013   BMI 30.54 kg/m      Physical Exam Vitals reviewed.  Constitutional:      Appearance: Normal appearance.  HENT:     Head: Normocephalic and atraumatic.     Right Ear: External ear normal.     Left Ear: External ear normal.     Nose: Nose normal.  Eyes:     Extraocular Movements: Extraocular movements intact.     Conjunctiva/sclera: Conjunctivae normal.  Cardiovascular:     Rate and Rhythm: Normal rate and regular rhythm.     Pulses: Normal pulses.     Heart sounds: Normal heart sounds.  Pulmonary:     Effort: Pulmonary effort is normal.     Breath sounds: Normal breath sounds.  Abdominal:     General: Bowel sounds are normal. There is no distension.     Palpations: Abdomen is soft. There is no  mass.     Tenderness: There is no abdominal tenderness. There is no guarding or rebound.  Musculoskeletal:     Cervical back: Normal range of motion and neck supple.  Skin:    General: Skin is warm and dry.  Neurological:     General: No focal deficit present.     Mental Status: She is alert and oriented to person, place, and time.  Psychiatric:        Mood and Affect: Mood normal.        Behavior: Behavior normal.        Thought Content: Thought content normal.        Judgment: Judgment normal.       Review of Data  The following data was reviewed at the time of this encounter:  Laboratory Studies      Latest Ref Rng & Units 03/27/2024    1:07 PM 03/01/2024    1:55 PM 10/04/2023    2:41 PM  CBC  WBC 4.0 - 10.5 K/uL 8.5  5.5  5.9   Hemoglobin 12.0 - 15.0 g/dL 84.1  32.4  40.1   Hematocrit 36.0 - 46.0 % 38.9  40.2  38.9   Platelets 150 - 400 K/uL 371  335.0  428.0     Lab Results  Component Value Date   LIPASE 44 06/16/2023      Latest Ref Rng & Units 03/27/2024    1:07 PM 03/01/2024  1:55 PM 10/04/2023    2:41 PM  CMP  Glucose 70 - 99 mg/dL 161  92  87   BUN 6 - 20 mg/dL 19  11  11    Creatinine 0.44 - 1.00 mg/dL 0.96  0.45  4.09   Sodium 135 - 145 mmol/L 139  141  140   Potassium 3.5 - 5.1 mmol/L 4.2  3.9  4.3   Chloride 98 - 111 mmol/L 106  106  105   CO2 22 - 32 mmol/L 24  28  28    Calcium  8.9 - 10.3 mg/dL 9.8  9.4  9.3   Total Protein 6.5 - 8.1 g/dL 7.4  6.9  7.0   Total Bilirubin 0.0 - 1.2 mg/dL 0.3  0.5  0.3   Alkaline Phos 38 - 126 U/L 71  57  60   AST 15 - 41 U/L 50  17  24   ALT 0 - 44 U/L 86  12  23    Labs 02/24/2023 with Rheumatology: - Negative QuantTBGold - HepB surface Ab nonreactive - HepB surface Ag nonreactive - HepB core Ab nonreactive - HepC Ab nonreactive   Imaging Studies   CT A/P 06/22/2023 - Postop changes from recent colonic repair, with near-complete resolution of free intraperitoneal air since prior study. No evidence of abscess,  free fluid, or other acute findings. Tiny hiatal hernia.  MR Abdomen 04/16/2022 - The hepatic lesion of concern is a 5 mm cyst. There is also an 11 mm cyst posteriorly in the right hepatic lobe. No worrisome hepatic lesion. Constipation. Degenerative disc disease at L2-3. Collapsed bilateral breast implants.  GI Procedures and Studies   Colonoscopy 06/16/2023 - A few erosions in the terminal ileum. Biopsied. Pancolitis ulcerative colitis, unchanged since last examination. Biopsied. Multiple small pseudopolyps in the rectum and in the sigmoid colon. External and internal hemorrhoids.  Bx: Minimally active chronic ileitis, negative for dysplasia or malignancy  Colonoscopy 05/14/2021 - Diffuse mild inflammation found in the entire examined colon secondary to quiescent ulcerative colitis.  Two 6 to 7 mm polyps in the rectum and in the sigmoid colon. Multiple 4 to 8 mm polyps in the rectum, in the sigmoid colon, and in the descending colon.  External and internal hemorrhoids. Bx: Focal glandular atypia in the left colon with no evidence of high-grade dysplasia  Colonoscopy 10/06/2018 - 4 mm polyp in the rectum, two 6-9 mm polyps in the transverse colon.  Pancolitis ulcerative colitis.  Inflammation found in the rectum and at the cecum, graded as Mayo score 1 (mild disease), improved compared to previous exams.  Internal hemorrhoids Bx: Inflammatory polyps with inflamed granulation tissue, negative for dysplasia.  No adenomatous change or malignancy.  No active inflammation or granulomas.  Colonoscopy 11/09/2016 - 20 mm polyp in the distal transverse colon, 18 mm polyp in the proximal descending colon. Diffuse mild inflammation with scarring found in the transverse colon, at the hepatic flexure, in the ascending colon, and in the cecum secondary to quiescent ulcerative colitis.  20 mm polyp in the mid descending colon.  Diffuse mild inflammation found in the rectum, in the sigmoid colon, in the descending  colon, and at the splenic flexure secondary to ulcerative colitis. Bx: Mildly active chronic colitis consistent with ulcerative colitis, inflammatory polyp showing markedly active changes, no dysplasia or malignancy identified  Colonoscopy 05/13/2016 - A few erosions in the terminal ileum, left-sided colitis, inflammation found from the anus to the splenic flexure which was severe. Bx: Chronic markedly  active colitis with erosion, consistent with ulcerative colitis.  CMV immunostain negative for cytomegalovirus, no dysplasia or malignancy identified.  EGD/colonoscopy 02/18/2012 - Hiatal hernia. Universal UC throughout the colon, pseudopolyps, multiple in the rectum and sigmoid colon. Bx: Minimally active chronic colitis, negative for dysplasia  Colonoscopy 12/05/2009 - Mild universal UC, ileitis, 3 to 7 mm polyps, multiple in the rectum and sigmoid colon, all except one appeared to be pseudopolyps. Bx: Focal active ileitis, no granulomas.  Patchy chronic active colitis consistent with inflammatory bowel disease, inflammatory pseudopolyps  Clinical Impression  It is my clinical impression that Ms. Mathew is a 58 y.o. female with;  Pan ulcerative colitis GERD Hepatic steatosis  Jali was diagnosed with pan ulcerative colitis at the age of 36 initially managed with oral/rectal mesalamine  and corticosteroids.  She relates ceasing treatment for period of time and seemingly did well until 2017 when she was hospitalized with a severe flare of her ulcerative colitis necessitating rescue infliximab  to which she demonstrated a favorable response.  Cherisse remained on infliximab  until 2022 when she made the decision to switch to adalimumab  primarily for convenience reasons because of difficulty coordinating infusions.  In 2023 she began to develop musculoskeletal symptoms with swelling and stiffness of her hand and forearm felt to be a side effect of anti-TNF therapy.  Notes indicate that she had a positive  ANA and low complements.  As such she was subsequently transitioned to Rinvoq  in 2023.  Her dose was briefly decreased to 15 mg and subsequently returned to 30 mg orally daily.  Karinna underwent colonoscopy in 05/2023 for which confirmed endoscopic remission of disease-there was scarring and a few pseudopolyps.  Noted to have erosions in the terminal ileum with minimally active chronic ileitis-backwash ileitis versus ?  Stigmata of Crohn's disease.  Her colonoscopy was complicated by a perforation near the hepatic flexure managed surgically.  She has recovered well from this and reports stable bowel function.  At today's visit she endorses being in symptomatic remission.  We discussed performing an interval fecal calprotectin to confirm no recrudescence of inflammation.  Issues related to IBD health maintenance were reviewed.  She has a pertinent history of skin cancer and is following with dermatology.  We also discussed an updated DEXA scan.  She states this was recently bridged with her by her primary care doctor and will be scheduling this in the future.    GERD appears to be well-controlled on Nexium  and Pepcid .  No changes made.  Skilynn's labs have disclosed mildly elevated hepatic transaminases.  Previous ultrasound imaging in 2021 demonstrated evidence of hepatic steatosis.  CT and MR imaging over the last 2 years has shown stable hepatic cysts and no concerning findings in her liver.  Records suggest that her elevated liver enzymes have been attributed to a form of MASLD.  At a future visit we can review her previous hepatology workup to ensure forms of chronic hepatitides have been thoroughly evaluated.   Plan  Continue Rinvoq  30mg  daily. Continue Nexium  40mg  daily. Take 30 minutes before meals on an empty stomach. Continue Pepcid  40mg  prn. Order labs today: QuantiferonTB Gold, fecal calprotectin Follow up in 6 months.   IBD Health Maintenance  Vaccinations Influenza: PCV13:  06/2018 PPSV23: 10/2018 COVID19: Series 2021, booster 2022 HAV/HBV: Shingles: 05/19/2021, 08/19/2021 HPV:  DEXA Patient states being ordered by PCP 2025  Pap Smear    Eye Exam As needed  Skin Exam History of skin cancer-follows with dermatology at least annually  Surveillance Colonoscopy  Due 2027  Tobacco Use None  Depression Screen     Planned Follow Up 6 month follow up   The patient or caregiver verbalized understanding of the material covered, with no barriers to understanding. All questions were answered. Patient or caregiver is agreeable with the plan outlined above.    Eugenia Hess, MD [facility]  I have reviewed the clinic note as outlined by Valiant Gaul, PA and agree with the assessment, plan and medical decision making.  Evonna returns to the office today for follow-up of pan ulcerative colitis diagnosed at age 87.  She was previously treated with steroids, mesalamine , infliximab  and adalimumab .  Now on Rinvoq  and doing well.  Colonoscopy 05/2013 confirmed endoscopic remission of disease.  Incurred a perforation with her colonoscopy managed surgically.  She has recovered well.  GERD is stable.  Reviewed that she has had elevated liver enzymes that have been attributed to MASLD -will review liver workup at next visit.  Eugenia Hess, MD

## 2024-04-03 NOTE — Patient Instructions (Signed)
 Your provider has requested that you go to the basement level for lab work before leaving today. Press "B" on the elevator. The lab is located at the first door on the left as you exit the elevator.  Due to recent changes in healthcare laws, you may see the results of your imaging and laboratory studies on MyChart before your provider has had a chance to review them.  We understand that in some cases there may be results that are confusing or concerning to you. Not all laboratory results come back in the same time frame and the provider may be waiting for multiple results in order to interpret others.  Please give us  48 hours in order for your provider to thoroughly review all the results before contacting the office for clarification of your results.   Follow up in 6 months.  Thank you for entrusting me with your care and for choosing [facility],  Dr. Eugenia Hess  _______________________________________________________  If your blood pressure at your visit was 140/90 or greater, please contact your primary care physician to follow up on this.  _______________________________________________________  If you are age 60 or older, your body mass index should be between 23-30. Your Body mass index is 30.54 kg/m. If this is out of the aforementioned range listed, please consider follow up with your Primary Care Provider.  If you are age 95 or younger, your body mass index should be between 19-25. Your Body mass index is 30.54 kg/m. If this is out of the aformentioned range listed, please consider follow up with your Primary Care Provider.   ________________________________________________________  The [facility] providers would like to encourage you to use MYCHART to communicate with providers for non-urgent requests or questions.  Due to long hold times on the telephone, sending your provider a message by [facility] may be a faster and more efficient way to get a response.  Please allow 48  business hours for a response.  Please remember that this is for non-urgent requests.  _______________________________________________________

## 2024-04-04 ENCOUNTER — Telehealth: Payer: Self-pay

## 2024-04-04 ENCOUNTER — Other Ambulatory Visit (HOSPITAL_COMMUNITY): Payer: Self-pay

## 2024-04-04 DIAGNOSIS — M5116 Intervertebral disc disorders with radiculopathy, lumbar region: Secondary | ICD-10-CM | POA: Insufficient documentation

## 2024-04-04 MED ORDER — GABAPENTIN 300 MG PO CAPS
300.0000 mg | ORAL_CAPSULE | Freq: Three times a day (TID) | ORAL | 1 refills | Status: DC
Start: 2024-04-04 — End: 2024-04-06

## 2024-04-04 MED ORDER — ACETAMINOPHEN 500 MG PO TABS: 1000.0000 mg | ORAL_TABLET | Freq: Three times a day (TID) | ORAL | 0 refills | Status: AC | PRN

## 2024-04-04 MED ORDER — OXYCODONE HCL 5 MG PO TABS: 5.0000 mg | ORAL_TABLET | ORAL | 0 refills | Status: DC | PRN

## 2024-04-04 NOTE — Telephone Encounter (Signed)
 Insurance companies are becoming increasingly stricter about requiring thorough documentation of lifestyle modifications in the patient's chart at each visit. This includes detailed records of diet recommendations (caloric intake, etc),  diagnosis codes for the medication, and an emphasis on the patient's commitment to continuing these efforts while on medication.  Without this additional documentation in the chart notes, a prior authorization will most likely be denied. Can not find diagnosis code and/ or chart notes for Zolpidem  10. Please advise

## 2024-04-05 ENCOUNTER — Telehealth: Payer: Self-pay

## 2024-04-05 ENCOUNTER — Other Ambulatory Visit

## 2024-04-05 ENCOUNTER — Encounter: Payer: Self-pay | Admitting: Internal Medicine

## 2024-04-05 ENCOUNTER — Other Ambulatory Visit (HOSPITAL_COMMUNITY): Payer: Self-pay

## 2024-04-05 DIAGNOSIS — K51919 Ulcerative colitis, unspecified with unspecified complications: Secondary | ICD-10-CM

## 2024-04-05 DIAGNOSIS — M5126 Other intervertebral disc displacement, lumbar region: Secondary | ICD-10-CM | POA: Diagnosis not present

## 2024-04-05 DIAGNOSIS — M5116 Intervertebral disc disorders with radiculopathy, lumbar region: Secondary | ICD-10-CM

## 2024-04-05 NOTE — Telephone Encounter (Signed)
 Pharmacy Patient Advocate Encounter  Received notification from [employer] that Prior Authorization for Roxicodone  5 has been APPROVED from 04/05/24 to 04/05/25. Ran test claim, Copay is $0.00. This test claim was processed through [facility]- copay amounts may vary at other pharmacies due to pharmacy/plan contracts, or as the patient moves through the different stages of their insurance plan.   PA #/Case ID/Reference #: [id number]

## 2024-04-05 NOTE — Addendum Note (Signed)
 Addended by: Yates Helm on: 04/05/2024 03:26 PM   Modules accepted: Orders

## 2024-04-05 NOTE — Telephone Encounter (Signed)
 Pharmacy Patient Advocate Encounter   Received notification from Patient Pharmacy that prior authorization for Roxicodone  5 is required/requested.   Insurance verification completed.   The patient is insured through [employer] .   Per test claim: PA required; PA submitted to above mentioned insurance via CoverMyMeds Key/confirmation #/EOC [id number] Status is pending. Out of pocket co-pay $21.37.

## 2024-04-06 ENCOUNTER — Ambulatory Visit: Admitting: Internal Medicine

## 2024-04-06 ENCOUNTER — Encounter: Payer: Self-pay | Admitting: Pediatrics

## 2024-04-06 ENCOUNTER — Other Ambulatory Visit: Payer: Self-pay | Admitting: Internal Medicine

## 2024-04-06 DIAGNOSIS — M5116 Intervertebral disc disorders with radiculopathy, lumbar region: Secondary | ICD-10-CM

## 2024-04-06 LAB — QUANTIFERON-TB GOLD PLUS
Mitogen-NIL: 8.84 [IU]/mL
NIL: 0.02 [IU]/mL
QuantiFERON-TB Gold Plus: NEGATIVE
TB1-NIL: 0 [IU]/mL
TB2-NIL: 0 [IU]/mL

## 2024-04-06 MED ORDER — GABAPENTIN 300 MG PO CAPS: 300.0000 mg | ORAL_CAPSULE | Freq: Three times a day (TID) | ORAL | 1 refills | Status: DC

## 2024-04-06 NOTE — Telephone Encounter (Signed)
 Can you please call the patient and find out what she is referring to when she says "another one"? Is it her medications that were just sent in??

## 2024-04-06 NOTE — Telephone Encounter (Signed)
 Patient is referring to refill request,  refill was resubmitted to speciality pharmacy.

## 2024-04-07 LAB — CALPROTECTIN, FECAL: Calprotectin, Fecal: 219 ug/g — ABNORMAL HIGH (ref 0–120)

## 2024-04-09 ENCOUNTER — Other Ambulatory Visit: Payer: Self-pay

## 2024-04-09 ENCOUNTER — Telehealth: Payer: Self-pay

## 2024-04-09 ENCOUNTER — Ambulatory Visit: Admitting: Physical Therapy

## 2024-04-09 ENCOUNTER — Encounter: Payer: Self-pay | Admitting: Pediatrics

## 2024-04-09 MED ORDER — ESOMEPRAZOLE MAGNESIUM 40 MG PO CPDR: 40.0000 mg | DELAYED_RELEASE_CAPSULE | Freq: Every day | ORAL | 3 refills | Status: DC

## 2024-04-09 MED ORDER — ZOLPIDEM TARTRATE 10 MG PO TABS
10.0000 mg | ORAL_TABLET | Freq: Every evening | ORAL | 1 refills | Status: DC | PRN
Start: 2024-04-19 — End: 2024-04-25

## 2024-04-09 NOTE — Telephone Encounter (Signed)
 I received the refill request of Zolpidem . This was refilled and sent to mail order pharmacy.

## 2024-04-09 NOTE — Telephone Encounter (Signed)
 Refill sent.

## 2024-04-09 NOTE — Telephone Encounter (Signed)
 Patient requested her Nexium  40 mg refills be sent to [facility] mail order as a 90 day supply.

## 2024-04-09 NOTE — Telephone Encounter (Signed)
 Pharmacy requesting [facility]

## 2024-04-10 ENCOUNTER — Other Ambulatory Visit (HOSPITAL_COMMUNITY): Payer: Self-pay

## 2024-04-11 ENCOUNTER — Other Ambulatory Visit (HOSPITAL_COMMUNITY): Payer: Self-pay

## 2024-04-11 ENCOUNTER — Telehealth: Payer: Self-pay

## 2024-04-11 ENCOUNTER — Ambulatory Visit: Admitting: Internal Medicine

## 2024-04-11 NOTE — Telephone Encounter (Signed)
 Pharmacy Patient Advocate Encounter   Received notification from CoverMyMeds that prior authorization for Zolpidem  Tartrate 10MG  tablets is required/requested.   Insurance verification completed.   The patient is insured through [facility] .   Per test claim: PA required; PA submitted to above mentioned insurance via CoverMyMeds Key/confirmation #/EOC [id number] Status is pending     Received notification from [facility] that Prior Authorization for Zolpidem  10mg  tabs has been APPROVED from 04/11/24 to 04/11/25   PA #/Case ID/Reference #: [id number]

## 2024-04-13 ENCOUNTER — Ambulatory Visit: Admitting: Physical Therapy

## 2024-04-13 DIAGNOSIS — M79604 Pain in right leg: Secondary | ICD-10-CM | POA: Diagnosis not present

## 2024-04-13 DIAGNOSIS — M5417 Radiculopathy, lumbosacral region: Secondary | ICD-10-CM

## 2024-04-13 DIAGNOSIS — M6281 Muscle weakness (generalized): Secondary | ICD-10-CM

## 2024-04-13 DIAGNOSIS — R29898 Other symptoms and signs involving the musculoskeletal system: Secondary | ICD-10-CM | POA: Diagnosis not present

## 2024-04-13 NOTE — Therapy (Signed)
 OUTPATIENT PHYSICAL THERAPY THORACOLUMBAR    Patient Name: Melissa Cohen MRN: [id number] DOB:[birth year], 58 y.o., female Today's Date: 04/13/2024  END OF SESSION:  PT End of Session - 04/13/24 0844     Visit Number 2    Number of Visits 13    Authorization Type [employer]    Authorization Time Period 04/03/24 to 05/15/24    PT Start Time 0843    PT Stop Time 0925    PT Time Calculation (min) 42 min             Past Medical History:  Diagnosis Date   Allergy    Anemia    Anxiety    Arthritis    Osteo arthritis - bil knees   Asthma    as a young adult related to allergy flare   Atypical nevi 08/25/2018   1. LEFT MID BACK (MILD) - NO TX, 2. RIGHT CHEST (SEVERE) - W/S   Atypical nevi 09/14/2018   MID BACK (SEVERE) - W/S   Atypical nevi 11/13/2018   LEFT POST. NECK INCIDENTRAL MOLE (MILD)   Atypical nevi 11/27/2018   RIGHT CHEST + MARGIN   Atypical nevus 01/02/2018   LEFT UPPER BACK (SEVERE) - W/S   Atypical nevus 01/04/2019   LEFT STERNUM ( MODERATE)   Atypical nevus 01/04/2019   LEFT POST NECK INF. (SEVERE)   Atypical nevus 01/04/2019   RECURRENT MID BACK - [facility] DR. PAVLIS   Back pain    Basal cell carcinoma 06/06/2008   LEFT UPPER SHOULDER PROX. @ [facility]   Blood transfusion without reported diagnosis    from [facility] several   Chest pain    Depression    Fatty liver    Gallbladder problem    Gastric ulcer    GERD (gastroesophageal reflux disease)    Heart murmur    Hyperlipidemia    Hypertension    Iron deficiency anemia    Joint pain    Knee pain    Lower extremity edema    Melanoma (HCC)    right cleavage, posterior left shoulder   MM (malignant melanoma of skin) (HCC) 09/14/2018   LEFT POST. NECK MIS - EXC   MM (malignant melanoma of skin) (HCC) 09/14/2018   RIGHT CHEST MIS - EXC   Obesity    Restless leg syndrome    Seizures (HCC)    2-3 d/t hypogylcermia - last seizure was 1998   Shortness of breath on exertion    Sleep  apnea    sleep study showed mild sleep apnea - no c-papa needed   Ulcerative colitis    Dx at age 3; had been 30 years since her last flareup and then had a flareup recently in summer of 2017. Was then started back on Remicade  and is doing well since.   Vitamin D  deficiency    Past Surgical History:  Procedure Laterality Date   APPENDECTOMY     BREAST ENHANCEMENT SURGERY  2004   CHOLECYSTECTOMY  2011   COLONOSCOPY  2019   LAPAROTOMY N/A 06/17/2023   Procedure: EXPLORATORY LAPAROTOMY, REPAIR OF COLOTOMY;  Surgeon: Joyce Nixon, MD;  Location: [facility];  Service: General;  Laterality: N/A;   right eye lasix     02/17/12   TONSILLECTOMY AND ADENOIDECTOMY     TUBAL LIGATION     UPPER GASTROINTESTINAL ENDOSCOPY     Uterine ablation     WISDOM TOOTH EXTRACTION     Patient Active Problem List  Diagnosis Date Noted   Lumbar disc herniation with radiculopathy 04/04/2024   Seizures (HCC)    Restless leg syndrome    Hyperlipidemia    Sleep apnea    Perforation of colon as colonoscopy complication (HCC) 06/17/2023   Low back pain 05/13/2021   Adhesive capsulitis of left shoulder 04/01/2021   Metabolic syndrome 02/08/2021   At risk for impaired metabolic function 12/09/2020   Polyphagia 11/04/2020   Mood disorder, with emotional eating 11/04/2020   At risk for activity intolerance 11/04/2020   Vasovagal near syncope 03/26/2020   Frequent falls 02/13/2020   HTN (hypertension) 02/13/2020   Systolic murmur 03/26/2017   Chronic rhinitis 03/17/2016   Menopausal syndrome (hot flushes) 08/23/2014   Screening for malignant neoplasm of cervix 02/25/2014   Anxiety and depression 10/08/2011   Disorder of sacrum 04/12/2011   TROCHANTERIC BURSITIS, BILATERAL 04/12/2011   Benign neoplasm of skin 07/14/2010   Cervical radiculopathy 07/14/2010   GERD 12/31/2009   CARCINOMA, BASAL CELL 03/14/2008   Vitamin D  deficiency 03/06/2008   HYPERTRIGLYCERIDEMIA 03/06/2008   Ulcerative colitis (HCC)  02/09/2007   INSOMNIA 02/09/2007    PCP: Gavin Kast FNP   REFERRING PROVIDER: Conard Decent, MD  REFERRING DIAG:  Diagnosis  M51.16 (ICD-10-CM) - Intervertebral disc disorders with radiculopathy, lumbar region    Rationale for Evaluation and Treatment: Rehabilitation  THERAPY DIAG:  Radiculopathy, lumbosacral region  Pain in right leg  Muscle weakness (generalized)  ONSET DATE: week before last (late April 2025)  SUBJECTIVE:                                                                                                                                                                                           SUBJECTIVE STATEMENT: stretches helping some. Inj set for 5/21  One of my discs I guess between L5-S1 is herniated and pressing on my sciatic nerve. Having a lot of pain and its hard. MD wanted to see if we could work on stretching or strengthening, he wanted to start with PT- no injections or anything like this yet. Worse when I first get up and gets better thru the day. Had this before but it went away quickly, this time it started going down my leg so I figured I needed to get seen. Supposed to be out of town for second half of June.    PERTINENT HISTORY:  See above   PAIN:  Are you having pain? Yes: NPRS scale: 2/10 Pain location: R LE  Pain description: aching in leg, radicular sx to mid calf today but can vary Aggravating factors: nothing, maybe walking on it  Relieving factors:  maybe walking shoes instead of barefoot   PRECAUTIONS: None  RED FLAGS: None   WEIGHT BEARING RESTRICTIONS: No  FALLS:  Has patient fallen in last 6 months? Yes. Number of falls 1- fell over some plants she pulled inside to protect from frost, no FOF   LIVING ENVIRONMENT: Lives with: lives with their partner Lives in: House/apartment   OCCUPATION: travel agent- lots of sitting, lots of computer work, no ergonomic set up   PLOF: Independent, Independent with  basic ADLs, Independent with gait, and Independent with transfers  PATIENT GOALS: feel better and quit hurting, strengthen   NEXT MD VISIT: Mina Alter July 8th  OBJECTIVE:  Note: Objective measures were completed at Evaluation unless otherwise noted.  DIAGNOSTIC FINDINGS:  CLINICAL DATA:  Lumbar radiculopathy. History of degenerative disc disease. Right-sided low back pain with right leg pain and foot coldness 4 days. No known injury or prior relevant surgery.   EXAM: MRI LUMBAR SPINE WITHOUT CONTRAST   TECHNIQUE: Multiplanar, multisequence MR imaging of the lumbar spine was performed. No intravenous contrast was administered.   COMPARISON:  Lumbar spine radiographs 03/24/2024. Abdominopelvic CTA 06/16/2023. Lumbar MRI 04/02/2021.   FINDINGS: Segmentation: Conventional anatomy assumed, with the last open disc space designated L5-S1.Concordant with prior imaging.   Alignment: Stable mild convex left scoliosis and mild chronic retrolisthesis at L2-3.   Vertebrae: No worrisome osseous lesion, acute fracture or pars defect. The visualized sacroiliac joints appear unremarkable.   Conus medullaris: Extends to the L1 level. The conus and cauda equina appear normal.   Paraspinal and other soft tissues: No significant paraspinal findings.   Disc levels:   Sagittal images demonstrate no significant disc space findings within the visualized lower thoracic spine.   L1-2: Stable mild loss of disc height with a chronic small left paracentral disc extrusion. Minimal mass effect on the thecal sac without foraminal narrowing or nerve root encroachment.   L2-3: Chronic loss of disc height with annular disc bulging and endplate osteophytes. Mild bilateral facet hypertrophy. Chronic mild lateral recess and foraminal narrowing bilaterally without nerve root encroachment. The spinal canal is patent.   L3-4: Chronic loss of disc height with annular disc bulging and endplate  osteophytes asymmetric to the right. New small disc protrusion in the right subarticular zone with similar mild chronic right lateral recess narrowing. Unchanged mild right foraminal narrowing without L3 nerve root encroachment. The spinal canal and left foramen are patent.   L4-5: Preserved disc height and hydration. Mild facet hypertrophy. No spinal stenosis or significant foraminal narrowing.   L5-S1: Mild loss of disc height. There is a new right-sided disc extrusion with caudal migration of a disc fragment into the right S1 lateral recess. There is mass effect on the right S1 nerve root. Mild facet and ligamentous hypertrophy without resulting foraminal narrowing or L5 nerve root encroachment. The spinal canal and left foramen are patent.   IMPRESSION: 1. New right-sided disc extrusion at L5-S1 with caudal migration of a disc fragment into the right S1 lateral recess and mass effect on the right S1 nerve root. 2. New small disc protrusion in the right subarticular zone at L3-4 with similar mild chronic right lateral recess and right foraminal narrowing. 3. Stable chronic mild lateral recess and foraminal narrowing bilaterally at L2-3. 4. No acute osseous findings or significant central stenosis.      COGNITION: Overall cognitive status: Within functional limits for tasks assessed     SENSATION: Not tested  MUSCLE LENGTH:  L HS and piriformis  Min limitation R HS and piriformis Mod limitation  Sciatic nerve R very tense and immobile, (+) tension test   L LE significantly shorter than R, hx of scoliosis   POSTURE: rounded shoulders and forward head  PALPATION:  Lateral R thigh tender, R piriformis tender   LUMBAR ROM:   AROM eval  Flexion Mod limitation, increased pain   Extension WNL, REIS some centralization   Right lateral flexion WNL   Left lateral flexion WNL   Right rotation   Left rotation    (Blank rows = not tested)    LOWER EXTREMITY MMT:     MMT Right eval Left eval  Hip flexion 3+ 3+  Hip extension    Hip abduction 3 4  Hip adduction    Hip internal rotation    Hip external rotation 2+ 4-  Knee flexion    Knee extension    Ankle dorsiflexion    Ankle plantarflexion    Ankle inversion    Ankle eversion     (Blank rows = not tested)   TREATMENT DATE:   04/13/24 PROM and stretching LEs and trunk NT stretching RT LE Core stab ex- feet on ball bridge,KTC,obl and iso abdominals Add ball squeeze,clams DL and SL with red tband , marching red tband SLR 10 x BIL SLR with abd 10 x BIL Mech lumb traction 15 min static 60# Discussed REIS to relief symptoms   04/03/24  Eval, HEP, POC                                                                                                                                   PATIENT EDUCATION:  Education details: exam findings, anatomy of region and how extension directional preference works/how movement affects her pain, POC, HEP  Person educated: Patient Education method: Explanation, Demonstration, and Handouts Education comprehension: verbalized understanding, returned demonstration, and needs further education  HOME EXERCISE PROGRAM: Access Code: [email or web address] URL: [email or web address] Date: 04/03/2024 Prepared by: Terrel Ferries  Exercises - Standing Lumbar Extension at Wall - Forearms  - 2-3 x daily - 7 x weekly - 1 sets - 10 reps - Hooklying Hamstring Stretch with Strap  - 2-3 x daily - 7 x weekly - 1 sets - 3 reps - 30 seconds  hold - Supine Transversus Abdominis Bracing - Hands on Ground  - 5 x daily - 7 x weekly - 1 sets - 10 reps - 5 seconds  hold  ASSESSMENT:  CLINICAL IMPRESSION: pt arrives for 1st visit since eval,states stretching are giving some relief. Inj set for next week. Pt is tight in RT LE and positive NT. Initiated core strengthening with cuing needed for stab and some increased pain but tolerable. Discussed trying REIS to relief  symptoms. Lumb traction trial to see if helps,will assess at next session.    Patient is a 58 y.o. F who was seen today for physical therapy evaluation and treatment for  Diagnosis  M51.16 (ICD-10-CM) - Intervertebral disc disorders with radiculopathy, lumbar region  . She was late to eval, so assessment was a bit limited. She did respond well to extension based movements, does have a lot of mm weakness in core and hips, also s/p abdominal surgery which is definitely causing some core weakness. Anticipate she will respond well to PT.   OBJECTIVE IMPAIRMENTS: decreased mobility, decreased ROM, decreased strength, hypomobility, increased fascial restrictions, increased muscle spasms, impaired flexibility, impaired sensation, improper body mechanics, postural dysfunction, and pain.   ACTIVITY LIMITATIONS: carrying, lifting, bending, and locomotion level  PARTICIPATION LIMITATIONS: meal prep, cleaning, laundry, shopping, community activity, occupation, and yard work  PERSONAL FACTORS: Age, Fitness, Past/current experiences, Social background, and Time since onset of injury/illness/exacerbation are also affecting patient's functional outcome.   REHAB POTENTIAL: Good  CLINICAL DECISION MAKING: Stable/uncomplicated  EVALUATION COMPLEXITY: Low   GOALS: Goals reviewed with patient? No  SHORT TERM GOALS: Target date: 04/24/2024    Will be compliant with appropriate progressive HEP GOAL STATUS: progressing 04/13/24   2. Will demonstrate improved postural awareness with all functional tasks, use of ergonomic aides PRN/as desired GOAL STATUS: progressing 04/13/24   3. Will demonstrate good functional biomechanics for bed mobility and floor to waist lifting mechanics    LONG TERM GOALS: Target date: 05/15/2024   MMT to have improved by one grade all weak groups GOAL STATUS: Initial  2. Mm flexibility and spasms to have improved by at least 50% in order to improve functional movement  patterns and for pain control GOAL STATUS: Initial  3. Pain to be no more than 2/10 with all functional activities GOAL STATUS: Initial   4. Radicular symptoms to have resolved by at least 75%      .  PLAN:  PT FREQUENCY: 2x/week  PT DURATION: 6 weeks  PLANNED INTERVENTIONS: 97750- Physical Performance Testing, 97110-Therapeutic exercises, 97530- Therapeutic activity, [id number]- Neuromuscular re-education, 97535- Self Care, 60454- Manual therapy, [id number]- Aquatic Therapy, [id number]- Traction (mechanical), Taping, and Dry Needling.  PLAN FOR NEXT SESSION: extension directional preference- progress as appropriate, work on strength and biomechanics, flexibility . Assess traction benefits  Patient Details  Name: TINSLEIGH LARANCE MRN: [id number] Date of Birth: May 18, 1966 Referring Provider:  Gavin Kast, FNP  Encounter Date: 04/13/2024   Aquilla Bayley, PTA 04/13/2024, 8:44 AM  [facility] [address] [place], [place], [address] Phone: [phone]   Fax:  [phone]

## 2024-04-14 ENCOUNTER — Other Ambulatory Visit (HOSPITAL_BASED_OUTPATIENT_CLINIC_OR_DEPARTMENT_OTHER): Payer: Self-pay | Admitting: Obstetrics and Gynecology

## 2024-04-14 DIAGNOSIS — N951 Menopausal and female climacteric states: Secondary | ICD-10-CM

## 2024-04-16 DIAGNOSIS — M1990 Unspecified osteoarthritis, unspecified site: Secondary | ICD-10-CM | POA: Diagnosis not present

## 2024-04-16 DIAGNOSIS — Z809 Family history of malignant neoplasm, unspecified: Secondary | ICD-10-CM | POA: Diagnosis not present

## 2024-04-16 DIAGNOSIS — G4700 Insomnia, unspecified: Secondary | ICD-10-CM | POA: Diagnosis not present

## 2024-04-16 DIAGNOSIS — Z8249 Family history of ischemic heart disease and other diseases of the circulatory system: Secondary | ICD-10-CM | POA: Diagnosis not present

## 2024-04-16 DIAGNOSIS — D84821 Immunodeficiency due to drugs: Secondary | ICD-10-CM | POA: Diagnosis not present

## 2024-04-16 DIAGNOSIS — F419 Anxiety disorder, unspecified: Secondary | ICD-10-CM | POA: Diagnosis not present

## 2024-04-16 DIAGNOSIS — K219 Gastro-esophageal reflux disease without esophagitis: Secondary | ICD-10-CM | POA: Diagnosis not present

## 2024-04-16 DIAGNOSIS — I739 Peripheral vascular disease, unspecified: Secondary | ICD-10-CM | POA: Diagnosis not present

## 2024-04-16 DIAGNOSIS — E785 Hyperlipidemia, unspecified: Secondary | ICD-10-CM | POA: Diagnosis not present

## 2024-04-16 DIAGNOSIS — Z87891 Personal history of nicotine dependence: Secondary | ICD-10-CM | POA: Diagnosis not present

## 2024-04-16 DIAGNOSIS — Z833 Family history of diabetes mellitus: Secondary | ICD-10-CM | POA: Diagnosis not present

## 2024-04-16 DIAGNOSIS — G629 Polyneuropathy, unspecified: Secondary | ICD-10-CM | POA: Diagnosis not present

## 2024-04-17 ENCOUNTER — Other Ambulatory Visit: Payer: Self-pay

## 2024-04-17 ENCOUNTER — Ambulatory Visit: Admitting: Physical Therapy

## 2024-04-17 DIAGNOSIS — M5417 Radiculopathy, lumbosacral region: Secondary | ICD-10-CM

## 2024-04-17 DIAGNOSIS — M79604 Pain in right leg: Secondary | ICD-10-CM

## 2024-04-17 DIAGNOSIS — M6281 Muscle weakness (generalized): Secondary | ICD-10-CM

## 2024-04-17 DIAGNOSIS — R29898 Other symptoms and signs involving the musculoskeletal system: Secondary | ICD-10-CM | POA: Diagnosis not present

## 2024-04-17 DIAGNOSIS — M5116 Intervertebral disc disorders with radiculopathy, lumbar region: Secondary | ICD-10-CM

## 2024-04-17 MED ORDER — GABAPENTIN 300 MG PO CAPS: 300.0000 mg | ORAL_CAPSULE | Freq: Three times a day (TID) | ORAL | 1 refills | Status: DC

## 2024-04-17 NOTE — Therapy (Signed)
 OUTPATIENT PHYSICAL THERAPY THORACOLUMBAR    Patient Name: Melissa Cohen MRN: [id number] DOB:[birth year], 58 y.o., female Today's Date: 04/17/2024  END OF SESSION:  PT End of Session - 04/17/24 1138     Visit Number 3    Number of Visits 13    Date for PT Re-Evaluation 05/15/24    Authorization Type [employer]    Authorization Time Period 04/03/24 to 05/15/24    PT Start Time 1140    PT Stop Time 1225    PT Time Calculation (min) 45 min             Past Medical History:  Diagnosis Date   Allergy    Anemia    Anxiety    Arthritis    Osteo arthritis - bil knees   Asthma    as a young adult related to allergy flare   Atypical nevi 08/25/2018   1. LEFT MID BACK (MILD) - NO TX, 2. RIGHT CHEST (SEVERE) - W/S   Atypical nevi 09/14/2018   MID BACK (SEVERE) - W/S   Atypical nevi 11/13/2018   LEFT POST. NECK INCIDENTRAL MOLE (MILD)   Atypical nevi 11/27/2018   RIGHT CHEST + MARGIN   Atypical nevus 01/02/2018   LEFT UPPER BACK (SEVERE) - W/S   Atypical nevus 01/04/2019   LEFT STERNUM ( MODERATE)   Atypical nevus 01/04/2019   LEFT POST NECK INF. (SEVERE)   Atypical nevus 01/04/2019   RECURRENT MID BACK - [facility] DR. PAVLIS   Back pain    Basal cell carcinoma 06/06/2008   LEFT UPPER SHOULDER PROX. @ [facility]   Blood transfusion without reported diagnosis    from [facility] several   Chest pain    Depression    Fatty liver    Gallbladder problem    Gastric ulcer    GERD (gastroesophageal reflux disease)    Heart murmur    Hyperlipidemia    Hypertension    Iron deficiency anemia    Joint pain    Knee pain    Lower extremity edema    Melanoma (HCC)    right cleavage, posterior left shoulder   MM (malignant melanoma of skin) (HCC) 09/14/2018   LEFT POST. NECK MIS - EXC   MM (malignant melanoma of skin) (HCC) 09/14/2018   RIGHT CHEST MIS - EXC   Obesity    Restless leg syndrome    Seizures (HCC)    2-3 d/t hypogylcermia - last seizure was 1998    Shortness of breath on exertion    Sleep apnea    sleep study showed mild sleep apnea - no c-papa needed   Ulcerative colitis    Dx at age 20; had been 30 years since her last flareup and then had a flareup recently in summer of 2017. Was then started back on Remicade  and is doing well since.   Vitamin D  deficiency    Past Surgical History:  Procedure Laterality Date   APPENDECTOMY     BREAST ENHANCEMENT SURGERY  2004   CHOLECYSTECTOMY  2011   COLONOSCOPY  2019   LAPAROTOMY N/A 06/17/2023   Procedure: EXPLORATORY LAPAROTOMY, REPAIR OF COLOTOMY;  Surgeon: Joyce Nixon, MD;  Location: [facility];  Service: General;  Laterality: N/A;   right eye lasix     02/17/12   TONSILLECTOMY AND ADENOIDECTOMY     TUBAL LIGATION     UPPER GASTROINTESTINAL ENDOSCOPY     Uterine ablation     WISDOM TOOTH EXTRACTION  Patient Active Problem List   Diagnosis Date Noted   Lumbar disc herniation with radiculopathy 04/04/2024   Seizures (HCC)    Restless leg syndrome    Hyperlipidemia    Sleep apnea    Perforation of colon as colonoscopy complication (HCC) 06/17/2023   Low back pain 05/13/2021   Adhesive capsulitis of left shoulder 04/01/2021   Metabolic syndrome 02/08/2021   At risk for impaired metabolic function 12/09/2020   Polyphagia 11/04/2020   Mood disorder, with emotional eating 11/04/2020   At risk for activity intolerance 11/04/2020   Vasovagal near syncope 03/26/2020   Frequent falls 02/13/2020   HTN (hypertension) 02/13/2020   Systolic murmur 03/26/2017   Chronic rhinitis 03/17/2016   Menopausal syndrome (hot flushes) 08/23/2014   Screening for malignant neoplasm of cervix 02/25/2014   Anxiety and depression 10/08/2011   Disorder of sacrum 04/12/2011   TROCHANTERIC BURSITIS, BILATERAL 04/12/2011   Benign neoplasm of skin 07/14/2010   Cervical radiculopathy 07/14/2010   GERD 12/31/2009   CARCINOMA, BASAL CELL 03/14/2008   Vitamin D  deficiency 03/06/2008   HYPERTRIGLYCERIDEMIA  03/06/2008   Ulcerative colitis (HCC) 02/09/2007   INSOMNIA 02/09/2007    PCP: Gavin Kast FNP   REFERRING PROVIDER: Conard Decent, MD  REFERRING DIAG:  Diagnosis  M51.16 (ICD-10-CM) - Intervertebral disc disorders with radiculopathy, lumbar region    Rationale for Evaluation and Treatment: Rehabilitation  THERAPY DIAG:  Radiculopathy, lumbosacral region  Pain in right leg  Muscle weakness (generalized)  ONSET DATE: week before last (late April 2025)  SUBJECTIVE:                                                                                                                                                                                           SUBJECTIVE STATEMENT: back felt better from traction. Extension also gives relief of radiating symptoms.Sprained RT ankle  One of my discs I guess between L5-S1 is herniated and pressing on my sciatic nerve. Having a lot of pain and its hard. MD wanted to see if we could work on stretching or strengthening, he wanted to start with PT- no injections or anything like this yet. Worse when I first get up and gets better thru the day. Had this before but it went away quickly, this time it started going down my leg so I figured I needed to get seen. Supposed to be out of town for second half of June.    PERTINENT HISTORY:  See above   PAIN:  Are you having pain? Yes: NPRS scale: 2/10 Pain location: R LE  Pain description: aching in leg, radicular sx to mid calf today  but can vary Aggravating factors: nothing, maybe walking on it  Relieving factors: maybe walking shoes instead of barefoot   PRECAUTIONS: None  RED FLAGS: None   WEIGHT BEARING RESTRICTIONS: No  FALLS:  Has patient fallen in last 6 months? Yes. Number of falls 1- fell over some plants she pulled inside to protect from frost, no FOF   LIVING ENVIRONMENT: Lives with: lives with their partner Lives in: House/apartment   OCCUPATION: travel agent- lots  of sitting, lots of computer work, no ergonomic set up   PLOF: Independent, Independent with basic ADLs, Independent with gait, and Independent with transfers  PATIENT GOALS: feel better and quit hurting, strengthen   NEXT MD VISIT: Mina Alter July 8th  OBJECTIVE:  Note: Objective measures were completed at Evaluation unless otherwise noted.  DIAGNOSTIC FINDINGS:  CLINICAL DATA:  Lumbar radiculopathy. History of degenerative disc disease. Right-sided low back pain with right leg pain and foot coldness 4 days. No known injury or prior relevant surgery.   EXAM: MRI LUMBAR SPINE WITHOUT CONTRAST   TECHNIQUE: Multiplanar, multisequence MR imaging of the lumbar spine was performed. No intravenous contrast was administered.   COMPARISON:  Lumbar spine radiographs 03/24/2024. Abdominopelvic CTA 06/16/2023. Lumbar MRI 04/02/2021.   FINDINGS: Segmentation: Conventional anatomy assumed, with the last open disc space designated L5-S1.Concordant with prior imaging.   Alignment: Stable mild convex left scoliosis and mild chronic retrolisthesis at L2-3.   Vertebrae: No worrisome osseous lesion, acute fracture or pars defect. The visualized sacroiliac joints appear unremarkable.   Conus medullaris: Extends to the L1 level. The conus and cauda equina appear normal.   Paraspinal and other soft tissues: No significant paraspinal findings.   Disc levels:   Sagittal images demonstrate no significant disc space findings within the visualized lower thoracic spine.   L1-2: Stable mild loss of disc height with a chronic small left paracentral disc extrusion. Minimal mass effect on the thecal sac without foraminal narrowing or nerve root encroachment.   L2-3: Chronic loss of disc height with annular disc bulging and endplate osteophytes. Mild bilateral facet hypertrophy. Chronic mild lateral recess and foraminal narrowing bilaterally without nerve root encroachment. The spinal canal is  patent.   L3-4: Chronic loss of disc height with annular disc bulging and endplate osteophytes asymmetric to the right. New small disc protrusion in the right subarticular zone with similar mild chronic right lateral recess narrowing. Unchanged mild right foraminal narrowing without L3 nerve root encroachment. The spinal canal and left foramen are patent.   L4-5: Preserved disc height and hydration. Mild facet hypertrophy. No spinal stenosis or significant foraminal narrowing.   L5-S1: Mild loss of disc height. There is a new right-sided disc extrusion with caudal migration of a disc fragment into the right S1 lateral recess. There is mass effect on the right S1 nerve root. Mild facet and ligamentous hypertrophy without resulting foraminal narrowing or L5 nerve root encroachment. The spinal canal and left foramen are patent.   IMPRESSION: 1. New right-sided disc extrusion at L5-S1 with caudal migration of a disc fragment into the right S1 lateral recess and mass effect on the right S1 nerve root. 2. New small disc protrusion in the right subarticular zone at L3-4 with similar mild chronic right lateral recess and right foraminal narrowing. 3. Stable chronic mild lateral recess and foraminal narrowing bilaterally at L2-3. 4. No acute osseous findings or significant central stenosis.      COGNITION: Overall cognitive status: Within functional limits for tasks assessed  SENSATION: Not tested  MUSCLE LENGTH:  L HS and piriformis Min limitation R HS and piriformis Mod limitation  Sciatic nerve R very tense and immobile, (+) tension test   L LE significantly shorter than R, hx of scoliosis   POSTURE: rounded shoulders and forward head  PALPATION:  Lateral R thigh tender, R piriformis tender   LUMBAR ROM:   AROM eval  Flexion Mod limitation, increased pain   Extension WNL, REIS some centralization   Right lateral flexion WNL   Left lateral flexion WNL   Right  rotation   Left rotation    (Blank rows = not tested)    LOWER EXTREMITY MMT:    MMT Right eval Left eval  Hip flexion 3+ 3+  Hip extension    Hip abduction 3 4  Hip adduction    Hip internal rotation    Hip external rotation 2+ 4-  Knee flexion    Knee extension    Ankle dorsiflexion    Ankle plantarflexion    Ankle inversion    Ankle eversion     (Blank rows = not tested)   TREATMENT DATE:   04/17/24 Checked Rt ankle, lateral swelling over lateral malleolus with slight discoloration , good ROM and MMT,tender laterally as expected Sitting on dyna disc 4 way pelvic ROM 15 x each Sitting on dyna disc hip flex and hip abd Red tband shld ext,row and ER on dyna disc 10 x each Core stab ex- feet on ball bridge,KTC,obl and iso abdominals Add ball squeeze,clams DL and SL with green tband , marching green tband SLR 10 x BIL SLR with abd 10 x BIL Mech lumb traction 15 min static 65#   04/13/24 PROM and stretching LEs and trunk NT stretching RT LE Core stab ex- feet on ball bridge,KTC,obl and iso abdominals Add ball squeeze,clams DL and SL with red tband , marching red tband SLR 10 x BIL SLR with abd 10 x BIL Mech lumb traction 15 min static 60# Discussed REIS to relief symptoms   04/03/24  Eval, HEP, POC                                                                                                                                   PATIENT EDUCATION:  Education details: exam findings, anatomy of region and how extension directional preference works/how movement affects her pain, POC, HEP  Person educated: Patient Education method: Explanation, Demonstration, and Handouts Education comprehension: verbalized understanding, returned demonstration, and needs further education  HOME EXERCISE PROGRAM: Access Code: [email or web address] URL: [email or web address] Date: 04/03/2024 Prepared by: Terrel Ferries  Exercises - Standing Lumbar Extension at Wall - Forearms   - 2-3 x daily - 7 x weekly - 1 sets - 10 reps - Hooklying Hamstring Stretch with Strap  - 2-3 x daily - 7 x weekly - 1 sets - 3 reps - 30 seconds  hold - Supine Transversus Abdominis Bracing -  Hands on Ground  - 5 x daily - 7 x weekly - 1 sets - 10 reps - 5 seconds  hold  ASSESSMENT:  CLINICAL IMPRESSION: pt arrives stating relief of radiating symptoms with extension. Stated thought traction helped. Inj for tomorrow cxl d/t insurance denial. Progressed core stab with some pain and fatigue . No wt bearing ex as pt arrived with sprained ankle.  Patient is a 58 y.o. F who was seen today for physical therapy evaluation and treatment for  Diagnosis  M51.16 (ICD-10-CM) - Intervertebral disc disorders with radiculopathy, lumbar region  . She was late to eval, so assessment was a bit limited. She did respond well to extension based movements, does have a lot of mm weakness in core and hips, also s/p abdominal surgery which is definitely causing some core weakness. Anticipate she will respond well to PT.   OBJECTIVE IMPAIRMENTS: decreased mobility, decreased ROM, decreased strength, hypomobility, increased fascial restrictions, increased muscle spasms, impaired flexibility, impaired sensation, improper body mechanics, postural dysfunction, and pain.   ACTIVITY LIMITATIONS: carrying, lifting, bending, and locomotion level  PARTICIPATION LIMITATIONS: meal prep, cleaning, laundry, shopping, community activity, occupation, and yard work  PERSONAL FACTORS: Age, Fitness, Past/current experiences, Social background, and Time since onset of injury/illness/exacerbation are also affecting patient's functional outcome.   REHAB POTENTIAL: Good  CLINICAL DECISION MAKING: Stable/uncomplicated  EVALUATION COMPLEXITY: Low   GOALS: Goals reviewed with patient? No  SHORT TERM GOALS: Target date: 04/24/2024    Will be compliant with appropriate progressive HEP GOAL STATUS: progressing 04/13/24   2. Will  demonstrate improved postural awareness with all functional tasks, use of ergonomic aides PRN/as desired GOAL STATUS: progressing 04/13/24   3. Will demonstrate good functional biomechanics for bed mobility and floor to waist lifting mechanics    LONG TERM GOALS: Target date: 05/15/2024   MMT to have improved by one grade all weak groups GOAL STATUS: Initial  2. Mm flexibility and spasms to have improved by at least 50% in order to improve functional movement patterns and for pain control GOAL STATUS: Initial  3. Pain to be no more than 2/10 with all functional activities GOAL STATUS: Initial   4. Radicular symptoms to have resolved by at least 75%      .  PLAN:  PT FREQUENCY: 2x/week  PT DURATION: 6 weeks  PLANNED INTERVENTIONS: 97750- Physical Performance Testing, 97110-Therapeutic exercises, 97530- Therapeutic activity, [id number]- Neuromuscular re-education, 97535- Self Care, [id number]- Manual therapy, [id number]- Aquatic Therapy, [id number]- Traction (mechanical), Taping, and Dry Needling.  PLAN FOR NEXT SESSION: extension directional preference- progress as appropriate, work on strength and biomechanics, flexibility . Assess traction benefits  Patient Details  Name: TALEYAH HILLMAN MRN: [id number] Date of Birth: 05/08/1966 Referring Provider:  Gavin Kast, FNP  Encounter Date: 04/17/2024   Aquilla Bayley, PTA 04/17/2024, 11:39 AM  [facility] [address] [place], [place], [address] Phone: [phone]   Fax:  [phone] [address] [place], [place], [address] Phone: [phone]   Fax:  [phone]

## 2024-04-18 DIAGNOSIS — M47816 Spondylosis without myelopathy or radiculopathy, lumbar region: Secondary | ICD-10-CM | POA: Diagnosis not present

## 2024-04-19 ENCOUNTER — Ambulatory Visit: Admitting: Physical Therapy

## 2024-04-20 ENCOUNTER — Telehealth: Payer: Self-pay | Admitting: Internal Medicine

## 2024-04-20 NOTE — Telephone Encounter (Signed)
 Archibald Beard from [facility] called and need DEA # to prescribe the patient zolpidem  (AMBIEN ) 10 MG tablet  cardiovascular # is [phone] option 2 and the ref # is [id number]

## 2024-04-24 ENCOUNTER — Ambulatory Visit: Admitting: Physical Therapy

## 2024-04-24 ENCOUNTER — Telehealth: Payer: Self-pay

## 2024-04-24 DIAGNOSIS — M5417 Radiculopathy, lumbosacral region: Secondary | ICD-10-CM | POA: Diagnosis not present

## 2024-04-24 DIAGNOSIS — M79604 Pain in right leg: Secondary | ICD-10-CM | POA: Diagnosis not present

## 2024-04-24 DIAGNOSIS — M6281 Muscle weakness (generalized): Secondary | ICD-10-CM

## 2024-04-24 DIAGNOSIS — R29898 Other symptoms and signs involving the musculoskeletal system: Secondary | ICD-10-CM | POA: Diagnosis not present

## 2024-04-24 NOTE — Therapy (Signed)
 OUTPATIENT PHYSICAL THERAPY THORACOLUMBAR    Patient Name: Melissa Cohen MRN: [id number] DOB:1966/01/11, 58 y.o., female Today's Date: 04/24/2024  END OF SESSION:  PT End of Session - 04/24/24 1056     Visit Number 4    Number of Visits 13    Date for PT Re-Evaluation 05/15/24    Authorization Type [employer]    Authorization Time Period 04/03/24 to 05/15/24    PT Start Time 1055    PT Stop Time 1140    PT Time Calculation (min) 45 min             Past Medical History:  Diagnosis Date   Allergy    Anemia    Anxiety    Arthritis    Osteo arthritis - bil knees   Asthma    as a young adult related to allergy flare   Atypical nevi 08/25/2018   1. LEFT MID BACK (MILD) - NO TX, 2. RIGHT CHEST (SEVERE) - W/S   Atypical nevi 09/14/2018   MID BACK (SEVERE) - W/S   Atypical nevi 11/13/2018   LEFT POST. NECK INCIDENTRAL MOLE (MILD)   Atypical nevi 11/27/2018   RIGHT CHEST + MARGIN   Atypical nevus 01/02/2018   LEFT UPPER BACK (SEVERE) - W/S   Atypical nevus 01/04/2019   LEFT STERNUM ( MODERATE)   Atypical nevus 01/04/2019   LEFT POST NECK INF. (SEVERE)   Atypical nevus 01/04/2019   RECURRENT MID BACK - [facility] DR. PAVLIS   Back pain    Basal cell carcinoma 06/06/2008   LEFT UPPER SHOULDER PROX. @ [facility]   Blood transfusion without reported diagnosis    from [facility] several   Chest pain    Depression    Fatty liver    Gallbladder problem    Gastric ulcer    GERD (gastroesophageal reflux disease)    Heart murmur    Hyperlipidemia    Hypertension    Iron deficiency anemia    Joint pain    Knee pain    Lower extremity edema    Melanoma (HCC)    right cleavage, posterior left shoulder   MM (malignant melanoma of skin) (HCC) 09/14/2018   LEFT POST. NECK MIS - EXC   MM (malignant melanoma of skin) (HCC) 09/14/2018   RIGHT CHEST MIS - EXC   Obesity    Restless leg syndrome    Seizures (HCC)    2-3 d/t hypogylcermia - last seizure was 1998    Shortness of breath on exertion    Sleep apnea    sleep study showed mild sleep apnea - no c-papa needed   Ulcerative colitis    Dx at age 89; had been 30 years since her last flareup and then had a flareup recently in summer of 2017. Was then started back on Remicade  and is doing well since.   Vitamin D  deficiency    Past Surgical History:  Procedure Laterality Date   APPENDECTOMY     BREAST ENHANCEMENT SURGERY  2004   CHOLECYSTECTOMY  2011   COLONOSCOPY  2019   LAPAROTOMY N/A 06/17/2023   Procedure: EXPLORATORY LAPAROTOMY, REPAIR OF COLOTOMY;  Surgeon: Joyce Nixon, MD;  Location: [facility];  Service: General;  Laterality: N/A;   right eye lasix     02/17/12   TONSILLECTOMY AND ADENOIDECTOMY     TUBAL LIGATION     UPPER GASTROINTESTINAL ENDOSCOPY     Uterine ablation     WISDOM TOOTH EXTRACTION  Patient Active Problem List   Diagnosis Date Noted   Lumbar disc herniation with radiculopathy 04/04/2024   Seizures (HCC)    Restless leg syndrome    Hyperlipidemia    Sleep apnea    Perforation of colon as colonoscopy complication (HCC) 06/17/2023   Low back pain 05/13/2021   Adhesive capsulitis of left shoulder 04/01/2021   Metabolic syndrome 02/08/2021   At risk for impaired metabolic function 12/09/2020   Polyphagia 11/04/2020   Mood disorder, with emotional eating 11/04/2020   At risk for activity intolerance 11/04/2020   Vasovagal near syncope 03/26/2020   Frequent falls 02/13/2020   HTN (hypertension) 02/13/2020   Systolic murmur 03/26/2017   Chronic rhinitis 03/17/2016   Menopausal syndrome (hot flushes) 08/23/2014   Screening for malignant neoplasm of cervix 02/25/2014   Anxiety and depression 10/08/2011   Disorder of sacrum 04/12/2011   TROCHANTERIC BURSITIS, BILATERAL 04/12/2011   Benign neoplasm of skin 07/14/2010   Cervical radiculopathy 07/14/2010   GERD 12/31/2009   CARCINOMA, BASAL CELL 03/14/2008   Vitamin D  deficiency 03/06/2008   HYPERTRIGLYCERIDEMIA  03/06/2008   Ulcerative colitis (HCC) 02/09/2007   INSOMNIA 02/09/2007    PCP: Gavin Kast FNP   REFERRING PROVIDER: Conard Decent, MD  REFERRING DIAG:  Diagnosis  M51.16 (ICD-10-CM) - Intervertebral disc disorders with radiculopathy, lumbar region    Rationale for Evaluation and Treatment: Rehabilitation  THERAPY DIAG:  Muscle weakness (generalized)  Radiculopathy, lumbosacral region  ONSET DATE: week before last (late April 2025)  SUBJECTIVE:                                                                                                                                                                                           SUBJECTIVE STATEMENT: inj ended up getting approved so had in last week and NO pain back or leg. RT ankle from sprain still a bit sore  One of my discs I guess between L5-S1 is herniated and pressing on my sciatic nerve. Having a lot of pain and its hard. MD wanted to see if we could work on stretching or strengthening, he wanted to start with PT- no injections or anything like this yet. Worse when I first get up and gets better thru the day. Had this before but it went away quickly, this time it started going down my leg so I figured I needed to get seen. Supposed to be out of town for second half of June.    PERTINENT HISTORY:  See above   PAIN:  Are you having pain? Yes: NPRS scale: 2/10 Pain location: R LE  Pain description: aching in leg, radicular  sx to mid calf today but can vary Aggravating factors: nothing, maybe walking on it  Relieving factors: maybe walking shoes instead of barefoot   PRECAUTIONS: None  RED FLAGS: None   WEIGHT BEARING RESTRICTIONS: No  FALLS:  Has patient fallen in last 6 months? Yes. Number of falls 1- fell over some plants she pulled inside to protect from frost, no FOF   LIVING ENVIRONMENT: Lives with: lives with their partner Lives in: House/apartment   OCCUPATION: travel agent- lots of  sitting, lots of computer work, no ergonomic set up   PLOF: Independent, Independent with basic ADLs, Independent with gait, and Independent with transfers  PATIENT GOALS: feel better and quit hurting, strengthen   NEXT MD VISIT: Mina Alter July 8th  OBJECTIVE:  Note: Objective measures were completed at Evaluation unless otherwise noted.  DIAGNOSTIC FINDINGS:  CLINICAL DATA:  Lumbar radiculopathy. History of degenerative disc disease. Right-sided low back pain with right leg pain and foot coldness 4 days. No known injury or prior relevant surgery.   EXAM: MRI LUMBAR SPINE WITHOUT CONTRAST   TECHNIQUE: Multiplanar, multisequence MR imaging of the lumbar spine was performed. No intravenous contrast was administered.   COMPARISON:  Lumbar spine radiographs 03/24/2024. Abdominopelvic CTA 06/16/2023. Lumbar MRI 04/02/2021.   FINDINGS: Segmentation: Conventional anatomy assumed, with the last open disc space designated L5-S1.Concordant with prior imaging.   Alignment: Stable mild convex left scoliosis and mild chronic retrolisthesis at L2-3.   Vertebrae: No worrisome osseous lesion, acute fracture or pars defect. The visualized sacroiliac joints appear unremarkable.   Conus medullaris: Extends to the L1 level. The conus and cauda equina appear normal.   Paraspinal and other soft tissues: No significant paraspinal findings.   Disc levels:   Sagittal images demonstrate no significant disc space findings within the visualized lower thoracic spine.   L1-2: Stable mild loss of disc height with a chronic small left paracentral disc extrusion. Minimal mass effect on the thecal sac without foraminal narrowing or nerve root encroachment.   L2-3: Chronic loss of disc height with annular disc bulging and endplate osteophytes. Mild bilateral facet hypertrophy. Chronic mild lateral recess and foraminal narrowing bilaterally without nerve root encroachment. The spinal canal is  patent.   L3-4: Chronic loss of disc height with annular disc bulging and endplate osteophytes asymmetric to the right. New small disc protrusion in the right subarticular zone with similar mild chronic right lateral recess narrowing. Unchanged mild right foraminal narrowing without L3 nerve root encroachment. The spinal canal and left foramen are patent.   L4-5: Preserved disc height and hydration. Mild facet hypertrophy. No spinal stenosis or significant foraminal narrowing.   L5-S1: Mild loss of disc height. There is a new right-sided disc extrusion with caudal migration of a disc fragment into the right S1 lateral recess. There is mass effect on the right S1 nerve root. Mild facet and ligamentous hypertrophy without resulting foraminal narrowing or L5 nerve root encroachment. The spinal canal and left foramen are patent.   IMPRESSION: 1. New right-sided disc extrusion at L5-S1 with caudal migration of a disc fragment into the right S1 lateral recess and mass effect on the right S1 nerve root. 2. New small disc protrusion in the right subarticular zone at L3-4 with similar mild chronic right lateral recess and right foraminal narrowing. 3. Stable chronic mild lateral recess and foraminal narrowing bilaterally at L2-3. 4. No acute osseous findings or significant central stenosis.      COGNITION: Overall cognitive status: Within functional limits  for tasks assessed     SENSATION: Not tested  MUSCLE LENGTH:  L HS and piriformis Min limitation R HS and piriformis Mod limitation  Sciatic nerve R very tense and immobile, (+) tension test   L LE significantly shorter than R, hx of scoliosis   POSTURE: rounded shoulders and forward head  PALPATION:  Lateral R thigh tender, R piriformis tender   LUMBAR ROM:   AROM eval  Flexion Mod limitation, increased pain   Extension WNL, REIS some centralization   Right lateral flexion WNL   Left lateral flexion WNL   Right  rotation   Left rotation    (Blank rows = not tested)    LOWER EXTREMITY MMT:    MMT Right eval Left eval  Hip flexion 3+ 3+  Hip extension    Hip abduction 3 4  Hip adduction    Hip internal rotation    Hip external rotation 2+ 4-  Knee flexion    Knee extension    Ankle dorsiflexion    Ankle plantarflexion    Ankle inversion    Ankle eversion     (Blank rows = not tested)   TREATMENT DATE:   04/24/24 Nustep L 4 Resisted trunk flex and ext 20 x each Seated row and lat pull down 20# 2 sets 10 Upright row into trunk ext 2 sets 10 15# Reisted Gait backwar 5 x then 3 x each side 30# ( min A laterally d/t ankle) STS wt ball chest press 10 x then OH 10 x Seated green tband marching Supine green tband clams 20 x Supine red tband SLR 10 x then SLR with abd 10 x Feet on ball bridge, KTC,obl and iso abdominals ADD ball squeeze with bridge Standing hip flex,ext and abd 10 x BIL red tband PROM and stretching BIL LE and trunk   04/17/24 Checked Rt ankle, lateral swelling over lateral malleolus with slight discoloration , good ROM and MMT,tender laterally as expected Sitting on dyna disc 4 way pelvic ROM 15 x each Sitting on dyna disc hip flex and hip abd Red tband shld ext,row and ER on dyna disc 10 x each Core stab ex- feet on ball bridge,KTC,obl and iso abdominals Add ball squeeze,clams DL and SL with green tband , marching green tband SLR 10 x BIL SLR with abd 10 x BIL Mech lumb traction 15 min static 65#   04/13/24 PROM and stretching LEs and trunk NT stretching RT LE Core stab ex- feet on ball bridge,KTC,obl and iso abdominals Add ball squeeze,clams DL and SL with red tband , marching red tband SLR 10 x BIL SLR with abd 10 x BIL Mech lumb traction 15 min static 60# Discussed REIS to relief symptoms   04/03/24  Eval, HEP, POC  PATIENT EDUCATION:  Education details: exam findings, anatomy of region and how extension directional preference works/how movement affects her pain, POC, HEP  Person educated: Patient Education method: Explanation, Demonstration, and Handouts Education comprehension: verbalized understanding, returned demonstration, and needs further education  HOME EXERCISE PROGRAM: Access Code: [email or web address] URL: [email or web address] Date: 04/03/2024 Prepared by: Terrel Ferries  Exercises - Standing Lumbar Extension at Wall - Forearms  - 2-3 x daily - 7 x weekly - 1 sets - 10 reps - Hooklying Hamstring Stretch with Strap  - 2-3 x daily - 7 x weekly - 1 sets - 3 reps - 30 seconds  hold - Supine Transversus Abdominis Bracing - Hands on Ground  - 5 x daily - 7 x weekly - 1 sets - 10 reps - 5 seconds  hold  ASSESSMENT:  CLINICAL IMPRESSION: pt arrives stating she ending up getting inj after all and pain is gone. Progressed core stab ex with cuing for posture and core activation. ABD hip weakness noted but no pain.    Patient is a 58 y.o. F who was seen today for physical therapy evaluation and treatment for  Diagnosis  M51.16 (ICD-10-CM) - Intervertebral disc disorders with radiculopathy, lumbar region  . She was late to eval, so assessment was a bit limited. She did respond well to extension based movements, does have a lot of mm weakness in core and hips, also s/p abdominal surgery which is definitely causing some core weakness. Anticipate she will respond well to PT.   OBJECTIVE IMPAIRMENTS: decreased mobility, decreased ROM, decreased strength, hypomobility, increased fascial restrictions, increased muscle spasms, impaired flexibility, impaired sensation, improper body mechanics, postural dysfunction, and pain.   ACTIVITY LIMITATIONS: carrying, lifting, bending, and locomotion level  PARTICIPATION LIMITATIONS: meal prep, cleaning, laundry, shopping, community activity, occupation,  and yard work  PERSONAL FACTORS: Age, Fitness, Past/current experiences, Social background, and Time since onset of injury/illness/exacerbation are also affecting patient's functional outcome.   REHAB POTENTIAL: Good  CLINICAL DECISION MAKING: Stable/uncomplicated  EVALUATION COMPLEXITY: Low   GOALS: Goals reviewed with patient? No  SHORT TERM GOALS: Target date: 04/24/2024    Will be compliant with appropriate progressive HEP GOAL STATUS: progressing 04/13/24  5/27/25MET   2. Will demonstrate improved postural awareness with all functional tasks, use of ergonomic aides PRN/as desired GOAL STATUS: progressing 04/13/24   MET 04/24/24   3. Will demonstrate good functional biomechanics for bed mobility and floor to waist lifting mechanics   MET 04/24/24   LONG TERM GOALS: Target date: 05/15/2024   MMT to have improved by one grade all weak groups GOAL STATUS: Initial  2. Mm flexibility and spasms to have improved by at least 50% in order to improve functional movement patterns and for pain control GOAL STATUS: Initial  3. Pain to be no more than 2/10 with all functional activities GOAL STATUS: Initial   4. Radicular symptoms to have resolved by at least 75%      .  PLAN:  PT FREQUENCY: 2x/week  PT DURATION: 6 weeks  PLANNED INTERVENTIONS: 97750- Physical Performance Testing, 97110-Therapeutic exercises, 97530- Therapeutic activity, [id number]- Neuromuscular re-education, 97535- Self Care, 21308- Manual therapy, [id number]- Aquatic Therapy, [id number]- Traction (mechanical), Taping, and Dry Needling.  PLAN FOR NEXT SESSION: assess how she felt after core stab ex today Patient Details  Name: BRAXTYN DORFF MRN: [id number] Date of Birth: 04-27-1966 Referring Provider:  Gavin Kast, FNP  Encounter Date: 04/24/2024   Aquilla Bayley, PTA 04/24/2024, 10:57 AM  [facility]  at [facility] [address] [place], [place], [address] Phone:  [phone]   Fax:  [phone] at [facility] [address] [place], [place], [address] Phone: [phone]   Fax:  [phone] [facility] at [facility] [address] [place], [place], [address] Phone: [phone]   Fax:  [phone]

## 2024-04-24 NOTE — Telephone Encounter (Signed)
 Forwarding message  Copied from CRM [id number]. Topic: Clinical - Medication Question >> Apr 24, 2024 10:12 AM Adonis Hoot wrote: Reason for CRM: [facility] called in to obtain DEA number for Melissa Cohen for Ambien  prescription that was was sent over on behalf of patient.   Call back #:[phone] option 2 [phone]

## 2024-04-25 ENCOUNTER — Telehealth: Payer: Self-pay

## 2024-04-25 ENCOUNTER — Other Ambulatory Visit: Payer: Self-pay | Admitting: Internal Medicine

## 2024-04-25 MED ORDER — ZOLPIDEM TARTRATE 10 MG PO TABS: 10.0000 mg | ORAL_TABLET | Freq: Every evening | ORAL | 2 refills | Status: DC | PRN

## 2024-04-25 NOTE — Telephone Encounter (Signed)
 Spoke to [facility] rep mail order and RX for Ambien  will need to be resent; order was never reviewed please advise.

## 2024-04-25 NOTE — Telephone Encounter (Signed)
 Does she need refill? I see a refill was already sent in on 5/22

## 2024-04-25 NOTE — Telephone Encounter (Signed)
Forwarding message

## 2024-04-25 NOTE — Telephone Encounter (Signed)
Rx sent to [facility]

## 2024-04-25 NOTE — Telephone Encounter (Signed)
 Please advise   Copied from CRM [id number]. Topic: Clinical - Medication Question >> Apr 24, 2024 10:12 AM Adonis Hoot wrote: Reason for CRM: [facility] called in to obtain DEA number for Kasandra Pain for Ambien  prescription that was was sent over on behalf of patient.   Call back #[phone] option 2 [phone] >> Apr 25, 2024  9:59 AM Danae Duncans wrote: Shelagh Derrick- [facility] called to obtain DEA number for Kasandra Pain for Ambien  Prescription, Callback number [phone] Option 2 Reference Number: [id number]

## 2024-04-26 ENCOUNTER — Ambulatory Visit: Admitting: Internal Medicine

## 2024-04-26 ENCOUNTER — Ambulatory Visit: Admitting: Physical Therapy

## 2024-04-26 DIAGNOSIS — M6281 Muscle weakness (generalized): Secondary | ICD-10-CM | POA: Diagnosis not present

## 2024-04-26 DIAGNOSIS — M5417 Radiculopathy, lumbosacral region: Secondary | ICD-10-CM | POA: Diagnosis not present

## 2024-04-26 DIAGNOSIS — R29898 Other symptoms and signs involving the musculoskeletal system: Secondary | ICD-10-CM | POA: Diagnosis not present

## 2024-04-26 DIAGNOSIS — M79604 Pain in right leg: Secondary | ICD-10-CM | POA: Diagnosis not present

## 2024-04-26 NOTE — Therapy (Signed)
 OUTPATIENT PHYSICAL THERAPY THORACOLUMBAR    Patient Name: Melissa Cohen MRN: [id number] DOB:1966/04/06, 58 y.o., female Today's Date: 04/26/2024  END OF SESSION:  PT End of Session - 04/26/24 1141     Visit Number 5    Number of Visits 13    Date for PT Re-Evaluation 05/15/24    Authorization Type [employer]    Authorization Time Period 04/03/24 to 05/15/24    PT Start Time 1143    PT Stop Time 1228    PT Time Calculation (min) 45 min             Past Medical History:  Diagnosis Date   Allergy    Anemia    Anxiety    Arthritis    Osteo arthritis - bil knees   Asthma    as a young adult related to allergy flare   Atypical nevi 08/25/2018   1. LEFT MID BACK (MILD) - NO TX, 2. RIGHT CHEST (SEVERE) - W/S   Atypical nevi 09/14/2018   MID BACK (SEVERE) - W/S   Atypical nevi 11/13/2018   LEFT POST. NECK INCIDENTRAL MOLE (MILD)   Atypical nevi 11/27/2018   RIGHT CHEST + MARGIN   Atypical nevus 01/02/2018   LEFT UPPER BACK (SEVERE) - W/S   Atypical nevus 01/04/2019   LEFT STERNUM ( MODERATE)   Atypical nevus 01/04/2019   LEFT POST NECK INF. (SEVERE)   Atypical nevus 01/04/2019   RECURRENT MID BACK - [facility] DR. PAVLIS   Back pain    Basal cell carcinoma 06/06/2008   LEFT UPPER SHOULDER PROX. @ [facility]   Blood transfusion without reported diagnosis    from [facility] several   Chest pain    Depression    Fatty liver    Gallbladder problem    Gastric ulcer    GERD (gastroesophageal reflux disease)    Heart murmur    Hyperlipidemia    Hypertension    Iron deficiency anemia    Joint pain    Knee pain    Lower extremity edema    Melanoma (HCC)    right cleavage, posterior left shoulder   MM (malignant melanoma of skin) (HCC) 09/14/2018   LEFT POST. NECK MIS - EXC   MM (malignant melanoma of skin) (HCC) 09/14/2018   RIGHT CHEST MIS - EXC   Obesity    Restless leg syndrome    Seizures (HCC)    2-3 d/t hypogylcermia - last seizure was 1998    Shortness of breath on exertion    Sleep apnea    sleep study showed mild sleep apnea - no c-papa needed   Ulcerative colitis    Dx at age 33; had been 30 years since her last flareup and then had a flareup recently in summer of 2017. Was then started back on Remicade  and is doing well since.   Vitamin D  deficiency    Past Surgical History:  Procedure Laterality Date   APPENDECTOMY     BREAST ENHANCEMENT SURGERY  2004   CHOLECYSTECTOMY  2011   COLONOSCOPY  2019   LAPAROTOMY N/A 06/17/2023   Procedure: EXPLORATORY LAPAROTOMY, REPAIR OF COLOTOMY;  Surgeon: Joyce Nixon, MD;  Location: [facility];  Service: General;  Laterality: N/A;   right eye lasix     02/17/12   TONSILLECTOMY AND ADENOIDECTOMY     TUBAL LIGATION     UPPER GASTROINTESTINAL ENDOSCOPY     Uterine ablation     WISDOM TOOTH EXTRACTION  Patient Active Problem List   Diagnosis Date Noted   Lumbar disc herniation with radiculopathy 04/04/2024   Seizures (HCC)    Restless leg syndrome    Hyperlipidemia    Sleep apnea    Perforation of colon as colonoscopy complication (HCC) 06/17/2023   Low back pain 05/13/2021   Adhesive capsulitis of left shoulder 04/01/2021   Metabolic syndrome 02/08/2021   At risk for impaired metabolic function 12/09/2020   Polyphagia 11/04/2020   Mood disorder, with emotional eating 11/04/2020   At risk for activity intolerance 11/04/2020   Vasovagal near syncope 03/26/2020   Frequent falls 02/13/2020   HTN (hypertension) 02/13/2020   Systolic murmur 03/26/2017   Chronic rhinitis 03/17/2016   Menopausal syndrome (hot flushes) 08/23/2014   Screening for malignant neoplasm of cervix 02/25/2014   Anxiety and depression 10/08/2011   Disorder of sacrum 04/12/2011   TROCHANTERIC BURSITIS, BILATERAL 04/12/2011   Benign neoplasm of skin 07/14/2010   Cervical radiculopathy 07/14/2010   GERD 12/31/2009   CARCINOMA, BASAL CELL 03/14/2008   Vitamin D  deficiency 03/06/2008   HYPERTRIGLYCERIDEMIA  03/06/2008   Ulcerative colitis (HCC) 02/09/2007   INSOMNIA 02/09/2007    PCP: Gavin Kast FNP   REFERRING PROVIDER: Conard Decent, MD  REFERRING DIAG:  Diagnosis  M51.16 (ICD-10-CM) - Intervertebral disc disorders with radiculopathy, lumbar region    Rationale for Evaluation and Treatment: Rehabilitation  THERAPY DIAG:  Muscle weakness (generalized)  Radiculopathy, lumbosacral region  ONSET DATE: week before last (late April 2025)  SUBJECTIVE:                                                                                                                                                                                           SUBJECTIVE STATEMENT: mid back soreness form last session but that is it    PERTINENT HISTORY:  See above   PAIN:  Are you having pain? Yes: NPRS scale: 2/10 Pain location: R LE  Pain description: aching in leg, radicular sx to mid calf today but can vary Aggravating factors: nothing, maybe walking on it  Relieving factors: maybe walking shoes instead of barefoot   PRECAUTIONS: None  RED FLAGS: None   WEIGHT BEARING RESTRICTIONS: No  FALLS:  Has patient fallen in last 6 months? Yes. Number of falls 1- fell over some plants she pulled inside to protect from frost, no FOF   LIVING ENVIRONMENT: Lives with: lives with their partner Lives in: House/apartment   OCCUPATION: travel agent- lots of sitting, lots of computer work, no ergonomic set up   PLOF: Independent, Independent with basic ADLs, Independent with gait, and Independent with transfers  PATIENT GOALS: feel better and quit hurting, strengthen   NEXT MD VISIT: Mina Alter July 8th  OBJECTIVE:  Note: Objective measures were completed at Evaluation unless otherwise noted.  DIAGNOSTIC FINDINGS:  CLINICAL DATA:  Lumbar radiculopathy. History of degenerative disc disease. Right-sided low back pain with right leg pain and foot coldness 4 days. No known injury or  prior relevant surgery.   EXAM: MRI LUMBAR SPINE WITHOUT CONTRAST   TECHNIQUE: Multiplanar, multisequence MR imaging of the lumbar spine was performed. No intravenous contrast was administered.   COMPARISON:  Lumbar spine radiographs 03/24/2024. Abdominopelvic CTA 06/16/2023. Lumbar MRI 04/02/2021.   FINDINGS: Segmentation: Conventional anatomy assumed, with the last open disc space designated L5-S1.Concordant with prior imaging.   Alignment: Stable mild convex left scoliosis and mild chronic retrolisthesis at L2-3.   Vertebrae: No worrisome osseous lesion, acute fracture or pars defect. The visualized sacroiliac joints appear unremarkable.   Conus medullaris: Extends to the L1 level. The conus and cauda equina appear normal.   Paraspinal and other soft tissues: No significant paraspinal findings.   Disc levels:   Sagittal images demonstrate no significant disc space findings within the visualized lower thoracic spine.   L1-2: Stable mild loss of disc height with a chronic small left paracentral disc extrusion. Minimal mass effect on the thecal sac without foraminal narrowing or nerve root encroachment.   L2-3: Chronic loss of disc height with annular disc bulging and endplate osteophytes. Mild bilateral facet hypertrophy. Chronic mild lateral recess and foraminal narrowing bilaterally without nerve root encroachment. The spinal canal is patent.   L3-4: Chronic loss of disc height with annular disc bulging and endplate osteophytes asymmetric to the right. New small disc protrusion in the right subarticular zone with similar mild chronic right lateral recess narrowing. Unchanged mild right foraminal narrowing without L3 nerve root encroachment. The spinal canal and left foramen are patent.   L4-5: Preserved disc height and hydration. Mild facet hypertrophy. No spinal stenosis or significant foraminal narrowing.   L5-S1: Mild loss of disc height. There is a new  right-sided disc extrusion with caudal migration of a disc fragment into the right S1 lateral recess. There is mass effect on the right S1 nerve root. Mild facet and ligamentous hypertrophy without resulting foraminal narrowing or L5 nerve root encroachment. The spinal canal and left foramen are patent.   IMPRESSION: 1. New right-sided disc extrusion at L5-S1 with caudal migration of a disc fragment into the right S1 lateral recess and mass effect on the right S1 nerve root. 2. New small disc protrusion in the right subarticular zone at L3-4 with similar mild chronic right lateral recess and right foraminal narrowing. 3. Stable chronic mild lateral recess and foraminal narrowing bilaterally at L2-3. 4. No acute osseous findings or significant central stenosis.      COGNITION: Overall cognitive status: Within functional limits for tasks assessed     SENSATION: Not tested  MUSCLE LENGTH:  L HS and piriformis Min limitation R HS and piriformis Mod limitation  Sciatic nerve R very tense and immobile, (+) tension test   L LE significantly shorter than R, hx of scoliosis   POSTURE: rounded shoulders and forward head  PALPATION:  Lateral R thigh tender, R piriformis tender   LUMBAR ROM:   AROM eval  Flexion Mod limitation, increased pain   Extension WNL, REIS some centralization   Right lateral flexion WNL   Left lateral flexion WNL   Right rotation   Left rotation    (Blank  rows = not tested)    LOWER EXTREMITY MMT:    MMT Right eval Left eval  Hip flexion 3+ 3+  Hip extension    Hip abduction 3 4  Hip adduction    Hip internal rotation    Hip external rotation 2+ 4-  Knee flexion    Knee extension    Ankle dorsiflexion    Ankle plantarflexion    Ankle inversion    Ankle eversion     (Blank rows = not tested)   TREATMENT DATE:   04/26/24 Nustep L 5 Step up opp leg ext 10 x BIL 6 inch then laterally with opp leg ABD Green tband shld ext,  row and horz abd 15 x Hip 3 way red tband Bird Dog HEP Traction 60# 12 min  04/24/24 Nustep L 4 Resisted trunk flex and ext 20 x each Seated row and lat pull down 20# 2 sets 10 Upright row into trunk ext 2 sets 10 15# Reisted Gait backwar 5 x then 3 x each side 30# ( min A laterally d/t ankle) STS wt ball chest press 10 x then OH 10 x Seated green tband marching Supine green tband clams 20 x Supine red tband SLR 10 x then SLR with abd 10 x Feet on ball bridge, KTC,obl and iso abdominals ADD ball squeeze with bridge Standing hip flex,ext and abd 10 x BIL red tband PROM and stretching BIL LE and trunk   04/17/24 Checked Rt ankle, lateral swelling over lateral malleolus with slight discoloration , good ROM and MMT,tender laterally as expected Sitting on dyna disc 4 way pelvic ROM 15 x each Sitting on dyna disc hip flex and hip abd Red tband shld ext,row and ER on dyna disc 10 x each Core stab ex- feet on ball bridge,KTC,obl and iso abdominals Add ball squeeze,clams DL and SL with green tband , marching green tband SLR 10 x BIL SLR with abd 10 x BIL Mech lumb traction 15 min static 65#   04/13/24 PROM and stretching LEs and trunk NT stretching RT LE Core stab ex- feet on ball bridge,KTC,obl and iso abdominals Add ball squeeze,clams DL and SL with red tband , marching red tband SLR 10 x BIL SLR with abd 10 x BIL Mech lumb traction 15 min static 60# Discussed REIS to relief symptoms   04/03/24  Eval, HEP, POC                                                                                                                                   PATIENT EDUCATION:  Education details: exam findings, anatomy of region and how extension directional preference works/how movement affects her pain, POC, HEP  Person educated: Patient Education method: Explanation, Demonstration, and Handouts Education comprehension: verbalized understanding, returned demonstration, and needs  further education  HOME EXERCISE PROGRAM: Access Code: [email or web address] URL: [email or web address] Date: 04/26/2024 Prepared by: Shelvy Dickens Oleda Borski  Exercises -  Shoulder extension with resistance - Neutral  - 1 x daily - 7 x weekly - 1 sets - 15 reps - Standing Shoulder Row with Anchored Resistance  - 1 x daily - 7 x weekly - 1 sets - 15 reps - Seated Shoulder Horizontal Abduction with Resistance - Palms Down  - 1 x daily - 7 x weekly - 1 sets - 15 reps - Bird Dog  - 1 x daily - 7 x weekly - 2 sets - 10 reps - 3 hold - Forward Step Up  - 1 x daily - 7 x weekly - 1 sets - 10 reps - Lateral Step Up  - 1 x daily - 7 x weekly - 1 sets - 10 reps - Standing Hip Flexion with Resistance Loop  - 1 x daily - 7 x weekly - 2 sets - 10 reps - 3 hold - Standing Hip Extension and Flexion with Resistance  - 1 x daily - 7 x weekly - 2 sets - 10 reps - 3 hold - Hip Abduction with Resistance Loop  - 1 x daily - 7 x weekly - 2 sets - 10 reps - 3 hold    Access Code: [email or web address] URL: [email or web address] Date: 04/03/2024 Prepared by: Terrel Ferries  Exercises - Standing Lumbar Extension at Wall - Forearms  - 2-3 x daily - 7 x weekly - 1 sets - 10 reps - Hooklying Hamstring Stretch with Strap  - 2-3 x daily - 7 x weekly - 1 sets - 3 reps - 30 seconds  hold - Supine Transversus Abdominis Bracing - Hands on Ground  - 5 x daily - 7 x weekly - 1 sets - 10 reps - 5 seconds  hold  ASSESSMENT:  CLINICAL IMPRESSION: pt arrived sore from last session but overall still letter. Increased HEP with core engagement. Traction at end for stretch and pain as she did have some radiating pain with weight on RT LE   Patient is a 58 y.o. F who was seen today for physical therapy evaluation and treatment for  Diagnosis  M51.16 (ICD-10-CM) - Intervertebral disc disorders with radiculopathy, lumbar region  . She was late to eval, so assessment was a bit limited. She did respond well to extension based  movements, does have a lot of mm weakness in core and hips, also s/p abdominal surgery which is definitely causing some core weakness. Anticipate she will respond well to PT.   OBJECTIVE IMPAIRMENTS: decreased mobility, decreased ROM, decreased strength, hypomobility, increased fascial restrictions, increased muscle spasms, impaired flexibility, impaired sensation, improper body mechanics, postural dysfunction, and pain.   ACTIVITY LIMITATIONS: carrying, lifting, bending, and locomotion level  PARTICIPATION LIMITATIONS: meal prep, cleaning, laundry, shopping, community activity, occupation, and yard work  PERSONAL FACTORS: Age, Fitness, Past/current experiences, Social background, and Time since onset of injury/illness/exacerbation are also affecting patient's functional outcome.   REHAB POTENTIAL: Good  CLINICAL DECISION MAKING: Stable/uncomplicated  EVALUATION COMPLEXITY: Low   GOALS: Goals reviewed with patient? No  SHORT TERM GOALS: Target date: 04/24/2024    Will be compliant with appropriate progressive HEP GOAL STATUS: progressing 04/13/24  5/27/25MET   2. Will demonstrate improved postural awareness with all functional tasks, use of ergonomic aides PRN/as desired GOAL STATUS: progressing 04/13/24   MET 04/24/24   3. Will demonstrate good functional biomechanics for bed mobility and floor to waist lifting mechanics   MET 04/24/24   LONG TERM GOALS: Target date: 05/15/2024   MMT to have improved by one grade  all weak groups GOAL STATUS: Initial  2. Mm flexibility and spasms to have improved by at least 50% in order to improve functional movement patterns and for pain control GOAL STATUS: Initial  3. Pain to be no more than 2/10 with all functional activities GOAL STATUS: Initial   4. Radicular symptoms to have resolved by at least 75%      .  PLAN:  PT FREQUENCY: 2x/week  PT DURATION: 6 weeks  PLANNED INTERVENTIONS: 97750- Physical Performance Testing,  97110-Therapeutic exercises, 97530- Therapeutic activity, [id number]- Neuromuscular re-education, 97535- Self Care, 16109- Manual therapy, [id number]- Aquatic Therapy, [id number]- Traction (mechanical), Taping, and Dry Needling.  PLAN FOR NEXT SESSION: assess how she felt after core stab ex today Patient Details  Name: Melissa Cohen MRN: [id number] Date of Birth: 03-03-1966 Referring Provider:  Gavin Kast, FNP  Encounter Date: 04/26/2024   Aquilla Bayley, PTA 04/26/2024, 11:43 AM  [facility] [address] [place], [place], [address] Phone: [phone]   Fax:  [phone] [address] [address]. [place], [place], [address] Phone: [phone]   Fax:  [phone] [facility] at [facility] [address] [place], [place], [address] Phone: [phone]   Fax:  [phone] [facility] at [facility] [address] [place], [place], [address] Phone: [phone]   Fax:  [phone]

## 2024-04-27 ENCOUNTER — Telehealth: Payer: Self-pay

## 2024-04-27 NOTE — Telephone Encounter (Signed)
 Forwarding message  Copied from CRM [id number]. Topic: Clinical - Prescription Issue >> Apr 27, 2024 10:55 AM Earnestine Goes B wrote: Reason for CRM: [facility] called regarding zolpidem  (AMBIEN ) 10 MG tablet states the prescription is missing providers DEA#  for Dr Hildy Lowers. Please call  back at [phone] option 2 Reference# [phone] open 7am 530

## 2024-04-30 ENCOUNTER — Other Ambulatory Visit (HOSPITAL_COMMUNITY)

## 2024-04-30 ENCOUNTER — Telehealth: Payer: Self-pay

## 2024-04-30 NOTE — Telephone Encounter (Signed)
 Please advise Copied from CRM [id number]. Topic: Clinical - Prescription Issue >> Apr 30, 2024 10:07 AM Jenice Mitts wrote: Reason for CRM: [facility] called regarding zolpidem  (AMBIEN ) 10 MG tablet states the prescription is missing providers DEA#  for Dr Hildy Lowers. Please call  back at [phone] option 2 Reference# [phone]   I saw it was already provided but they stated they do not have It

## 2024-04-30 NOTE — Telephone Encounter (Signed)
 Spoke with representative from [facility] and DEA was provided.

## 2024-04-30 NOTE — Telephone Encounter (Signed)
 RX was sent on 04/25/2024 by Gavin Kast and DEA # was given to [facility] pharmacy  representative on 04/25/2024

## 2024-05-01 ENCOUNTER — Ambulatory Visit: Attending: Internal Medicine | Admitting: Physical Therapy

## 2024-05-01 DIAGNOSIS — M6281 Muscle weakness (generalized): Secondary | ICD-10-CM | POA: Diagnosis not present

## 2024-05-01 DIAGNOSIS — M79604 Pain in right leg: Secondary | ICD-10-CM | POA: Diagnosis not present

## 2024-05-01 DIAGNOSIS — M5417 Radiculopathy, lumbosacral region: Secondary | ICD-10-CM | POA: Diagnosis not present

## 2024-05-01 NOTE — Therapy (Signed)
 OUTPATIENT PHYSICAL THERAPY THORACOLUMBAR    Patient Name: Melissa Cohen MRN: [id number] DOB:1966-04-09, 58 y.o., female Today's Date: 05/01/2024  END OF SESSION:  PT End of Session - 05/01/24 1059     Visit Number 6    Number of Visits 13    Date for PT Re-Evaluation 05/15/24    Authorization Type [employer]    Authorization Time Period 04/03/24 to 05/15/24    PT Start Time 1100    PT Stop Time 1145    PT Time Calculation (min) 45 min             Past Medical History:  Diagnosis Date   Allergy    Anemia    Anxiety    Arthritis    Osteo arthritis - bil knees   Asthma    as a young adult related to allergy flare   Atypical nevi 08/25/2018   1. LEFT MID BACK (MILD) - NO TX, 2. RIGHT CHEST (SEVERE) - W/S   Atypical nevi 09/14/2018   MID BACK (SEVERE) - W/S   Atypical nevi 11/13/2018   LEFT POST. NECK INCIDENTRAL MOLE (MILD)   Atypical nevi 11/27/2018   RIGHT CHEST + MARGIN   Atypical nevus 01/02/2018   LEFT UPPER BACK (SEVERE) - W/S   Atypical nevus 01/04/2019   LEFT STERNUM ( MODERATE)   Atypical nevus 01/04/2019   LEFT POST NECK INF. (SEVERE)   Atypical nevus 01/04/2019   RECURRENT MID BACK - [facility] DR. PAVLIS   Back pain    Basal cell carcinoma 06/06/2008   LEFT UPPER SHOULDER PROX. @ [facility]   Blood transfusion without reported diagnosis    from [facility] several   Chest pain    Depression    Fatty liver    Gallbladder problem    Gastric ulcer    GERD (gastroesophageal reflux disease)    Heart murmur    Hyperlipidemia    Hypertension    Iron deficiency anemia    Joint pain    Knee pain    Lower extremity edema    Melanoma (HCC)    right cleavage, posterior left shoulder   MM (malignant melanoma of skin) (HCC) 09/14/2018   LEFT POST. NECK MIS - EXC   MM (malignant melanoma of skin) (HCC) 09/14/2018   RIGHT CHEST MIS - EXC   Obesity    Restless leg syndrome    Seizures (HCC)    2-3 d/t hypogylcermia - last seizure was 1998    Shortness of breath on exertion    Sleep apnea    sleep study showed mild sleep apnea - no c-papa needed   Ulcerative colitis    Dx at age 60; had been 30 years since her last flareup and then had a flareup recently in summer of 2017. Was then started back on Remicade  and is doing well since.   Vitamin D  deficiency    Past Surgical History:  Procedure Laterality Date   APPENDECTOMY     BREAST ENHANCEMENT SURGERY  2004   CHOLECYSTECTOMY  2011   COLONOSCOPY  2019   LAPAROTOMY N/A 06/17/2023   Procedure: EXPLORATORY LAPAROTOMY, REPAIR OF COLOTOMY;  Surgeon: Joyce Nixon, MD;  Location: [facility];  Service: General;  Laterality: N/A;   right eye lasix     02/17/12   TONSILLECTOMY AND ADENOIDECTOMY     TUBAL LIGATION     UPPER GASTROINTESTINAL ENDOSCOPY     Uterine ablation     WISDOM TOOTH EXTRACTION  Patient Active Problem List   Diagnosis Date Noted   Lumbar disc herniation with radiculopathy 04/04/2024   Seizures (HCC)    Restless leg syndrome    Hyperlipidemia    Sleep apnea    Perforation of colon as colonoscopy complication (HCC) 06/17/2023   Low back pain 05/13/2021   Adhesive capsulitis of left shoulder 04/01/2021   Metabolic syndrome 02/08/2021   At risk for impaired metabolic function 12/09/2020   Polyphagia 11/04/2020   Mood disorder, with emotional eating 11/04/2020   At risk for activity intolerance 11/04/2020   Vasovagal near syncope 03/26/2020   Frequent falls 02/13/2020   HTN (hypertension) 02/13/2020   Systolic murmur 03/26/2017   Chronic rhinitis 03/17/2016   Menopausal syndrome (hot flushes) 08/23/2014   Screening for malignant neoplasm of cervix 02/25/2014   Anxiety and depression 10/08/2011   Disorder of sacrum 04/12/2011   TROCHANTERIC BURSITIS, BILATERAL 04/12/2011   Benign neoplasm of skin 07/14/2010   Cervical radiculopathy 07/14/2010   GERD 12/31/2009   CARCINOMA, BASAL CELL 03/14/2008   Vitamin D  deficiency 03/06/2008   HYPERTRIGLYCERIDEMIA  03/06/2008   Ulcerative colitis (HCC) 02/09/2007   INSOMNIA 02/09/2007    PCP: Gavin Kast FNP   REFERRING PROVIDER: Conard Decent, MD  REFERRING DIAG:  Diagnosis  M51.16 (ICD-10-CM) - Intervertebral disc disorders with radiculopathy, lumbar region    Rationale for Evaluation and Treatment: Rehabilitation  THERAPY DIAG:  Muscle weakness (generalized)  Radiculopathy, lumbosacral region  ONSET DATE: week before last (late April 2025)  SUBJECTIVE:                                                                                                                                                                                           SUBJECTIVE STATEMENT: okay, walked over weekend and some radiating pain RT LE. Steps make back ache too. Neuro MD 7/1    PERTINENT HISTORY:  See above   PAIN:  Are you having pain? Yes: NPRS scale: 2/10 Pain location: R LE  Pain description: aching in leg, radicular sx to mid calf today but can vary Aggravating factors: nothing, maybe walking on it  Relieving factors: maybe walking shoes instead of barefoot   PRECAUTIONS: None  RED FLAGS: None   WEIGHT BEARING RESTRICTIONS: No  FALLS:  Has patient fallen in last 6 months? Yes. Number of falls 1- fell over some plants she pulled inside to protect from frost, no FOF   LIVING ENVIRONMENT: Lives with: lives with their partner Lives in: House/apartment   OCCUPATION: travel agent- lots of sitting, lots of computer work, no ergonomic set up   PLOF: Independent, Independent with basic ADLs,  Independent with gait, and Independent with transfers  PATIENT GOALS: feel better and quit hurting, strengthen   NEXT MD VISIT: Mina Alter July 8th  OBJECTIVE:  Note: Objective measures were completed at Evaluation unless otherwise noted.  DIAGNOSTIC FINDINGS:  CLINICAL DATA:  Lumbar radiculopathy. History of degenerative disc disease. Right-sided low back pain with right leg pain and  foot coldness 4 days. No known injury or prior relevant surgery.   EXAM: MRI LUMBAR SPINE WITHOUT CONTRAST   TECHNIQUE: Multiplanar, multisequence MR imaging of the lumbar spine was performed. No intravenous contrast was administered.   COMPARISON:  Lumbar spine radiographs 03/24/2024. Abdominopelvic CTA 06/16/2023. Lumbar MRI 04/02/2021.   FINDINGS: Segmentation: Conventional anatomy assumed, with the last open disc space designated L5-S1.Concordant with prior imaging.   Alignment: Stable mild convex left scoliosis and mild chronic retrolisthesis at L2-3.   Vertebrae: No worrisome osseous lesion, acute fracture or pars defect. The visualized sacroiliac joints appear unremarkable.   Conus medullaris: Extends to the L1 level. The conus and cauda equina appear normal.   Paraspinal and other soft tissues: No significant paraspinal findings.   Disc levels:   Sagittal images demonstrate no significant disc space findings within the visualized lower thoracic spine.   L1-2: Stable mild loss of disc height with a chronic small left paracentral disc extrusion. Minimal mass effect on the thecal sac without foraminal narrowing or nerve root encroachment.   L2-3: Chronic loss of disc height with annular disc bulging and endplate osteophytes. Mild bilateral facet hypertrophy. Chronic mild lateral recess and foraminal narrowing bilaterally without nerve root encroachment. The spinal canal is patent.   L3-4: Chronic loss of disc height with annular disc bulging and endplate osteophytes asymmetric to the right. New small disc protrusion in the right subarticular zone with similar mild chronic right lateral recess narrowing. Unchanged mild right foraminal narrowing without L3 nerve root encroachment. The spinal canal and left foramen are patent.   L4-5: Preserved disc height and hydration. Mild facet hypertrophy. No spinal stenosis or significant foraminal narrowing.   L5-S1: Mild  loss of disc height. There is a new right-sided disc extrusion with caudal migration of a disc fragment into the right S1 lateral recess. There is mass effect on the right S1 nerve root. Mild facet and ligamentous hypertrophy without resulting foraminal narrowing or L5 nerve root encroachment. The spinal canal and left foramen are patent.   IMPRESSION: 1. New right-sided disc extrusion at L5-S1 with caudal migration of a disc fragment into the right S1 lateral recess and mass effect on the right S1 nerve root. 2. New small disc protrusion in the right subarticular zone at L3-4 with similar mild chronic right lateral recess and right foraminal narrowing. 3. Stable chronic mild lateral recess and foraminal narrowing bilaterally at L2-3. 4. No acute osseous findings or significant central stenosis.      COGNITION: Overall cognitive status: Within functional limits for tasks assessed     SENSATION: Not tested  MUSCLE LENGTH:  L HS and piriformis Min limitation R HS and piriformis Mod limitation  Sciatic nerve R very tense and immobile, (+) tension test   L LE significantly shorter than R, hx of scoliosis   POSTURE: rounded shoulders and forward head  PALPATION:  Lateral R thigh tender, R piriformis tender   LUMBAR ROM:   AROM eval  Flexion Mod limitation, increased pain   Extension WNL, REIS some centralization   Right lateral flexion WNL   Left lateral flexion WNL   Right rotation  Left rotation    (Blank rows = not tested)    LOWER EXTREMITY MMT:    MMT Right eval Left eval  Hip flexion 3+ 3+  Hip extension    Hip abduction 3 4  Hip adduction    Hip internal rotation    Hip external rotation 2+ 4-  Knee flexion    Knee extension    Ankle dorsiflexion    Ankle plantarflexion    Ankle inversion    Ankle eversion     (Blank rows = not tested)   TREATMENT DATE:   05/01/24 Nustep L 5 10# cable pulleys shld et and row 2 sets 10 Black tband  trunk ext and flex 20 x Lat pull down 20# 2 sets 10 Core stab ex 15 min PROM LE and trunk with NT stretching on LLE- issued NT for home  04/26/24 Nustep L 5 Step up opp leg ext 10 x BIL 6 inch then laterally with opp leg ABD Green tband shld ext, row and horz abd 15 x Hip 3 way red tband Bird Dog HEP Traction 60# 12 min  04/24/24 Nustep L 4 Resisted trunk flex and ext 20 x each Seated row and lat pull down 20# 2 sets 10 Upright row into trunk ext 2 sets 10 15# Reisted Gait backwar 5 x then 3 x each side 30# ( min A laterally d/t ankle) STS wt ball chest press 10 x then OH 10 x Seated green tband marching Supine green tband clams 20 x Supine red tband SLR 10 x then SLR with abd 10 x Feet on ball bridge, KTC,obl and iso abdominals ADD ball squeeze with bridge Standing hip flex,ext and abd 10 x BIL red tband PROM and stretching BIL LE and trunk   04/17/24 Checked Rt ankle, lateral swelling over lateral malleolus with slight discoloration , good ROM and MMT,tender laterally as expected Sitting on dyna disc 4 way pelvic ROM 15 x each Sitting on dyna disc hip flex and hip abd Red tband shld ext,row and ER on dyna disc 10 x each Core stab ex- feet on ball bridge,KTC,obl and iso abdominals Add ball squeeze,clams DL and SL with green tband , marching green tband SLR 10 x BIL SLR with abd 10 x BIL Mech lumb traction 15 min static 65#   04/13/24 PROM and stretching LEs and trunk NT stretching RT LE Core stab ex- feet on ball bridge,KTC,obl and iso abdominals Add ball squeeze,clams DL and SL with red tband , marching red tband SLR 10 x BIL SLR with abd 10 x BIL Mech lumb traction 15 min static 60# Discussed REIS to relief symptoms   04/03/24  Eval, HEP, POC                                                                                                                                   PATIENT EDUCATION:  Education details: exam  findings, anatomy of region and how  extension directional preference works/how movement affects her pain, POC, HEP  Person educated: Patient Education method: Explanation, Demonstration, and Handouts Education comprehension: verbalized understanding, returned demonstration, and needs further education  HOME EXERCISE PROGRAM: Access Code: [email or web address] URL: [email or web address] Date: 04/26/2024 Prepared by: Isidore Margraf  Exercises - Shoulder extension with resistance - Neutral  - 1 x daily - 7 x weekly - 1 sets - 15 reps - Standing Shoulder Row with Anchored Resistance  - 1 x daily - 7 x weekly - 1 sets - 15 reps - Seated Shoulder Horizontal Abduction with Resistance - Palms Down  - 1 x daily - 7 x weekly - 1 sets - 15 reps - Bird Dog  - 1 x daily - 7 x weekly - 2 sets - 10 reps - 3 hold - Forward Step Up  - 1 x daily - 7 x weekly - 1 sets - 10 reps - Lateral Step Up  - 1 x daily - 7 x weekly - 1 sets - 10 reps - Standing Hip Flexion with Resistance Loop  - 1 x daily - 7 x weekly - 2 sets - 10 reps - 3 hold - Standing Hip Extension and Flexion with Resistance  - 1 x daily - 7 x weekly - 2 sets - 10 reps - 3 hold - Hip Abduction with Resistance Loop  - 1 x daily - 7 x weekly - 2 sets - 10 reps - 3 hold    Access Code: [email or web address] URL: [email or web address] Date: 04/03/2024 Prepared by: Terrel Ferries  Exercises - Standing Lumbar Extension at Wall - Forearms  - 2-3 x daily - 7 x weekly - 1 sets - 10 reps - Hooklying Hamstring Stretch with Strap  - 2-3 x daily - 7 x weekly - 1 sets - 3 reps - 30 seconds  hold - Supine Transversus Abdominis Bracing - Hands on Ground  - 5 x daily - 7 x weekly - 1 sets - 10 reps - 5 seconds  hold  ASSESSMENT:  CLINICAL IMPRESSION: progressed core stab with cuing. Tightness RT > Left and positive NT stretching- issued for HEP  Patient is a 58 y.o. F who was seen today for physical therapy evaluation and treatment for  Diagnosis  M51.16 (ICD-10-CM) - Intervertebral  disc disorders with radiculopathy, lumbar region  . She was late to eval, so assessment was a bit limited. She did respond well to extension based movements, does have a lot of mm weakness in core and hips, also s/p abdominal surgery which is definitely causing some core weakness. Anticipate she will respond well to PT.   OBJECTIVE IMPAIRMENTS: decreased mobility, decreased ROM, decreased strength, hypomobility, increased fascial restrictions, increased muscle spasms, impaired flexibility, impaired sensation, improper body mechanics, postural dysfunction, and pain.   ACTIVITY LIMITATIONS: carrying, lifting, bending, and locomotion level  PARTICIPATION LIMITATIONS: meal prep, cleaning, laundry, shopping, community activity, occupation, and yard work  PERSONAL FACTORS: Age, Fitness, Past/current experiences, Social background, and Time since onset of injury/illness/exacerbation are also affecting patient's functional outcome.   REHAB POTENTIAL: Good  CLINICAL DECISION MAKING: Stable/uncomplicated  EVALUATION COMPLEXITY: Low   GOALS: Goals reviewed with patient? No  SHORT TERM GOALS: Target date: 04/24/2024    Will be compliant with appropriate progressive HEP GOAL STATUS: progressing 04/13/24  5/27/25MET   2. Will demonstrate improved postural awareness with all functional tasks, use of ergonomic aides PRN/as desired GOAL STATUS: progressing 04/13/24   MET 04/24/24  3. Will demonstrate good functional biomechanics for bed mobility and floor to waist lifting mechanics   MET 04/24/24   LONG TERM GOALS: Target date: 05/15/2024   MMT to have improved by one grade all weak groups GOAL STATUS: Initial  2. Mm flexibility and spasms to have improved by at least 50% in order to improve functional movement patterns and for pain control GOAL STATUS: Initial  3. Pain to be no more than 2/10 with all functional activities GOAL STATUS: Initial   4. Radicular symptoms to have resolved by at  least 75%      .  PLAN:  PT FREQUENCY: 2x/week  PT DURATION: 6 weeks  PLANNED INTERVENTIONS: 97750- Physical Performance Testing, 97110-Therapeutic exercises, 97530- Therapeutic activity, [id number]- Neuromuscular re-education, 97535- Self Care, 16109- Manual therapy, [id number]- Aquatic Therapy, [id number]- Traction (mechanical), Taping, and Dry Needling.  PLAN FOR NEXT SESSION: assess how she felt after core stab ex today Patient Details  Name: Melissa Cohen MRN: [id number] Date of Birth: 1966/05/30 Referring Provider:  Gavin Kast, FNP  Encounter Date: 05/01/2024   Aquilla Bayley, PTA 05/01/2024, 11:03 AM  [facility] at [facility] [address] [facility]. [place], [place], [address] Phone: [phone]   Fax:  [phone] at [facility] [address] [place], [place], [address] Phone: [phone]   Fax:  [phone] [facility] at [facility] [address] [place], [place], [address] Phone: [phone]   Fax:  [phone] [facility] at [facility] [address] [place], [place], [address] Phone: [phone]   Fax:  [phone] [facility] at [facility] [address] [place], [place], [address] Phone: [phone]   Fax:  [phone]  Patient Details  Name: Melissa Cohen MRN: [id number] Date of Birth: 05/30/1966 Referring Provider:  Gavin Kast, FNP  Encounter Date: 05/01/2024   Aquilla Bayley, PTA 05/01/2024, 11:03 AM  [facility] [place] [facility] at [facility] [address] [place], [place], [address] Phone: [phone]   Fax:  [phone]

## 2024-05-03 ENCOUNTER — Ambulatory Visit: Admitting: Physical Therapy

## 2024-05-03 ENCOUNTER — Ambulatory Visit (HOSPITAL_BASED_OUTPATIENT_CLINIC_OR_DEPARTMENT_OTHER)
Admission: RE | Admit: 2024-05-03 | Discharge: 2024-05-03 | Disposition: A | Discharge status: Home / Self Care | Source: Ambulatory Visit | Attending: Obstetrics and Gynecology | Admitting: Obstetrics and Gynecology

## 2024-05-03 DIAGNOSIS — Z780 Asymptomatic menopausal state: Secondary | ICD-10-CM | POA: Diagnosis not present

## 2024-05-03 DIAGNOSIS — M79604 Pain in right leg: Secondary | ICD-10-CM | POA: Diagnosis not present

## 2024-05-03 DIAGNOSIS — N951 Menopausal and female climacteric states: Secondary | ICD-10-CM | POA: Diagnosis not present

## 2024-05-03 DIAGNOSIS — M5417 Radiculopathy, lumbosacral region: Secondary | ICD-10-CM

## 2024-05-03 DIAGNOSIS — M8589 Other specified disorders of bone density and structure, multiple sites: Secondary | ICD-10-CM | POA: Diagnosis not present

## 2024-05-03 DIAGNOSIS — M6281 Muscle weakness (generalized): Secondary | ICD-10-CM

## 2024-05-03 NOTE — Therapy (Signed)
 OUTPATIENT PHYSICAL THERAPY THORACOLUMBAR    Patient Name: Melissa Cohen MRN: [id number] DOB:03/06/1966, 58 y.o., female Today's Date: 05/03/2024  END OF SESSION:  PT End of Session - 05/03/24 1102     Visit Number 7    Number of Visits 13    Date for PT Re-Evaluation 05/15/24    Authorization Type [employer]    Authorization Time Period 04/03/24 to 05/15/24    PT Start Time 1103    PT Stop Time 1145    PT Time Calculation (min) 42 min             Past Medical History:  Diagnosis Date   Allergy    Anemia    Anxiety    Arthritis    Osteo arthritis - bil knees   Asthma    as a young adult related to allergy flare   Atypical nevi 08/25/2018   1. LEFT MID BACK (MILD) - NO TX, 2. RIGHT CHEST (SEVERE) - W/S   Atypical nevi 09/14/2018   MID BACK (SEVERE) - W/S   Atypical nevi 11/13/2018   LEFT POST. NECK INCIDENTRAL MOLE (MILD)   Atypical nevi 11/27/2018   RIGHT CHEST + MARGIN   Atypical nevus 01/02/2018   LEFT UPPER BACK (SEVERE) - W/S   Atypical nevus 01/04/2019   LEFT STERNUM ( MODERATE)   Atypical nevus 01/04/2019   LEFT POST NECK INF. (SEVERE)   Atypical nevus 01/04/2019   RECURRENT MID BACK - [facility] DR. PAVLIS   Back pain    Basal cell carcinoma 06/06/2008   LEFT UPPER SHOULDER PROX. @ [facility]   Blood transfusion without reported diagnosis    from [facility] several   Chest pain    Depression    Fatty liver    Gallbladder problem    Gastric ulcer    GERD (gastroesophageal reflux disease)    Heart murmur    Hyperlipidemia    Hypertension    Iron deficiency anemia    Joint pain    Knee pain    Lower extremity edema    Melanoma (HCC)    right cleavage, posterior left shoulder   MM (malignant melanoma of skin) (HCC) 09/14/2018   LEFT POST. NECK MIS - EXC   MM (malignant melanoma of skin) (HCC) 09/14/2018   RIGHT CHEST MIS - EXC   Obesity    Restless leg syndrome    Seizures (HCC)    2-3 d/t hypogylcermia - last seizure was 1998    Shortness of breath on exertion    Sleep apnea    sleep study showed mild sleep apnea - no c-papa needed   Ulcerative colitis    Dx at age 36; had been 30 years since her last flareup and then had a flareup recently in summer of 2017. Was then started back on Remicade  and is doing well since.   Vitamin D  deficiency    Past Surgical History:  Procedure Laterality Date   APPENDECTOMY     BREAST ENHANCEMENT SURGERY  2004   CHOLECYSTECTOMY  2011   COLONOSCOPY  2019   LAPAROTOMY N/A 06/17/2023   Procedure: EXPLORATORY LAPAROTOMY, REPAIR OF COLOTOMY;  Surgeon: Joyce Nixon, MD;  Location: [facility];  Service: General;  Laterality: N/A;   right eye lasix     02/17/12   TONSILLECTOMY AND ADENOIDECTOMY     TUBAL LIGATION     UPPER GASTROINTESTINAL ENDOSCOPY     Uterine ablation     WISDOM TOOTH EXTRACTION  Patient Active Problem List   Diagnosis Date Noted   Lumbar disc herniation with radiculopathy 04/04/2024   Seizures (HCC)    Restless leg syndrome    Hyperlipidemia    Sleep apnea    Perforation of colon as colonoscopy complication (HCC) 06/17/2023   Low back pain 05/13/2021   Adhesive capsulitis of left shoulder 04/01/2021   Metabolic syndrome 02/08/2021   At risk for impaired metabolic function 12/09/2020   Polyphagia 11/04/2020   Mood disorder, with emotional eating 11/04/2020   At risk for activity intolerance 11/04/2020   Vasovagal near syncope 03/26/2020   Frequent falls 02/13/2020   HTN (hypertension) 02/13/2020   Systolic murmur 03/26/2017   Chronic rhinitis 03/17/2016   Menopausal syndrome (hot flushes) 08/23/2014   Screening for malignant neoplasm of cervix 02/25/2014   Anxiety and depression 10/08/2011   Disorder of sacrum 04/12/2011   TROCHANTERIC BURSITIS, BILATERAL 04/12/2011   Benign neoplasm of skin 07/14/2010   Cervical radiculopathy 07/14/2010   GERD 12/31/2009   CARCINOMA, BASAL CELL 03/14/2008   Vitamin D  deficiency 03/06/2008   HYPERTRIGLYCERIDEMIA  03/06/2008   Ulcerative colitis (HCC) 02/09/2007   INSOMNIA 02/09/2007    PCP: Gavin Kast FNP   REFERRING PROVIDER: Conard Decent, MD  REFERRING DIAG:  Diagnosis  M51.16 (ICD-10-CM) - Intervertebral disc disorders with radiculopathy, lumbar region    Rationale for Evaluation and Treatment: Rehabilitation  THERAPY DIAG:  Muscle weakness (generalized)  Radiculopathy, lumbosacral region  Pain in right leg  ONSET DATE: week before last (late April 2025)  SUBJECTIVE:                                                                                                                                                                                           SUBJECTIVE STATEMENT: " I need traction today" LBP and BIL hips. Radiating just with steps   PERTINENT HISTORY:  See above   PAIN:  Are you having pain? LBP and hips 3/10   PRECAUTIONS: None  RED FLAGS: None   WEIGHT BEARING RESTRICTIONS: No  FALLS:  Has patient fallen in last 6 months? Yes. Number of falls 1- fell over some plants she pulled inside to protect from frost, no FOF   LIVING ENVIRONMENT: Lives with: lives with their partner Lives in: House/apartment   OCCUPATION: travel agent- lots of sitting, lots of computer work, no ergonomic set up   PLOF: Independent, Independent with basic ADLs, Independent with gait, and Independent with transfers  PATIENT GOALS: feel better and quit hurting, strengthen   NEXT MD VISIT: Mina Alter July 8th  OBJECTIVE:  Note: Objective measures were completed at Evaluation unless otherwise  noted.  DIAGNOSTIC FINDINGS:  CLINICAL DATA:  Lumbar radiculopathy. History of degenerative disc disease. Right-sided low back pain with right leg pain and foot coldness 4 days. No known injury or prior relevant surgery.   EXAM: MRI LUMBAR SPINE WITHOUT CONTRAST   TECHNIQUE: Multiplanar, multisequence MR imaging of the lumbar spine was performed. No intravenous  contrast was administered.   COMPARISON:  Lumbar spine radiographs 03/24/2024. Abdominopelvic CTA 06/16/2023. Lumbar MRI 04/02/2021.   FINDINGS: Segmentation: Conventional anatomy assumed, with the last open disc space designated L5-S1.Concordant with prior imaging.   Alignment: Stable mild convex left scoliosis and mild chronic retrolisthesis at L2-3.   Vertebrae: No worrisome osseous lesion, acute fracture or pars defect. The visualized sacroiliac joints appear unremarkable.   Conus medullaris: Extends to the L1 level. The conus and cauda equina appear normal.   Paraspinal and other soft tissues: No significant paraspinal findings.   Disc levels:   Sagittal images demonstrate no significant disc space findings within the visualized lower thoracic spine.   L1-2: Stable mild loss of disc height with a chronic small left paracentral disc extrusion. Minimal mass effect on the thecal sac without foraminal narrowing or nerve root encroachment.   L2-3: Chronic loss of disc height with annular disc bulging and endplate osteophytes. Mild bilateral facet hypertrophy. Chronic mild lateral recess and foraminal narrowing bilaterally without nerve root encroachment. The spinal canal is patent.   L3-4: Chronic loss of disc height with annular disc bulging and endplate osteophytes asymmetric to the right. New small disc protrusion in the right subarticular zone with similar mild chronic right lateral recess narrowing. Unchanged mild right foraminal narrowing without L3 nerve root encroachment. The spinal canal and left foramen are patent.   L4-5: Preserved disc height and hydration. Mild facet hypertrophy. No spinal stenosis or significant foraminal narrowing.   L5-S1: Mild loss of disc height. There is a new right-sided disc extrusion with caudal migration of a disc fragment into the right S1 lateral recess. There is mass effect on the right S1 nerve root. Mild facet and  ligamentous hypertrophy without resulting foraminal narrowing or L5 nerve root encroachment. The spinal canal and left foramen are patent.   IMPRESSION: 1. New right-sided disc extrusion at L5-S1 with caudal migration of a disc fragment into the right S1 lateral recess and mass effect on the right S1 nerve root. 2. New small disc protrusion in the right subarticular zone at L3-4 with similar mild chronic right lateral recess and right foraminal narrowing. 3. Stable chronic mild lateral recess and foraminal narrowing bilaterally at L2-3. 4. No acute osseous findings or significant central stenosis.      COGNITION: Overall cognitive status: Within functional limits for tasks assessed     SENSATION: Not tested  MUSCLE LENGTH:  L HS and piriformis Min limitation R HS and piriformis Mod limitation  Sciatic nerve R very tense and immobile, (+) tension test   L LE significantly shorter than R, hx of scoliosis   POSTURE: rounded shoulders and forward head  PALPATION:  Lateral R thigh tender, R piriformis tender   LUMBAR ROM:   AROM eval  Flexion Mod limitation, increased pain   Extension WNL, REIS some centralization   Right lateral flexion WNL   Left lateral flexion WNL   Right rotation   Left rotation    (Blank rows = not tested)    LOWER EXTREMITY MMT:    MMT Right eval Left eval  Hip flexion 3+ 3+  Hip extension  Hip abduction 3 4  Hip adduction    Hip internal rotation    Hip external rotation 2+ 4-  Knee flexion    Knee extension    Ankle dorsiflexion    Ankle plantarflexion    Ankle inversion    Ankle eversion     (Blank rows = not tested)   TREATMENT DATE:   05/03/24 Dyna disc pelvic ROM 4 way 15 x each Scap stab on dyna disc 10 x 4 ways green tband Feet on ball bridge, KTC and obl Resisted trunk rotation 15 x BIL Hold ppt with PTA tactile cuing with resisted hip flex and abd Supine wt ball core stab Mech lumb traction 15 min  65#  05/01/24 Nustep L 5 10# cable pulleys shld et and row 2 sets 10 Black tband trunk ext and flex 20 x Lat pull down 20# 2 sets 10 Core stab ex 15 min PROM LE and trunk with NT stretching on LLE- issued NT for home  04/26/24 Nustep L 5 Step up opp leg ext 10 x BIL 6 inch then laterally with opp leg ABD Green tband shld ext, row and horz abd 15 x Hip 3 way red tband Bird Dog HEP Traction 60# 12 min  04/24/24 Nustep L 4 Resisted trunk flex and ext 20 x each Seated row and lat pull down 20# 2 sets 10 Upright row into trunk ext 2 sets 10 15# Reisted Gait backwar 5 x then 3 x each side 30# ( min A laterally d/t ankle) STS wt ball chest press 10 x then OH 10 x Seated green tband marching Supine green tband clams 20 x Supine red tband SLR 10 x then SLR with abd 10 x Feet on ball bridge, KTC,obl and iso abdominals ADD ball squeeze with bridge Standing hip flex,ext and abd 10 x BIL red tband PROM and stretching BIL LE and trunk   04/17/24 Checked Rt ankle, lateral swelling over lateral malleolus with slight discoloration , good ROM and MMT,tender laterally as expected Sitting on dyna disc 4 way pelvic ROM 15 x each Sitting on dyna disc hip flex and hip abd Red tband shld ext,row and ER on dyna disc 10 x each Core stab ex- feet on ball bridge,KTC,obl and iso abdominals Add ball squeeze,clams DL and SL with green tband , marching green tband SLR 10 x BIL SLR with abd 10 x BIL Mech lumb traction 15 min static 65#   04/13/24 PROM and stretching LEs and trunk NT stretching RT LE Core stab ex- feet on ball bridge,KTC,obl and iso abdominals Add ball squeeze,clams DL and SL with red tband , marching red tband SLR 10 x BIL SLR with abd 10 x BIL Mech lumb traction 15 min static 60# Discussed REIS to relief symptoms   04/03/24  Eval, HEP, POC  PATIENT EDUCATION:  Education details: exam findings, anatomy of region and how extension directional preference works/how movement affects her pain, POC, HEP  Person educated: Patient Education method: Explanation, Demonstration, and Handouts Education comprehension: verbalized understanding, returned demonstration, and needs further education  HOME EXERCISE PROGRAM: Access Code: [email or web address] URL: [email or web address] Date: 04/26/2024 Prepared by: Tristain Daily  Exercises - Shoulder extension with resistance - Neutral  - 1 x daily - 7 x weekly - 1 sets - 15 reps - Standing Shoulder Row with Anchored Resistance  - 1 x daily - 7 x weekly - 1 sets - 15 reps - Seated Shoulder Horizontal Abduction with Resistance - Palms Down  - 1 x daily - 7 x weekly - 1 sets - 15 reps - Bird Dog  - 1 x daily - 7 x weekly - 2 sets - 10 reps - 3 hold - Forward Step Up  - 1 x daily - 7 x weekly - 1 sets - 10 reps - Lateral Step Up  - 1 x daily - 7 x weekly - 1 sets - 10 reps - Standing Hip Flexion with Resistance Loop  - 1 x daily - 7 x weekly - 2 sets - 10 reps - 3 hold - Standing Hip Extension and Flexion with Resistance  - 1 x daily - 7 x weekly - 2 sets - 10 reps - 3 hold - Hip Abduction with Resistance Loop  - 1 x daily - 7 x weekly - 2 sets - 10 reps - 3 hold    Access Code: [email or web address] URL: [email or web address] Date: 04/03/2024 Prepared by: Terrel Ferries  Exercises - Standing Lumbar Extension at Wall - Forearms  - 2-3 x daily - 7 x weekly - 1 sets - 10 reps - Hooklying Hamstring Stretch with Strap  - 2-3 x daily - 7 x weekly - 1 sets - 3 reps - 30 seconds  hold - Supine Transversus Abdominis Bracing - Hands on Ground  - 5 x daily - 7 x weekly - 1 sets - 10 reps - 5 seconds  hold  ASSESSMENT:  CLINICAL IMPRESSION: progressed core stab with cuing. Tactile cuing to engage and hold/stab core with mvmt. She struggled to do this but saw that it decreased pain with core  engaged, asked her to be aware of stab with certain mvmts to give support and decrease pain Patient is a 58 y.o. F who was seen today for physical therapy evaluation and treatment for  Diagnosis  M51.16 (ICD-10-CM) - Intervertebral disc disorders with radiculopathy, lumbar region  . She was late to eval, so assessment was a bit limited. She did respond well to extension based movements, does have a lot of mm weakness in core and hips, also s/p abdominal surgery which is definitely causing some core weakness. Anticipate she will respond well to PT.   OBJECTIVE IMPAIRMENTS: decreased mobility, decreased ROM, decreased strength, hypomobility, increased fascial restrictions, increased muscle spasms, impaired flexibility, impaired sensation, improper body mechanics, postural dysfunction, and pain.   ACTIVITY LIMITATIONS: carrying, lifting, bending, and locomotion level  PARTICIPATION LIMITATIONS: meal prep, cleaning, laundry, shopping, community activity, occupation, and yard work  PERSONAL FACTORS: Age, Fitness, Past/current experiences, Social background, and Time since onset of injury/illness/exacerbation are also affecting patient's functional outcome.   REHAB POTENTIAL: Good  CLINICAL DECISION MAKING: Stable/uncomplicated  EVALUATION COMPLEXITY: Low   GOALS: Goals reviewed with patient? No  SHORT TERM GOALS: Target date: 04/24/2024    Will be compliant with appropriate progressive HEP GOAL STATUS:  progressing 04/13/24  5/27/25MET   2. Will demonstrate improved postural awareness with all functional tasks, use of ergonomic aides PRN/as desired GOAL STATUS: progressing 04/13/24   MET 04/24/24   3. Will demonstrate good functional biomechanics for bed mobility and floor to waist lifting mechanics   MET 04/24/24   LONG TERM GOALS: Target date: 05/15/2024   MMT to have improved by one grade all weak groups GOAL STATUS: 05/03/24 progressing  2. Mm flexibility and spasms to have improved  by at least 50% in order to improve functional movement patterns and for pain control GOAL STATUS: 05/03/24 progressing  3. Pain to be no more than 2/10 with all functional activities GOAL STATUS: Initial   4. Radicular symptoms to have resolved by at least 75%    05/03/24 progrssing      .  PLAN:  PT FREQUENCY: 2x/week  PT DURATION: 6 weeks  PLANNED INTERVENTIONS: 97750- Physical Performance Testing, 97110-Therapeutic exercises, 97530- Therapeutic activity, [id number]- Neuromuscular re-education, 97535- Self Care, 11914- Manual therapy, [id number]- Aquatic Therapy, [id number]- Traction (mechanical), Taping, and Dry Needling.  PLAN FOR NEXT SESSION: assess how she felt after core stab ex today Patient Details  Name: Melissa Cohen MRN: [id number] Date of Birth: 06-05-1966 Referring Provider:  Gavin Kast, FNP  Encounter Date: 05/03/2024   Aquilla Bayley, PTA 05/03/2024, 11:03 AM  [place]  [facility] at [facility] [address] [place], [place], [address] Phone: [phone]   Fax:  [phone]

## 2024-05-08 ENCOUNTER — Ambulatory Visit: Admitting: Physical Therapy

## 2024-05-08 DIAGNOSIS — M79604 Pain in right leg: Secondary | ICD-10-CM | POA: Diagnosis not present

## 2024-05-08 DIAGNOSIS — M6281 Muscle weakness (generalized): Secondary | ICD-10-CM

## 2024-05-08 DIAGNOSIS — M5417 Radiculopathy, lumbosacral region: Secondary | ICD-10-CM

## 2024-05-08 NOTE — Therapy (Signed)
 OUTPATIENT PHYSICAL THERAPY THORACOLUMBAR    Patient Name: Melissa Cohen MRN: [id number] DOB:1966/02/23, 58 y.o., female Today's Date: 05/08/2024  END OF SESSION:  PT End of Session - 05/08/24 1057     Visit Number 8    Number of Visits 13    Date for PT Re-Evaluation 05/15/24    Authorization Type [employer]    Authorization Time Period 04/03/24 to 05/15/24    PT Start Time 1100    PT Stop Time 1145    PT Time Calculation (min) 45 min             Past Medical History:  Diagnosis Date   Allergy    Anemia    Anxiety    Arthritis    Osteo arthritis - bil knees   Asthma    as a young adult related to allergy flare   Atypical nevi 08/25/2018   1. LEFT MID BACK (MILD) - NO TX, 2. RIGHT CHEST (SEVERE) - W/S   Atypical nevi 09/14/2018   MID BACK (SEVERE) - W/S   Atypical nevi 11/13/2018   LEFT POST. NECK INCIDENTRAL MOLE (MILD)   Atypical nevi 11/27/2018   RIGHT CHEST + MARGIN   Atypical nevus 01/02/2018   LEFT UPPER BACK (SEVERE) - W/S   Atypical nevus 01/04/2019   LEFT STERNUM ( MODERATE)   Atypical nevus 01/04/2019   LEFT POST NECK INF. (SEVERE)   Atypical nevus 01/04/2019   RECURRENT MID BACK - [facility] DR. PAVLIS   Back pain    Basal cell carcinoma 06/06/2008   LEFT UPPER SHOULDER PROX. @ [facility]   Blood transfusion without reported diagnosis    from [facility] several   Chest pain    Depression    Fatty liver    Gallbladder problem    Gastric ulcer    GERD (gastroesophageal reflux disease)    Heart murmur    Hyperlipidemia    Hypertension    Iron deficiency anemia    Joint pain    Knee pain    Lower extremity edema    Melanoma (HCC)    right cleavage, posterior left shoulder   MM (malignant melanoma of skin) (HCC) 09/14/2018   LEFT POST. NECK MIS - EXC   MM (malignant melanoma of skin) (HCC) 09/14/2018   RIGHT CHEST MIS - EXC   Obesity    Restless leg syndrome    Seizures (HCC)    2-3 d/t hypogylcermia - last seizure was 1998    Shortness of breath on exertion    Sleep apnea    sleep study showed mild sleep apnea - no c-papa needed   Ulcerative colitis    Dx at age 1; had been 30 years since her last flareup and then had a flareup recently in summer of 2017. Was then started back on Remicade  and is doing well since.   Vitamin D  deficiency    Past Surgical History:  Procedure Laterality Date   APPENDECTOMY     BREAST ENHANCEMENT SURGERY  2004   CHOLECYSTECTOMY  2011   COLONOSCOPY  2019   LAPAROTOMY N/A 06/17/2023   Procedure: EXPLORATORY LAPAROTOMY, REPAIR OF COLOTOMY;  Surgeon: Joyce Nixon, MD;  Location: [facility];  Service: General;  Laterality: N/A;   right eye lasix     02/17/12   TONSILLECTOMY AND ADENOIDECTOMY     TUBAL LIGATION     UPPER GASTROINTESTINAL ENDOSCOPY     Uterine ablation     WISDOM TOOTH EXTRACTION  Patient Active Problem List   Diagnosis Date Noted   Lumbar disc herniation with radiculopathy 04/04/2024   Seizures (HCC)    Restless leg syndrome    Hyperlipidemia    Sleep apnea    Perforation of colon as colonoscopy complication (HCC) 06/17/2023   Low back pain 05/13/2021   Adhesive capsulitis of left shoulder 04/01/2021   Metabolic syndrome 02/08/2021   At risk for impaired metabolic function 12/09/2020   Polyphagia 11/04/2020   Mood disorder, with emotional eating 11/04/2020   At risk for activity intolerance 11/04/2020   Vasovagal near syncope 03/26/2020   Frequent falls 02/13/2020   HTN (hypertension) 02/13/2020   Systolic murmur 03/26/2017   Chronic rhinitis 03/17/2016   Menopausal syndrome (hot flushes) 08/23/2014   Screening for malignant neoplasm of cervix 02/25/2014   Anxiety and depression 10/08/2011   Disorder of sacrum 04/12/2011   TROCHANTERIC BURSITIS, BILATERAL 04/12/2011   Benign neoplasm of skin 07/14/2010   Cervical radiculopathy 07/14/2010   GERD 12/31/2009   CARCINOMA, BASAL CELL 03/14/2008   Vitamin D  deficiency 03/06/2008   HYPERTRIGLYCERIDEMIA  03/06/2008   Ulcerative colitis (HCC) 02/09/2007   INSOMNIA 02/09/2007    PCP: Gavin Kast FNP   REFERRING PROVIDER: Conard Decent, MD  REFERRING DIAG:  Diagnosis  M51.16 (ICD-10-CM) - Intervertebral disc disorders with radiculopathy, lumbar region    Rationale for Evaluation and Treatment: Rehabilitation  THERAPY DIAG:  Muscle weakness (generalized)  Radiculopathy, lumbosacral region  Pain in right leg  ONSET DATE: week before last (late April 2025)  SUBJECTIVE:                                                                                                                                                                                           SUBJECTIVE STATEMENT:  doing good, soreness in mid back " feels like it needs popped"   PERTINENT HISTORY:  See above   PAIN:  Are you having pain? LBP 2/10   PRECAUTIONS: None  RED FLAGS: None   WEIGHT BEARING RESTRICTIONS: No  FALLS:  Has patient fallen in last 6 months? Yes. Number of falls 1- fell over some plants she pulled inside to protect from frost, no FOF   LIVING ENVIRONMENT: Lives with: lives with their partner Lives in: House/apartment   OCCUPATION: travel agent- lots of sitting, lots of computer work, no ergonomic set up   PLOF: Independent, Independent with basic ADLs, Independent with gait, and Independent with transfers  PATIENT GOALS: feel better and quit hurting, strengthen   NEXT MD VISIT: Mina Alter July 8th  OBJECTIVE:  Note: Objective measures were completed at Evaluation unless otherwise noted.  DIAGNOSTIC FINDINGS:  CLINICAL DATA:  Lumbar radiculopathy. History of degenerative disc disease. Right-sided low back pain with right leg pain and foot coldness 4 days. No known injury or prior relevant surgery.   EXAM: MRI LUMBAR SPINE WITHOUT CONTRAST   TECHNIQUE: Multiplanar, multisequence MR imaging of the lumbar spine was performed. No intravenous contrast was  administered.   COMPARISON:  Lumbar spine radiographs 03/24/2024. Abdominopelvic CTA 06/16/2023. Lumbar MRI 04/02/2021.   FINDINGS: Segmentation: Conventional anatomy assumed, with the last open disc space designated L5-S1.Concordant with prior imaging.   Alignment: Stable mild convex left scoliosis and mild chronic retrolisthesis at L2-3.   Vertebrae: No worrisome osseous lesion, acute fracture or pars defect. The visualized sacroiliac joints appear unremarkable.   Conus medullaris: Extends to the L1 level. The conus and cauda equina appear normal.   Paraspinal and other soft tissues: No significant paraspinal findings.   Disc levels:   Sagittal images demonstrate no significant disc space findings within the visualized lower thoracic spine.   L1-2: Stable mild loss of disc height with a chronic small left paracentral disc extrusion. Minimal mass effect on the thecal sac without foraminal narrowing or nerve root encroachment.   L2-3: Chronic loss of disc height with annular disc bulging and endplate osteophytes. Mild bilateral facet hypertrophy. Chronic mild lateral recess and foraminal narrowing bilaterally without nerve root encroachment. The spinal canal is patent.   L3-4: Chronic loss of disc height with annular disc bulging and endplate osteophytes asymmetric to the right. New small disc protrusion in the right subarticular zone with similar mild chronic right lateral recess narrowing. Unchanged mild right foraminal narrowing without L3 nerve root encroachment. The spinal canal and left foramen are patent.   L4-5: Preserved disc height and hydration. Mild facet hypertrophy. No spinal stenosis or significant foraminal narrowing.   L5-S1: Mild loss of disc height. There is a new right-sided disc extrusion with caudal migration of a disc fragment into the right S1 lateral recess. There is mass effect on the right S1 nerve root. Mild facet and ligamentous hypertrophy  without resulting foraminal narrowing or L5 nerve root encroachment. The spinal canal and left foramen are patent.   IMPRESSION: 1. New right-sided disc extrusion at L5-S1 with caudal migration of a disc fragment into the right S1 lateral recess and mass effect on the right S1 nerve root. 2. New small disc protrusion in the right subarticular zone at L3-4 with similar mild chronic right lateral recess and right foraminal narrowing. 3. Stable chronic mild lateral recess and foraminal narrowing bilaterally at L2-3. 4. No acute osseous findings or significant central stenosis.      COGNITION: Overall cognitive status: Within functional limits for tasks assessed     SENSATION: Not tested  MUSCLE LENGTH:  L HS and piriformis Min limitation R HS and piriformis Mod limitation  Sciatic nerve R very tense and immobile, (+) tension test   L LE significantly shorter than R, hx of scoliosis   POSTURE: rounded shoulders and forward head  PALPATION:  Lateral R thigh tender, R piriformis tender   LUMBAR ROM:   AROM eval  Flexion Mod limitation, increased pain   Extension WNL, REIS some centralization   Right lateral flexion WNL   Left lateral flexion WNL   Right rotation   Left rotation    (Blank rows = not tested)    LOWER EXTREMITY MMT:    MMT Right eval Left eval  Hip flexion 3+ 3+  Hip extension    Hip  abduction 3 4  Hip adduction    Hip internal rotation    Hip external rotation 2+ 4-  Knee flexion    Knee extension    Ankle dorsiflexion    Ankle plantarflexion    Ankle inversion    Ankle eversion     (Blank rows = not tested)   TREATMENT DATE:   05/08/24  BIKE L 4 DSTW to thor. Paraspinals. Seated thor stretching Feet on ball bridge,  obl and iso abdominals Resisted trunk rotation 15 x BIL Wt ball obl 10 x each Resisted AR press and rotation 10 x BIL each way 15# resited shld ext 2 sets 10 Leg Press hips 3 way 12 x 30# 3# OH walking  marching 20 feet 2 x 3# dead lift into upright row 10 x Green tband BIL hip ext and abd 15 x - pain on RT in stance and with ext  05/03/24 Dyna disc pelvic ROM 4 way 15 x each Scap stab on dyna disc 10 x 4 ways green tband Feet on ball bridge, KTC and obl Resisted trunk rotation 15 x BIL Hold ppt with PTA tactile cuing with resisted hip flex and abd Supine wt ball core stab Mech lumb traction 15 min 65#  05/01/24 Nustep L 5 10# cable pulleys shld et and row 2 sets 10 Black tband trunk ext and flex 20 x Lat pull down 20# 2 sets 10 Core stab ex 15 min PROM LE and trunk with NT stretching on LLE- issued NT for home  04/26/24 Nustep L 5 Step up opp leg ext 10 x BIL 6 inch then laterally with opp leg ABD Green tband shld ext, row and horz abd 15 x Hip 3 way red tband Bird Dog HEP Traction 60# 12 min  04/24/24 Nustep L 4 Resisted trunk flex and ext 20 x each Seated row and lat pull down 20# 2 sets 10 Upright row into trunk ext 2 sets 10 15# Reisted Gait backwar 5 x then 3 x each side 30# ( min A laterally d/t ankle) STS wt ball chest press 10 x then OH 10 x Seated green tband marching Supine green tband clams 20 x Supine red tband SLR 10 x then SLR with abd 10 x Feet on ball bridge, KTC,obl and iso abdominals ADD ball squeeze with bridge Standing hip flex,ext and abd 10 x BIL red tband PROM and stretching BIL LE and trunk   04/17/24 Checked Rt ankle, lateral swelling over lateral malleolus with slight discoloration , good ROM and MMT,tender laterally as expected Sitting on dyna disc 4 way pelvic ROM 15 x each Sitting on dyna disc hip flex and hip abd Red tband shld ext,row and ER on dyna disc 10 x each Core stab ex- feet on ball bridge,KTC,obl and iso abdominals Add ball squeeze,clams DL and SL with green tband , marching green tband SLR 10 x BIL SLR with abd 10 x BIL Mech lumb traction 15 min static 65#   04/13/24 PROM and stretching LEs and trunk NT  stretching RT LE Core stab ex- feet on ball bridge,KTC,obl and iso abdominals Add ball squeeze,clams DL and SL with red tband , marching red tband SLR 10 x BIL SLR with abd 10 x BIL Mech lumb traction 15 min static 60# Discussed REIS to relief symptoms   04/03/24  Eval, HEP, POC  PATIENT EDUCATION:  Education details: exam findings, anatomy of region and how extension directional preference works/how movement affects her pain, POC, HEP  Person educated: Patient Education method: Explanation, Demonstration, and Handouts Education comprehension: verbalized understanding, returned demonstration, and needs further education  HOME EXERCISE PROGRAM: Access Code: [email or web address] URL: [email or web address] Date: 04/26/2024 Prepared by: Advay Volante  Exercises - Shoulder extension with resistance - Neutral  - 1 x daily - 7 x weekly - 1 sets - 15 reps - Standing Shoulder Row with Anchored Resistance  - 1 x daily - 7 x weekly - 1 sets - 15 reps - Seated Shoulder Horizontal Abduction with Resistance - Palms Down  - 1 x daily - 7 x weekly - 1 sets - 15 reps - Bird Dog  - 1 x daily - 7 x weekly - 2 sets - 10 reps - 3 hold - Forward Step Up  - 1 x daily - 7 x weekly - 1 sets - 10 reps - Lateral Step Up  - 1 x daily - 7 x weekly - 1 sets - 10 reps - Standing Hip Flexion with Resistance Loop  - 1 x daily - 7 x weekly - 2 sets - 10 reps - 3 hold - Standing Hip Extension and Flexion with Resistance  - 1 x daily - 7 x weekly - 2 sets - 10 reps - 3 hold - Hip Abduction with Resistance Loop  - 1 x daily - 7 x weekly - 2 sets - 10 reps - 3 hold    Access Code: [email or web address] URL: [email or web address] Date: 04/03/2024 Prepared by: Terrel Ferries  Exercises - Standing Lumbar Extension at Wall - Forearms  - 2-3 x daily - 7 x weekly - 1 sets - 10 reps -  Hooklying Hamstring Stretch with Strap  - 2-3 x daily - 7 x weekly - 1 sets - 3 reps - 30 seconds  hold - Supine Transversus Abdominis Bracing - Hands on Ground  - 5 x daily - 7 x weekly - 1 sets - 10 reps - 5 seconds  hold  ASSESSMENT:  CLINICAL IMPRESSION: progressed core stab with cuing to stay enaged thru abdominals. Pt did well with just some shld fatigue. Weakness in hip extensors noted. Pt felt tight in thoracic area so DSTW and stretching to help with pain/stiffness OBJECTIVE IMPAIRMENTS: decreased mobility, decreased ROM, decreased strength, hypomobility, increased fascial restrictions, increased muscle spasms, impaired flexibility, impaired sensation, improper body mechanics, postural dysfunction, and pain.   ACTIVITY LIMITATIONS: carrying, lifting, bending, and locomotion level  PARTICIPATION LIMITATIONS: meal prep, cleaning, laundry, shopping, community activity, occupation, and yard work  PERSONAL FACTORS: Age, Fitness, Past/current experiences, Social background, and Time since onset of injury/illness/exacerbation are also affecting patient's functional outcome.   REHAB POTENTIAL: Good  CLINICAL DECISION MAKING: Stable/uncomplicated  EVALUATION COMPLEXITY: Low   GOALS: Goals reviewed with patient? No  SHORT TERM GOALS: Target date: 04/24/2024    Will be compliant with appropriate progressive HEP GOAL STATUS: progressing 04/13/24  5/27/25MET   2. Will demonstrate improved postural awareness with all functional tasks, use of ergonomic aides PRN/as desired GOAL STATUS: progressing 04/13/24   MET 04/24/24   3. Will demonstrate good functional biomechanics for bed mobility and floor to waist lifting mechanics   MET 04/24/24   LONG TERM GOALS: Target date: 05/15/2024   MMT to have improved by one grade all weak groups GOAL STATUS: 05/03/24 progressing  2. Mm flexibility and spasms to have improved by at least 50% in  order to improve functional movement patterns and for pain  control GOAL STATUS: 05/03/24 progressing  3. Pain to be no more than 2/10 with all functional activities GOAL STATUS: progressing 05/08/24   4. Radicular symptoms to have resolved by at least 75%    05/03/24 progressing and 05/08/24      .  PLAN:  PT FREQUENCY: 2x/week  PT DURATION: 6 weeks  PLANNED INTERVENTIONS: 97750- Physical Performance Testing, 97110-Therapeutic exercises, 97530- Therapeutic activity, [id number]- Neuromuscular re-education, 97535- Self Care, [id number]- Manual therapy, [id number]- Aquatic Therapy, [id number]- Traction (mechanical), Taping, and Dry Needling.  PLAN FOR NEXT SESSION: pt leaves Saturday for 2 weeks vacation Patient Details  Name: FREDI HURTADO MRN: [id number] Date of Birth: 09/10/1966 Referring Provider:  Gavin Kast, FNP  Encounter Date: 05/08/2024   Aquilla Bayley, PTA 05/08/2024, 10:58 AM  [facility] [address] [place], [place], [address] Phone: [phone]   Fax:  [phone]

## 2024-05-10 ENCOUNTER — Ambulatory Visit: Admitting: Physical Therapy

## 2024-05-29 DIAGNOSIS — M5416 Radiculopathy, lumbar region: Secondary | ICD-10-CM | POA: Diagnosis not present

## 2024-05-29 DIAGNOSIS — Z6829 Body mass index (BMI) 29.0-29.9, adult: Secondary | ICD-10-CM | POA: Diagnosis not present

## 2024-05-31 ENCOUNTER — Ambulatory Visit: Attending: Internal Medicine | Admitting: Physical Therapy

## 2024-05-31 DIAGNOSIS — E663 Overweight: Secondary | ICD-10-CM | POA: Diagnosis not present

## 2024-05-31 DIAGNOSIS — R632 Polyphagia: Secondary | ICD-10-CM | POA: Diagnosis not present

## 2024-05-31 DIAGNOSIS — Z9884 Bariatric surgery status: Secondary | ICD-10-CM | POA: Diagnosis not present

## 2024-05-31 DIAGNOSIS — M6281 Muscle weakness (generalized): Secondary | ICD-10-CM | POA: Insufficient documentation

## 2024-05-31 DIAGNOSIS — M5126 Other intervertebral disc displacement, lumbar region: Secondary | ICD-10-CM | POA: Diagnosis not present

## 2024-05-31 DIAGNOSIS — M79604 Pain in right leg: Secondary | ICD-10-CM | POA: Diagnosis present

## 2024-05-31 DIAGNOSIS — M5417 Radiculopathy, lumbosacral region: Secondary | ICD-10-CM | POA: Diagnosis present

## 2024-05-31 NOTE — Therapy (Signed)
 OUTPATIENT PHYSICAL THERAPY THORACOLUMBAR    Patient Name: Melissa Cohen MRN: [id number] DOB:[birth year], 58 y.o., female Today's Date: 05/31/2024  END OF SESSION:  PT End of Session - 05/31/24 0929     Visit Number 9    Date for PT Re-Evaluation 07/01/24    PT Start Time 0930    PT Stop Time 1010    PT Time Calculation (min) 40 min          Past Medical History:  Diagnosis Date   Allergy    Anemia    Anxiety    Arthritis    Osteo arthritis - bil knees   Asthma    as a young adult related to allergy flare   Atypical nevi 08/25/2018   1. LEFT MID BACK (MILD) - NO TX, 2. RIGHT CHEST (SEVERE) - W/S   Atypical nevi 09/14/2018   MID BACK (SEVERE) - W/S   Atypical nevi 11/13/2018   LEFT POST. NECK INCIDENTRAL MOLE (MILD)   Atypical nevi 11/27/2018   RIGHT CHEST + MARGIN   Atypical nevus 01/02/2018   LEFT UPPER BACK (SEVERE) - W/S   Atypical nevus 01/04/2019   LEFT STERNUM ( MODERATE)   Atypical nevus 01/04/2019   LEFT POST NECK INF. (SEVERE)   Atypical nevus 01/04/2019   RECURRENT MID BACK - [facility] DR. PAVLIS   Back pain    Basal cell carcinoma 06/06/2008   LEFT UPPER SHOULDER PROX. @ [facility]   Blood transfusion without reported diagnosis    from [facility] several   Chest pain    Depression    Fatty liver    Gallbladder problem    Gastric ulcer    GERD (gastroesophageal reflux disease)    Heart murmur    Hyperlipidemia    Hypertension    Iron deficiency anemia    Joint pain    Knee pain    Lower extremity edema    Melanoma (HCC)    right cleavage, posterior left shoulder   MM (malignant melanoma of skin) (HCC) 09/14/2018   LEFT POST. 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 Was then started back on Remicade  and is doing well since.   Vitamin D  deficiency    Past Surgical History:  Procedure Laterality Date   APPENDECTOMY     BREAST ENHANCEMENT SURGERY  2004   CHOLECYSTECTOMY  2011   COLONOSCOPY  2019   LAPAROTOMY N/A 06/17/2023   Procedure: EXPLORATORY LAPAROTOMY, REPAIR OF COLOTOMY;  Surgeon: Debby Hila, MD;  Location: [facility];  Service: General;  Laterality: N/A;   right eye lasix     02/17/12   TONSILLECTOMY AND ADENOIDECTOMY     TUBAL LIGATION     UPPER GASTROINTESTINAL ENDOSCOPY     Uterine ablation     WISDOM TOOTH EXTRACTION     Patient Active Problem List   Diagnosis Date Noted   Lumbar disc herniation with radiculopathy 04/04/2024   Seizures (HCC)  Restless leg syndrome    Hyperlipidemia    Sleep apnea    Perforation of colon as colonoscopy complication (HCC) 06/17/2023   Low back pain 05/13/2021   Adhesive capsulitis of left shoulder 04/01/2021   Metabolic syndrome 02/08/2021   At risk for impaired metabolic function 12/09/2020   Polyphagia 11/04/2020   Mood disorder, with emotional eating 11/04/2020   At risk for activity intolerance 11/04/2020   Vasovagal near syncope 03/26/2020   Frequent falls 02/13/2020   HTN (hypertension) 02/13/2020   Systolic murmur 03/26/2017   Chronic rhinitis 03/17/2016   Menopausal syndrome (hot flushes) 08/23/2014   Screening for malignant neoplasm of cervix 02/25/2014   Anxiety and depression 10/08/2011   Disorder of sacrum 04/12/2011   TROCHANTERIC BURSITIS, BILATERAL 04/12/2011   Benign neoplasm of skin 07/14/2010   Cervical radiculopathy 07/14/2010   GERD 12/31/2009   CARCINOMA, BASAL CELL 03/14/2008   Vitamin D  deficiency 03/06/2008   HYPERTRIGLYCERIDEMIA 03/06/2008   Ulcerative colitis (HCC) 02/09/2007   INSOMNIA 02/09/2007    PCP: Billy Knee FNP    REFERRING PROVIDER: Scharlene Sheena Charleston, MD  REFERRING DIAG:  Diagnosis  M51.16 (ICD-10-CM) - Intervertebral disc disorders with radiculopathy, lumbar region    Rationale for Evaluation and Treatment: Rehabilitation  THERAPY DIAG:  Muscle weakness (generalized)  Radiculopathy, lumbosacral region  ONSET DATE: week before last (late April 2025)  SUBJECTIVE:                                                                                                                                                                                           SUBJECTIVE STATEMENT:  been on vacation, last couple days pain in RT LB and radiating out to hip, as day goes on radiating pain decreases   PERTINENT HISTORY:  See above   PAIN:  Are you having pain? RT LBP 1-2/10   PRECAUTIONS: None  RED FLAGS: None   WEIGHT BEARING RESTRICTIONS: No  FALLS:  Has patient fallen in last 6 months? Yes. Number of falls 1- fell over some plants she pulled inside to protect from frost, no FOF   LIVING ENVIRONMENT: Lives with: lives with their partner Lives in: House/apartment   OCCUPATION: travel agent- lots of sitting, lots of computer work, no ergonomic set up   PLOF: Independent, Independent with basic ADLs, Independent with gait, and Independent with transfers  PATIENT GOALS: feel better and quit hurting, strengthen   NEXT MD VISIT: Scharlene July 8th  OBJECTIVE:  Note: Objective measures were completed at Evaluation unless otherwise noted.  DIAGNOSTIC FINDINGS:  CLINICAL DATA:  Lumbar radiculopathy. History of degenerative disc disease. Right-sided low back pain with  right leg pain and foot coldness 4 days. No known injury or prior relevant surgery.   EXAM: MRI LUMBAR SPINE WITHOUT CONTRAST   TECHNIQUE: Multiplanar, multisequence MR imaging of the lumbar spine was performed. No intravenous contrast was administered.   COMPARISON:  Lumbar spine radiographs 03/24/2024.  Abdominopelvic CTA 06/16/2023. Lumbar MRI 04/02/2021.   FINDINGS: Segmentation: Conventional anatomy assumed, with the last open disc space designated L5-S1.Concordant with prior imaging.   Alignment: Stable mild convex left scoliosis and mild chronic retrolisthesis at L2-3.   Vertebrae: No worrisome osseous lesion, acute fracture or pars defect. The visualized sacroiliac joints appear unremarkable.   Conus medullaris: Extends to the L1 level. The conus and cauda equina appear normal.   Paraspinal and other soft tissues: No significant paraspinal findings.   Disc levels:   Sagittal images demonstrate no significant disc space findings within the visualized lower thoracic spine.   L1-2: Stable mild loss of disc height with a chronic small left paracentral disc extrusion. Minimal mass effect on the thecal sac without foraminal narrowing or nerve root encroachment.   L2-3: Chronic loss of disc height with annular disc bulging and endplate osteophytes. Mild bilateral facet hypertrophy. Chronic mild lateral recess and foraminal narrowing bilaterally without nerve root encroachment. The spinal canal is patent.   L3-4: Chronic loss of disc height with annular disc bulging and endplate osteophytes asymmetric to the right. New small disc protrusion in the right subarticular zone with similar mild chronic right lateral recess narrowing. Unchanged mild right foraminal narrowing without L3 nerve root encroachment. The spinal canal and left foramen are patent.   L4-5: Preserved disc height and hydration. Mild facet hypertrophy. No spinal stenosis or significant foraminal narrowing.   L5-S1: Mild loss of disc height. There is a new right-sided disc extrusion with caudal migration of a disc fragment into the right S1 lateral recess. There is mass effect on the right S1 nerve root. Mild facet and ligamentous hypertrophy without resulting foraminal narrowing or L5 nerve root  encroachment. The spinal canal and left foramen are patent.   IMPRESSION: 1. New right-sided disc extrusion at L5-S1 with caudal migration of a disc fragment into the right S1 lateral recess and mass effect on the right S1 nerve root. 2. New small disc protrusion in the right subarticular zone at L3-4 with similar mild chronic right lateral recess and right foraminal narrowing. 3. Stable chronic mild lateral recess and foraminal narrowing bilaterally at L2-3. 4. No acute osseous findings or significant central stenosis.      COGNITION: Overall cognitive status: Within functional limits for tasks assessed     SENSATION: Not tested  MUSCLE LENGTH:  L HS and piriformis Min limitation R HS and piriformis Mod limitation  Sciatic nerve R very tense and immobile, (+) tension test   L LE significantly shorter than R, hx of scoliosis   POSTURE: rounded shoulders and forward head  PALPATION:  Lateral R thigh tender, R piriformis tender   LUMBAR ROM:   AROM eval  Flexion Mod limitation, increased pain   Extension WNL, REIS some centralization   Right lateral flexion WNL   Left lateral flexion WNL   Right rotation   Left rotation    (Blank rows = not tested)    LOWER EXTREMITY MMT:    MMT Right eval Left eval  Hip flexion 3+ 3+  Hip extension    Hip abduction 3 4  Hip adduction    Hip internal rotation    Hip external rotation  2+ 4-  Knee flexion    Knee extension    Ankle dorsiflexion    Ankle plantarflexion    Ankle inversion    Ankle eversion     (Blank rows = not tested)   TREATMENT DATE:   06/01/23 PROM/stretching RT LE STW with stripping RT ITB- instructed in rolling at home with partner 6 inch lateral step up with opp leg ABD 6 inch step up with hip circles 10 x each way BIL 6 inch hip hiking 2 sets 10 BIL Supine bridge with ball squeeze 2 sets 10 Bridge with green tband clams Ppt with green tband marching 20 x alt 2 x IONTO RT GT 1.1 cc 4  hour leave on patch  05/08/24  BIKE L 4 DSTW to thor. Paraspinals. Seated thor stretching Feet on ball bridge,  obl and iso abdominals Resisted trunk rotation 15 x BIL Wt ball obl 10 x each Resisted AR press and rotation 10 x BIL each way 15# resited shld ext 2 sets 10 Leg Press hips 3 way 12 x 30# 3# OH walking marching 20 feet 2 x 3# dead lift into upright row 10 x Green tband BIL hip ext and abd 15 x - pain on RT in stance and with ext  05/03/24 Dyna disc pelvic ROM 4 way 15 x each Scap stab on dyna disc 10 x 4 ways green tband Feet on ball bridge, KTC and obl Resisted trunk rotation 15 x BIL Hold ppt with PTA tactile cuing with resisted hip flex and abd Supine wt ball core stab Mech lumb traction 15 min 65#  05/01/24 Nustep L 5 10# cable pulleys shld et and row 2 sets 10 Black tband trunk ext and flex 20 x Lat pull down 20# 2 sets 10 Core stab ex 15 min PROM LE and trunk with NT stretching on LLE- issued NT for home  04/26/24 Nustep L 5 Step up opp leg ext 10 x BIL 6 inch then laterally with opp leg ABD Green tband shld ext, row and horz abd 15 x Hip 3 way red tband Bird Dog HEP Traction 60# 12 min  04/24/24 Nustep L 4 Resisted trunk flex and ext 20 x each Seated row and lat pull down 20# 2 sets 10 Upright row into trunk ext 2 sets 10 15# Reisted Gait backwar 5 x then 3 x each side 30# ( min A laterally d/t ankle) STS wt ball chest press 10 x then OH 10 x Seated green tband marching Supine green tband clams 20 x Supine red tband SLR 10 x then SLR with abd 10 x Feet on ball bridge, KTC,obl and iso abdominals ADD ball squeeze with bridge Standing hip flex,ext and abd 10 x BIL red tband PROM and stretching BIL LE and trunk   04/17/24 Checked Rt ankle, lateral swelling over lateral malleolus with slight discoloration , good ROM and MMT,tender laterally as expected Sitting on dyna disc 4 way pelvic ROM 15 x each Sitting on dyna disc hip flex and  hip abd Red tband shld ext,row and ER on dyna disc 10 x each Core stab ex- feet on ball bridge,KTC,obl and iso abdominals Add ball squeeze,clams DL and SL with green tband , marching green tband SLR 10 x BIL SLR with abd 10 x BIL Mech lumb traction 15 min static 65#   04/13/24 PROM and stretching LEs and trunk NT stretching RT LE Core stab ex- feet on ball bridge,KTC,obl and iso abdominals  Add ball squeeze,clams DL and SL with red tband , marching red tband SLR 10 x BIL SLR with abd 10 x BIL Mech lumb traction 15 min static 60# Discussed REIS to relief symptoms   04/03/24  Eval, HEP, POC                                                                                                                                   PATIENT EDUCATION:  Education details: exam findings, anatomy of region and how extension directional preference works/how movement affects her pain, POC, HEP  Person educated: Patient Education method: Explanation, Demonstration, and Handouts Education comprehension: verbalized understanding, returned demonstration, and needs further education  HOME EXERCISE PROGRAM: Access Code: [email or web address] URL: [email or web address] Date: 04/26/2024 Prepared by: Khadeejah Castner  Exercises - Shoulder extension with resistance - Neutral  - 1 x daily - 7 x weekly - 1 sets - 15 reps - Standing Shoulder Row with Anchored Resistance  - 1 x daily - 7 x weekly - 1 sets - 15 reps - Seated Shoulder Horizontal Abduction with Resistance - Palms Down  - 1 x daily - 7 x weekly - 1 sets - 15 reps - Bird Dog  - 1 x daily - 7 x weekly - 2 sets - 10 reps - 3 hold - Forward Step Up  - 1 x daily - 7 x weekly - 1 sets - 10 reps - Lateral Step Up  - 1 x daily - 7 x weekly - 1 sets - 10 reps - Standing Hip Flexion with Resistance Loop  - 1 x daily - 7 x weekly - 2 sets - 10 reps - 3 hold - Standing Hip Extension and Flexion with Resistance  - 1 x daily - 7 x weekly - 2 sets - 10 reps - 3  hold - Hip Abduction with Resistance Loop  - 1 x daily - 7 x weekly - 2 sets - 10 reps - 3 hold    Access Code: [email or web address] URL: [email or web address] Date: 04/03/2024 Prepared by: Josette Rough  Exercises - Standing Lumbar Extension at Wall - Forearms  - 2-3 x daily - 7 x weekly - 1 sets - 10 reps - Hooklying Hamstring Stretch with Strap  - 2-3 x daily - 7 x weekly - 1 sets - 3 reps - 30 seconds  hold - Supine Transversus Abdominis Bracing - Hands on Ground  - 5 x daily - 7 x weekly - 1 sets - 10 reps - 5 seconds  hold  ASSESSMENT:  CLINICAL IMPRESSION: pt returns from vacation, overall doing well but stated last couple days of vacation back pain returns RT side with radiating into RT hip that improves as day goes on. Pt with ITB pain, tightness and tenderness- instructed in home rolling with partner or foam roller. RT hip ADB weakness noted . Added trial of into to RT hip GT.    OBJECTIVE IMPAIRMENTS: decreased  mobility, decreased ROM, decreased strength, hypomobility, increased fascial restrictions, increased muscle spasms, impaired flexibility, impaired sensation, improper body mechanics, postural dysfunction, and pain.   ACTIVITY LIMITATIONS: carrying, lifting, bending, and locomotion level  PARTICIPATION LIMITATIONS: meal prep, cleaning, laundry, shopping, community activity, occupation, and yard work  PERSONAL FACTORS: Age, Fitness, Past/current experiences, Social background, and Time since onset of injury/illness/exacerbation are also affecting patient's functional outcome.   REHAB POTENTIAL: Good  CLINICAL DECISION MAKING: Stable/uncomplicated  EVALUATION COMPLEXITY: Low   GOALS: Goals reviewed with patient? No  SHORT TERM GOALS: Target date: 06/14/2024      Will be compliant with appropriate progressive HEP GOAL STATUS: progressing 04/13/24  5/27/25MET   2. Will demonstrate improved postural awareness with all functional tasks, use of ergonomic aides  PRN/as desired GOAL STATUS: progressing 04/13/24   MET 04/24/24   3. Will demonstrate good functional biomechanics for bed mobility and floor to waist lifting mechanics   MET 04/24/24   LONG TERM GOALS: Target date: 06/28/2024     MMT to have improved by one grade all weak groups GOAL STATUS: 05/03/24 progressing   05/31/24 progressing HIP ABD weakness  2. Mm flexibility and spasms to have improved by at least 50% in order to improve functional movement patterns and for pain control GOAL STATUS: 05/03/24 progressing   05/31/24  MET  3. Pain to be no more than 2/10 with all functional activities GOAL STATUS: progressing 05/08/24   05/31/24 progressing   4. Radicular symptoms to have resolved by at least 75%    05/03/24 progressing and 05/08/24  and 05/31/24      .  PLAN:  PT FREQUENCY: 2x/week  PT DURATION: 4 weeks  PLANNED INTERVENTIONS: 97750- Physical Performance Testing, 97110-Therapeutic exercises, 97530- Therapeutic activity, [id number]- Neuromuscular re-education, 97535- Self Care, [id number]- Manual therapy, [id number]- Aquatic Therapy, [id number]- Traction (mechanical), [id number]- Ionotophoresis 4mg /ml Dexamethasone , 79439 (1-2 muscles), 20561 (3+ muscles)- Dry Needling, and Taping.  PLAN FOR NEXT SESSION: assess ionto and foam rolling Patient Details  Name: Melissa Cohen MRN: [id number] Date of Birth: 1966-07-31 Referring Provider:  Billy Knee, FNP  Encounter Date: 05/31/2024   MARSH SNIFF, PTA 05/31/2024, 10:02 AM  [facility] [address] [place], [place], [address] Phone: [phone]   Fax:  [phone]

## 2024-06-05 ENCOUNTER — Ambulatory Visit: Admitting: Physical Therapy

## 2024-06-05 DIAGNOSIS — M6281 Muscle weakness (generalized): Secondary | ICD-10-CM

## 2024-06-05 DIAGNOSIS — M5417 Radiculopathy, lumbosacral region: Secondary | ICD-10-CM

## 2024-06-05 DIAGNOSIS — M79604 Pain in right leg: Secondary | ICD-10-CM

## 2024-06-05 NOTE — Therapy (Signed)
 OUTPATIENT PHYSICAL THERAPY THORACOLUMBAR    Patient Name: Melissa Cohen MRN: [id number] DOB:Jun 27, 1966, 58 y.o., female Today's Date: 06/05/2024  END OF SESSION:  PT End of Session - 06/05/24 1146     Visit Number 10    Date for PT Re-Evaluation 07/01/24    Authorization Type [employer]    Authorization Time Period 04/03/24 to 05/15/24    PT Start Time 1145    PT Stop Time 1230    PT Time Calculation (min) 45 min          Past Medical History:  Diagnosis Date   Allergy    Anemia    Anxiety    Arthritis    Osteo arthritis - bil knees   Asthma    as a young adult related to allergy flare   Atypical nevi 08/25/2018   1. LEFT MID BACK (MILD) - NO TX, 2. RIGHT CHEST (SEVERE) - W/S   Atypical nevi 09/14/2018   MID BACK (SEVERE) - W/S   Atypical nevi 11/13/2018   LEFT POST. NECK INCIDENTRAL MOLE (MILD)   Atypical nevi 11/27/2018   RIGHT CHEST + MARGIN   Atypical nevus 01/02/2018   LEFT UPPER BACK (SEVERE) - W/S   Atypical nevus 01/04/2019   LEFT STERNUM ( MODERATE)   Atypical nevus 01/04/2019   LEFT POST NECK INF. (SEVERE)   Atypical nevus 01/04/2019   RECURRENT MID BACK - [facility] DR. PAVLIS   Back pain    Basal cell carcinoma 06/06/2008   LEFT UPPER SHOULDER PROX. @ [facility]   Blood transfusion without reported diagnosis    from [facility] several   Chest pain    Depression    Fatty liver    Gallbladder problem    Gastric ulcer    GERD (gastroesophageal reflux disease)    Heart murmur    Hyperlipidemia    Hypertension    Iron deficiency anemia    Joint pain    Knee pain    Lower extremity edema    Melanoma (HCC)    right cleavage, posterior left shoulder   MM (malignant melanoma of skin) (HCC) 09/14/2018   LEFT POST. NECK MIS - EXC   MM (malignant melanoma of skin) (HCC) 09/14/2018   RIGHT CHEST MIS - EXC   Obesity    Restless leg syndrome    Seizures (HCC)    2-3 d/t hypogylcermia - last seizure was 1998   Shortness of breath on exertion     Sleep apnea    sleep study showed mild sleep apnea - no c-papa needed   Ulcerative colitis    Dx at age 50; had been 30 years since her last flareup and then had a flareup recently in summer of 2017. Was then started back on Remicade  and is doing well since.   Vitamin D  deficiency    Past Surgical History:  Procedure Laterality Date   APPENDECTOMY     BREAST ENHANCEMENT SURGERY  2004   CHOLECYSTECTOMY  2011   COLONOSCOPY  2019   LAPAROTOMY N/A 06/17/2023   Procedure: EXPLORATORY LAPAROTOMY, REPAIR OF COLOTOMY;  Surgeon: Debby Hila, MD;  Location: [facility];  Service: General;  Laterality: N/A;   right eye lasix     02/17/12   TONSILLECTOMY AND ADENOIDECTOMY     TUBAL LIGATION     UPPER GASTROINTESTINAL ENDOSCOPY     Uterine ablation     WISDOM TOOTH EXTRACTION     Patient Active Problem List  Diagnosis Date Noted   Lumbar disc herniation with radiculopathy 04/04/2024   Seizures (HCC)    Restless leg syndrome    Hyperlipidemia    Sleep apnea    Perforation of colon as colonoscopy complication (HCC) 06/17/2023   Low back pain 05/13/2021   Adhesive capsulitis of left shoulder 04/01/2021   Metabolic syndrome 02/08/2021   At risk for impaired metabolic function 12/09/2020   Polyphagia 11/04/2020   Mood disorder, with emotional eating 11/04/2020   At risk for activity intolerance 11/04/2020   Vasovagal near syncope 03/26/2020   Frequent falls 02/13/2020   HTN (hypertension) 02/13/2020   Systolic murmur 03/26/2017   Chronic rhinitis 03/17/2016   Menopausal syndrome (hot flushes) 08/23/2014   Screening for malignant neoplasm of cervix 02/25/2014   Anxiety and depression 10/08/2011   Disorder of sacrum 04/12/2011   TROCHANTERIC BURSITIS, BILATERAL 04/12/2011   Benign neoplasm of skin 07/14/2010   Cervical radiculopathy 07/14/2010   GERD 12/31/2009   CARCINOMA, BASAL CELL 03/14/2008   Vitamin D  deficiency 03/06/2008   HYPERTRIGLYCERIDEMIA 03/06/2008   Ulcerative colitis  (HCC) 02/09/2007   INSOMNIA 02/09/2007    PCP: Billy Knee FNP   REFERRING PROVIDER: Scharlene Sheena Charleston, MD  REFERRING DIAG:  Diagnosis  M51.16 (ICD-10-CM) - Intervertebral disc disorders with radiculopathy, lumbar region    Rationale for Evaluation and Treatment: Rehabilitation  THERAPY DIAG:  Muscle weakness (generalized)  Radiculopathy, lumbosacral region  Pain in right leg  ONSET DATE: week before last (late April 2025)  SUBJECTIVE:                                                                                                                                                                                           SUBJECTIVE STATEMENT:  leg does not hurt today, stretching as I can, partner did not rub Back comes and goes and today on left vs rt PERTINENT HISTORY:  See above   PAIN:  Are you having pain? RT LBP 1-2/10   PRECAUTIONS: None  RED FLAGS: None   WEIGHT BEARING RESTRICTIONS: No  FALLS:  Has patient fallen in last 6 months? Yes. Number of falls 1- fell over some plants she pulled inside to protect from frost, no FOF   LIVING ENVIRONMENT: Lives with: lives with their partner Lives in: House/apartment   OCCUPATION: travel agent- lots of sitting, lots of computer work, no ergonomic set up   PLOF: Independent, Independent with basic ADLs, Independent with gait, and Independent with transfers  PATIENT GOALS: feel better and quit hurting, strengthen   NEXT MD VISIT: Scharlene July 8th  OBJECTIVE:  Note: Objective measures were completed at Evaluation  unless otherwise noted.  DIAGNOSTIC FINDINGS:  CLINICAL DATA:  Lumbar radiculopathy. History of degenerative disc disease. Right-sided low back pain with right leg pain and foot coldness 4 days. No known injury or prior relevant surgery.   EXAM: MRI LUMBAR SPINE WITHOUT CONTRAST   TECHNIQUE: Multiplanar, multisequence MR imaging of the lumbar spine was performed. No intravenous  contrast was administered.   COMPARISON:  Lumbar spine radiographs 03/24/2024. Abdominopelvic CTA 06/16/2023. Lumbar MRI 04/02/2021.   FINDINGS: Segmentation: Conventional anatomy assumed, with the last open disc space designated L5-S1.Concordant with prior imaging.   Alignment: Stable mild convex left scoliosis and mild chronic retrolisthesis at L2-3.   Vertebrae: No worrisome osseous lesion, acute fracture or pars defect. The visualized sacroiliac joints appear unremarkable.   Conus medullaris: Extends to the L1 level. The conus and cauda equina appear normal.   Paraspinal and other soft tissues: No significant paraspinal findings.   Disc levels:   Sagittal images demonstrate no significant disc space findings within the visualized lower thoracic spine.   L1-2: Stable mild loss of disc height with a chronic small left paracentral disc extrusion. Minimal mass effect on the thecal sac without foraminal narrowing or nerve root encroachment.   L2-3: Chronic loss of disc height with annular disc bulging and endplate osteophytes. Mild bilateral facet hypertrophy. Chronic mild lateral recess and foraminal narrowing bilaterally without nerve root encroachment. The spinal canal is patent.   L3-4: Chronic loss of disc height with annular disc bulging and endplate osteophytes asymmetric to the right. New small disc protrusion in the right subarticular zone with similar mild chronic right lateral recess narrowing. Unchanged mild right foraminal narrowing without L3 nerve root encroachment. The spinal canal and left foramen are patent.   L4-5: Preserved disc height and hydration. Mild facet hypertrophy. No spinal stenosis or significant foraminal narrowing.   L5-S1: Mild loss of disc height. There is a new right-sided disc extrusion with caudal migration of a disc fragment into the right S1 lateral recess. There is mass effect on the right S1 nerve root. Mild facet and  ligamentous hypertrophy without resulting foraminal narrowing or L5 nerve root encroachment. The spinal canal and left foramen are patent.   IMPRESSION: 1. New right-sided disc extrusion at L5-S1 with caudal migration of a disc fragment into the right S1 lateral recess and mass effect on the right S1 nerve root. 2. New small disc protrusion in the right subarticular zone at L3-4 with similar mild chronic right lateral recess and right foraminal narrowing. 3. Stable chronic mild lateral recess and foraminal narrowing bilaterally at L2-3. 4. No acute osseous findings or significant central stenosis.      COGNITION: Overall cognitive status: Within functional limits for tasks assessed     SENSATION: Not tested  MUSCLE LENGTH:  L HS and piriformis Min limitation R HS and piriformis Mod limitation  Sciatic nerve R very tense and immobile, (+) tension test   L LE significantly shorter than R, hx of scoliosis   POSTURE: rounded shoulders and forward head  PALPATION:  Lateral R thigh tender, R piriformis tender   LUMBAR ROM:   AROM eval  Flexion Mod limitation, increased pain   Extension WNL, REIS some centralization   Right lateral flexion WNL   Left lateral flexion WNL   Right rotation   Left rotation    (Blank rows = not tested)    LOWER EXTREMITY MMT:    MMT Right eval Left eval  Hip flexion 3+ 3+  Hip extension  Hip abduction 3 4  Hip adduction    Hip internal rotation    Hip external rotation 2+ 4-  Knee flexion    Knee extension    Ankle dorsiflexion    Ankle plantarflexion    Ankle inversion    Ankle eversion     (Blank rows = not tested)   TREATMENT DATE:   06/05/24  Core stab 20 min with pilates Prone press ups after above ITB stripping Standing on airex 10 # rows and shld ext 2 sets 10- cues for postural correction and core engaged Leg press 30# 3 sets 10 IONTO RT GT 1.1 cc 4 hour leave on patch #2   06/01/23 PROM/stretching RT  LE STW with stripping RT ITB- instructed in rolling at home with partner 6 inch lateral step up with opp leg ABD 6 inch step up with hip circles 10 x each way BIL 6 inch hip hiking 2 sets 10 BIL Supine bridge with ball squeeze 2 sets 10 Bridge with green tband clams Ppt with green tband marching 20 x alt 2 x IONTO RT GT 1.1 cc 4 hour leave on patch  05/08/24  BIKE L 4 DSTW to thor. Paraspinals. Seated thor stretching Feet on ball bridge,  obl and iso abdominals Resisted trunk rotation 15 x BIL Wt ball obl 10 x each Resisted AR press and rotation 10 x BIL each way 15# resited shld ext 2 sets 10 Leg Press hips 3 way 12 x 30# 3# OH walking marching 20 feet 2 x 3# dead lift into upright row 10 x Green tband BIL hip ext and abd 15 x - pain on RT in stance and with ext  05/03/24 Dyna disc pelvic ROM 4 way 15 x each Scap stab on dyna disc 10 x 4 ways green tband Feet on ball bridge, KTC and obl Resisted trunk rotation 15 x BIL Hold ppt with PTA tactile cuing with resisted hip flex and abd Supine wt ball core stab Mech lumb traction 15 min 65#  05/01/24 Nustep L 5 10# cable pulleys shld et and row 2 sets 10 Black tband trunk ext and flex 20 x Lat pull down 20# 2 sets 10 Core stab ex 15 min PROM LE and trunk with NT stretching on LLE- issued NT for home  04/26/24 Nustep L 5 Step up opp leg ext 10 x BIL 6 inch then laterally with opp leg ABD Green tband shld ext, row and horz abd 15 x Hip 3 way red tband Bird Dog HEP Traction 60# 12 min  04/24/24 Nustep L 4 Resisted trunk flex and ext 20 x each Seated row and lat pull down 20# 2 sets 10 Upright row into trunk ext 2 sets 10 15# Reisted Gait backwar 5 x then 3 x each side 30# ( min A laterally d/t ankle) STS wt ball chest press 10 x then OH 10 x Seated green tband marching Supine green tband clams 20 x Supine red tband SLR 10 x then SLR with abd 10 x Feet on ball bridge, KTC,obl and iso abdominals ADD ball  squeeze with bridge Standing hip flex,ext and abd 10 x BIL red tband PROM and stretching BIL LE and trunk   04/17/24 Checked Rt ankle, lateral swelling over lateral malleolus with slight discoloration , good ROM and MMT,tender laterally as expected Sitting on dyna disc 4 way pelvic ROM 15 x each Sitting on dyna disc hip flex and hip abd Red tband shld  ext,row and ER on dyna disc 10 x each Core stab ex- feet on ball bridge,KTC,obl and iso abdominals Add ball squeeze,clams DL and SL with green tband , marching green tband SLR 10 x BIL SLR with abd 10 x BIL Mech lumb traction 15 min static 65#   04/13/24 PROM and stretching LEs and trunk NT stretching RT LE Core stab ex- feet on ball bridge,KTC,obl and iso abdominals Add ball squeeze,clams DL and SL with red tband , marching red tband SLR 10 x BIL SLR with abd 10 x BIL Mech lumb traction 15 min static 60# Discussed REIS to relief symptoms   04/03/24  Eval, HEP, POC                                                                                                                                   PATIENT EDUCATION:  Education details: exam findings, anatomy of region and how extension directional preference works/how movement affects her pain, POC, HEP  Person educated: Patient Education method: Explanation, Demonstration, and Handouts Education comprehension: verbalized understanding, returned demonstration, and needs further education  HOME EXERCISE PROGRAM: Access Code: [email or web address] URL: [email or web address] Date: 04/26/2024 Prepared by: Kery Batzel  Exercises - Shoulder extension with resistance - Neutral  - 1 x daily - 7 x weekly - 1 sets - 15 reps - Standing Shoulder Row with Anchored Resistance  - 1 x daily - 7 x weekly - 1 sets - 15 reps - Seated Shoulder Horizontal Abduction with Resistance - Palms Down  - 1 x daily - 7 x weekly - 1 sets - 15 reps - Bird Dog  - 1 x daily - 7 x weekly - 2 sets - 10 reps  - 3 hold - Forward Step Up  - 1 x daily - 7 x weekly - 1 sets - 10 reps - Lateral Step Up  - 1 x daily - 7 x weekly - 1 sets - 10 reps - Standing Hip Flexion with Resistance Loop  - 1 x daily - 7 x weekly - 2 sets - 10 reps - 3 hold - Standing Hip Extension and Flexion with Resistance  - 1 x daily - 7 x weekly - 2 sets - 10 reps - 3 hold - Hip Abduction with Resistance Loop  - 1 x daily - 7 x weekly - 2 sets - 10 reps - 3 hold    Access Code: [email or web address] URL: [email or web address] Date: 04/03/2024 Prepared by: Josette Rough  Exercises - Standing Lumbar Extension at Wall - Forearms  - 2-3 x daily - 7 x weekly - 1 sets - 10 reps - Hooklying Hamstring Stretch with Strap  - 2-3 x daily - 7 x weekly - 1 sets - 3 reps - 30 seconds  hold - Supine Transversus Abdominis Bracing - Hands on Ground  - 5 x daily - 7 x weekly - 1 sets - 10 reps - 5 seconds  hold  ASSESSMENT:  CLINICAL IMPRESSION: pt arrived with hip feeling some better ,with MT still very tender so do ITB stripping and ionto #2. Progressed core stab with verb and tactile cuing to stab.    OBJECTIVE IMPAIRMENTS: decreased mobility, decreased ROM, decreased strength, hypomobility, increased fascial restrictions, increased muscle spasms, impaired flexibility, impaired sensation, improper body mechanics, postural dysfunction, and pain.   ACTIVITY LIMITATIONS: carrying, lifting, bending, and locomotion level  PARTICIPATION LIMITATIONS: meal prep, cleaning, laundry, shopping, community activity, occupation, and yard work  PERSONAL FACTORS: Age, Fitness, Past/current experiences, Social background, and Time since onset of injury/illness/exacerbation are also affecting patient's functional outcome.   REHAB POTENTIAL: Good  CLINICAL DECISION MAKING: Stable/uncomplicated  EVALUATION COMPLEXITY: Low   GOALS: Goals reviewed with patient? No  SHORT TERM GOALS: Target date: 06/14/2024      Will be compliant with  appropriate progressive HEP GOAL STATUS: progressing 04/13/24  5/27/25MET   2. Will demonstrate improved postural awareness with all functional tasks, use of ergonomic aides PRN/as desired GOAL STATUS: progressing 04/13/24   MET 04/24/24   3. Will demonstrate good functional biomechanics for bed mobility and floor to waist lifting mechanics   MET 04/24/24   LONG TERM GOALS: Target date: 06/28/2024     MMT to have improved by one grade all weak groups GOAL STATUS: 05/03/24 progressing   05/31/24 progressing HIP ABD weakness  2. Mm flexibility and spasms to have improved by at least 50% in order to improve functional movement patterns and for pain control GOAL STATUS: 05/03/24 progressing   05/31/24  MET  3. Pain to be no more than 2/10 with all functional activities GOAL STATUS: progressing 05/08/24   05/31/24 progressing   4. Radicular symptoms to have resolved by at least 75%    05/03/24 progressing and 05/08/24  and 05/31/24      .  PLAN:  PT FREQUENCY: 2x/week  PT DURATION: 4 weeks  PLANNED INTERVENTIONS: 97750- Physical Performance Testing, 97110-Therapeutic exercises, 97530- Therapeutic activity, [id number]- Neuromuscular re-education, 97535- Self Care, [id number]- Manual therapy, [id number]- Aquatic Therapy, [phone]- Traction (mechanical), [id number]- Ionotophoresis 4mg /ml Dexamethasone , [id number] (1-2 muscles), [id number] (3+ muscles)- Dry Needling, and Taping.  PLAN FOR NEXT SESSION: assess ionto and foam rolling Patient Details  Name: KIMYATTA LECY MRN: [id number] Date of Birth: Mar 21, 1966 Referring Provider:  Billy Knee, FNP  Encounter Date: 06/05/2024   MARSH SNIFF, PTA 06/05/2024, 11:47 AM  [facility] [address] [facility] [place], [place], [address] Phone: [phone]   Fax:  [phone]

## 2024-06-06 ENCOUNTER — Ambulatory Visit: Admitting: Internal Medicine

## 2024-06-08 ENCOUNTER — Ambulatory Visit: Admitting: Physical Therapy

## 2024-06-08 DIAGNOSIS — M5417 Radiculopathy, lumbosacral region: Secondary | ICD-10-CM

## 2024-06-08 DIAGNOSIS — M6281 Muscle weakness (generalized): Secondary | ICD-10-CM

## 2024-06-08 NOTE — Therapy (Signed)
 OUTPATIENT PHYSICAL THERAPY THORACOLUMBAR    Patient Name: Melissa Cohen MRN: [id number] DOB:[birth year], 58 y.o., female Today's Date: 06/08/2024  END OF SESSION:  PT End of Session - 06/08/24 1101     Visit Number 11    Number of Visits 13    Date for PT Re-Evaluation 07/01/24    Authorization Type [employer]    Authorization Time Period 04/03/24 to 05/15/24    PT Start Time 1100    PT Stop Time 1145    PT Time Calculation (min) 45 min          Past Medical History:  Diagnosis Date   Allergy    Anemia    Anxiety    Arthritis    Osteo arthritis - bil knees   Asthma    as a young adult related to allergy flare   Atypical nevi 08/25/2018   1. LEFT MID BACK (MILD) - NO TX, 2. RIGHT CHEST (SEVERE) - W/S   Atypical nevi 09/14/2018   MID BACK (SEVERE) - W/S   Atypical nevi 11/13/2018   LEFT POST. NECK INCIDENTRAL MOLE (MILD)   Atypical nevi 11/27/2018   RIGHT CHEST + MARGIN   Atypical nevus 01/02/2018   LEFT UPPER BACK (SEVERE) - W/S   Atypical nevus 01/04/2019   LEFT STERNUM ( MODERATE)   Atypical nevus 01/04/2019   LEFT POST NECK INF. (SEVERE)   Atypical nevus 01/04/2019   RECURRENT MID BACK - [facility] DR. PAVLIS   Back pain    Basal cell carcinoma 06/06/2008   LEFT UPPER SHOULDER PROX. @ [facility]   Blood transfusion without reported diagnosis    from [facility] several   Chest pain    Depression    Fatty liver    Gallbladder problem    Gastric ulcer    GERD (gastroesophageal reflux disease)    Heart murmur    Hyperlipidemia    Hypertension    Iron deficiency anemia    Joint pain    Knee pain    Lower extremity edema    Melanoma (HCC)    right cleavage, posterior left shoulder   MM (malignant melanoma of skin) (HCC) 09/14/2018   LEFT POST. 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 Was then started back on Remicade  and is doing well since.   Vitamin D  deficiency    Past Surgical History:  Procedure Laterality Date   APPENDECTOMY     BREAST ENHANCEMENT SURGERY  2004   CHOLECYSTECTOMY  2011   COLONOSCOPY  2019   LAPAROTOMY N/A 06/17/2023   Procedure: EXPLORATORY LAPAROTOMY, REPAIR OF COLOTOMY;  Surgeon: Debby Hila, MD;  Location: [facility];  Service: General;  Laterality: N/A;   right eye lasix     02/17/12   TONSILLECTOMY AND ADENOIDECTOMY     TUBAL LIGATION     UPPER GASTROINTESTINAL ENDOSCOPY     Uterine ablation     WISDOM TOOTH EXTRACTION  Patient Active Problem List   Diagnosis Date Noted   Lumbar disc herniation with radiculopathy 04/04/2024   Seizures (HCC)    Restless leg syndrome    Hyperlipidemia    Sleep apnea    Perforation of colon as colonoscopy complication (HCC) 06/17/2023   Low back pain 05/13/2021   Adhesive capsulitis of left shoulder 04/01/2021   Metabolic syndrome 02/08/2021   At risk for impaired metabolic function 12/09/2020   Polyphagia 11/04/2020   Mood disorder, with emotional eating 11/04/2020   At risk for activity intolerance 11/04/2020   Vasovagal near syncope 03/26/2020   Frequent falls 02/13/2020   HTN (hypertension) 02/13/2020   Systolic murmur 03/26/2017   Chronic rhinitis 03/17/2016   Menopausal syndrome (hot flushes) 08/23/2014   Screening for malignant neoplasm of cervix 02/25/2014   Anxiety and depression 10/08/2011   Disorder of sacrum 04/12/2011   TROCHANTERIC BURSITIS, BILATERAL 04/12/2011   Benign neoplasm of skin 07/14/2010   Cervical radiculopathy 07/14/2010   GERD 12/31/2009   CARCINOMA, BASAL CELL 03/14/2008   Vitamin D  deficiency 03/06/2008   HYPERTRIGLYCERIDEMIA  03/06/2008   Ulcerative colitis (HCC) 02/09/2007   INSOMNIA 02/09/2007    PCP: Billy Knee FNP   REFERRING PROVIDER: Scharlene Sheena Charleston, MD  REFERRING DIAG:  Diagnosis  M51.16 (ICD-10-CM) - Intervertebral disc disorders with radiculopathy, lumbar region    Rationale for Evaluation and Treatment: Rehabilitation  THERAPY DIAG:  Muscle weakness (generalized)  Radiculopathy, lumbosacral region  ONSET DATE: week before last (late April 2025)  SUBJECTIVE:                                                                                                                                                                                           SUBJECTIVE STATEMENT:   Woke up with left LB pain but once up moving pain is gone   PERTINENT HISTORY:  See above   PAIN:  Are you having pain? RT LBP 1-2/10   PRECAUTIONS: None  RED FLAGS: None   WEIGHT BEARING RESTRICTIONS: No  FALLS:  Has patient fallen in last 6 months? Yes. Number of falls 1- fell over some plants she pulled inside to protect from frost, no FOF   LIVING ENVIRONMENT: Lives with: lives with their partner Lives in: House/apartment   OCCUPATION: travel agent- lots of sitting, lots of computer work, no ergonomic set up   PLOF: Independent, Independent with basic ADLs, Independent with gait, and Independent with transfers  PATIENT GOALS: feel better and quit hurting, strengthen   NEXT MD VISIT: Scharlene July 8th  OBJECTIVE:  Note: Objective measures were completed at Evaluation unless otherwise noted.  DIAGNOSTIC FINDINGS:  CLINICAL DATA:  Lumbar radiculopathy. History of degenerative disc disease. Right-sided low back pain with right leg pain and foot coldness 4 days. No known injury or prior relevant surgery.   EXAM: MRI LUMBAR SPINE WITHOUT CONTRAST   TECHNIQUE: Multiplanar, multisequence MR imaging of the lumbar spine was performed. No intravenous contrast was administered.    COMPARISON:  Lumbar spine radiographs 03/24/2024. Abdominopelvic CTA 06/16/2023. Lumbar MRI 04/02/2021.   FINDINGS: Segmentation: Conventional anatomy assumed, with the last open disc space designated L5-S1.Concordant with prior imaging.   Alignment: Stable mild convex left scoliosis and mild chronic retrolisthesis at L2-3.   Vertebrae: No worrisome osseous lesion, acute fracture or pars defect. The visualized sacroiliac joints appear unremarkable.   Conus medullaris: Extends to the L1 level. The conus and cauda equina appear normal.   Paraspinal and other soft tissues: No significant paraspinal findings.   Disc levels:   Sagittal images demonstrate no significant disc space findings within the visualized lower thoracic spine.   L1-2: Stable mild loss of disc height with a chronic small left paracentral disc extrusion. Minimal mass effect on the thecal sac without foraminal narrowing or nerve root encroachment.   L2-3: Chronic loss of disc height with annular disc bulging and endplate osteophytes. Mild bilateral facet hypertrophy. Chronic mild lateral recess and foraminal narrowing bilaterally without nerve root encroachment. The spinal canal is patent.   L3-4: Chronic loss of disc height with annular disc bulging and endplate osteophytes asymmetric to the right. New small disc protrusion in the right subarticular zone with similar mild chronic right lateral recess narrowing. Unchanged mild right foraminal narrowing without L3 nerve root encroachment. The spinal canal and left foramen are patent.   L4-5: Preserved disc height and hydration. Mild facet hypertrophy. No spinal stenosis or significant foraminal narrowing.   L5-S1: Mild loss of disc height. There is a new right-sided disc extrusion with caudal migration of a disc fragment into the right S1 lateral recess. There is mass effect on the right S1 nerve root. Mild facet and ligamentous hypertrophy without  resulting foraminal narrowing or L5 nerve root encroachment. The spinal canal and left foramen are patent.   IMPRESSION: 1. New right-sided disc extrusion at L5-S1 with caudal migration of a disc fragment into the right S1 lateral recess and mass effect on the right S1 nerve root. 2. New small disc protrusion in the right subarticular zone at L3-4 with similar mild chronic right lateral recess and right foraminal narrowing. 3. Stable chronic mild lateral recess and foraminal narrowing bilaterally at L2-3. 4. No acute osseous findings or significant central stenosis.      COGNITION: Overall cognitive status: Within functional limits for tasks assessed     SENSATION: Not tested  MUSCLE LENGTH:  L HS and piriformis Min limitation R HS and piriformis Mod limitation  Sciatic nerve R very tense and immobile, (+) tension test   L LE significantly shorter than R, hx of scoliosis   POSTURE: rounded shoulders and forward head  PALPATION:  Lateral R thigh tender, R piriformis tender   LUMBAR ROM:   AROM eval  Flexion Mod limitation, increased pain   Extension WNL, REIS some centralization   Right lateral flexion WNL   Left lateral flexion WNL   Right rotation   Left rotation    (Blank rows = not tested)    LOWER EXTREMITY MMT:    MMT Right eval Left eval  Hip flexion 3+ 3+  Hip extension    Hip abduction 3 4  Hip adduction    Hip internal rotation    Hip external rotation 2+ 4-  Knee flexion    Knee extension    Ankle dorsiflexion    Ankle plantarflexion    Ankle inversion    Ankle eversion     (Blank rows = not tested)   TREATMENT DATE:   06/08/24 Nustep L 5 Upright row into trunk ext 2 sets 10 20# On airex 10# row/squat 2 sets 10 On airex shld ext 2 sets 10 BIL hip cable pulley 4 ways 12 x each 10# Leg Press 40 # 3 sets 10 feet 3 way STS blue ball chest press 10 x, OH 10 x PROM BIL LE And trunk STM to R ITB Ionto #3 RT GT Shoe lift for  RT shoe as she is 1/4 inch off   06/05/24  Core stab 20 min with pilates Prone press ups after above ITB stripping Standing on airex 10 # rows and shld ext 2 sets 10- cues for postural correction and core engaged Leg press 30# 3 sets 10 IONTO RT GT 1.1 cc 4 hour leave on patch #2   06/01/23 PROM/stretching RT LE STW with stripping RT ITB- instructed in rolling at home with partner 6 inch lateral step up with opp leg ABD 6 inch step up with hip circles 10 x each way BIL 6 inch hip hiking 2 sets 10 BIL Supine bridge with ball squeeze 2 sets 10 Bridge with green tband clams Ppt with green tband marching 20 x alt 2 x IONTO RT GT 1.1 cc 4 hour leave on patch  05/08/24  BIKE L 4 DSTW to thor. Paraspinals. Seated thor stretching Feet on ball bridge,  obl and iso abdominals Resisted trunk rotation 15 x BIL Wt ball obl 10 x each Resisted AR press and rotation 10 x BIL each way 15# resited shld ext 2 sets 10 Leg Press hips 3 way 12 x 30# 3# OH walking marching 20 feet 2 x 3# dead lift into upright row 10 x Green tband BIL hip ext and abd 15 x - pain on RT in stance and with ext  05/03/24 Dyna disc pelvic ROM 4 way 15 x each Scap stab on dyna disc 10 x 4 ways green tband Feet on ball bridge, KTC and obl Resisted trunk rotation 15 x BIL Hold ppt with PTA tactile cuing with resisted hip flex and abd Supine wt ball core stab Mech lumb traction 15 min 65#  05/01/24 Nustep L 5 10# cable pulleys shld et and row 2 sets 10 Black tband trunk ext and flex 20 x Lat pull down 20# 2 sets 10 Core stab ex 15 min PROM LE and trunk with NT stretching on LLE- issued NT for home  04/26/24 Nustep L 5 Step up opp leg ext 10 x BIL 6 inch then laterally with opp leg ABD Green tband shld ext, row and horz abd 15 x Hip 3 way red tband Bird Dog HEP Traction 60# 12 min  04/24/24 Nustep L 4 Resisted trunk flex and ext 20 x each Seated row and lat pull down 20# 2 sets 10 Upright  row into trunk ext 2 sets 10 15# Reisted Gait backwar 5 x then 3 x each side 30# ( min A laterally d/t ankle) STS wt ball chest press 10 x then OH 10 x Seated green tband marching Supine green tband clams 20 x Supine red tband SLR 10 x then SLR  with abd 10 x Feet on ball bridge, KTC,obl and iso abdominals ADD ball squeeze with bridge Standing hip flex,ext and abd 10 x BIL red tband PROM and stretching BIL LE and trunk   04/17/24 Checked Rt ankle, lateral swelling over lateral malleolus with slight discoloration , good ROM and MMT,tender laterally as expected Sitting on dyna disc 4 way pelvic ROM 15 x each Sitting on dyna disc hip flex and hip abd Red tband shld ext,row and ER on dyna disc 10 x each Core stab ex- feet on ball bridge,KTC,obl and iso abdominals Add ball squeeze,clams DL and SL with green tband , marching green tband SLR 10 x BIL SLR with abd 10 x BIL Mech lumb traction 15 min static 65#   04/13/24 PROM and stretching LEs and trunk NT stretching RT LE Core stab ex- feet on ball bridge,KTC,obl and iso abdominals Add ball squeeze,clams DL and SL with red tband , marching red tband SLR 10 x BIL SLR with abd 10 x BIL Mech lumb traction 15 min static 60# Discussed REIS to relief symptoms   04/03/24  Eval, HEP, POC                                                                                                                                   PATIENT EDUCATION:  Education details: exam findings, anatomy of region and how extension directional preference works/how movement affects her pain, POC, HEP  Person educated: Patient Education method: Explanation, Demonstration, and Handouts Education comprehension: verbalized understanding, returned demonstration, and needs further education  HOME EXERCISE PROGRAM: Access Code: [email or web address] URL: [email or web address] Date: 04/26/2024 Prepared by: Zakaria Fromer  Exercises - Shoulder extension with  resistance - Neutral  - 1 x daily - 7 x weekly - 1 sets - 15 reps - Standing Shoulder Row with Anchored Resistance  - 1 x daily - 7 x weekly - 1 sets - 15 reps - Seated Shoulder Horizontal Abduction with Resistance - Palms Down  - 1 x daily - 7 x weekly - 1 sets - 15 reps - Bird Dog  - 1 x daily - 7 x weekly - 2 sets - 10 reps - 3 hold - Forward Step Up  - 1 x daily - 7 x weekly - 1 sets - 10 reps - Lateral Step Up  - 1 x daily - 7 x weekly - 1 sets - 10 reps - Standing Hip Flexion with Resistance Loop  - 1 x daily - 7 x weekly - 2 sets - 10 reps - 3 hold - Standing Hip Extension and Flexion with Resistance  - 1 x daily - 7 x weekly - 2 sets - 10 reps - 3 hold - Hip Abduction with Resistance Loop  - 1 x daily - 7 x weekly - 2 sets - 10 reps - 3 hold    Access Code: [email or web address] URL: [email or web address] Date: 04/03/2024 Prepared by: Josette Rough  Exercises - Standing Lumbar Extension at Wall - Forearms  - 2-3 x daily - 7 x weekly - 1 sets - 10 reps - Hooklying Hamstring Stretch with Strap  - 2-3 x daily - 7 x weekly - 1 sets - 3 reps - 30 seconds  hold - Supine Transversus Abdominis Bracing - Hands on Ground  - 5 x daily - 7 x weekly - 1 sets - 10 reps - 5 seconds  hold  ASSESSMENT:  CLINICAL IMPRESSION: pt arrived with no pain so we focused on core strength progression with cuing. Goals assessed. Tolerated ex well and able to engage core better. Less tightness in LEs and RT IT. Shoe lift trial for RT shoe as leg is about 1/4 in shorter    OBJECTIVE IMPAIRMENTS: decreased mobility, decreased ROM, decreased strength, hypomobility, increased fascial restrictions, increased muscle spasms, impaired flexibility, impaired sensation, improper body mechanics, postural dysfunction, and pain.   ACTIVITY LIMITATIONS: carrying, lifting, bending, and locomotion level  PARTICIPATION LIMITATIONS: meal prep, cleaning, laundry, shopping, community activity, occupation, and yard  work  PERSONAL FACTORS: Age, Fitness, Past/current experiences, Social background, and Time since onset of injury/illness/exacerbation are also affecting patient's functional outcome.   REHAB POTENTIAL: Good  CLINICAL DECISION MAKING: Stable/uncomplicated  EVALUATION COMPLEXITY: Low   GOALS: Goals reviewed with patient? No  SHORT TERM GOALS: Target date: 06/14/2024      Will be compliant with appropriate progressive HEP GOAL STATUS: progressing 04/13/24  5/27/25MET   2. Will demonstrate improved postural awareness with all functional tasks, use of ergonomic aides PRN/as desired GOAL STATUS: progressing 04/13/24   MET 04/24/24   3. Will demonstrate good functional biomechanics for bed mobility and floor to waist lifting mechanics   MET 04/24/24   LONG TERM GOALS: Target date: 06/28/2024     MMT to have improved by one grade all weak groups GOAL STATUS: 05/03/24 progressing   05/31/24 progressing HIP ABD weakness  06/08/24 progressing  2. Mm flexibility and spasms to have improved by at least 50% in order to improve functional movement patterns and for pain control GOAL STATUS: 05/03/24 progressing   05/31/24  MET  3. Pain to be no more than 2/10 with all functional activities GOAL STATUS: progressing 05/08/24   05/31/24 progressing   06/08/24 progressing actvity  4. Radicular symptoms to have resolved by at least 75%    05/03/24 progressing and 05/08/24  and 05/31/24 50% better 06/08/24      .  PLAN:  PT FREQUENCY: 2x/week  PT DURATION: 4 weeks  PLANNED INTERVENTIONS: 97750- Physical Performance Testing, 97110-Therapeutic exercises, 97530- Therapeutic activity, [id number]- Neuromuscular re-education, 97535- Self Care, [id number]- Manual therapy, [id number]- Aquatic Therapy, [id number]- Traction (mechanical), [id number]- Ionotophoresis 4mg /ml Dexamethasone , 79439 (1-2 muscles), 20561 (3+ muscles)- Dry Needling, and Taping.  PLAN FOR NEXT SESSION: assess ionto and foam rolling Patient Details  Name: Melissa Cohen MRN: [id number] Date of Birth: [birth year] Referring Provider:  Billy Knee, FNP  Encounter Date: 06/08/2024   Melissa Cohen, PTA 06/08/2024, 11:03 AM  [facility] [address] [facility] [place], [place], [address] Phone: [phone]   Fax:  [phone]

## 2024-06-11 ENCOUNTER — Other Ambulatory Visit: Payer: Self-pay | Admitting: Pediatrics

## 2024-06-12 ENCOUNTER — Other Ambulatory Visit (HOSPITAL_COMMUNITY): Payer: Self-pay

## 2024-06-12 ENCOUNTER — Ambulatory Visit: Admitting: Physical Therapy

## 2024-06-12 DIAGNOSIS — M79604 Pain in right leg: Secondary | ICD-10-CM

## 2024-06-12 DIAGNOSIS — M5417 Radiculopathy, lumbosacral region: Secondary | ICD-10-CM

## 2024-06-12 DIAGNOSIS — M6281 Muscle weakness (generalized): Secondary | ICD-10-CM

## 2024-06-12 NOTE — Therapy (Signed)
 OUTPATIENT PHYSICAL THERAPY THORACOLUMBAR    Patient Name: Melissa Cohen MRN: [id number] DOB:[birth year], 58 y.o., female Today's Date: 06/12/2024  END OF SESSION:  PT End of Session - 06/12/24 1402     Visit Number 12    Date for PT Re-Evaluation 07/01/24    Authorization Type [employer]    PT Start Time 1400    PT Stop Time 1445    PT Time Calculation (min) 45 min          Past Medical History:  Diagnosis Date   Allergy    Anemia    Anxiety    Arthritis    Osteo arthritis - bil knees   Asthma    as a young adult related to allergy flare   Atypical nevi 08/25/2018   1. LEFT MID BACK (MILD) - NO TX, 2. RIGHT CHEST (SEVERE) - W/S   Atypical nevi 09/14/2018   MID BACK (SEVERE) - W/S   Atypical nevi 11/13/2018   LEFT POST. NECK INCIDENTRAL MOLE (MILD)   Atypical nevi 11/27/2018   RIGHT CHEST + MARGIN   Atypical nevus 01/02/2018   LEFT UPPER BACK (SEVERE) - W/S   Atypical nevus 01/04/2019   LEFT STERNUM ( MODERATE)   Atypical nevus 01/04/2019   LEFT POST NECK INF. (SEVERE)   Atypical nevus 01/04/2019   RECURRENT MID BACK - [facility] DR. PAVLIS   Back pain    Basal cell carcinoma 06/06/2008   LEFT UPPER SHOULDER PROX. @ [facility]   Blood transfusion without reported diagnosis    from [facility] several   Chest pain    Depression    Fatty liver    Gallbladder problem    Gastric ulcer    GERD (gastroesophageal reflux disease)    Heart murmur    Hyperlipidemia    Hypertension    Iron deficiency anemia    Joint pain    Knee pain    Lower extremity edema    Melanoma (HCC)    right cleavage, posterior left shoulder   MM (malignant melanoma of skin) (HCC) 09/14/2018   LEFT POST. NECK MIS - EXC   MM (malignant melanoma of skin) (HCC) 09/14/2018   RIGHT CHEST MIS - EXC   Obesity    Restless leg syndrome    Seizures (HCC)    2-3 d/t hypogylcermia - last seizure was 1998   Shortness of breath on exertion    Sleep apnea    sleep study showed mild sleep  apnea - no c-papa needed   Ulcerative colitis    Dx at age 44; had been 30 years since her last flareup and then had a flareup recently in summer of 2017. Was then started back on Remicade  and is doing well since.   Vitamin D  deficiency    Past Surgical History:  Procedure Laterality Date   APPENDECTOMY     BREAST ENHANCEMENT SURGERY  2004   CHOLECYSTECTOMY  2011   COLONOSCOPY  2019   LAPAROTOMY N/A 06/17/2023   Procedure: EXPLORATORY LAPAROTOMY, REPAIR OF COLOTOMY;  Surgeon: Debby Hila, MD;  Location: [facility];  Service: General;  Laterality: N/A;   right eye lasix     02/17/12   TONSILLECTOMY AND ADENOIDECTOMY     TUBAL LIGATION     UPPER GASTROINTESTINAL ENDOSCOPY     Uterine ablation     WISDOM TOOTH EXTRACTION     Patient Active Problem List   Diagnosis Date Noted   Lumbar disc herniation with  radiculopathy 04/04/2024   Seizures (HCC)    Restless leg syndrome    Hyperlipidemia    Sleep apnea    Perforation of colon as colonoscopy complication (HCC) 06/17/2023   Low back pain 05/13/2021   Adhesive capsulitis of left shoulder 04/01/2021   Metabolic syndrome 02/08/2021   At risk for impaired metabolic function 12/09/2020   Polyphagia 11/04/2020   Mood disorder, with emotional eating 11/04/2020   At risk for activity intolerance 11/04/2020   Vasovagal near syncope 03/26/2020   Frequent falls 02/13/2020   HTN (hypertension) 02/13/2020   Systolic murmur 03/26/2017   Chronic rhinitis 03/17/2016   Menopausal syndrome (hot flushes) 08/23/2014   Screening for malignant neoplasm of cervix 02/25/2014   Anxiety and depression 10/08/2011   Disorder of sacrum 04/12/2011   TROCHANTERIC BURSITIS, BILATERAL 04/12/2011   Benign neoplasm of skin 07/14/2010   Cervical radiculopathy 07/14/2010   GERD 12/31/2009   CARCINOMA, BASAL CELL 03/14/2008   Vitamin D  deficiency 03/06/2008   HYPERTRIGLYCERIDEMIA 03/06/2008   Ulcerative colitis (HCC) 02/09/2007   INSOMNIA 02/09/2007     PCP: Billy Knee FNP   REFERRING PROVIDER: Scharlene Sheena Charleston, MD  REFERRING DIAG:  Diagnosis  M51.16 (ICD-10-CM) - Intervertebral disc disorders with radiculopathy, lumbar region    Rationale for Evaluation and Treatment: Rehabilitation  THERAPY DIAG:  Muscle weakness (generalized)  Radiculopathy, lumbosacral region  Pain in right leg  ONSET DATE: week before last (late April 2025)  SUBJECTIVE:                                                                                                                                                                                           SUBJECTIVE STATEMENT:   Overall feeling much better. Lift is helping a lot. Thoracic feels like it needs to pop   PERTINENT HISTORY:  See above   PAIN:  Are you having pain? RT LBP 1-2/10   PRECAUTIONS: None  RED FLAGS: None   WEIGHT BEARING RESTRICTIONS: No  FALLS:  Has patient fallen in last 6 months? Yes. Number of falls 1- fell over some plants she pulled inside to protect from frost, no FOF   LIVING ENVIRONMENT: Lives with: lives with their partner Lives in: House/apartment   OCCUPATION: travel agent- lots of sitting, lots of computer work, no ergonomic set up   PLOF: Independent, Independent with basic ADLs, Independent with gait, and Independent with transfers  PATIENT GOALS: feel better and quit hurting, strengthen   NEXT MD VISIT: Scharlene July 8th  OBJECTIVE:  Note: Objective measures were completed at Evaluation unless otherwise noted.  DIAGNOSTIC FINDINGS:  CLINICAL DATA:  Lumbar radiculopathy. History  of degenerative disc disease. Right-sided low back pain with right leg pain and foot coldness 4 days. No known injury or prior relevant surgery.   EXAM: MRI LUMBAR SPINE WITHOUT CONTRAST   TECHNIQUE: Multiplanar, multisequence MR imaging of the lumbar spine was performed. No intravenous contrast was administered.   COMPARISON:  Lumbar spine  radiographs 03/24/2024. Abdominopelvic CTA 06/16/2023. Lumbar MRI 04/02/2021.   FINDINGS: Segmentation: Conventional anatomy assumed, with the last open disc space designated L5-S1.Concordant with prior imaging.   Alignment: Stable mild convex left scoliosis and mild chronic retrolisthesis at L2-3.   Vertebrae: No worrisome osseous lesion, acute fracture or pars defect. The visualized sacroiliac joints appear unremarkable.   Conus medullaris: Extends to the L1 level. The conus and cauda equina appear normal.   Paraspinal and other soft tissues: No significant paraspinal findings.   Disc levels:   Sagittal images demonstrate no significant disc space findings within the visualized lower thoracic spine.   L1-2: Stable mild loss of disc height with a chronic small left paracentral disc extrusion. Minimal mass effect on the thecal sac without foraminal narrowing or nerve root encroachment.   L2-3: Chronic loss of disc height with annular disc bulging and endplate osteophytes. Mild bilateral facet hypertrophy. Chronic mild lateral recess and foraminal narrowing bilaterally without nerve root encroachment. The spinal canal is patent.   L3-4: Chronic loss of disc height with annular disc bulging and endplate osteophytes asymmetric to the right. New small disc protrusion in the right subarticular zone with similar mild chronic right lateral recess narrowing. Unchanged mild right foraminal narrowing without L3 nerve root encroachment. The spinal canal and left foramen are patent.   L4-5: Preserved disc height and hydration. Mild facet hypertrophy. No spinal stenosis or significant foraminal narrowing.   L5-S1: Mild loss of disc height. There is a new right-sided disc extrusion with caudal migration of a disc fragment into the right S1 lateral recess. There is mass effect on the right S1 nerve root. Mild facet and ligamentous hypertrophy without resulting foraminal narrowing or  L5 nerve root encroachment. The spinal canal and left foramen are patent.   IMPRESSION: 1. New right-sided disc extrusion at L5-S1 with caudal migration of a disc fragment into the right S1 lateral recess and mass effect on the right S1 nerve root. 2. New small disc protrusion in the right subarticular zone at L3-4 with similar mild chronic right lateral recess and right foraminal narrowing. 3. Stable chronic mild lateral recess and foraminal narrowing bilaterally at L2-3. 4. No acute osseous findings or significant central stenosis.      COGNITION: Overall cognitive status: Within functional limits for tasks assessed     SENSATION: Not tested  MUSCLE LENGTH:  L HS and piriformis Min limitation R HS and piriformis Mod limitation  Sciatic nerve R very tense and immobile, (+) tension test   L LE significantly shorter than R, hx of scoliosis   POSTURE: rounded shoulders and forward head  PALPATION:  Lateral R thigh tender, R piriformis tender   LUMBAR ROM:   AROM eval  Flexion Mod limitation, increased pain   Extension WNL, REIS some centralization   Right lateral flexion WNL   Left lateral flexion WNL   Right rotation   Left rotation    (Blank rows = not tested)    LOWER EXTREMITY MMT:    MMT Right eval Left eval  Hip flexion 3+ 3+  Hip extension    Hip abduction 3 4  Hip adduction  Hip internal rotation    Hip external rotation 2+ 4-  Knee flexion    Knee extension    Ankle dorsiflexion    Ankle plantarflexion    Ankle inversion    Ankle eversion     (Blank rows = not tested)   TREATMENT DATE:   06/12/24 Bike L 4 Thoracic stretching Black tband trunk ext 20 x Seated row 25# 2 sets 12 Lat Pull 25# 2 sets 12 Wt ball OH ext standing at wall Wt ball trunk rotation standing 10 x each Dead Lift 6# 2 sets 10 BIL hip cable pulley 4 ways 12 x each 10#- increased deep joint pain BIL  PROM BIL LE and trunk Educ in foam roller for spinal  stretching    06/08/24 Nustep L 5 Upright row into trunk ext 2 sets 10 20# On airex 10# row/squat 2 sets 10 On airex shld ext 2 sets 10 BIL hip cable pulley 4 ways 12 x each 10# Leg Press 40 # 3 sets 10 feet 3 way STS blue ball chest press 10 x, OH 10 x PROM BIL LE And trunk STM to R ITB Ionto #3 RT GT Shoe lift for RT shoe as she is 1/4 inch off   06/05/24  Core stab 20 min with pilates Prone press ups after above ITB stripping Standing on airex 10 # rows and shld ext 2 sets 10- cues for postural correction and core engaged Leg press 30# 3 sets 10 IONTO RT GT 1.1 cc 4 hour leave on patch #2   06/01/23 PROM/stretching RT LE STW with stripping RT ITB- instructed in rolling at home with partner 6 inch lateral step up with opp leg ABD 6 inch step up with hip circles 10 x each way BIL 6 inch hip hiking 2 sets 10 BIL Supine bridge with ball squeeze 2 sets 10 Bridge with green tband clams Ppt with green tband marching 20 x alt 2 x IONTO RT GT 1.1 cc 4 hour leave on patch  05/08/24  BIKE L 4 DSTW to thor. Paraspinals. Seated thor stretching Feet on ball bridge,  obl and iso abdominals Resisted trunk rotation 15 x BIL Wt ball obl 10 x each Resisted AR press and rotation 10 x BIL each way 15# resited shld ext 2 sets 10 Leg Press hips 3 way 12 x 30# 3# OH walking marching 20 feet 2 x 3# dead lift into upright row 10 x Green tband BIL hip ext and abd 15 x - pain on RT in stance and with ext  05/03/24 Dyna disc pelvic ROM 4 way 15 x each Scap stab on dyna disc 10 x 4 ways green tband Feet on ball bridge, KTC and obl Resisted trunk rotation 15 x BIL Hold ppt with PTA tactile cuing with resisted hip flex and abd Supine wt ball core stab Mech lumb traction 15 min 65#  05/01/24 Nustep L 5 10# cable pulleys shld et and row 2 sets 10 Black tband trunk ext and flex 20 x Lat pull down 20# 2 sets 10 Core stab ex 15 min PROM LE and trunk with NT stretching on LLE-  issued NT for home  04/26/24 Nustep L 5 Step up opp leg ext 10 x BIL 6 inch then laterally with opp leg ABD Green tband shld ext, row and horz abd 15 x Hip 3 way red tband Bird Dog HEP Traction 60# 12 min  04/24/24 Nustep L 4 Resisted  trunk flex and ext 20 x each Seated row and lat pull down 20# 2 sets 10 Upright row into trunk ext 2 sets 10 15# Reisted Gait backwar 5 x then 3 x each side 30# ( min A laterally d/t ankle) STS wt ball chest press 10 x then OH 10 x Seated green tband marching Supine green tband clams 20 x Supine red tband SLR 10 x then SLR with abd 10 x Feet on ball bridge, KTC,obl and iso abdominals ADD ball squeeze with bridge Standing hip flex,ext and abd 10 x BIL red tband PROM and stretching BIL LE and trunk   04/17/24 Checked Rt ankle, lateral swelling over lateral malleolus with slight discoloration , good ROM and MMT,tender laterally as expected Sitting on dyna disc 4 way pelvic ROM 15 x each Sitting on dyna disc hip flex and hip abd Red tband shld ext,row and ER on dyna disc 10 x each Core stab ex- feet on ball bridge,KTC,obl and iso abdominals Add ball squeeze,clams DL and SL with green tband , marching green tband SLR 10 x BIL SLR with abd 10 x BIL Mech lumb traction 15 min static 65#   04/13/24 PROM and stretching LEs and trunk NT stretching RT LE Core stab ex- feet on ball bridge,KTC,obl and iso abdominals Add ball squeeze,clams DL and SL with red tband , marching red tband SLR 10 x BIL SLR with abd 10 x BIL Mech lumb traction 15 min static 60# Discussed REIS to relief symptoms   04/03/24  Eval, HEP, POC                                                                                                                                   PATIENT EDUCATION:  Education details: exam findings, anatomy of region and how extension directional preference works/how movement affects her pain, POC, HEP  Person educated: Patient Education  method: Explanation, Demonstration, and Handouts Education comprehension: verbalized understanding, returned demonstration, and needs further education  HOME EXERCISE PROGRAM: Access Code: [email or web address] URL: [email or web address] Date: 04/26/2024 Prepared by: Jovonni Borquez  Exercises - Shoulder extension with resistance - Neutral  - 1 x daily - 7 x weekly - 1 sets - 15 reps - Standing Shoulder Row with Anchored Resistance  - 1 x daily - 7 x weekly - 1 sets - 15 reps - Seated Shoulder Horizontal Abduction with Resistance - Palms Down  - 1 x daily - 7 x weekly - 1 sets - 15 reps - Bird Dog  - 1 x daily - 7 x weekly - 2 sets - 10 reps - 3 hold - Forward Step Up  - 1 x daily - 7 x weekly - 1 sets - 10 reps - Lateral Step Up  - 1 x daily - 7 x weekly - 1 sets - 10 reps - Standing Hip Flexion with Resistance Loop  - 1 x daily - 7 x weekly -  2 sets - 10 reps - 3 hold - Standing Hip Extension and Flexion with Resistance  - 1 x daily - 7 x weekly - 2 sets - 10 reps - 3 hold - Hip Abduction with Resistance Loop  - 1 x daily - 7 x weekly - 2 sets - 10 reps - 3 hold    Access Code: [email or web address] URL: [email or web address] Date: 04/03/2024 Prepared by: Josette Rough  Exercises - Standing Lumbar Extension at Wall - Forearms  - 2-3 x daily - 7 x weekly - 1 sets - 10 reps - Hooklying Hamstring Stretch with Strap  - 2-3 x daily - 7 x weekly - 1 sets - 3 reps - 30 seconds  hold - Supine Transversus Abdominis Bracing - Hands on Ground  - 5 x daily - 7 x weekly - 1 sets - 10 reps - 5 seconds  hold  ASSESSMENT:  CLINICAL IMPRESSION: pt arrived with less pain and felt heel lift is helping. Pt with BIL hip pain with cable pulley hip ex and more tenderness with end range stretching- encouraged more and deeper stretching at home and educ on use of foam roller for spinal stretching.  OBJECTIVE IMPAIRMENTS: decreased mobility, decreased ROM, decreased strength, hypomobility, increased  fascial restrictions, increased muscle spasms, impaired flexibility, impaired sensation, improper body mechanics, postural dysfunction, and pain.   ACTIVITY LIMITATIONS: carrying, lifting, bending, and locomotion level  PARTICIPATION LIMITATIONS: meal prep, cleaning, laundry, shopping, community activity, occupation, and yard work  PERSONAL FACTORS: Age, Fitness, Past/current experiences, Social background, and Time since onset of injury/illness/exacerbation are also affecting patient's functional outcome.   REHAB POTENTIAL: Good  CLINICAL DECISION MAKING: Stable/uncomplicated  EVALUATION COMPLEXITY: Low   GOALS: Goals reviewed with patient? No  SHORT TERM GOALS: Target date: 06/14/2024      Will be compliant with appropriate progressive HEP GOAL STATUS: progressing 04/13/24  5/27/25MET   2. Will demonstrate improved postural awareness with all functional tasks, use of ergonomic aides PRN/as desired GOAL STATUS: progressing 04/13/24   MET 04/24/24   3. Will demonstrate good functional biomechanics for bed mobility and floor to waist lifting mechanics   MET 04/24/24   LONG TERM GOALS: Target date: 06/28/2024     MMT to have improved by one grade all weak groups GOAL STATUS: 05/03/24 progressing   05/31/24 progressing HIP ABD weakness  06/08/24 progressing  2. Mm flexibility and spasms to have improved by at least 50% in order to improve functional movement patterns and for pain control GOAL STATUS: 05/03/24 progressing   05/31/24  MET  3. Pain to be no more than 2/10 with all functional activities GOAL STATUS: progressing 05/08/24   05/31/24 progressing   06/08/24 progressing actvity  4. Radicular symptoms to have resolved by at least 75%    05/03/24 progressing and 05/08/24  and 05/31/24 50% better 06/08/24      .  PLAN:  PT FREQUENCY: 2x/week  PT DURATION: 4 weeks  PLANNED INTERVENTIONS: 97750- Physical Performance Testing, 97110-Therapeutic exercises, 97530- Therapeutic activity,  [id number]- Neuromuscular re-education, 97535- Self Care, 02859- Manual therapy, [id number]- Aquatic Therapy, [id number]- Traction (mechanical), [id number]- Ionotophoresis 4mg /ml Dexamethasone , 79439 (1-2 muscles), 20561 (3+ muscles)- Dry Needling, and Taping.  PLAN FOR NEXT SESSION: assess and progress. Check goals Patient Details  Name: Melissa Cohen MRN: [id number] Date of Birth: 01-19-1966 Referring Provider:  Billy Knee, FNP  Encounter Date: 06/12/2024   MARSH SNIFF, PTA 06/12/2024, 2:02 PM  [facility]  [address] [place], [place], [address] Phone: [phone]   Fax:  [phone]

## 2024-06-15 ENCOUNTER — Other Ambulatory Visit: Payer: Self-pay | Admitting: Pediatrics

## 2024-06-19 ENCOUNTER — Ambulatory Visit: Admitting: Physical Therapy

## 2024-06-22 ENCOUNTER — Ambulatory Visit: Admitting: Physical Therapy

## 2024-06-26 ENCOUNTER — Ambulatory Visit: Admitting: Physical Therapy

## 2024-06-27 ENCOUNTER — Ambulatory Visit: Admitting: Cardiology

## 2024-06-28 ENCOUNTER — Ambulatory Visit: Admitting: Physical Therapy

## 2024-06-28 ENCOUNTER — Encounter: Admitting: Physical Therapy

## 2024-07-24 DIAGNOSIS — M5416 Radiculopathy, lumbar region: Secondary | ICD-10-CM | POA: Diagnosis not present

## 2024-07-26 ENCOUNTER — Ambulatory Visit (INDEPENDENT_AMBULATORY_CARE_PROVIDER_SITE_OTHER)

## 2024-07-26 ENCOUNTER — Ambulatory Visit: Payer: Self-pay | Admitting: Nurse Practitioner

## 2024-07-26 ENCOUNTER — Ambulatory Visit
Admission: EM | Admit: 2024-07-26 | Discharge: 2024-07-26 | Disposition: A | Discharge status: Home / Self Care | Attending: Family Medicine | Admitting: Family Medicine

## 2024-07-26 DIAGNOSIS — Z8639 Personal history of other endocrine, nutritional and metabolic disease: Secondary | ICD-10-CM | POA: Diagnosis not present

## 2024-07-26 DIAGNOSIS — E663 Overweight: Secondary | ICD-10-CM | POA: Diagnosis not present

## 2024-07-26 DIAGNOSIS — T189XXA Foreign body of alimentary tract, part unspecified, initial encounter: Secondary | ICD-10-CM

## 2024-07-26 DIAGNOSIS — K760 Fatty (change of) liver, not elsewhere classified: Secondary | ICD-10-CM | POA: Diagnosis not present

## 2024-07-26 DIAGNOSIS — R632 Polyphagia: Secondary | ICD-10-CM | POA: Diagnosis not present

## 2024-07-26 DIAGNOSIS — Z03821 Encounter for observation for suspected ingested foreign body ruled out: Secondary | ICD-10-CM | POA: Diagnosis not present

## 2024-07-26 DIAGNOSIS — R7301 Impaired fasting glucose: Secondary | ICD-10-CM | POA: Diagnosis not present

## 2024-07-26 DIAGNOSIS — E782 Mixed hyperlipidemia: Secondary | ICD-10-CM | POA: Diagnosis not present

## 2024-07-26 DIAGNOSIS — R110 Nausea: Secondary | ICD-10-CM | POA: Diagnosis not present

## 2024-07-26 DIAGNOSIS — Z6827 Body mass index (BMI) 27.0-27.9, adult: Secondary | ICD-10-CM | POA: Diagnosis not present

## 2024-07-26 NOTE — ED Provider Notes (Signed)
 [facility]    CSN: [id number] Arrival date & time: 07/26/24  0801      History   Chief Complaint Chief Complaint  Patient presents with   Swallowed Foreign Body    HPI Melissa Cohen is a 58 y.o. female presents for possible swallowed foreign body.  Patient reports she has a 3 molar bridge.  she states when she went to bed she did not have the bridge in place is concerned she may have swallowed it while eating during dinner.  Denies any abdominal pain.  Had some nausea but took Zofran  and that resolved.  Did take a laxative last night prior to this and has had a bowel movement with no blood or foreign body noted.  No other concerns at this time.   Swallowed Foreign Body    Past Medical History:  Diagnosis Date   Allergy    Anemia    Anxiety    Arthritis    Osteo arthritis - bil knees   Asthma    as a young adult related to allergy flare   Atypical nevi 08/25/2018   1. LEFT MID BACK (MILD) - NO TX, 2. RIGHT CHEST (SEVERE) - W/S   Atypical nevi 09/14/2018   MID BACK (SEVERE) - W/S   Atypical nevi 11/13/2018   LEFT POST. NECK INCIDENTRAL MOLE (MILD)   Atypical nevi 11/27/2018   RIGHT CHEST + MARGIN   Atypical nevus 01/02/2018   LEFT UPPER BACK (SEVERE) - W/S   Atypical nevus 01/04/2019   LEFT STERNUM ( MODERATE)   Atypical nevus 01/04/2019   LEFT POST NECK INF. (SEVERE)   Atypical nevus 01/04/2019   RECURRENT MID BACK - [facility] DR. PAVLIS   Back pain    Basal cell carcinoma 06/06/2008   LEFT UPPER SHOULDER PROX. @ [facility]   Blood transfusion without reported diagnosis    from [facility] several   Chest pain    Depression    Fatty liver    Gallbladder problem    Gastric ulcer    GERD (gastroesophageal reflux disease)    Heart murmur    Hyperlipidemia    Hypertension    Iron deficiency anemia    Joint pain    Knee pain    Lower extremity edema    Melanoma (HCC)    right cleavage, posterior left shoulder   MM (malignant melanoma  of skin) (HCC) 09/14/2018   LEFT POST. NECK MIS - EXC   MM (malignant melanoma of skin) (HCC) 09/14/2018   RIGHT CHEST MIS - EXC   Obesity    Restless leg syndrome    Seizures (HCC)    2-3 d/t hypogylcermia - last seizure was 1998   Shortness of breath on exertion    Sleep apnea    sleep study showed mild sleep apnea - no c-papa needed   Ulcerative colitis    Dx at age 37; had been 30 years since her last flareup and then had a flareup recently in summer of 2017. Was then started back on Remicade  and is doing well since.   Vitamin D  deficiency     Patient Active Problem List   Diagnosis Date Noted   Lumbar disc herniation with radiculopathy 04/04/2024   Seizures (HCC)    Restless leg syndrome    Hyperlipidemia    Sleep apnea    Perforation of colon as colonoscopy complication (HCC) 06/17/2023   Low back pain 05/13/2021   Adhesive capsulitis of left  shoulder 04/01/2021   Metabolic syndrome 02/08/2021   At risk for impaired metabolic function 12/09/2020   Polyphagia 11/04/2020   Mood disorder, with emotional eating 11/04/2020   At risk for activity intolerance 11/04/2020   Vasovagal near syncope 03/26/2020   Frequent falls 02/13/2020   HTN (hypertension) 02/13/2020   Systolic murmur 03/26/2017   Chronic rhinitis 03/17/2016   Menopausal syndrome (hot flushes) 08/23/2014   Screening for malignant neoplasm of cervix 02/25/2014   Anxiety and depression 10/08/2011   Disorder of sacrum 04/12/2011   TROCHANTERIC BURSITIS, BILATERAL 04/12/2011   Benign neoplasm of skin 07/14/2010   Cervical radiculopathy 07/14/2010   GERD 12/31/2009   CARCINOMA, BASAL CELL 03/14/2008   Vitamin D  deficiency 03/06/2008   HYPERTRIGLYCERIDEMIA 03/06/2008   Ulcerative colitis (HCC) 02/09/2007   INSOMNIA 02/09/2007    Past Surgical History:  Procedure Laterality Date   APPENDECTOMY     BREAST ENHANCEMENT SURGERY  2004   CHOLECYSTECTOMY  2011   COLONOSCOPY  2019   LAPAROTOMY N/A 06/17/2023    Procedure: EXPLORATORY LAPAROTOMY, REPAIR OF COLOTOMY;  Surgeon: Debby Hila, MD;  Location: [facility];  Service: General;  Laterality: N/A;   right eye lasix     02/17/12   TONSILLECTOMY AND ADENOIDECTOMY     TUBAL LIGATION     UPPER GASTROINTESTINAL ENDOSCOPY     Uterine ablation     WISDOM TOOTH EXTRACTION      OB History     Gravida  3   Para      Term      Preterm      AB      Living  3      SAB      IAB      Ectopic      Multiple      Live Births               Home Medications    Prior to Admission medications   Medication Sig Start Date End Date Taking? Authorizing Provider  acetaminophen  (TYLENOL ) 500 MG tablet Take 2 tablets (1,000 mg total) by mouth every 8 (eight) hours as needed for mild pain (pain score 1-3) or moderate pain (pain score 4-6). 04/04/24  Yes Billy Knee, FNP  albuterol  (VENTOLIN  HFA) 108 (90 Base) MCG/ACT inhaler Inhale 2 puffs into the lungs every 6 (six) hours as needed for wheezing or shortness of breath. 01/03/23  Yes Tabori, Katherine E, MD  B Complex-C (SUPER B COMPLEX PO) Take 1 tablet by mouth every other day.   Yes [provider]  Biotin 89999 MCG TABS Take 10,000 mcg by mouth daily.   Yes [provider]  buPROPion  (WELLBUTRIN  XL) 300 MG 24 hr tablet Take 1 tablet (300 mg total) by mouth daily. 03/21/24  Yes Billy Knee, FNP  celecoxib  (CELEBREX ) 200 MG capsule Take 1 capsule (200 mg total) by mouth daily. 03/21/24  Yes Billy Knee, FNP  Cholecalciferol (VITAMIN D3) 125 MCG (5000 UT) CAPS Take 1 capsule by mouth daily.   Yes [provider]  cyclobenzaprine  (FLEXERIL ) 5 MG tablet Take 1 tablet (5 mg total) by mouth at bedtime as needed. 03/24/24  Yes Christopher Savannah, PA-C  esomeprazole  (NEXIUM ) 40 MG capsule TAKE 1 CAPSULE DAILY 06/15/24  Yes McGreal, Inocente HERO, MD  famotidine  (PEPCID ) 40 MG tablet TAKE ONE TABLET BY MOUTH ONE TIME DAILY Patient taking differently: Take 40 mg by mouth daily as needed for  heartburn or indigestion. 02/25/22  Yes Aneita Gist  T, MD  gabapentin  (NEURONTIN ) 300 MG capsule Take 1 capsule (300 mg total) by mouth 3 (three) times daily. 04/17/24  Yes Billy Knee, FNP  ondansetron  (ZOFRAN ) 4 MG tablet Take 1 tablet (4 mg) by mouth as directed. 06/21/23  Yes Vicci Sor R, PA-C  QULIPTA 60 MG TABS daily. 03/09/24  Yes [provider]  RINVOQ  30 MG TB24 TAKE 1 TABLET BY MOUTH 1 TIME A DAY 06/12/24  Yes McGreal, Inocente HERO, MD  simethicone  (MYLICON) 125 MG chewable tablet Chew 240 mg by mouth every 6 (six) hours as needed for flatulence.   Yes Mahlon Comer BRAVO, MD  VICTOZA  18 MG/3ML SOPN Inject 1.8 mg into the skin daily.   Yes [provider]  zolpidem  (AMBIEN ) 10 MG tablet Take 1 tablet (10 mg total) by mouth at bedtime as needed for sleep. 04/25/24  Yes Billy Knee, FNP  amoxicillin -clavulanate (AUGMENTIN ) 875-125 MG tablet Take 1 tablet by mouth 2 (two) times daily. 02/10/24   Christopher Savannah, PA-C  lidocaine  (LIDODERM ) 5 % Place 1 patch onto the skin daily. Remove & Discard patch within 12 hours or as directed by MD 03/27/24   Jerral Meth, MD  oxyCODONE  (ROXICODONE ) 5 MG immediate release tablet Take 1 tablet (5 mg total) by mouth every 4 (four) hours as needed for severe pain (pain score 7-10). 04/04/24   Billy Knee, FNP    Family History Family History  Problem Relation Age of Onset   Prostate cancer Father    Hyperlipidemia Father    Barrett's esophagus Father    Cancer Father        prostate   Colon cancer Father 15       appendical adenocarcinoma 2011   Hypertension Father    Diabetes Sister    Diabetes Other        Grandparents   Heart attack Maternal Grandfather    Heart disease Maternal Grandfather    Colitis Maternal Grandfather    Ulcerative colitis Mother    Hypertension Mother    Hyperlipidemia Mother    Depression Mother    Colon polyps Mother    Ulcerative colitis Maternal Grandmother    Crohn's disease Brother     Esophageal cancer Neg Hx    Rectal cancer Neg Hx    Stomach cancer Neg Hx    Allergic rhinitis Neg Hx    Angioedema Neg Hx    Asthma Neg Hx    Atopy Neg Hx    Eczema Neg Hx    Immunodeficiency Neg Hx     Social History Social History   Tobacco Use   Smoking status: Former    Current packs/day: 0.00    Average packs/day: 0.5 packs/day for 25.0 years (12.5 ttl pk-yrs)    Types: Cigarettes    Start date: 02/27/1981    Quit date: 02/27/2006    Years since quitting: 18.4   Smokeless tobacco: Never  Vaping Use   Vaping status: Never Used  Substance Use Topics   Alcohol use: Yes    Alcohol/week: 3.0 standard drinks of alcohol    Types: 3 Glasses of wine per week    Comment: every other weekend socially   Drug use: No     Allergies   Tagamet [cimetidine], Effexor  [venlafaxine ], Mobic [meloxicam], and Ultram  [tramadol ]   Review of Systems Review of Systems  Gastrointestinal:        Possibly swallowed foreign body     Physical Exam Triage Vital Signs ED Triage Vitals  Encounter  Vitals Group     BP 07/26/24 0816 113/79     Girls Systolic BP Percentile --      Girls Diastolic BP Percentile --      Boys Systolic BP Percentile --      Boys Diastolic BP Percentile --      Pulse Rate 07/26/24 0816 81     Resp 07/26/24 0816 18     Temp 07/26/24 0816 (!) 97.3 F (36.3 C)     Temp Source 07/26/24 0816 Oral     SpO2 07/26/24 0816 98 %     Weight --      Height --      Head Circumference --      Peak Flow --      Pain Score 07/26/24 0818 0     Pain Loc --      Pain Education --      Exclude from Growth Chart --    No data found.  Updated Vital Signs BP 113/79 (BP Location: Left Arm)   Pulse 81   Temp (!) 97.3 F (36.3 C) (Oral)   Resp 18   LMP 09/02/2013   SpO2 98%   Visual Acuity Right Eye Distance:   Left Eye Distance:   Bilateral Distance:    Right Eye Near:   Left Eye Near:    Bilateral Near:     Physical Exam Vitals and nursing note reviewed.   Constitutional:      General: She is not in acute distress.    Appearance: Normal appearance. She is not ill-appearing.  HENT:     Head: Normocephalic and atraumatic.  Eyes:     Pupils: Pupils are equal, round, and reactive to light.  Cardiovascular:     Rate and Rhythm: Normal rate.  Pulmonary:     Effort: Pulmonary effort is normal.  Abdominal:     General: Bowel sounds are normal. There is no distension.     Palpations: Abdomen is soft.     Tenderness: There is no abdominal tenderness. There is no guarding or rebound.  Skin:    General: Skin is warm and dry.  Neurological:     General: No focal deficit present.     Mental Status: She is alert and oriented to person, place, and time.  Psychiatric:        Mood and Affect: Mood normal.        Behavior: Behavior normal.      UC Treatments / Results  Labs (all labs ordered are listed, but only abnormal results are displayed) Labs Reviewed - No data to display  EKG   Radiology DG Abdomen 1 View Result Date: 07/26/2024 CLINICAL DATA:  possible swallowed FB. Dental bridge. Patient is asymptomatic. EXAM: ABDOMEN - 1 VIEW; CHEST  1 VIEW COMPARISON:  Radiograph abdomen from 06/22/2023. FINDINGS: The bowel gas pattern is non-obstructive.  No abnormal stool burden. No evidence of pneumoperitoneum. Bilateral lung fields are clear. Bilateral costophrenic angles are clear. Normal cardio-mediastinal silhouette. No acute osseous abnormalities. The soft tissues are within normal limits. There are surgical clips in the right upper quadrant, typical of a previous cholecystectomy. No radiopaque foreign bodies. IMPRESSION: 1. No radiopaque foreign bodies. 2. Nonobstructive bowel gas pattern.  No pneumoperitoneum. 3. No active cardiopulmonary disease. Electronically Signed   By: Ree Molt M.D.   On: 07/26/2024 08:50   DG Chest 1 View Result Date: 07/26/2024 CLINICAL DATA:  possible swallowed FB. Dental bridge. Patient is asymptomatic. EXAM:  ABDOMEN -  1 VIEW; CHEST  1 VIEW COMPARISON:  Radiograph abdomen from 06/22/2023. FINDINGS: The bowel gas pattern is non-obstructive.  No abnormal stool burden. No evidence of pneumoperitoneum. Bilateral lung fields are clear. Bilateral costophrenic angles are clear. Normal cardio-mediastinal silhouette. No acute osseous abnormalities. The soft tissues are within normal limits. There are surgical clips in the right upper quadrant, typical of a previous cholecystectomy. No radiopaque foreign bodies. IMPRESSION: 1. No radiopaque foreign bodies. 2. Nonobstructive bowel gas pattern.  No pneumoperitoneum. 3. No active cardiopulmonary disease. Electronically Signed   By: Ree Molt M.D.   On: 07/26/2024 08:50    Procedures Procedures (including critical care time)  Medications Ordered in UC Medications - No data to display  Initial Impression / Assessment and Plan / UC Course  I have reviewed the triage vital signs and the nursing notes.  Pertinent labs & imaging results that were available during my care of the patient were reviewed by me and considered in my medical decision making (see chart for details).     Reviewed exam and symptoms with patient.  X-rays negative for foreign body.  Patient is not having any abdominal pain.  Discussed ER evaluation for further imaging to confirm her suspicions.  She declines at this time but I did advise her to go to emergency room ASAP if she develops any abdominal pain and she verbalized understanding.  Follow-up with PCP in 2 days for recheck. Final Clinical Impressions(s) / UC Diagnoses   Final diagnoses:  Swallowed foreign body, initial encounter     Discharge Instructions      There is no foreign body visible on your x-rays.  Please go to the emergency room for further imaging as well as if you develop any abdominal pain.     ED Prescriptions   None    PDMP not reviewed this encounter.   Loreda Myla SAUNDERS, NP 07/26/24 0900

## 2024-07-26 NOTE — ED Triage Notes (Signed)
 Patient presents to office for an x-ray.Patient is pretty sure she swallowed a dental bridge last night. Patient denies any pain at this time. Patient had one bowel movement last night.

## 2024-07-26 NOTE — Discharge Instructions (Addendum)
 There is no foreign body visible on your x-rays.  Please go to the emergency room for further imaging as well as if you develop any abdominal pain.

## 2024-07-27 NOTE — Telephone Encounter (Signed)
 Copied from CRM [id number]. Topic: Appointments - Transfer of Care >> Jul 27, 2024 12:01 PM [name] wrote: Pt is requesting to transfer FROM: Melissa Knee, FNP  Pt is requesting to transfer TO: Melissa Frieze, DO Reason for requested transfer: Establish New Care It is the responsibility of the team the patient would like to transfer to (Dr. Prentiss) to reach out to the patient if for any reason this transfer is not acceptable.

## 2024-08-20 ENCOUNTER — Ambulatory Visit: Admitting: Internal Medicine

## 2024-08-29 DIAGNOSIS — D849 Immunodeficiency, unspecified: Secondary | ICD-10-CM | POA: Diagnosis not present

## 2024-08-29 DIAGNOSIS — S61210A Laceration without foreign body of right index finger without damage to nail, initial encounter: Secondary | ICD-10-CM | POA: Diagnosis not present

## 2024-08-29 DIAGNOSIS — Z6829 Body mass index (BMI) 29.0-29.9, adult: Secondary | ICD-10-CM | POA: Diagnosis not present

## 2024-08-29 DIAGNOSIS — Z23 Encounter for immunization: Secondary | ICD-10-CM | POA: Diagnosis not present

## 2024-09-11 ENCOUNTER — Other Ambulatory Visit: Payer: Self-pay | Admitting: Internal Medicine

## 2024-09-14 NOTE — Telephone Encounter (Signed)
 Pt requesting refill zolpidem  (AMBIEN ) 10 MG tablet   LOV 03/22/24 FOV not scheduled LRF 04/25/24

## 2024-09-17 ENCOUNTER — Encounter: Payer: Self-pay | Admitting: Internal Medicine

## 2024-09-20 ENCOUNTER — Ambulatory Visit: Admitting: Internal Medicine

## 2024-10-01 ENCOUNTER — Other Ambulatory Visit: Payer: Self-pay

## 2024-10-01 DIAGNOSIS — M79641 Pain in right hand: Secondary | ICD-10-CM

## 2024-10-01 MED ORDER — CELECOXIB 200 MG PO CAPS: 200.0000 mg | ORAL_CAPSULE | Freq: Every day | ORAL | 1 refills | Status: DC

## 2024-10-09 ENCOUNTER — Ambulatory Visit

## 2024-10-09 ENCOUNTER — Other Ambulatory Visit: Payer: Self-pay | Admitting: Internal Medicine

## 2024-10-09 DIAGNOSIS — K5100 Ulcerative (chronic) pancolitis without complications: Secondary | ICD-10-CM

## 2024-10-10 ENCOUNTER — Telehealth: Payer: Self-pay

## 2024-10-10 NOTE — Telephone Encounter (Signed)
 SABRA

## 2024-10-11 DIAGNOSIS — Z6828 Body mass index (BMI) 28.0-28.9, adult: Secondary | ICD-10-CM | POA: Diagnosis not present

## 2024-10-11 DIAGNOSIS — M5416 Radiculopathy, lumbar region: Secondary | ICD-10-CM | POA: Diagnosis not present

## 2024-10-17 DIAGNOSIS — H25813 Combined forms of age-related cataract, bilateral: Secondary | ICD-10-CM | POA: Diagnosis not present

## 2024-10-17 DIAGNOSIS — H04123 Dry eye syndrome of bilateral lacrimal glands: Secondary | ICD-10-CM | POA: Diagnosis not present

## 2024-10-17 DIAGNOSIS — H0102A Squamous blepharitis right eye, upper and lower eyelids: Secondary | ICD-10-CM | POA: Diagnosis not present

## 2024-10-17 DIAGNOSIS — H0102B Squamous blepharitis left eye, upper and lower eyelids: Secondary | ICD-10-CM | POA: Diagnosis not present

## 2024-10-19 ENCOUNTER — Encounter: Payer: Self-pay | Admitting: Family Medicine

## 2024-10-19 ENCOUNTER — Ambulatory Visit (INDEPENDENT_AMBULATORY_CARE_PROVIDER_SITE_OTHER): Payer: Self-pay | Admitting: Family Medicine

## 2024-10-19 VITALS — BP 112/74 | HR 83 | Temp 83.0°F | Ht 63.0 in | Wt 164.4 lb

## 2024-10-19 DIAGNOSIS — D84821 Immunodeficiency due to drugs: Secondary | ICD-10-CM

## 2024-10-19 DIAGNOSIS — M8589 Other specified disorders of bone density and structure, multiple sites: Secondary | ICD-10-CM

## 2024-10-19 DIAGNOSIS — F5101 Primary insomnia: Secondary | ICD-10-CM

## 2024-10-19 DIAGNOSIS — Z79899 Other long term (current) drug therapy: Secondary | ICD-10-CM

## 2024-10-19 DIAGNOSIS — R632 Polyphagia: Secondary | ICD-10-CM

## 2024-10-19 DIAGNOSIS — F5104 Psychophysiologic insomnia: Secondary | ICD-10-CM

## 2024-10-19 DIAGNOSIS — F5081 Binge eating disorder, mild: Secondary | ICD-10-CM

## 2024-10-19 DIAGNOSIS — Z59868 Other specified financial insecurity: Secondary | ICD-10-CM

## 2024-10-19 DIAGNOSIS — Z6829 Body mass index (BMI) 29.0-29.9, adult: Secondary | ICD-10-CM

## 2024-10-19 DIAGNOSIS — M2550 Pain in unspecified joint: Secondary | ICD-10-CM

## 2024-10-19 DIAGNOSIS — K5100 Ulcerative (chronic) pancolitis without complications: Secondary | ICD-10-CM

## 2024-10-19 DIAGNOSIS — Z780 Asymptomatic menopausal state: Secondary | ICD-10-CM

## 2024-10-19 DIAGNOSIS — E663 Overweight: Secondary | ICD-10-CM

## 2024-10-19 DIAGNOSIS — M79641 Pain in right hand: Secondary | ICD-10-CM

## 2024-10-19 DIAGNOSIS — Z92241 Personal history of systemic steroid therapy: Secondary | ICD-10-CM

## 2024-10-19 MED ORDER — CELECOXIB 200 MG PO CAPS
200.0000 mg | ORAL_CAPSULE | Freq: Two times a day (BID) | ORAL | 0 refills | Status: AC
Start: 2024-10-19 — End: ?

## 2024-10-19 MED ORDER — ONDANSETRON HCL 4 MG PO TABS: 4.0000 mg | ORAL_TABLET | ORAL | 0 refills | Status: AC

## 2024-10-19 MED ORDER — ZOLPIDEM TARTRATE 10 MG PO TABS: 10.0000 mg | ORAL_TABLET | Freq: Every day | ORAL | 2 refills | Status: AC

## 2024-10-19 MED ORDER — LISDEXAMFETAMINE DIMESYLATE 40 MG PO CHEW: 1.0000 | CHEWABLE_TABLET | Freq: Every morning | ORAL | 0 refills | Status: DC

## 2024-10-19 NOTE — Progress Notes (Unsigned)
 Assessment & Plan:   1. Primary insomnia (Primary) *** - zolpidem  (AMBIEN ) 10 MG tablet; Take 1 tablet (10 mg total) by mouth at bedtime.  Dispense: 30 tablet; Refill: 2  2. Bilateral hand pain *** - celecoxib  (CELEBREX ) 200 MG capsule; Take 1 capsule (200 mg total) by mouth 2 (two) times daily.  Dispense: 180 capsule; Refill: 0  3. Ulcerative pancolitis without complication (HCC) *** - ondansetron  (ZOFRAN ) 4 MG tablet; Take 1 tablet (4 mg) by mouth as directed.  Dispense: 20 tablet; Refill: 0  4. Polyphagia *** - Lisdexamfetamine Dimesylate  40 MG CHEW; Chew 1 tablet (40 mg total) by mouth every morning.  Dispense: 30 tablet; Refill: 0   No orders of the defined types were placed in this encounter.  Medications Discontinued During This Encounter  Medication Reason   lidocaine  (LIDODERM ) 5 % Completed Course   amoxicillin -clavulanate (AUGMENTIN ) 875-125 MG tablet Completed Course   ondansetron  (ZOFRAN ) 4 MG tablet Reorder   zolpidem  (AMBIEN ) 10 MG tablet Reorder   celecoxib  (CELEBREX ) 200 MG capsule Reorder   Lisdexamfetamine Dimesylate  40 MG CHEW Reorder    Meds ordered this encounter  Medications   zolpidem  (AMBIEN ) 10 MG tablet    Sig: Take 1 tablet (10 mg total) by mouth at bedtime.    Dispense:  30 tablet    Refill:  2    Not to exceed 4 additional fills before 09/16/2024   celecoxib  (CELEBREX ) 200 MG capsule    Sig: Take 1 capsule (200 mg total) by mouth 2 (two) times daily.    Dispense:  180 capsule    Refill:  0   ondansetron  (ZOFRAN ) 4 MG tablet    Sig: Take 1 tablet (4 mg) by mouth as directed.    Dispense:  20 tablet    Refill:  0   Lisdexamfetamine Dimesylate  40 MG CHEW    Sig: Chew 1 tablet (40 mg total) by mouth every morning.    Dispense:  30 tablet    Refill:  0     Assessment and Plan Assessment & Plan Osteoporosis Exacerbated by long-term steroid use for ulcerative colitis. Discussed HRT for bone health considering menopausal status. Estrogen  patch and oral progesterone recommended. - Initiated hormone replacement therapy with estrogen patch and oral progesterone. - Recommended Prolia for osteoporosis management, administered as a biannual injection.  Chronic low back pain with radiculopathy Pain localized to buttocks, managed with gabapentin , Celebrex , and Tylenol . No recent severe sciatica episodes. - Continue gabapentin , Celebrex , and Tylenol  for pain management.  Chronic left knee pain Managed with Celebrex  and Tylenol . No recent increase in joint stiffness or pain. - Continue Celebrex  and Tylenol  for knee pain management.  Obesity and metabolic syndrome Current treatment with Victoza  ineffective for weight loss. Discussed switch to Ozempic  or Wegovy , considering insurance coverage and cost. - Explore insurance options for Ozempic  or Wegovy  coverage. - Consider FibroScan to assess fatty liver disease for potential Ozempic  coverage.  Fatty liver disease Discussed need for FibroScan to assess severity and insurance coverage. - Ordered FibroScan to assess fatty liver disease severity.  Ulcerative pancolitis Long-term steroid use. No current exacerbations or need for steroid treatment.  Immunocompromised status Due to ulcerative pancolitis and medication use. Discussed RSV vaccination due to immunocompromised status and travel history. - Recommended RSV vaccination at pharmacy.  General Health Maintenance Discussed flu vaccination and RSV vaccination due to immunocompromised status. Mammogram is up to date. - Ensure flu vaccination is up to date. - Recommended RSV vaccination at  pharmacy.  Geni Shutter, DO  [facility] [phone] (phone) [phone] (fax) Subjective:  Discussed the use of AI scribe software for clinical note transcription with the patient, who gave verbal consent to proceed.  History of Present Illness Melissa Cohen is a 58 year old female with  ulcerative colitis and osteoporosis who presents for evaluation of back pain and medication management.  Lumbosacral pain and sciatica - Chronic back pain with history of slipped or bulging disc, initially affecting the right side, now the left - Previous severe sciatica, currently pain localized to buttocks and hip without radiation down the leg - Received steroid injections in the sacroiliac joints bilaterally with temporary relief - Uses Celebrex  twice daily, gabapentin  300 mg three times daily, and Tylenol  for pain control - Avoids ibuprofen due to concurrent Celebrex  use - No recent use of opiates - Self-administered steroid dose pack a few months ago with symptomatic improvement  Osteoporosis - History of frequent steroid use due to ulcerative colitis - Bone density test performed within the past year - Not currently on pharmacologic treatment for osteoporosis  Knee arthralgia - Knee pain, primarily in the left knee - Uses Celebrex  for symptom management - No recent stiffness or additional joint pain  Obesity and metabolic syndrome - Elevated BMI with history of fatty liver and metabolic syndrome - Uses Victoza  for weight management but finds it ineffective - Previously used Ozempic ; considering alternatives due to insurance coverage concerns - Intermittent use of Vyvanse  for appetite suppression, but experiences side effects - Weight gain when not on medication  Sleep apnea - Mild sleep apnea  All past medical history, surgical history, allergies, family history, immunizations andmedications were updated in the EMR today and reviewed under the history and medication portions of their EMR. Review of Systems: Negative, with the exception of above mentioned in HPI.   Current Outpatient Medications:    acetaminophen  (TYLENOL ) 500 MG tablet, Take 2 tablets (1,000 mg total) by mouth every 8 (eight) hours as needed for mild pain (pain score 1-3) or moderate pain (pain score 4-6).,  Disp: 30 tablet, Rfl: 0   albuterol  (VENTOLIN  HFA) 108 (90 Base) MCG/ACT inhaler, Inhale 2 puffs into the lungs every 6 (six) hours as needed for wheezing or shortness of breath., Disp: 8 g, Rfl: 0   B Complex-C (SUPER B COMPLEX PO), Take 1 tablet by mouth every other day., Disp: , Rfl:    Biotin 89999 MCG TABS, Take 10,000 mcg by mouth daily., Disp: , Rfl:    buPROPion  (WELLBUTRIN  XL) 300 MG 24 hr tablet, Take 1 tablet (300 mg total) by mouth daily., Disp: 90 tablet, Rfl: 1   Cholecalciferol (VITAMIN D3) 125 MCG (5000 UT) CAPS, Take 1 capsule by mouth daily., Disp: , Rfl:    cyclobenzaprine  (FLEXERIL ) 5 MG tablet, Take 1 tablet (5 mg total) by mouth at bedtime as needed., Disp: 30 tablet, Rfl: 0   esomeprazole  (NEXIUM ) 40 MG capsule, TAKE 1 CAPSULE DAILY, Disp: 90 capsule, Rfl: 1   famotidine  (PEPCID ) 40 MG tablet, TAKE ONE TABLET BY MOUTH ONE TIME DAILY, Disp: 90 tablet, Rfl: 3   gabapentin  (NEURONTIN ) 300 MG capsule, Take 1 capsule (300 mg total) by mouth 3 (three) times daily., Disp: 270 capsule, Rfl: 1   oxyCODONE  (ROXICODONE ) 5 MG immediate release tablet, Take 1 tablet (5 mg total) by mouth every 4 (four) hours as needed for severe pain (pain score 7-10)., Disp: 20 tablet, Rfl: 0   QULIPTA  60 MG TABS, daily., Disp: , Rfl:    RINVOQ  30 MG TB24, TAKE 1 TABLET BY MOUTH 1 TIME A DAY, Disp: 30 tablet, Rfl: 6   simethicone  (MYLICON) 125 MG chewable tablet, Chew 240 mg by mouth every 6 (six) hours as needed for flatulence., Disp: , Rfl:    VICTOZA  18 MG/3ML SOPN, Inject 1.8 mg into the skin daily., Disp: , Rfl:    celecoxib  (CELEBREX ) 200 MG capsule, Take 1 capsule (200 mg total) by mouth 2 (two) times daily., Disp: 180 capsule, Rfl: 0   Lisdexamfetamine Dimesylate  40 MG CHEW, Chew 1 tablet (40 mg total) by mouth every morning., Disp: 30 tablet, Rfl: 0   ondansetron  (ZOFRAN ) 4 MG tablet, Take 1 tablet (4 mg) by mouth as directed., Disp: 20 tablet, Rfl: 0   zolpidem  (AMBIEN ) 10 MG tablet, Take 1  tablet (10 mg total) by mouth at bedtime., Disp: 30 tablet, Rfl: 2     Objective:     VITALS: BP 112/74 (BP Location: Right Arm, Cuff Size: Normal)   Pulse 83   Temp (!) 83 F (28.3 C)   Ht 5' 3 (1.6 m)   Wt 164 lb 6.4 oz (74.6 kg)   LMP 09/02/2013   SpO2 98%   BMI 29.12 kg/m  Wt Readings from Last 3 Encounters:  10/19/24 164 lb 6.4 oz (74.6 kg)  04/03/24 167 lb (75.8 kg)  03/27/24 162 lb 11.2 oz (73.8 kg)    PHYSICAL EXAM: General: The patient is well-nourished, in no acute distress. The vital signs are documented above. Cardiac: There is a regular rate and rhythm.  Pulmonary: There is good air exchange bilaterally without wheezing or rales. Abdomen: Soft and non-tender with normal pitched bowel sounds.  Musculoskeletal: There are no major deformities or cyanosis. Neurologi: No focal weakness or paresthesias are detected. Skin: There are no ulcers or rashes noted. Psychiatric: The patient has a normal affect.  LABS: Lab Results  Component Value Date   CREATININE 0.58 03/27/2024   BUN 19 03/27/2024   NA 139 03/27/2024   K 4.2 03/27/2024   CL 106 03/27/2024   CO2 24 03/27/2024   Lab Results  Component Value Date   ALT 86 (H) 03/27/2024   AST 50 (H) 03/27/2024   ALKPHOS 71 03/27/2024   BILITOT 0.3 03/27/2024   Lab Results  Component Value Date   HGBA1C 5.1 09/09/2022   HGBA1C 4.7 (L) 02/09/2022   HGBA1C 5.2 04/08/2020   Lab Results  Component Value Date   INSULIN  9.5 02/09/2022   INSULIN  19.6 04/08/2020   Lab Results  Component Value Date   TSH 1.50 03/01/2024   Lab Results  Component Value Date   CHOL 255 (H) 03/01/2024   HDL 71.70 03/01/2024   LDLCALC 153 (H) 03/01/2024   LDLDIRECT 104.1 12/07/2011   TRIG 153.0 (H) 03/01/2024   CHOLHDL 4 03/01/2024   Lab Results  Component Value Date   VD25OH 60.44 03/01/2024   VD25OH 87.72 09/09/2022   VD25OH 96.86 09/07/2022   Lab Results  Component Value Date   WBC 8.5 03/27/2024   HGB 12.7  03/27/2024   HCT 38.9 03/27/2024   MCV 94.6 03/27/2024   PLT 371 03/27/2024   Lab Results  Component Value Date   IRON 94 09/24/2022   TIBC 350 09/24/2022   FERRITIN 28 09/24/2022

## 2024-10-21 ENCOUNTER — Encounter: Payer: Self-pay | Admitting: Family Medicine

## 2024-10-21 DIAGNOSIS — M2550 Pain in unspecified joint: Secondary | ICD-10-CM | POA: Insufficient documentation

## 2024-10-21 DIAGNOSIS — Z6829 Body mass index (BMI) 29.0-29.9, adult: Secondary | ICD-10-CM | POA: Insufficient documentation

## 2024-10-21 DIAGNOSIS — D84821 Immunodeficiency due to drugs: Secondary | ICD-10-CM | POA: Insufficient documentation

## 2024-10-21 DIAGNOSIS — M5126 Other intervertebral disc displacement, lumbar region: Secondary | ICD-10-CM | POA: Insufficient documentation

## 2024-10-21 DIAGNOSIS — M1380 Other specified arthritis, unspecified site: Secondary | ICD-10-CM | POA: Insufficient documentation

## 2024-10-21 DIAGNOSIS — Z59868 Other specified financial insecurity: Secondary | ICD-10-CM | POA: Insufficient documentation

## 2024-10-21 DIAGNOSIS — K760 Fatty (change of) liver, not elsewhere classified: Secondary | ICD-10-CM | POA: Insufficient documentation

## 2024-10-21 DIAGNOSIS — Z780 Asymptomatic menopausal state: Secondary | ICD-10-CM | POA: Insufficient documentation

## 2024-10-21 DIAGNOSIS — M8589 Other specified disorders of bone density and structure, multiple sites: Secondary | ICD-10-CM | POA: Insufficient documentation

## 2024-10-21 DIAGNOSIS — M47816 Spondylosis without myelopathy or radiculopathy, lumbar region: Secondary | ICD-10-CM | POA: Insufficient documentation

## 2024-10-21 DIAGNOSIS — Z92241 Personal history of systemic steroid therapy: Secondary | ICD-10-CM | POA: Insufficient documentation

## 2024-10-21 DIAGNOSIS — M1990 Unspecified osteoarthritis, unspecified site: Secondary | ICD-10-CM | POA: Insufficient documentation

## 2024-10-21 DIAGNOSIS — Z79899 Other long term (current) drug therapy: Secondary | ICD-10-CM | POA: Insufficient documentation

## 2024-10-21 MED ORDER — PREDNISONE 10 MG PO TABS: 10.0000 mg | ORAL_TABLET | Freq: Every day | ORAL | 0 refills | Status: DC

## 2024-10-23 NOTE — Telephone Encounter (Signed)
 Dr Prentiss please see message from pt. Let me know if you would like for me to send in an Rx for the Ozempic . Thanks!

## 2024-10-29 MED ORDER — ESTRADIOL 0.025 MG/24HR TD PTWK: 0.0250 mg | MEDICATED_PATCH | TRANSDERMAL | 12 refills | Status: DC

## 2024-10-29 MED ORDER — PROGESTERONE MICRONIZED 100 MG PO CAPS: 100.0000 mg | ORAL_CAPSULE | Freq: Every day | ORAL | 2 refills | Status: AC

## 2024-10-29 NOTE — Addendum Note (Signed)
 Addended by: PRENTISS FRIEZE R on: 10/29/2024 12:04 PM   Modules accepted: Orders

## 2024-11-06 ENCOUNTER — Ambulatory Visit: Attending: Cardiology | Admitting: Cardiology

## 2024-11-06 ENCOUNTER — Ambulatory Visit: Admitting: Internal Medicine

## 2024-11-06 ENCOUNTER — Encounter: Payer: Self-pay | Admitting: Cardiology

## 2024-11-06 VITALS — BP 119/76 | HR 72 | Resp 16 | Ht 63.0 in | Wt 165.0 lb

## 2024-11-06 DIAGNOSIS — R55 Syncope and collapse: Secondary | ICD-10-CM

## 2024-11-06 DIAGNOSIS — R209 Unspecified disturbances of skin sensation: Secondary | ICD-10-CM | POA: Diagnosis not present

## 2024-11-06 DIAGNOSIS — E8881 Metabolic syndrome: Secondary | ICD-10-CM

## 2024-11-06 DIAGNOSIS — R42 Dizziness and giddiness: Secondary | ICD-10-CM | POA: Diagnosis not present

## 2024-11-06 DIAGNOSIS — R011 Cardiac murmur, unspecified: Secondary | ICD-10-CM | POA: Diagnosis not present

## 2024-11-06 DIAGNOSIS — R0602 Shortness of breath: Secondary | ICD-10-CM | POA: Diagnosis not present

## 2024-11-06 DIAGNOSIS — R0609 Other forms of dyspnea: Secondary | ICD-10-CM | POA: Insufficient documentation

## 2024-11-06 DIAGNOSIS — E785 Hyperlipidemia, unspecified: Secondary | ICD-10-CM | POA: Diagnosis not present

## 2024-11-06 NOTE — Progress Notes (Unsigned)
 Cardiology Office Note:  .   Date:  11/06/2024  ID:  Shona LITTIE Broom, DOB 09-26-1966, MRN [id number] PCP: Prentiss Frieze, DO  [facility] Providers Cardiologist:  Alm Clay, MD { Click to update primary MD,subspecialty MD or APP then REFRESH:1}    Chief Complaint  Patient presents with   Shortness of Breath   Tiredness   New Patient (Initial Visit)    Patient Profile: .     Melissa Cohen is a *** 58 y.o. female *** with a PMH notable for *** who presents here for *** at the request of Billy Knee, FNP.  {There is no content from the last Narrative History section.}      Melissa Cohen was last seen on ***  Subjective  Discussed the use of AI scribe software for clinical note transcription with the patient, who gave verbal consent to proceed.  History of Present Illness      Cardiovascular ROS: {roscv:310661}  ROS:  Review of Systems - {ros master:310782}    Objective    Studies Reviewed: SABRA   EKG Interpretation Date/Time:  Tuesday November 06 2024 09:25:44 EST Ventricular Rate:  72 PR Interval:  136 QRS Duration:  96 QT Interval:  406 QTC Calculation: 444 R Axis:   -7  Text Interpretation: Normal sinus rhythm Normal ECG When compared with ECG of 27-Mar-2024 12:44, No significant change since last tracing Confirmed by Clay Alm ([id number]) on 11/06/2024 10:11:39 AM    Results  ECHO: *** CATH: *** MONITOR: *** CT: ***  Risk Assessment/Calculations:   {Does this patient have ATRIAL FIBRILLATION?:[phone]}           Physical Exam:   VS:  BP 119/76 (BP Location: Left Arm, Patient Position: Sitting, Cuff Size: Normal)   Pulse 72   Resp 16   Ht 5' 3 (1.6 m)   Wt 165 lb (74.8 kg)   LMP 09/02/2013   SpO2 100%   BMI 29.23 kg/m    Wt Readings from Last 3 Encounters:  11/06/24 165 lb (74.8 kg)  10/19/24 164 lb 6.4 oz (74.6 kg)  04/03/24 167 lb (75.8 kg)    Physical Exam    GEN: Well nourished, well developed in no  acute distress; *** NECK: No JVD; No carotid bruits CARDIAC: Normal S1, S2; RRR, no murmurs, rubs, gallops RESPIRATORY:  Clear to auscultation without rales, wheezing or rhonchi ; nonlabored, good air movement. ABDOMEN: Soft, non-tender, non-distended EXTREMITIES:  No edema; No deformity      ASSESSMENT AND PLAN: .    Problem List Items Addressed This Visit     Bilateral cold feet   DOE (dyspnea on exertion)   Metabolic syndrome   Systolic murmur   Vasovagal near syncope   Other Visit Diagnoses       Shortness of breath    -  Primary   Relevant Orders   EKG 12-Lead (Completed)       Assessment and Plan Assessment & Plan        {Are you ordering a CV Procedure (e.g. stress test, cath, DCCV, TEE, etc)?   Press F2        :[id number]}   Follow-Up: No follow-ups on file.  I spent *** minutes in the care of Melissa Cohen today including {CHL AMB CAR Time Based Billing Options STW (Optional):[phone]::documenting in the encounter.}      Signed, Alm MICAEL Clay, MD, MS Alm Clay, M.D., M.S. Interventional Cardiologist  [facility] Pager # [phone]

## 2024-11-06 NOTE — Patient Instructions (Signed)
 Medication Instructions:   No change  *If you need a refill on your cardiac medications before your next appointment, please call your pharmacy*   Lab Work: Not  needed    Testing/Procedures:  1) Your physician has recommended that you have a cardiopulmonary stress test (CPX). CPX testing is a non-invasive measurement of heart and lung function. It replaces a traditional treadmill stress test. This type of test provides a tremendous amount of information that relates not only to your present condition but also for future outcomes. This test combines measurements of you ventilation, respiratory gas exchange in the lungs, electrocardiogram (EKG), blood pressure and physical response before, during, and following an exercise protocol.   2) Your physician has requested that you have an ankle brachial index (ABI). During this test an ultrasound and blood pressure cuff are used to evaluate the arteries that supply the legs with blood. Allow thirty minutes for this exam. There are no restrictions or special instructions.  Please note: We ask at that you not bring children with you during ultrasound (echo/ vascular) testing. Due to room size and safety concerns, children are not allowed in the ultrasound rooms during exams. Our front office staff cannot provide observation of children in our lobby area while testing is being conducted. An adult accompanying a patient to their appointment will only be allowed in the ultrasound room at the discretion of the ultrasound technician under special circumstances. We apologize for any inconvenience.    Follow-Up: At [facility], you and your health needs are our priority.  As part of our continuing mission to provide you with exceptional heart care, we have created designated Provider Care Teams.  These Care Teams include your primary Cardiologist (physician) and Advanced Practice Providers (APPs -  Physician Assistants and Nurse Practitioners) who all work  together to provide you with the care you need, when you need it.     Your next appointment:   1  to 4 month(s)  The format for your next appointment:   In Person  Provider:   Alm Clay, MD or APP   Other Instructions    CPX TEST PATIENT INSTRUCTIONS    You are scheduled for a Cardiopulmonary Exercise (CPX) Test at [facility] on:  ____/____/____ at ____:____ am/pm.  Expect to be in the lab for 2 hours. Please plan to arrive 30 MINUTES PRIOR to your appointment. You may be asked to reschedule your test if you arrive 20 minutes or more after your scheduled appointment time.  Main Campus address: [address] [place], [place] [address]  *You may arrive to Main Entrance A or Entrance C (Free valet parking is available at both).  - Main Entrance A (from [facility]): proceed to admitting for check-in  - Entrance C (from [facility]): proceed to [employer] parking or under hospital deck parking using this code _______________  Check-in: [facility] waiting room   General instructions for the day of the test (Please follow all instructions from your physician):   Refrain from ingesting a heavy meal, alcohol, or caffeine or using tobacco products within 2 hours of the test (DO NOT FAST for more than 8 hours). You may have all other non-alcoholic, non-caffeinated beverages a light snack (crackers, a piece of fruit, carrot sticks, toast, bagel, etc.) up to your appointment time.   Avoid significant exertion or exercise within 24 hours of your test.   Be prepared to exercise and sweat. Your clothing should permit freedom  of movement and include walking or running shoes. Women bring loose-fitting, short-sleeved blouse.   This evaluation may be fatiguing, and you may wish to have someone accompany you to the assessment to drive you home afterward.   Bring a list of your medications with you, including dosage and frequency you take the medication  (i.e., once per day, twice per day, etc).   Take all medications as prescribed, unless noted below or instructed to do so by your physician.  o Please do not take the following medications prior to your CPX:  Brief description of the test:  A brief lung function test will be performed. This will involve you taking deep breaths and blowing out hard and fast through your mouth. During these, a clip will be on your nose and you will be breathing through a breathing device.  For the exercise portion of the test you will be walking on a treadmill, or riding a stationary bike, to your maximal effort or until symptoms such as chest pain, shortness of breath, leg pain or dizziness limit your exercise. You will be breathing in and out of a breathing device through your mouth (a clip will be on your nose again). Your heart rate, ECG, blood pressure, oxygen saturation, breathing rate and depth, amount of oxygen you consume and amount of carbon dioxide you produce will be measured and monitored throughout the exercise test.  If you need to cancel or reschedule your appointment please call: [phone]  If you have any further questions please call your physician or Josette Pesa, MS, ACSM-

## 2024-11-07 ENCOUNTER — Encounter: Payer: Self-pay | Admitting: Cardiology

## 2024-11-07 DIAGNOSIS — M5136 Other intervertebral disc degeneration, lumbar region with discogenic back pain only: Secondary | ICD-10-CM | POA: Diagnosis not present

## 2024-11-07 DIAGNOSIS — M48061 Spinal stenosis, lumbar region without neurogenic claudication: Secondary | ICD-10-CM | POA: Diagnosis not present

## 2024-11-07 DIAGNOSIS — M5137 Other intervertebral disc degeneration, lumbosacral region with discogenic back pain only: Secondary | ICD-10-CM | POA: Diagnosis not present

## 2024-11-07 DIAGNOSIS — M5126 Other intervertebral disc displacement, lumbar region: Secondary | ICD-10-CM | POA: Diagnosis not present

## 2024-11-07 DIAGNOSIS — M5416 Radiculopathy, lumbar region: Secondary | ICD-10-CM | POA: Diagnosis not present

## 2024-11-07 NOTE — Assessment & Plan Note (Signed)
 Lipids as of April were very poorly controlled.  She is not currently on any medications for management.  Can discuss management based on upcoming test results.

## 2024-11-07 NOTE — Assessment & Plan Note (Addendum)
 Exertional dyspnea  Intermittent exertional dyspnea and orthostatic symptoms improved.  Echocardiogram and coronary calcium  score normal, ruling out significant cardiac issues.  Differential includes deconditioning, pulmonary issues, or long COVID.   - Ordered cardiopulmonary stress test to evaluate exertional limitations and differentiate between cardiac, pulmonary, or deconditioning causes.

## 2024-11-07 NOTE — Assessment & Plan Note (Signed)
 Cold feet with possible Raynaud's phenomenon Reports of cold feet and possible Raynaud's phenomenon with purple discoloration of toes. Adequate pulses in legs, further evaluation needed to rule out peripheral vascular disease. - Checked blood pressures in arms versus legs to assess for peripheral vascular disease. - Consider ultrasound of lower extremities if blood pressure discrepancies are noted.

## 2024-11-07 NOTE — Assessment & Plan Note (Signed)
 Orthostatic symptoms may relate to inadequate hydration. - Advised increasing water intake to 3-4 bottles per day, with consideration of adding electrolytes or snacks to improve fluid retention. - Monitor for recurrence of tachycardia and report if symptoms return.

## 2024-11-07 NOTE — Assessment & Plan Note (Signed)
 Cardiac murmur noted, but echocardiogram shows normal ventricular function, valves, and no wall motion abnormalities. Murmur may not be clinically significant. - Continue monitoring cardiac function and symptoms.

## 2024-11-08 ENCOUNTER — Encounter: Payer: Self-pay | Admitting: Cardiology

## 2024-11-08 ENCOUNTER — Other Ambulatory Visit: Payer: Self-pay

## 2024-11-08 DIAGNOSIS — R0602 Shortness of breath: Secondary | ICD-10-CM

## 2024-11-08 DIAGNOSIS — R0609 Other forms of dyspnea: Secondary | ICD-10-CM

## 2024-11-08 NOTE — Telephone Encounter (Signed)
 Please see message from pt below. She would like to know if she can start the prednisone  script that you gave her, for increased lower back pain. Shee has a follow scheduled for 12/07/2023 for [facility]. She had an MRI done yesterday.

## 2024-11-08 NOTE — Telephone Encounter (Signed)
 We talked about this when I saw her but I guess she did not mention her symptoms of sciatica.  The whole idea was to see her exercise tolerance which we cannot replicate with medicines.  The idea was to look at the difference tween cardiac and pulmonary issues.  Perhaps the most effective way to evaluate for coronary artery disease is a cardiac stress PET which I believe can be done with chemical conversion.  If not covered, then would just use a Lexiscan Myoview.  First choice would be cardiac stress PET    Alm Clay, MD

## 2024-11-08 NOTE — Progress Notes (Unsigned)
 Attestation for cardiac stress PET.

## 2024-11-12 ENCOUNTER — Encounter (HOSPITAL_COMMUNITY): Payer: Self-pay | Admitting: Cardiology

## 2024-11-12 NOTE — Telephone Encounter (Signed)
 Thats fine - we can convert.  Sorry to hear about her back    [facility]

## 2024-11-13 ENCOUNTER — Other Ambulatory Visit: Payer: Self-pay | Admitting: Cardiology

## 2024-11-13 ENCOUNTER — Telehealth (HOSPITAL_COMMUNITY): Payer: Self-pay | Admitting: *Deleted

## 2024-11-13 ENCOUNTER — Ambulatory Visit (HOSPITAL_COMMUNITY): Admission: RE | Admit: 2024-11-13 | Discharge: 2024-11-13 | Attending: Cardiology | Admitting: Cardiology

## 2024-11-13 DIAGNOSIS — R209 Unspecified disturbances of skin sensation: Secondary | ICD-10-CM | POA: Insufficient documentation

## 2024-11-13 DIAGNOSIS — R0609 Other forms of dyspnea: Secondary | ICD-10-CM

## 2024-11-13 DIAGNOSIS — R0602 Shortness of breath: Secondary | ICD-10-CM

## 2024-11-13 LAB — VAS US ABI WITH/WO TBI
Left ABI: 1.14
Right ABI: 1.13

## 2024-11-13 NOTE — Telephone Encounter (Signed)
 Pt given stress test instructions.

## 2024-11-14 ENCOUNTER — Encounter: Payer: Self-pay | Admitting: Pediatrics

## 2024-11-14 ENCOUNTER — Ambulatory Visit: Payer: Self-pay | Admitting: Cardiology

## 2024-11-14 NOTE — Telephone Encounter (Signed)
 Copied from CRM #[id number]. Topic: General - Other >> Nov 14, 2024  1:53 PM Mercedes MATSU wrote: Reason for CRM: Pt wanted to confirm what insurances we will be accepting in the new year.

## 2024-11-15 ENCOUNTER — Ambulatory Visit: Payer: Self-pay | Admitting: Cardiology

## 2024-11-15 ENCOUNTER — Ambulatory Visit (HOSPITAL_COMMUNITY): Admission: RE | Admit: 2024-11-15 | Discharge: 2024-11-15 | Attending: Cardiology | Admitting: Cardiology

## 2024-11-15 DIAGNOSIS — R0602 Shortness of breath: Secondary | ICD-10-CM | POA: Insufficient documentation

## 2024-11-15 DIAGNOSIS — R0609 Other forms of dyspnea: Secondary | ICD-10-CM | POA: Diagnosis not present

## 2024-11-15 LAB — MYOCARDIAL PERFUSION IMAGING
LV dias vol: 60 mL (ref 46–106)
LV sys vol: 16 mL (ref 3.8–5.2)
Nuc Stress EF: 73 %
Peak HR: 90 {beats}/min
Rest HR: 69 {beats}/min
Rest Nuclear Isotope Dose: 10.4 mCi
SDS: 0
SRS: 0
SSS: 0
ST Depression (mm): 0 mm
Stress Nuclear Isotope Dose: 31.9 mCi
TID: 0.79

## 2024-11-15 MED ORDER — REGADENOSON 0.4 MG/5ML IV SOLN
0.4000 mg | Freq: Once | INTRAVENOUS | Status: AC
  Administered 2024-11-15: 10:00:00 0.4 mg via INTRAVENOUS

## 2024-11-15 MED ORDER — TECHNETIUM TC 99M TETROFOSMIN IV KIT
10.4000 | PACK | Freq: Once | INTRAVENOUS | Status: AC | PRN
  Administered 2024-11-15: 08:00:00 10.4 via INTRAVENOUS

## 2024-11-15 MED ORDER — REGADENOSON 0.4 MG/5ML IV SOLN
INTRAVENOUS | Status: AC
  Filled 2024-11-15: qty 5

## 2024-11-15 MED ADMIN — Technetium Tc 99m Tetrofosmin IV for Soln Kit: 31.9 | INTRAVENOUS | @ 10:00:00 | NDC 17156002405

## 2024-11-15 NOTE — Telephone Encounter (Signed)
 Her insurance does not go into effect until January 1st and therefore I will have to wait until that day to be able to submit any prior authorization requests. I have taken note of the requested medications and will submit at that time.

## 2024-11-19 ENCOUNTER — Ambulatory Visit: Admitting: Internal Medicine

## 2024-11-19 ENCOUNTER — Ambulatory Visit: Admitting: Family Medicine

## 2024-11-19 DIAGNOSIS — M5416 Radiculopathy, lumbar region: Secondary | ICD-10-CM | POA: Diagnosis not present

## 2024-11-20 ENCOUNTER — Encounter: Payer: Self-pay | Admitting: Family Medicine

## 2024-11-20 ENCOUNTER — Ambulatory Visit (INDEPENDENT_AMBULATORY_CARE_PROVIDER_SITE_OTHER): Admitting: Family Medicine

## 2024-11-20 VITALS — BP 112/78 | HR 86 | Ht 63.0 in | Wt 164.0 lb

## 2024-11-20 DIAGNOSIS — F5104 Psychophysiologic insomnia: Secondary | ICD-10-CM

## 2024-11-20 DIAGNOSIS — R632 Polyphagia: Secondary | ICD-10-CM

## 2024-11-20 DIAGNOSIS — M5116 Intervertebral disc disorders with radiculopathy, lumbar region: Secondary | ICD-10-CM

## 2024-11-20 DIAGNOSIS — M8589 Other specified disorders of bone density and structure, multiple sites: Secondary | ICD-10-CM

## 2024-11-20 DIAGNOSIS — R6889 Other general symptoms and signs: Secondary | ICD-10-CM

## 2024-11-20 DIAGNOSIS — Z59868 Other specified financial insecurity: Secondary | ICD-10-CM

## 2024-11-20 DIAGNOSIS — E663 Overweight: Secondary | ICD-10-CM

## 2024-11-20 DIAGNOSIS — K760 Fatty (change of) liver, not elsewhere classified: Secondary | ICD-10-CM

## 2024-11-20 DIAGNOSIS — Z6829 Body mass index (BMI) 29.0-29.9, adult: Secondary | ICD-10-CM

## 2024-11-20 NOTE — Progress Notes (Signed)
 "  Subjective:   History of Present Illness Melissa Cohen is a 58 year old female with chronic back pain and peripheral artery disease who presents for management of her pain and circulation issues.  Peripheral vascular symptoms - Chronic cold feet managed with warm socks and a space heater at night - Recent stress test demonstrated reduced toe pressures  Chronic back pain - Chronic back pain with recent MRI showing herniated disc with fragments on both sides, worse on the right - Pain is greater on the left side despite imaging findings - Epidural steroid injections performed: two on the right, one on the left - Current pain regimen includes Tylenol  650 mg three times daily, occasionally substituted with Excedrin or Motrin, Celebrex  twice daily, and gabapentin  in the morning and at bedtime - Oxycodone  used occasionally for severe pain  Sleep disturbance and nocturnal eating - Uses Ambien  for sleep - Experiences episodes of nighttime eating while on Ambien  and is considering discontinuation - Nighttime medications include gabapentin , Qulipta, and progesterone   Bone health - Osteopenia with high risk for osteoporosis due to prior steroid use - Takes estrogen and maintains adequate vitamin D   Hepatic steatosis - Diagnosed with hepatic steatosis - Awaiting fibrosis score, which may impact ongoing medication management  Review of Systems: Negative, with the exception of above mentioned in HPI.  History:   Reviewed by clinician on day of visit: allergies, medications, problem list, medical history, surgical history, family history, social history, and previous encounter notes.  Medications:   Show/hide medication list[1] Allergies[2]  Objective:   BP 112/78 (BP Location: Left Arm, Cuff Size: Normal)   Pulse 86   Ht 5' 3 (1.6 m)   Wt 164 lb (74.4 kg)   LMP 09/02/2013   SpO2 97%   BMI 29.05 kg/m   Physical Exam Constitutional:      General: She is not in acute  distress.    Appearance: She is well-developed.  HENT:     Head: Normocephalic and atraumatic.  Eyes:     Conjunctiva/sclera: Conjunctivae normal.  Cardiovascular:     Rate and Rhythm: Normal rate and regular rhythm.     Heart sounds: Normal heart sounds.  Pulmonary:     Effort: Pulmonary effort is normal.     Breath sounds: Normal breath sounds.  Neurological:     General: No focal deficit present.     Mental Status: She is alert.  Psychiatric:        Behavior: Behavior normal.    Results for orders placed or performed during the hospital encounter of 11/15/24  MYOCARDIAL PERFUSION IMAGING   Collection Time: 11/15/24 11:35 AM  Result Value Ref Range   Rest Nuclear Isotope Dose 10.4 mCi   Rest HR 69.0 bpm   Rest BP 111/81 mmHg   Peak HR 90 bpm   Peak BP 111/81 mmHg   Stress Nuclear Isotope Dose 31.9 mCi   SSS 0.0    SRS 0.0    SDS 0.0    TID 0.79    LV sys vol 16.0 3.8 - 5.2 mL   LV dias vol 60.0 46 - 106 mL   Nuc Stress EF 73 %   ST Depression (mm) 0 mm     Diagnoses and Orders:   1. Lumbar disc herniation with radiculopathy    Meds ordered this encounter  Medications   oxyCODONE  (ROXICODONE ) 5 MG immediate release tablet    Sig: Take 1 tablet (5 mg total) by mouth every 4 (  four) hours as needed for severe pain (pain score 7-10).    Dispense:  20 tablet    Refill:  0   No orders of the defined types were placed in this encounter.   Assessment & Plan:   Assessment and Plan Assessment & Plan Chronic low back pain with radiculopathy due to lumbar disc herniation Chronic low back pain with radiculopathy, more pronounced on the left. MRI shows bilateral herniation. Discussed surgical intervention versus conservative management. Concerns about insurance and preference for addressing both sides in one procedure. - Continue conservative management with injections, considering sublaminar approach. - Monitor insurance coverage and discuss surgical intervention if  conservative management fails.  Osteopenia, high risk for osteoporosis Osteopenia with high osteoporosis risk due to long-term steroid use. Vitamin D  levels adequate, ongoing estrogen therapy. FRAX score not high enough for immediate treatment. - Continue estrogen therapy and vitamin D  supplementation. - Monitor bone density and consider osteoporosis treatment if indicated.  Postmenopausal estrogen deficiency Managed with current hormone therapy. No bleeding reported. - Continue current hormone therapy regimen.  Hepatic steatosis Discussed obtaining FIB4 score for liver fibrosis risk. Insurance coverage for medications like Ozempic  uncertain. - Obtain FIB4 score to assess liver fibrosis risk. Fibrosis 4 Score = .87 (Low risk)  - Monitor insurance coverage for medications like Ozempic .   Immunocompromised state due to drug therapy Immunocompromised due to ongoing drug therapy. - Continue current medication regimen.  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 Discussed tapering Ambien  and increasing gabapentin  for pain and sleep management. - Taper off Ambien  by reducing to 5 mg before discontinuing. - Increase gabapentin  to 600 mg, one in the morning and two at night.  Peripheral artery disease of lower extremities (suspected) Suspected peripheral artery disease due to abnormal toe brachial index. Symptoms include cold feet and perfusion difficulty. Discussed potential Wegovy  indication. - Will message cardiologist to address abnormal toe brachial index and consider Wegovy  indication.  Overweight with body mass index (BMI) of 29 to 29.9 in adult Overweight with BMI of 29 to 29.9. Discussed weight management and potential use of medications like Ozempic , insurance coverage uncertain. - Monitor weight and consider weight management strategies. - Explore insurance coverage for weight management medications.  Geni Shutter, DO, MS, FAAFP, Dipl. [facility] [address] [place], [address] Dept: [phone] Dept Fax: [phone]  Attestations:   Reviewed by clinician on day of visit: allergies, medications, problem list, medical history, surgical history, family history, social history, and previous encounter notes. Discussed the use of AI scribe software for clinical note transcription with the patient, who gave verbal consent to proceed. As the patient's primary care physician, board-certified in Family Medicine and Obesity Medicine, I am providing ongoing, comprehensive obesity care based on the pillars of obesity medicine, including nutrition therapy, physical activity, behavioral modification, and pharmacologic treatment.     [1]  Outpatient Medications Prior to Visit  Medication Sig   acetaminophen  (TYLENOL ) 500 MG tablet Take 2 tablets (1,000 mg total) by mouth every 8 (eight) hours as needed for mild pain (pain score 1-3) or moderate pain (pain score 4-6).   albuterol  (VENTOLIN  HFA) 108 (90 Base) MCG/ACT inhaler Inhale 2 puffs into the lungs every 6 (six) hours as needed for wheezing or shortness of breath.   B Complex-C (SUPER B COMPLEX PO) Take 1 tablet by mouth every other day.   Biotin 89999 MCG TABS Take 10,000 mcg by mouth  daily.   buPROPion  (WELLBUTRIN  XL) 300 MG 24 hr tablet Take 1 tablet (300 mg total) by mouth daily.   celecoxib  (CELEBREX ) 200 MG capsule Take 1 capsule (200 mg total) by mouth 2 (two) times daily.   Cholecalciferol (VITAMIN D3) 125 MCG (5000 UT) CAPS Take 1 capsule by mouth daily.   cyclobenzaprine  (FLEXERIL ) 5 MG tablet Take 1 tablet (5 mg total) by mouth at bedtime as needed.   esomeprazole  (NEXIUM ) 40 MG capsule TAKE 1 CAPSULE DAILY   estradiol  (CLIMARA  - DOSED IN MG/24 HR) 0.025 mg/24hr patch Place 1 patch (0.025 mg total) onto the skin once a week.   famotidine  (PEPCID ) 40 MG tablet TAKE ONE TABLET BY MOUTH ONE  TIME DAILY   gabapentin  (NEURONTIN ) 300 MG capsule Take 1 capsule (300 mg total) by mouth 3 (three) times daily.   Lisdexamfetamine  Dimesylate 40 MG CHEW Chew 1 tablet (40 mg total) by mouth every morning.   ondansetron  (ZOFRAN ) 4 MG tablet Take 1 tablet (4 mg) by mouth as directed.   progesterone  (PROMETRIUM ) 100 MG capsule Take 1 capsule (100 mg total) by mouth daily.   QULIPTA 60 MG TABS daily.   RINVOQ  30 MG TB24 TAKE 1 TABLET BY MOUTH 1 TIME A DAY   simethicone  (MYLICON) 125 MG chewable tablet Chew 240 mg by mouth every 6 (six) hours as needed for flatulence.   zolpidem  (AMBIEN ) 10 MG tablet Take 1 tablet (10 mg total) by mouth at bedtime.   [DISCONTINUED] predniSONE  (DELTASONE ) 10 MG tablet Take 1 tablet (10 mg total) by mouth daily with breakfast. 6-5-4-3-2-1-off   VICTOZA  18 MG/3ML SOPN Inject 1.8 mg into the skin daily. (Patient not taking: Reported on 11/20/2024)   [DISCONTINUED] oxyCODONE  (ROXICODONE ) 5 MG immediate release tablet Take 1 tablet (5 mg total) by mouth every 4 (four) hours as needed for severe pain (pain score 7-10).   No facility-administered medications prior to visit.  [2]  Allergies Allergen Reactions   Tagamet [Cimetidine] Anaphylaxis   Effexor  [Venlafaxine ] Swelling   Mobic [Meloxicam] Swelling   Ultram  [Tramadol ] Swelling   "

## 2024-11-23 DIAGNOSIS — F5081 Binge eating disorder, mild: Secondary | ICD-10-CM

## 2024-11-26 MED ORDER — OXYCODONE HCL 5 MG PO TABS: 5.0000 mg | ORAL_TABLET | ORAL | 0 refills | Status: AC | PRN

## 2024-11-26 NOTE — Telephone Encounter (Signed)
 Please review patient message and advise. Thanks. Dm/cma

## 2024-11-27 ENCOUNTER — Encounter: Payer: Self-pay | Admitting: Family Medicine

## 2024-11-27 DIAGNOSIS — F5081 Binge eating disorder, mild: Secondary | ICD-10-CM

## 2024-11-27 MED ORDER — LISDEXAMFETAMINE DIMESYLATE 40 MG PO CHEW: 1.0000 | CHEWABLE_TABLET | Freq: Every morning | ORAL | 0 refills | Status: DC

## 2024-11-27 NOTE — Addendum Note (Signed)
 Addended by: PRENTISS FRIEZE R on: 11/27/2024 12:58 PM   Modules accepted: Orders

## 2024-11-27 NOTE — Telephone Encounter (Signed)
 Resolved 11/26/24.  Copied from CRM #[id number]. Topic: Clinical - Prescription Issue >> Nov 23, 2024  5:03 PM Ashley R wrote: Reason for CRM: Unable to see 12/23 AVS, states  oxyCODONE  (ROXICODONE ) 5 MG immediate release tablet   Was to be prescribed, but has not been sent to a pharmacy.  Callback or response to mychart message   ----------------------------------------------------------------------- From previous Reason for Contact - Medication Refill: Medication:   Has the patient contacted their pharmacy?   (Agent: If no, request that the patient contact the pharmacy for the refill. If patient does not wish to contact the pharmacy document the reason why and proceed with request.) (Agent: If yes, when and what did the pharmacy advise?)  This is the patient's preferred pharmacy:  [id number] [place], [place] - [address] [place] [address] Phone: [phone] Fax: [phone]  [facility] [place] [place] - [address] [place] [address] Phone: [phone] Fax: [phone]  Is this the correct pharmacy for this prescription?   If no, delete pharmacy and type the correct one.   Has the prescription been filled recently?    Is the patient out of the medication?    Has the patient been seen for an appointment in the last year OR does the patient have an upcoming appointment?    Can we respond through MyChart?    Agent: Please be advised that Rx refills may take up to 3 business days. We ask that you follow-up with your pharmacy.

## 2024-11-28 MED ORDER — LISDEXAMFETAMINE DIMESYLATE 40 MG PO CHEW: 1.0000 | CHEWABLE_TABLET | Freq: Every morning | ORAL | 0 refills | Status: DC

## 2024-12-03 ENCOUNTER — Telehealth: Payer: Self-pay

## 2024-12-03 ENCOUNTER — Other Ambulatory Visit (HOSPITAL_COMMUNITY): Payer: Self-pay

## 2024-12-03 NOTE — Telephone Encounter (Signed)
 Pharmacy Patient Advocate Encounter   Received notification from Patient Advice Request messages that prior authorization for Esomeprazole  Magnesium  40MG  dr capsules is required/requested.   Insurance verification completed.   The patient is insured through [facility].   Per test claim: PA required; PA started via CoverMyMeds. KEY [id number] . Waiting for clinical questions to populate.

## 2024-12-03 NOTE — Telephone Encounter (Signed)
 PA request has been Started. New Encounter has been or will be created for follow up. For additional info see Pharmacy Prior Auth telephone encounter from 12-03-2024.

## 2024-12-03 NOTE — Telephone Encounter (Signed)
 Pharmacy Patient Advocate Encounter   Received notification from Patient Advice Request messages that prior authorization for Rinvoq  30MG  er tablets is required/requested.   Insurance verification completed.   The patient is insured through [facility].   Per test claim: PA required; PA started via CoverMyMeds. KEY [id number] . Waiting for clinical questions to populate.

## 2024-12-04 ENCOUNTER — Other Ambulatory Visit (HOSPITAL_COMMUNITY): Payer: Self-pay

## 2024-12-04 NOTE — Telephone Encounter (Signed)
 Pharmacy Patient Advocate Encounter    Per test claim: PA required; PA submitted to above mentioned insurance via Latent Key/confirmation #/EOC [id number]  Status is pending

## 2024-12-04 NOTE — Telephone Encounter (Signed)
 Pharmacy Patient Advocate Encounter    Per test claim: PA required; PA submitted to above mentioned insurance via Latent Key/confirmation #/EOC [id number]  Status is pending

## 2024-12-05 ENCOUNTER — Encounter: Payer: Self-pay | Admitting: Pediatrics

## 2024-12-06 ENCOUNTER — Other Ambulatory Visit: Payer: Self-pay

## 2024-12-06 ENCOUNTER — Telehealth: Payer: Self-pay

## 2024-12-06 ENCOUNTER — Other Ambulatory Visit (HOSPITAL_COMMUNITY): Payer: Self-pay

## 2024-12-06 MED ORDER — ESOMEPRAZOLE MAGNESIUM 40 MG PO CPDR: 40.0000 mg | DELAYED_RELEASE_CAPSULE | Freq: Every day | ORAL | 0 refills | Status: DC

## 2024-12-06 MED ORDER — RINVOQ 30 MG PO TB24: 30.0000 mg | ORAL_TABLET | Freq: Every day | ORAL | 0 refills | Status: DC

## 2024-12-06 NOTE — Telephone Encounter (Signed)
 Pharmacy Patient Advocate Encounter  Received notification from [facility] that Prior Authorization for Rinvoq  30MG  er tablets has been APPROVED from 12-04-2024 to 12-04-2025. Ran test claim, Copay is [id number]**. This test claim was processed through [facility]- copay amounts may vary at other pharmacies due to pharmacy/plan contracts, or as the patient moves through the different stages of their insurance plan.  Patient signed up for savings card. Information updated into system. Co-pay with card applied with insurance is $0.00  PA #/Case ID/Reference #: [id number]

## 2024-12-06 NOTE — Telephone Encounter (Signed)
 See mychart from 12/05/24.

## 2024-12-06 NOTE — Telephone Encounter (Signed)
 Pharmacy Patient Advocate Encounter  Received notification from [facility] that Prior Authorization for Esomeprazole  Magnesium  40MG  dr capsules has been DENIED.  Full denial letter will be uploaded to the media tab. See denial reason below.  This health benefit plan does not cover the following services, supplies, drugs or charges: Any drug that is therapeutically equivalent to an over-the-counter drug where the over-the-counter products contain the same active ingredients as the prescription product at the same, or similar, strengths. - OR - Drugs that are purchased over-the-counter, unless specifically listed as a covered drug on the formulary  PA #/Case ID/Reference #: [id number]

## 2024-12-06 NOTE — Telephone Encounter (Signed)
 Below are what are listed on patient formulary and results of BIV  Cimetidine 300MG /5ML Soln - Patient has allergy to cimetidine Dexlansoprazole  30MG  and 60MG  Capsules- Previously tried and encounters from 2012 state well controlled but discontinued with no seen notes on why. Requires Prior Authorization Famotidine  40MG /5ML Susp- Patient already taking tablets Lansoprazole ODT 15MG  and 30MG  - Requires Prior Authorization Nizatidine 150MG  and 300MG  Capsules - 150MG  #30 per 30, co-pay $30.55 300MG  #30 per 30, co-pay $30.50 Pantoprazole  40MG  Tablet - Patient tried and stated did not help Rabeprazole  20MG  Tablet - #60 per 30, co-pay $5.00 Sucralfate 1GM Tablet - #30 per 30, co-pay $12.76

## 2024-12-06 NOTE — Telephone Encounter (Signed)
 Pharmacy Patient Advocate Encounter   Was asked by office and patient to see what alternative are covered that are listed on patient's formulary. Below are the results.   Cimetidine 300MG /5ML Soln - Patient has allergy to cimetidine Dexlansoprazole  30MG  and 60MG  Capsules- Previously tried and encounters from 2012 state well controlled but discontinued with no seen notes on why. Requires Prior Authorization Famotidine  40MG /5ML Susp- Patient already taking tablets Lansoprazole ODT 15MG  and 30MG  - Requires Prior Authorization Nizatidine 150MG  and 300MG  Capsules - 150MG  #30 per 30, co-pay $30.55 300MG  #30 per 30, co-pay $30.50 Pantoprazole  40MG  Tablet - Patient tried and stated did not help Rabeprazole  20MG  Tablet - #60 per 30, co-pay $5.00 Sucralfate 1GM Tablet - #30 per 30, co-pay $12.76

## 2024-12-07 ENCOUNTER — Other Ambulatory Visit: Payer: Self-pay

## 2024-12-07 ENCOUNTER — Other Ambulatory Visit (HOSPITAL_COMMUNITY): Payer: Self-pay

## 2024-12-07 MED ORDER — RABEPRAZOLE SODIUM 20 MG PO TBEC: 20.0000 mg | DELAYED_RELEASE_TABLET | Freq: Every day | ORAL | 3 refills | Status: AC

## 2024-12-07 MED ORDER — RINVOQ 30 MG PO TB24
30.0000 mg | ORAL_TABLET | Freq: Every day | ORAL | 0 refills | Status: AC
  Filled 2024-12-10: qty 30, 30d supply, fill #0
  Filled 2024-12-28: qty 30, 30d supply, fill #1

## 2024-12-07 NOTE — Telephone Encounter (Signed)
 Refer to fpl group 1/7. Script is already updated & pt is aware.

## 2024-12-07 NOTE — Telephone Encounter (Signed)
 Received a voicemail this am that the patient needs a prior auth for Renvoq.  The voicemail was from [facility] [phone]

## 2024-12-07 NOTE — Telephone Encounter (Signed)
 Inbound call from patient stating that she spoke with [facility] has requesting to have us  send a new prescription over to them so they could ran it and see it she is able to obtain her medication there instead. Patient is requesting a call back. Please advise.

## 2024-12-07 NOTE — Telephone Encounter (Signed)
 See encounter from 01-05. Rinvoq  prior authorization already approved

## 2024-12-10 ENCOUNTER — Other Ambulatory Visit: Payer: Self-pay

## 2024-12-10 ENCOUNTER — Other Ambulatory Visit (HOSPITAL_COMMUNITY): Payer: Self-pay

## 2024-12-10 NOTE — Progress Notes (Signed)
 Specialty Pharmacy Initial Fill Coordination Note  Melissa Cohen is a 59 y.o. female contacted today regarding initial fill of specialty medication(s) Upadacitinib  (Rinvoq )   Patient requested Delivery   Delivery date: 12/11/24   Verified address: [address]  [place], [place] [address]   Medication will be filled on: 12/10/24   Patient is aware of $0.00 copayment.

## 2024-12-10 NOTE — Progress Notes (Signed)
 Specialty Pharmacy Initiation Note   Melissa Cohen is a 59 y.o. female who will be followed by the specialty pharmacy service for RxSp Ulcerative Colitis    Review of administration, indication, effectiveness, safety, potential side effects, storage/disposable, and missed dose instructions occurred today for patient's specialty medication(s) Upadacitinib  (Rinvoq )     Patient/Caregiver did not have any additional questions or concerns.   Patient's therapy is appropriate to: Continue (Patient already established on therapy, transferring pharmacies.)    Goals Addressed             This Visit's Progress    Minimize recurrence of flares       Patient is on track. Patient will maintain adherence. Patient already established on therapy, transferring pharmacies. Patient reports occasional loose stools but may be due to what she eats. She states that she does not have any other signs of flares.          Allia Wiltsey M [facility] Specialty Pharmacist

## 2024-12-11 ENCOUNTER — Telehealth: Payer: Self-pay

## 2024-12-11 ENCOUNTER — Other Ambulatory Visit: Payer: Self-pay | Admitting: Internal Medicine

## 2024-12-11 ENCOUNTER — Other Ambulatory Visit (HOSPITAL_COMMUNITY): Payer: Self-pay

## 2024-12-11 DIAGNOSIS — G43009 Migraine without aura, not intractable, without status migrainosus: Secondary | ICD-10-CM

## 2024-12-11 DIAGNOSIS — M5116 Intervertebral disc disorders with radiculopathy, lumbar region: Secondary | ICD-10-CM

## 2024-12-11 MED ORDER — QULIPTA 60 MG PO TABS: 1.0000 | ORAL_TABLET | Freq: Every day | ORAL | 3 refills | Status: DC

## 2024-12-11 MED ORDER — LISDEXAMFETAMINE DIMESYLATE 40 MG PO CHEW: 1.0000 | CHEWABLE_TABLET | Freq: Every morning | ORAL | 0 refills | Status: AC

## 2024-12-11 NOTE — Telephone Encounter (Signed)
 Copied from CRM [id number]. Topic: Clinical - Medication Prior Auth >> Dec 11, 2024 10:05 AM Aleatha BROCKS wrote: Reason for CRM:  Patient needs a P/A for these QULIPTA  60 MG TABS and Lisdexamfetamine  Dimesylate 40 MG CHEW and send to [id number] [place] - [place], [place] - [address] AT [address] [place]. [place] [address] Phone: [phone] Fax: [phone] Hours: Not open 24 hours

## 2024-12-11 NOTE — Telephone Encounter (Signed)
 Pharmacy Patient Advocate Encounter   Received notification from Physician's Office that prior authorization for VYVANSE  CHEW is required/requested.   Insurance verification completed.   The patient is insured through [facility].   Per test claim: The current 30 day co-pay is, $98.2.  No PA needed at this time. This test claim was processed through [facility]- copay amounts may vary at other pharmacies due to pharmacy/plan contracts, or as the patient moves through the different stages of their insurance plan.

## 2024-12-11 NOTE — Addendum Note (Signed)
 Addended by: PRENTISS FRIEZE R on: 12/11/2024 04:58 PM   Modules accepted: Orders

## 2024-12-12 ENCOUNTER — Other Ambulatory Visit: Payer: Self-pay | Admitting: Pediatrics

## 2024-12-12 ENCOUNTER — Other Ambulatory Visit (HOSPITAL_COMMUNITY): Payer: Self-pay

## 2024-12-12 ENCOUNTER — Telehealth: Payer: Self-pay | Admitting: Pediatrics

## 2024-12-12 NOTE — Telephone Encounter (Signed)
 Inbound call from [facility] stating that [facility] [place] is needing for us  to send over a prior auth for patient medication Rinvoq . A good call back number for them would be [phone]. [facility] advise for us  to reach out directly to [place] at [phone]. Please advise.

## 2024-12-12 NOTE — Telephone Encounter (Signed)
 Hi there Melissa Cohen. Pt has reported that she is also needing a PA done on her Qulipta  60mg . Thank you!

## 2024-12-12 NOTE — Telephone Encounter (Signed)
 Patient is filling medication with [facility].

## 2024-12-13 NOTE — Telephone Encounter (Signed)
 Duplicate

## 2024-12-14 ENCOUNTER — Other Ambulatory Visit (HOSPITAL_COMMUNITY): Payer: Self-pay

## 2024-12-14 MED ORDER — QULIPTA 60 MG PO TABS: 1.0000 | ORAL_TABLET | Freq: Every day | ORAL | 3 refills | Status: AC

## 2024-12-14 NOTE — Telephone Encounter (Signed)
 Noted. This is being processed in a different message thread. Closing encounter.

## 2024-12-14 NOTE — Telephone Encounter (Signed)
 Pharmacy Patient Advocate Encounter   Received notification from Physician's Office that prior authorization for QULIPTA  is required/requested.   Insurance verification completed.   The patient is insured through [facility].   Per test claim: PA required; PA started via CoverMyMeds. KEY [id number] . Please see clinical question(s) below that I am not finding the answer to in their chart and advise. Please select the diagnosis the requested medication is being used to treat Episodic migraine (defined as less than 15 headache days per month) Chronic migraine (defined as greater than or equal to 15 headache days per month)

## 2024-12-14 NOTE — Telephone Encounter (Addendum)
 For PA submission we will need a specific ICD-10 code related to this medication. I can't submit an ICD-10 without a progress note to support it. I tried to search the care everywhere and found a note that mentioned migraines but still couldn't find a diagnosis code related to the Qulipta  that I could use on the PA request. Attaching an ICD-10 to the drug in Epic or the problem list would also be sufficient. Please advise.

## 2024-12-14 NOTE — Telephone Encounter (Signed)
 Copied from CRM [id number]. Topic: Clinical - Medication Prior Auth >> Dec 14, 2024  2:59 PM Charolett L wrote: Reason for CRM: [employer] just faxed over the PA form for the medication QULIPTA  60 MG TABS. She stated that it needs to be returned asap so that she can get her medication

## 2024-12-14 NOTE — Telephone Encounter (Signed)
 Also, see other 12/11/24 encounter.

## 2024-12-14 NOTE — Telephone Encounter (Signed)
 Can you clarify what diagnosis/ICD-10 code applies to this patient's Qulipta ?

## 2024-12-17 ENCOUNTER — Telehealth: Payer: Self-pay

## 2024-12-17 DIAGNOSIS — F39 Unspecified mood [affective] disorder: Secondary | ICD-10-CM

## 2024-12-17 DIAGNOSIS — M5116 Intervertebral disc disorders with radiculopathy, lumbar region: Secondary | ICD-10-CM

## 2024-12-17 NOTE — Telephone Encounter (Signed)
 Pharmacy Patient Advocate Encounter   Received notification from Physician's Office that prior authorization for QUILPTA is required/requested.   Insurance verification completed.   The patient is insured through [facility].   Per test claim: PA required; PA started via CoverMyMeds. KEY [id number] . Please see clinical question(s) below that I am not finding the answer to in their chart and advise.  Please select the diagnosis the requested medication is being used to treat -Episodic migraine (defined as less than 15 headache days per month) -Chronic migraine (defined as greater than or equal to 15 headache days per month)

## 2024-12-17 NOTE — Telephone Encounter (Signed)
 There is another open encounter where a PA is being attempted through [facility] by Ileana Lehmann, Rx Tech. They are needing Dr Prentiss to specify how many headache days pt has a month. I will closing this encounter and get the other routed to Dr Prentiss

## 2024-12-18 NOTE — Telephone Encounter (Signed)
 Copied from CRM [id number]. Topic: Clinical - Medication Prior Auth >> Dec 14, 2024  2:59 PM Charolett L wrote: Reason for CRM: [employer] just faxed over the PA form for the medication QULIPTA  60 MG TABS. She stated that it needs to be returned asap so that she can get her medication >> Dec 18, 2024 10:06 AM Tanazia G wrote: Patient is requesting a call back in regards to her most prior authorization for her Qulipta . Patient can be reached at [phone].

## 2024-12-18 NOTE — Telephone Encounter (Signed)
 I spoke with pt and notified her that the PA is being processed.

## 2024-12-19 NOTE — Telephone Encounter (Signed)
 Thank you! Can you also clarify on these: Over the last 3 months (or prior to starting therapy with the requested medication), has the patient experienced a minimum of 15 headache days per month?  Over the last 3 months (or prior to starting therapy with the requested medication), has the patient experienced a minimum of 8 migraine days per month?

## 2024-12-20 ENCOUNTER — Ambulatory Visit: Admitting: Family Medicine

## 2024-12-20 ENCOUNTER — Encounter: Payer: Self-pay | Admitting: Family Medicine

## 2024-12-20 VITALS — BP 104/62 | HR 67 | Ht 62.5 in | Wt 167.6 lb

## 2024-12-20 DIAGNOSIS — G43709 Chronic migraine without aura, not intractable, without status migrainosus: Secondary | ICD-10-CM

## 2024-12-20 DIAGNOSIS — Z92241 Personal history of systemic steroid therapy: Secondary | ICD-10-CM | POA: Diagnosis not present

## 2024-12-20 DIAGNOSIS — M5116 Intervertebral disc disorders with radiculopathy, lumbar region: Secondary | ICD-10-CM | POA: Diagnosis not present

## 2024-12-20 DIAGNOSIS — K760 Fatty (change of) liver, not elsewhere classified: Secondary | ICD-10-CM | POA: Diagnosis not present

## 2024-12-20 DIAGNOSIS — K51919 Ulcerative colitis, unspecified with unspecified complications: Secondary | ICD-10-CM | POA: Diagnosis not present

## 2024-12-20 DIAGNOSIS — Z780 Asymptomatic menopausal state: Secondary | ICD-10-CM | POA: Diagnosis not present

## 2024-12-20 DIAGNOSIS — E559 Vitamin D deficiency, unspecified: Secondary | ICD-10-CM

## 2024-12-20 DIAGNOSIS — R632 Polyphagia: Secondary | ICD-10-CM | POA: Diagnosis not present

## 2024-12-20 DIAGNOSIS — R635 Abnormal weight gain: Secondary | ICD-10-CM | POA: Diagnosis not present

## 2024-12-20 LAB — COMPREHENSIVE METABOLIC PANEL WITH GFR
ALT: 17 U/L (ref 3–35)
AST: 23 U/L (ref 5–37)
Albumin: 4.2 g/dL (ref 3.5–5.2)
Alkaline Phosphatase: 55 U/L (ref 39–117)
BUN: 22 mg/dL (ref 6–23)
CO2: 27 meq/L (ref 19–32)
Calcium: 9.2 mg/dL (ref 8.4–10.5)
Chloride: 105 meq/L (ref 96–112)
Creatinine, Ser: 0.84 mg/dL (ref 0.40–1.20)
GFR: 75.5 mL/min
Glucose, Bld: 85 mg/dL (ref 70–99)
Potassium: 3.9 meq/L (ref 3.5–5.1)
Sodium: 138 meq/L (ref 135–145)
Total Bilirubin: 0.4 mg/dL (ref 0.2–1.2)
Total Protein: 6.9 g/dL (ref 6.0–8.3)

## 2024-12-20 LAB — CBC WITH DIFFERENTIAL/PLATELET
Basophils Absolute: 0 K/uL (ref 0.0–0.1)
Basophils Relative: 0.5 % (ref 0.0–3.0)
Eosinophils Absolute: 0 K/uL (ref 0.0–0.7)
Eosinophils Relative: 0.8 % (ref 0.0–5.0)
HCT: 37.6 % (ref 36.0–46.0)
Hemoglobin: 13 g/dL (ref 12.0–15.0)
Lymphocytes Relative: 32.7 % (ref 12.0–46.0)
Lymphs Abs: 1.9 K/uL (ref 0.7–4.0)
MCHC: 34.5 g/dL (ref 30.0–36.0)
MCV: 91.7 fl (ref 78.0–100.0)
Monocytes Absolute: 0.5 K/uL (ref 0.1–1.0)
Monocytes Relative: 8.6 % (ref 3.0–12.0)
Neutro Abs: 3.3 K/uL (ref 1.4–7.7)
Neutrophils Relative %: 57.4 % (ref 43.0–77.0)
Platelets: 338 K/uL (ref 150.0–400.0)
RBC: 4.1 Mil/uL (ref 3.87–5.11)
RDW: 13.7 % (ref 11.5–15.5)
WBC: 5.8 K/uL (ref 4.0–10.5)

## 2024-12-20 LAB — LIPID PANEL
Cholesterol: 250 mg/dL — ABNORMAL HIGH (ref 28–200)
HDL: 74.9 mg/dL
LDL Cholesterol: 145 mg/dL — ABNORMAL HIGH (ref 10–99)
NonHDL: 175.32
Total CHOL/HDL Ratio: 3
Triglycerides: 153 mg/dL — ABNORMAL HIGH (ref 10.0–149.0)
VLDL: 30.6 mg/dL (ref 0.0–40.0)

## 2024-12-20 LAB — TSH: TSH: 0.83 u[IU]/mL (ref 0.35–5.50)

## 2024-12-20 LAB — HEMOGLOBIN A1C: Hgb A1c MFr Bld: 5.1 % (ref 4.6–6.5)

## 2024-12-20 LAB — VITAMIN D 25 HYDROXY (VIT D DEFICIENCY, FRACTURES): VITD: 45.91 ng/mL (ref 30.00–100.00)

## 2024-12-20 LAB — VITAMIN B12: Vitamin B-12: 371 pg/mL (ref 211–911)

## 2024-12-20 NOTE — Progress Notes (Signed)
 "    Patient Care Team: Prentiss Frieze, DO as PCP - General (Family Medicine) Anner Alm ORN, MD as PCP - Cardiology (Cardiology) Aneita Gwendlyn DASEN, MD (Inactive) as Consulting Physician (Gastroenterology) Sherryl Bouchard, NP as Consulting Physician (Neurology) Bluford Dorn Holts, MD as Consulting Physician (Dermatology) Anner Alm ORN, MD as Consulting Physician (Cardiology)  Weight Management:   Weight History:  Starting weight: 196 lbs.  Starting date: 04/08/2020.  Current weight: 167 lbs.  Weight change since last encounter: +3 lbs.  Weight change since first encounter: -29 lbs.  Total weight loss percentage: -14.80%.   Body mass index is 30.17 kg/m.   Nutrition Plan: Mindful Eating/ 80 g protein. Adherence to Nutrition Plan: poor. Current Anti-Obesity Medication, including off-label: Wellbutrin  300mg  1 tab PO QD, also supplements 1.8mg  Victoza  SQ QD.  Recent Physical Activity: None due to back pain.   Patient is under the care of an Obesity Medicine-certified physician providing longitudinal care using a comprehensive, pillar-based obesity treatment model (nutrition therapy, physical activity counseling, behavioral modification, pharmacotherapy when indicated, and medical monitoring), consistent with standards outlined by the Obesity Medicine Association. Obesity is treated as a medical disease, not a cosmetic condition. No contraindications identified (no personal or family history of medullary thyroid  carcinoma or MEN2; no history of pancreatitis). Weight, BMI, waist circumference, blood pressure, relevant metabolic markers (as applicable), medication tolerability, and adherence will be monitored at regular follow-up intervals. Discontinuation after initial response is associated with weight regain and disease progression. Ongoing therapy is medically appropriate for chronic disease management. Medication choice is supported by evidence demonstrating improvement in weight,  cardiometabolic risk, and obesity-related complications.   Frieze Prentiss, DO, MS (Nutrition), FAAFP, Dipl. ABOM Fellow, American Academy of Family Physicians Diplomate, American Board of Obesity Medicine [facility] [address] [place], [place] [address] Dept: [phone] Fax: [phone]  Diagnoses and Orders:   1. Weight gain   2. Metabolic dysfunction-associated steatotic liver disease (MASLD)   3. Lumbar disc herniation with radiculopathy   4. Ulcerative colitis with complication, unspecified location (HCC)   5. Polyphagia   6. Vitamin D  deficiency   7. Postmenopausal estrogen deficiency   8. History of systemic steroid therapy    Meds ordered this encounter  Medications   estradiol  (CLIMARA ) 0.0375 mg/24hr patch    Sig: Place 1 patch (0.0375 mg total) onto the skin once a week.    Dispense:  4 patch    Refill:  12   methylPREDNISolone  (MEDROL  DOSEPAK) 4 MG TBPK tablet    Sig: Take per package instructions.    Dispense:  21 tablet    Refill:  0   Orders Placed This Encounter  Procedures   CBC with Differential/Platelet   Comprehensive metabolic panel with GFR   Hemoglobin A1c   Lipid panel   TSH   VITAMIN D  25 Hydroxy (Vit-D Deficiency, Fractures)   Vitamin B12   Iron, TIBC and Ferritin Panel   Assessment & Plan:   Assessment & Plan Chronic migraine without aura Chronic migraines managed with Qulipta , gabapentin , and progesterone . Awaiting prior authorization for Qulipta  due to insurance issues. - Submitted prior authorization for Qulipta . - Utilize coupon for Qulipta  to reduce cost. - Continue current regimen of gabapentin , Qulipta , and progesterone .  Lumbar disc herniation with radiculopathy Chronic pain exacerbated by weight gain and physical activity. Current management includes oxycodone  and gabapentin . Considering back brace for support. - Continue oxycodone  at night as needed for pain. - Continue  gabapentin  for pain management. - Consider back brace for support after deductible is met.  Overweight Weight gain of 40 pounds over the past year and a half. Previous success with Ozempic . Challenges with insurance coverage for weight management medications. Considering Wegovy  and Ozempic . - Attempt to obtain Wegovy  for weight management. - Discussed potential need for peer review for Wegovy  approval.  Postmenopausal estrogen deficiency Managed with estradiol  patch. Current dose is the lowest available with no significant improvement. Plan to increase dose gradually. - Increased estradiol  patch dose to 0.0375 mg/24hr transdermal once a week. - Monitor for side effects and efficacy of increased dose.  Chronic insomnia Managed with oxycodone  and gabapentin . Current regimen effective. - Continue current regimen of oxycodone  at night and gabapentin  for sleep.  Hepatic steatosis Previous improvement with Ozempic .  Subjective:   History of Present Illness Melissa Cohen is a 59 year old female with chronic pain and obesity who presents for medication management and pain control.  Chronic pain and functional limitation - Severe chronic pain limits walking and standing at work. - Uses oxycodone  at night, gabapentin , Qulipta , and progesterone  with partial relief. - Utilizes heating pad and other nonpharmacologic measures for pain control.  Obesity and weight gain - Gained approximately 40 lb over the past 18 months, attributed to discontinuation of Ozempic . - Worsened knee pain and decreased mobility associated with weight gain. 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  Antidiabetic and weight management therapy - Previously experienced improvement in weight, hyperlipidemia, and fatty liver while on Ozempic . - Discontinued Wegovy  approximately 9 months ago. - Currently on Victoza , but finds it ineffective; increased dose  resulted in nausea and vomiting.  Hormone therapy - Taking progesterone  and estrogen patch for 6 to 7 weeks. - Progesterone  at night provides some benefit. - No perceived benefit from estrogen patch.  Migraine headaches - Migraines are controlled with Qulipta . - Migraines began after moving to [place] . - Initial treatment included amitriptyline.  Barriers to care - Frustration with insurance denials and high out-of-pocket costs for medications and procedures. - Delays in recommended treatments due to financial and insurance barriers.  Review of Systems: Negative, with the exception of above mentioned in HPI.  History:   Reviewed by clinician on day of visit: allergies, medications, problem list, medical history, surgical history, family history, social history, and previous encounter notes.  Medications:   Show/hide medication list[1] Allergies[2]  Objective:   BP 104/62 (BP Location: Left Arm, Cuff Size: Normal)   Pulse 67   Ht 5' 2.5 (1.588 m)   Wt 167 lb 9.6 oz (76 kg)   LMP 09/02/2013   SpO2 98%   BMI 30.17 kg/m   Physical Exam Constitutional:      General: She is not in acute distress.    Appearance: She is well-developed.  HENT:     Head: Normocephalic and atraumatic.  Eyes:     Conjunctiva/sclera: Conjunctivae normal.  Cardiovascular:     Rate and Rhythm: Normal rate and regular rhythm.     Heart sounds: Normal heart sounds.  Pulmonary:     Effort: Pulmonary effort is normal.     Breath sounds: Normal breath sounds.  Neurological:     General: No focal deficit present.     Mental Status: She is alert.  Psychiatric:        Behavior: Behavior normal.    Results for orders placed or performed in visit on 12/20/24  CBC  with Differential/Platelet   Collection Time: 12/20/24  8:59 AM  Result Value Ref Range   WBC 5.8 4.0 - 10.5 K/uL   RBC 4.10 3.87 - 5.11 Mil/uL   Hemoglobin 13.0 12.0 - 15.0 g/dL   HCT 62.3 63.9 - 53.9 %   MCV 91.7 78.0 - 100.0  fl   MCHC 34.5 30.0 - 36.0 g/dL   RDW 86.2 88.4 - 84.4 %   Platelets 338.0 150.0 - 400.0 K/uL   Neutrophils Relative % 57.4 43.0 - 77.0 %   Lymphocytes Relative 32.7 12.0 - 46.0 %   Monocytes Relative 8.6 3.0 - 12.0 %   Eosinophils Relative 0.8 0.0 - 5.0 %   Basophils Relative 0.5 0.0 - 3.0 %   Neutro Abs 3.3 1.4 - 7.7 K/uL   Lymphs Abs 1.9 0.7 - 4.0 K/uL   Monocytes Absolute 0.5 0.1 - 1.0 K/uL   Eosinophils Absolute 0.0 0.0 - 0.7 K/uL   Basophils Absolute 0.0 0.0 - 0.1 K/uL  Comprehensive metabolic panel with GFR   Collection Time: 12/20/24  8:59 AM  Result Value Ref Range   Sodium 138 135 - 145 mEq/L   Potassium 3.9 3.5 - 5.1 mEq/L   Chloride 105 96 - 112 mEq/L   CO2 27 19 - 32 mEq/L   Glucose, Bld 85 70 - 99 mg/dL   BUN 22 6 - 23 mg/dL   Creatinine, Ser 9.15 0.40 - 1.20 mg/dL   Total Bilirubin 0.4 0.2 - 1.2 mg/dL   Alkaline Phosphatase 55 39 - 117 U/L   AST 23 5 - 37 U/L   ALT 17 3 - 35 U/L   Total Protein 6.9 6.0 - 8.3 g/dL   Albumin 4.2 3.5 - 5.2 g/dL   GFR 24.49 >39.99 mL/min   Calcium  9.2 8.4 - 10.5 mg/dL  Hemoglobin J8r   Collection Time: 12/20/24  8:59 AM  Result Value Ref Range   Hgb A1c MFr Bld 5.1 4.6 - 6.5 %  Lipid panel   Collection Time: 12/20/24  8:59 AM  Result Value Ref Range   Cholesterol 250 (H) 28 - 200 mg/dL   Triglycerides 846.9 (H) 10.0 - 149.0 mg/dL   HDL 25.09 >60.99 mg/dL   VLDL 69.3 0.0 - 59.9 mg/dL   LDL Cholesterol 854 (H) 10 - 99 mg/dL   Total CHOL/HDL Ratio 3    NonHDL 175.32   TSH   Collection Time: 12/20/24  8:59 AM  Result Value Ref Range   TSH 0.83 0.35 - 5.50 uIU/mL  VITAMIN D  25 Hydroxy (Vit-D Deficiency, Fractures)   Collection Time: 12/20/24  8:59 AM  Result Value Ref Range   VITD 45.91 30.00 - 100.00 ng/mL  Vitamin B12   Collection Time: 12/20/24  8:59 AM  Result Value Ref Range   Vitamin B-12 371 211 - 911 pg/mL  Iron, TIBC and Ferritin Panel   Collection Time: 12/20/24  8:59 AM  Result Value Ref Range   Iron 130  45 - 160 mcg/dL   TIBC 552 749 - 549 mcg/dL (calc)   %SAT 29 16 - 45 % (calc)   Ferritin 20 16 - 232 ng/mL    The 10-year ASCVD risk score (Arnett DK, et al., 2019) is: 4.1%   Values used to calculate the score:     Age: 70 years     Clinically relevant sex: Female     Is Non-Hispanic African American: No     Diabetic: Yes     Tobacco smoker:  No     Systolic Blood Pressure: 104 mmHg     Is BP treated: No     HDL Cholesterol: 74.9 mg/dL     Total Cholesterol: 250 mg/dL  I personally spent a total of 43 minutes in the care of the patient today including preparing to see the patient, performing a medically appropriate exam/evaluation, counseling and educating, placing orders, and documenting clinical information in the EHR.    [1]  Outpatient Medications Prior to Visit  Medication Sig   acetaminophen  (TYLENOL ) 500 MG tablet Take 2 tablets (1,000 mg total) by mouth every 8 (eight) hours as needed for mild pain (pain score 1-3) or moderate pain (pain score 4-6).   albuterol  (VENTOLIN  HFA) 108 (90 Base) MCG/ACT inhaler Inhale 2 puffs into the lungs every 6 (six) hours as needed for wheezing or shortness of breath.   B Complex-C (SUPER B COMPLEX PO) Take 1 tablet by mouth every other day.   Biotin 89999 MCG TABS Take 10,000 mcg by mouth daily.   celecoxib  (CELEBREX ) 200 MG capsule Take 1 capsule (200 mg total) by mouth 2 (two) times daily.   Cholecalciferol (VITAMIN D3) 125 MCG (5000 UT) CAPS Take 1 capsule by mouth daily.   cyclobenzaprine  (FLEXERIL ) 5 MG tablet Take 1 tablet (5 mg total) by mouth at bedtime as needed.   famotidine  (PEPCID ) 40 MG tablet TAKE ONE TABLET BY MOUTH ONE TIME DAILY   Lisdexamfetamine  Dimesylate 40 MG CHEW Chew 1 tablet (40 mg total) by mouth every morning.   ondansetron  (ZOFRAN ) 4 MG tablet Take 1 tablet (4 mg) by mouth as directed.   oxyCODONE  (ROXICODONE ) 5 MG immediate release tablet Take 1 tablet (5 mg total) by mouth every 4 (four) hours as needed for  severe pain (pain score 7-10).   progesterone  (PROMETRIUM ) 100 MG capsule Take 1 capsule (100 mg total) by mouth daily.   QULIPTA  60 MG TABS Take 1 tablet (60 mg total) by mouth daily.   RABEprazole  (ACIPHEX ) 20 MG tablet Take 1 tablet (20 mg total) by mouth daily.   simethicone  (MYLICON) 125 MG chewable tablet Chew 240 mg by mouth every 6 (six) hours as needed for flatulence.   Upadacitinib  ER (RINVOQ ) 30 MG TB24 Take 1 tablet (30 mg total) by mouth daily.   VICTOZA  18 MG/3ML SOPN Inject 1.8 mg into the skin daily.   zolpidem  (AMBIEN ) 10 MG tablet Take 1 tablet (10 mg total) by mouth at bedtime.   [DISCONTINUED] buPROPion  (WELLBUTRIN  XL) 300 MG 24 hr tablet Take 1 tablet (300 mg total) by mouth daily.   [DISCONTINUED] estradiol  (CLIMARA  - DOSED IN MG/24 HR) 0.025 mg/24hr patch Place 1 patch (0.025 mg total) onto the skin once a week.   [DISCONTINUED] gabapentin  (NEURONTIN ) 300 MG capsule Take 1 capsule (300 mg total) by mouth 3 (three) times daily.   No facility-administered medications prior to visit.  [2]  Allergies Allergen Reactions   Tagamet [Cimetidine] Anaphylaxis   Effexor  [Venlafaxine ] Swelling   Mobic [Meloxicam] Swelling   Ultram  [Tramadol ] Swelling   "

## 2024-12-20 NOTE — Telephone Encounter (Signed)
 Pt came in today for an OV follow up. I asked her this question. Prior to starting this requested medication, pt would experience a minimum of 15 headache days per month. Often She could have a continuous migraine for 30+ days at a time. She has tried Topiramate and Amitriptyline in the past and has failed both. They were both ineffective. Pt does not have auras when she has a migraine but they can be classified as intractable. This information has been looked over and approved/confirmed by Dr Prentiss

## 2024-12-20 NOTE — Telephone Encounter (Signed)
 Amazing! Thank you so much. I apologize for all the clarification questions. Her plan in particular is very detailed on criteria requirements and I want to make sure I've done everything I can to assist with approval. We really appreciate your support.

## 2024-12-21 ENCOUNTER — Ambulatory Visit (INDEPENDENT_AMBULATORY_CARE_PROVIDER_SITE_OTHER): Payer: Self-pay

## 2024-12-21 LAB — IRON,TIBC AND FERRITIN PANEL
%SAT: 29 % (ref 16–45)
Ferritin: 20 ng/mL (ref 16–232)
Iron: 130 ug/dL (ref 45–160)
TIBC: 447 ug/dL (ref 250–450)

## 2024-12-21 NOTE — Telephone Encounter (Signed)
 Pharmacy Patient Advocate Encounter  Received notification from [facility] that Prior Authorization for QULIPTA  has been APPROVED from 12/20/24 to 12/20/25

## 2024-12-22 MED ORDER — ESTRADIOL 0.0375 MG/24HR TD PTWK: 0.0375 mg | MEDICATED_PATCH | TRANSDERMAL | 12 refills | Status: AC

## 2024-12-22 MED ORDER — METHYLPREDNISOLONE 4 MG PO TBPK: ORAL_TABLET | ORAL | 0 refills | Status: AC

## 2024-12-22 NOTE — Telephone Encounter (Signed)
 Estrogen patch and Medrol  dose pack sent.

## 2024-12-24 NOTE — Telephone Encounter (Signed)
 I have loaded the Gabepentin to send to pts pharmacy.  Refill request received for Gabapentin  332m FOV:01/22/25 LOV:12/20/24 Last refill:03/2024 Medication is pending your approval.

## 2024-12-24 NOTE — Addendum Note (Signed)
 Addended by: Jackee Glasner on: 12/24/2024 02:48 PM   Modules accepted: Orders

## 2024-12-25 MED ORDER — GABAPENTIN 300 MG PO CAPS: 300.0000 mg | ORAL_CAPSULE | Freq: Three times a day (TID) | ORAL | 1 refills | Status: AC

## 2024-12-25 MED ORDER — BUPROPION HCL ER (XL) 300 MG PO TB24: 300.0000 mg | ORAL_TABLET | Freq: Every day | ORAL | 1 refills | Status: AC

## 2024-12-25 NOTE — Addendum Note (Signed)
 Addended by: Vienne Corcoran on: 12/25/2024 04:26 PM   Modules accepted: Orders

## 2024-12-25 NOTE — Addendum Note (Signed)
 Addended by: PRENTISS FRIEZE R on: 12/25/2024 08:50 AM   Modules accepted: Orders

## 2024-12-25 NOTE — Telephone Encounter (Signed)
 Per Dr Prentiss verbal orders, okay to send in refill on Bupropion . Med sent

## 2024-12-28 ENCOUNTER — Encounter: Payer: Self-pay | Admitting: Family Medicine

## 2024-12-28 ENCOUNTER — Other Ambulatory Visit: Payer: Self-pay

## 2024-12-28 DIAGNOSIS — G43709 Chronic migraine without aura, not intractable, without status migrainosus: Secondary | ICD-10-CM | POA: Insufficient documentation

## 2024-12-28 NOTE — Progress Notes (Signed)
 Specialty Pharmacy Refill Coordination Note  Melissa Cohen is a 59 y.o. female contacted today regarding refills of specialty medication(s) Upadacitinib  (Rinvoq )   Patient requested Delivery   Delivery date: 01/04/25   Verified address: [address]  [place], [place] [address]   Medication will be filled on: 01/03/25

## 2025-01-15 ENCOUNTER — Ambulatory Visit: Payer: Self-pay | Admitting: General Practice

## 2025-01-22 ENCOUNTER — Ambulatory Visit: Admitting: Family Medicine

## 2025-02-19 ENCOUNTER — Ambulatory Visit: Admitting: Family Medicine
